# Patient Record
Sex: Male | Born: 1950
Health system: Southern US, Community
[De-identification: ages and names within clinical notes are randomized; demographics above are authoritative.]

## PROBLEM LIST (undated history)

## (undated) DIAGNOSIS — C779 Secondary and unspecified malignant neoplasm of lymph node, unspecified: Secondary | ICD-10-CM

## (undated) DIAGNOSIS — Z923 Personal history of irradiation: Secondary | ICD-10-CM

## (undated) DIAGNOSIS — K5903 Drug induced constipation: Secondary | ICD-10-CM

## (undated) DIAGNOSIS — E039 Hypothyroidism, unspecified: Secondary | ICD-10-CM

## (undated) DIAGNOSIS — C01 Malignant neoplasm of base of tongue: Secondary | ICD-10-CM

## (undated) DIAGNOSIS — G4700 Insomnia, unspecified: Principal | ICD-10-CM

## (undated) DIAGNOSIS — F17200 Nicotine dependence, unspecified, uncomplicated: Secondary | ICD-10-CM

## (undated) DIAGNOSIS — I10 Essential (primary) hypertension: Secondary | ICD-10-CM

## (undated) DIAGNOSIS — B370 Candidal stomatitis: Secondary | ICD-10-CM

## (undated) DIAGNOSIS — B3781 Candidal esophagitis: Secondary | ICD-10-CM

## (undated) DIAGNOSIS — C069 Malignant neoplasm of mouth, unspecified: Secondary | ICD-10-CM

## (undated) DIAGNOSIS — I890 Lymphedema, not elsewhere classified: Principal | ICD-10-CM

## (undated) HISTORY — DX: Candidal stomatitis: B37.0

## (undated) HISTORY — DX: Candidal esophagitis: B37.81

## (undated) HISTORY — PX: MANDIBLE SURGERY: SHX707

## (undated) HISTORY — DX: Lymphedema, not elsewhere classified: I89.0

## (undated) HISTORY — DX: Malignant neoplasm of base of tongue: C01

## (undated) HISTORY — DX: Malignant neoplasm of mouth, unspecified: C06.9

## (undated) HISTORY — PX: APPENDECTOMY: SHX54

## (undated) HISTORY — PX: TESTICLE SURGERY: SHX794

## (undated) HISTORY — DX: Hypomagnesemia: E83.42

## (undated) HISTORY — DX: Insomnia, unspecified: G47.00

## (undated) HISTORY — PX: OTHER SURGICAL HISTORY: SHX169

## (undated) HISTORY — PX: BACK SURGERY: SHX140

## (undated) HISTORY — PX: TONSILLECTOMY: SUR1361

## (undated) HISTORY — DX: Secondary and unspecified malignant neoplasm of lymph node, unspecified: C77.9

---

## 2013-09-02 ENCOUNTER — Telehealth: Payer: Self-pay | Admitting: *Deleted

## 2013-09-02 NOTE — Telephone Encounter (Signed)
Patient called wanting address for appointment on next Tuesday for orange card. Provided patient with address. Alverda Skeans, RN

## 2013-09-07 ENCOUNTER — Ambulatory Visit: Payer: Medicaid Other | Attending: Internal Medicine

## 2013-09-13 ENCOUNTER — Ambulatory Visit: Payer: Medicaid Other | Attending: Internal Medicine

## 2013-09-17 ENCOUNTER — Encounter (HOSPITAL_COMMUNITY): Payer: Self-pay | Admitting: Emergency Medicine

## 2013-09-17 ENCOUNTER — Emergency Department (HOSPITAL_COMMUNITY): Payer: Medicaid Other

## 2013-09-17 ENCOUNTER — Emergency Department (HOSPITAL_COMMUNITY)
Admission: EM | Admit: 2013-09-17 | Discharge: 2013-09-17 | Disposition: A | Discharge status: Home / Self Care | Payer: Medicaid Other | Attending: Emergency Medicine | Admitting: Emergency Medicine

## 2013-09-17 ENCOUNTER — Ambulatory Visit: Payer: Self-pay | Attending: Internal Medicine | Admitting: Internal Medicine

## 2013-09-17 VITALS — BP 148/90 | HR 78 | Temp 99.2°F | Resp 16 | Wt 115.8 lb

## 2013-09-17 DIAGNOSIS — F17200 Nicotine dependence, unspecified, uncomplicated: Secondary | ICD-10-CM | POA: Insufficient documentation

## 2013-09-17 DIAGNOSIS — C01 Malignant neoplasm of base of tongue: Secondary | ICD-10-CM | POA: Insufficient documentation

## 2013-09-17 DIAGNOSIS — R220 Localized swelling, mass and lump, head: Secondary | ICD-10-CM | POA: Insufficient documentation

## 2013-09-17 DIAGNOSIS — R221 Localized swelling, mass and lump, neck: Secondary | ICD-10-CM

## 2013-09-17 DIAGNOSIS — D4989 Neoplasm of unspecified behavior of other specified sites: Secondary | ICD-10-CM | POA: Insufficient documentation

## 2013-09-17 DIAGNOSIS — C760 Malignant neoplasm of head, face and neck: Secondary | ICD-10-CM

## 2013-09-17 LAB — BASIC METABOLIC PANEL
BUN: 14 mg/dL (ref 6–23)
CO2: 25 mEq/L (ref 19–32)
Calcium: 9.2 mg/dL (ref 8.4–10.5)
Chloride: 101 mEq/L (ref 96–112)
Creatinine, Ser: 0.66 mg/dL (ref 0.50–1.35)
GFR calc Af Amer: >90 mL/min (ref >90)
GFR calc non Af Amer: >90 mL/min (ref >90)
Glucose, Bld: 121 mg/dL — ABNORMAL HIGH (ref 70–99)
Potassium: 4.2 mEq/L (ref 3.7–5.3)
Sodium: 137 mEq/L (ref 137–147)

## 2013-09-17 LAB — CBC WITH DIFFERENTIAL/PLATELET
Basophils Absolute: 0.1 10*3/uL (ref 0.0–0.1)
Basophils Relative: 1 % (ref 0–1)
Eosinophils Absolute: 0.4 10*3/uL (ref 0.0–0.7)
Eosinophils Relative: 4 % (ref 0–5)
HCT: 37.9 % — ABNORMAL LOW (ref 39.0–52.0)
Hemoglobin: 12.5 g/dL — ABNORMAL LOW (ref 13.0–17.0)
Lymphocytes Relative: 17 % (ref 12–46)
Lymphs Abs: 1.7 10*3/uL (ref 0.7–4.0)
MCH: 33.3 pg (ref 26.0–34.0)
MCHC: 33 g/dL (ref 30.0–36.0)
MCV: 101.1 fL — ABNORMAL HIGH (ref 78.0–100.0)
Monocytes Absolute: 0.8 10*3/uL (ref 0.1–1.0)
Monocytes Relative: 8 % (ref 3–12)
Neutro Abs: 7.3 10*3/uL (ref 1.7–7.7)
Neutrophils Relative %: 70 % (ref 43–77)
Platelets: 425 10*3/uL — ABNORMAL HIGH (ref 150–400)
RBC: 3.75 MIL/uL — ABNORMAL LOW (ref 4.22–5.81)
RDW: 16 % — ABNORMAL HIGH (ref 11.5–15.5)
WBC: 10.3 10*3/uL (ref 4.0–10.5)

## 2013-09-17 MED ORDER — SODIUM CHLORIDE 0.9 % IV SOLN
INTRAVENOUS | Status: DC
  Administered 2013-09-17: 12:00:00 via INTRAVENOUS

## 2013-09-17 MED ORDER — HYDROCODONE-ACETAMINOPHEN 5-325 MG PO TABS: 1.0000 | ORAL_TABLET | ORAL | Status: DC | PRN

## 2013-09-17 MED ORDER — MORPHINE SULFATE 4 MG/ML IJ SOLN
6.0000 mg | Freq: Once | INTRAMUSCULAR | Status: AC
  Administered 2013-09-17: 6 mg via INTRAVENOUS
  Filled 2013-09-17: qty 2

## 2013-09-17 MED ORDER — IOHEXOL 300 MG/ML  SOLN
100.0000 mL | Freq: Once | INTRAMUSCULAR | Status: AC | PRN
  Administered 2013-09-17: 100 mL via INTRAVENOUS

## 2013-09-17 MED ORDER — ONDANSETRON HCL 4 MG/2ML IJ SOLN
4.0000 mg | Freq: Once | INTRAMUSCULAR | Status: AC
  Administered 2013-09-17: 4 mg via INTRAVENOUS
  Filled 2013-09-17: qty 2

## 2013-09-17 NOTE — Progress Notes (Signed)
Patient here to establish care Complains of having a growth to the left side of his neck  That started about 2 months ago and has gotten progressively worse Also complains of having a smaller lump to the right side of his neck

## 2013-09-17 NOTE — Progress Notes (Signed)
Patient ID: Angel [name], male   DOB: Apr 13, 1951, 63 y.o.   MRN: [id number]   Angel [name], is a 63 y.o. male  DVV:[id number]  GYI:[id number]  DOB - [birth year]  CC:  Chief Complaint  Patient presents with  . Establish Care       HPI: Angel [name] is a 63 y.o. male here today to establish medical care. His major complaint today is left-sided neck mass which he noticed first 3 months ago, has been getting progressively larger since then and fast growing in the last 2 weeks. It does not hurt to the touch. Is getting large enough to the point that he began having some mass effect and causing more discomfort. For the past 2 weeks he states that had a sore throat. Sometimes he feels like his saliva gets hung up when he tries to swallow. No difficulty breathing. No stridor. He has smoked cigarettes heavily since age 59, currently smoking about 1 pack per day. No alcohol use. No tongue pain. No fevers or chills. No night sweats. He denies weight loss today he has always been around 120 pounds all his life.  Patient has No headache, No chest pain, No abdominal pain - No Nausea, No new weakness tingling or numbness, No Cough - SOB.  No Known Allergies History reviewed. No pertinent past medical history. No current outpatient prescriptions on file prior to visit.   No current facility-administered medications on file prior to visit.   History reviewed. No pertinent family history. History   Social History  . Marital Status: Legally Separated    Spouse Name: N/A    Number of Children: N/A  . Years of Education: N/A   Occupational History  . Not on file.   Social History Main Topics  . Smoking status: Heavy Tobacco Smoker  . Smokeless tobacco: Not on file  . Alcohol Use: No  . Drug Use: Not on file  . Sexual Activity: Not on file   Other Topics Concern  . Not on file   Social History Narrative  . No narrative on file    Review of Systems: Constitutional: Negative for fever,  chills, diaphoresis, activity change, appetite change and fatigue. HENT: Negative for ear pain, nosebleeds, congestion, positive for neck pain and neck swelling as described above  Eyes: Negative for pain, discharge, redness, itching and visual disturbance. Respiratory: Negative for cough, choking, chest tightness, shortness of breath, wheezing and stridor.  Cardiovascular: Negative for chest pain, palpitations and leg swelling. Gastrointestinal: Negative for abdominal distention. Genitourinary: Negative for dysuria, urgency, frequency, hematuria, flank pain, decreased urine volume, difficulty urinating and dyspareunia.  Musculoskeletal: Negative for back pain, joint swelling, arthralgia and gait problem. Neurological: Negative for dizziness, tremors, seizures, syncope, facial asymmetry, speech difficulty, weakness, light-headedness, numbness and headaches.  Hematological: Negative for adenopathy. Does not bruise/bleed easily. Psychiatric/Behavioral: Negative for hallucinations, behavioral problems, confusion, dysphoric mood, decreased concentration and agitation.    Objective:   Filed Vitals:   09/17/13 1037  BP: 148/90  Pulse: 78  Temp: 99.2 F (37.3 C)  Resp: 16    Physical Exam: Constitutional: Patient appears well-developed and well-nourished. No distress. Eyes: Conjunctivae and EOM are normal. PERRLA, no scleral icterus.  Neck: There is a huge L lateral neck mass about 8 x 10 cm. Below level of mandible. Not attached to skin, Mostly firm to touch, there is a small area nonhealing ulcer/abrasion in the lower lateral part, fluctuant. Nontender. No drainage. No surrounding erythema. Smaller firm and nontender mass in  same area on R side. Overlying scar which pt reports is from previous mandible fx. Posterior pharynx clear. Uvula midline. Handling secretions. No intraoral lesions noted. No stridor. Normal sounding phonation.   CVS: RRR, S1/S2 +, no murmurs, no gallops, no carotid  bruit.  Pulmonary: Effort and breath sounds normal, no stridor, rhonchi, wheezes, rales.  Abdominal: Soft. BS +, no distension, tenderness, rebound or guarding.  Musculoskeletal: Normal range of motion. No edema and no tenderness.  Lymphadenopathy: No lymphadenopathy noted, cervical, inguinal or axillary Neuro: Alert. Normal reflexes, muscle tone coordination. No cranial nerve deficit. Skin: Skin is warm and dry. No rash noted. Not diaphoretic. No erythema. No pallor. Psychiatric: Normal mood and affect. Behavior, judgment, thought content normal.  Lab Results  Component Value Date   WBC 10.3 09/17/2013   HGB 12.5* 09/17/2013   HCT 37.9* 09/17/2013   MCV 101.1* 09/17/2013   PLT 425* 09/17/2013   Lab Results  Component Value Date   CREATININE 0.66 09/17/2013   BUN 14 09/17/2013   NA 137 09/17/2013   K 4.2 09/17/2013   CL 101 09/17/2013   CO2 25 09/17/2013    No results found for this basename: HGBA1C   Lipid Panel  No results found for this basename: chol, trig, hdl, cholhdl, vldl, ldlcalc       Assessment and plan:   1. Head and neck malignancy Patient has no insurance and no financial means of paying co-pay, so referral to surgery for biopsy is difficult at the moment Patient needs imaging studies to characterize the neck mass, with the patient to the emergency department for immediate imaging studies, and possible consult to ENT surgery or general surgery for possible biopsy or at least a scheduled outpatient follow up with the specialist for biopsy  Patient was counseled extensively about smoking cessation  Return if symptoms worsen or fail to improve, for Follow up Pain and comorbidities.  The patient was given clear instructions to go to ER or return to medical center if symptoms don't improve, worsen or new problems develop. The patient verbalized understanding. The patient was told to call to get lab results if they haven't heard anything in the next week.     This note has  been created with Surveyor, quantity. Any transcriptional errors are unintentional.    Angelica Chessman, MD, MHA, [place], [place], [place]   09/17/2013, 4:56 PM

## 2013-09-17 NOTE — ED Notes (Signed)
MD at bedside. 

## 2013-09-17 NOTE — Discharge Instructions (Signed)
Cancer of the Tongue Cancer of the tongue occurs when a group of cells on the tongue become abnormal and start to grow out of control. Most of the time, tongue cancer starts in very thin, flat cells that cover the surface of your tongue (squamous cells). Cancer cells can spread and form a mass of cells called a tumor. The tumor may spread deeper into the tongue, or it may spread to other areas of the body (metastasize). RISK FACTORS The exact cause of cancer of the tongue is not known. However, some risk factors make this more likely:  Use of tobacco products, including cigarettes, pipes, cigars, smokeless (chewing) tobacco, and snuff. This is the number one risk factor of cancer of the tongue.  Male gender.  Age of 50 years or older.  Poor oral hygiene (not brushing or flossing your teeth regularly).  Frequent use of alcohol.  Human papillomavirus (HPV) infection.  Family history of tongue cancer. SYMPTOMS Tongue cancer can start in 1 of 2 places. It can start at the front part of the tongue or at the base of the tongue at the back of your throat.Symptoms of cancer at the front part of your tongue may include:  A lump or sore on your tongue that may be painful (especially when you eat or speak) and may not heal. It also may bleed easily if you bite it or touch it.  Numbness on your tongue.  Difficulty moving your tongue.  Pain when chewing.  Difficulty pronouncing or saying certain words or making certain sounds.  A bad odor in your mouth.  A lump on your neck. Cancer at the base of the tongue is harder to see. Symptoms may not show up as soon as they do for cancer at the front part of your tongue. Symptoms may include:  A feeling in your throat that you are choking (especially when you are lying down).  Difficulty swallowing or pain when you swallow.  A muffled voice.  Ear pain.  Pain when you try to open your  mouth or difficulty opening your mouth.  A lump on your neck. DIAGNOSIS To diagnose tongue cancer, your caregiver may perform the following exams:  A physical exam of your mouth, throat, and neck for a sore or lump. Your caregiver may use a mirror with a long handle or a thin, flexible tube with a tiny light and camera at the end (fiberscope) to see the back of your mouth.  Removal and exam of a small number of cells (biopsy) from your tongue or a lump on your neck. The cells are checked for cancerous formations under a microscope.  Imaging exams, such as X-rays of your mouth and neck. The images can show if there is an abnormal mass. If you do have cancer, your caregiver will stage your cancer. Staging provides an idea of how advanced your cancer is. The stage will depend on how much your cancer has grown and if it has metastasized. The meaning of the stage depends on the type of cancer. For tongue cancer:  Stage I means the cancer is the size of a peanut or smaller. It has not metastasized.  Stage II means the cancer is larger than a peanut, but not larger than a walnut. It has not metastasized.  Stage III means the cancer has grown larger than a walnut. It may have spread to a lymph node or lymph gland on the same side of your neck as the cancer. (Lymph is a   fluid that carries white blood cells all over your body. White blood cells fight infection.)  Stage IV means the cancer has spread to nearby areas. It may have spread heavily into your lymph glands. TREATMENT Treatment for tongue cancer can vary. It will depend on the stage and location of your cancer and your overall health. Treatment options may include:  Radiation therapy. This uses waves of nuclear energy to kill cancer cells on your tongue. It may be used for stage I and II cancers. It often is used for cancers at the base of your tongue.  Surgery:  Surgery is done if your tumor is small, has not spread, and is at the front of  your tongue.  Surgery may be done to remove tumors that have spread into your neck or lymph nodes.  A combination of surgery, radiation, and drugs that kills cancer cells (chemotherapy). This may be done for stage III and stage IV cancers. SEEK MEDICAL CARE IF:  Your tongue hurts or is numb.  The way you speak changes.  The way you swallow changes.  You notice a lump on your neck.  You have an oral temperature above 100.5 F (38.1 C). SEEK IMMEDIATE MEDICAL CARE IF:   You have pain that gets worse.  Your tongue or mouth bleeds.  Your lip, mouth, or neck swells.  You have trouble swallowing.  You have trouble breathing.  You have an oral temperature above 102 F (38.9 C). Document Released: 07/31/2010 Document Revised: 07/08/2011 Document Reviewed: 07/31/2010 Exit[employer] Patient Information 2014 [employer].  

## 2013-09-17 NOTE — ED Notes (Signed)
Per pt sts he has had a growth on the left side of his neck since February. sts it is not painful. sts he feels tired and head is heavy at night. sts his throat has also been very sore. sts some occasional trouble swallowing.

## 2013-09-17 NOTE — ED Provider Notes (Signed)
CSN: [id number]     Arrival date & time 09/17/13  1115 History   First MD Initiated Contact with Patient 09/17/13 1129     Chief Complaint  Patient presents with  . Sore Throat  . Oral Swelling     (Consider location/radiation/quality/duration/timing/severity/associated sxs/prior Treatment) HPI  63 year old male with left-sided neck mass. First dose in February. Has been getting progressively larger since then. Does not hurt to the touch. Is getting large enough to the point that this began having some mass effect and causing more discomfort though. For the past 2 weeks he states that had a sore throat. Sometimes he feels like his saliva gets hung up when he tries to swallow. No difficulty breathing. No stridor. Does smoke cigarettes. No alcohol use.  No tongue pain. No fevers or chills. No night sweats. No weight-loss.  History reviewed. No pertinent past medical history. History reviewed. No pertinent past surgical history. History reviewed. No pertinent family history. History  Substance Use Topics  . Smoking status: Heavy Tobacco Smoker  . Smokeless tobacco: Not on file  . Alcohol Use: No    Review of Systems  All systems reviewed and negative, other than as noted in HPI.   Allergies  Review of patient's allergies indicates no known allergies.  Home Medications   Prior to Admission medications   Medication Sig Start Date End Date Taking? Authorizing Provider  acetaminophen (TYLENOL) 500 MG tablet Take 1,000 mg by mouth 3 (three) times daily as needed (for pain).   Yes Historical Provider, MD   BP 140/81  Pulse 67  Temp(Src) 98.1 F (36.7 C) (Oral)  Resp 16  Wt 115 lb 9.6 oz (52.436 kg)  SpO2 100% Physical Exam  Nursing note and vitals reviewed. Constitutional: He appears well-developed and well-nourished. No distress.  HENT:  Head:    Large L lateral neck mass. Below level of mandible. Mostly firm to touch, but small area inferior/posterior aspect somewhat  fluctuant. Nontender. No drainage. No surrounding cellulitis. Smaller firm and nontender mass in same area on R side. Overlying scar which pt reports is from previous mandible fx. Posterior pharynx clear. Uvula midline. Handling secretions. No intraoral lesions noted. No stridor. Normal sounding phonation.  Eyes: Conjunctivae are normal. Right eye exhibits no discharge. Left eye exhibits no discharge.  Neck: Neck supple.  Cardiovascular: Normal rate, regular rhythm and normal heart sounds.  Exam reveals no gallop and no friction rub.   No murmur heard. Pulmonary/Chest: Effort normal and breath sounds normal. No respiratory distress.  Abdominal: Soft. He exhibits no distension. There is no tenderness.  Musculoskeletal: He exhibits no edema and no tenderness.  Neurological: He is alert.  Skin: Skin is warm and dry.  Psychiatric: He has a normal mood and affect. His behavior is normal. Thought content normal.    ED Course  Procedures (including critical care time) Labs Review Labs Reviewed  CBC WITH DIFFERENTIAL - Abnormal; Notable for the following:    RBC 3.75 (*)    Hemoglobin 12.5 (*)    HCT 37.9 (*)    MCV 101.1 (*)    RDW 16.0 (*)    Platelets 425 (*)    All other components within normal limits  BASIC METABOLIC PANEL - Abnormal; Notable for the following:    Glucose, Bld 121 (*)    All other components within normal limits    Imaging Review Ct Soft Tissue Neck W Contrast  09/17/2013   CLINICAL DATA:  Left neck mass.  EXAM: CT  NECK WITH CONTRAST  TECHNIQUE: Multidetector CT imaging of the neck was performed using the standard protocol following the bolus administration of intravenous contrast.  CONTRAST:  128mL OMNIPAQUE IOHEXOL 300 MG/ML  SOLN  COMPARISON:  None.  FINDINGS: The visualized portion of the brain demonstrates cerebral atrophy orbits are unremarkable there is trace right maxillary sinus mucosal thickening. Mastoid air cells are clear.  There is a large, centrally  necrotic mass deep to the left sternocleidomastoid spanning levels II and III and measuring 6.6 x 5.5 x 6.2 cm (AP by transverse by craniocaudal). Finding is compatible with a necrotic nodal mass. Necrotic left level II lymph node superior to this dominant mass measures 2.5 x 2.2 cm. Necrotic right level II lymph nodes measure up to 2.2 x 1.9 cm in size. Right level III lymph nodes measure up to 6 mm and short axis. A mildly asymmetric left supraclavicular lymph node measures 6 mm.  There is a large enhancing mass in the tongue base measuring 3.7 x 3.5 x 2.9 cm (transverse by AP by craniocaudal). Mass extends into the vallecula and superior aspect of the pre epiglottic space.  The thyroid is unremarkable. There is prominent, diffuse enhancement of the salivary glands and the lymphoid tissue of the neck. No submandibular or parotid mass is identified. Visualized lung apices demonstrate very mild scarring as well as a 3 mm nodule in the apical right upper lobe (series 4, image 98). There is moderate to advanced multilevel degenerative disc disease in the cervical spine. Moderate facet arthrosis is present on the left at C3-4.  IMPRESSION: Large tongue base malignancy with necrotic, bulky metastatic cervical lymphadenopathy, left greater than right.   Electronically Signed   By: Logan Bores   On: 09/17/2013 14:42     EKG Interpretation None      MDM   Final diagnoses:  Cancer of base of tongue    63 year old male with a large left lateral neck mass. Consider abscess. Long smoking hx. Consider malignancy. Interestingly, pt also has a R sided neck mass which he reports he's had since childhood, although increased in size recently. Will CT to further evaluate.   CT as above. Will discuss with ENT.   Discussed with Dr. Redmond Baseman, ENT. Will see patient in close followup. Patient is without any respiratory complaints. No objective evidence of airway compromise. I feel he is appropriate for outpatient followup  at this time. Emergent return precautions were discussed. Provided a prescription for as needed pain medication.  Virgel Manifold, MD 09/17/13 [phone]

## 2013-09-17 NOTE — ED Notes (Signed)
Pt has large swollen growth on L neck located under L ear. States it has been growing in size for about 2.5 months. States he has small bump on R neck that has "been there as long as I can remember, I'm not sure if it's related". Pt states he has had sore throat x 3 weeks and occasional trouble swallowing.

## 2013-09-23 ENCOUNTER — Other Ambulatory Visit (HOSPITAL_COMMUNITY)
Admission: RE | Admit: 2013-09-23 | Discharge: 2013-09-23 | Disposition: A | Discharge status: Home / Self Care | Payer: Medicaid Other | Source: Ambulatory Visit | Attending: Otolaryngology | Admitting: Otolaryngology

## 2013-09-23 ENCOUNTER — Other Ambulatory Visit: Payer: Self-pay | Admitting: Otolaryngology

## 2013-09-23 DIAGNOSIS — C760 Malignant neoplasm of head, face and neck: Secondary | ICD-10-CM | POA: Insufficient documentation

## 2013-09-28 NOTE — Pre-Procedure Instructions (Signed)
Angel [name]  09/28/2013   Your procedure is scheduled on:  Monday October 04, 2013 at 11:00 AM.  Report to [facility] Admitting at 9:00 AM.  Call this number if you have problems the morning of surgery: [phone]   Remember:   Do not eat food or drink liquids after midnight.   Take these medicines the morning of surgery with A SIP OF WATER: Acetaminophen (Tylenol) if needed, Hydrocodone if needed   Do not wear jewelry.  Do not wear lotions, powders, or cologne.   Men may shave face and neck.  Do not bring valuables to the hospital.  [facility] is not responsible for any belongings or valuables.               Contacts, dentures or bridgework may not be worn into surgery.  Leave suitcase in the car. After surgery it may be brought to your room.  For patients admitted to the hospital, discharge time is determined by your treatment team.               Patients discharged the day of surgery will not be allowed to drive home.  Name and phone number of your driver: Family/Friend  Special Instructions: Shower the night before and the morning of your surgery using CHG soap   Please read over the following fact sheets that you were given: Pain Booklet, Coughing and Deep Breathing and Surgical Site Infection Prevention

## 2013-09-29 ENCOUNTER — Encounter (HOSPITAL_COMMUNITY): Payer: Self-pay

## 2013-09-29 ENCOUNTER — Encounter (HOSPITAL_COMMUNITY)
Admission: RE | Admit: 2013-09-29 | Discharge: 2013-09-29 | Disposition: A | Discharge status: Home / Self Care | Payer: Medicaid Other | Source: Ambulatory Visit | Attending: Otolaryngology | Admitting: Otolaryngology

## 2013-09-29 DIAGNOSIS — Z01812 Encounter for preprocedural laboratory examination: Secondary | ICD-10-CM | POA: Insufficient documentation

## 2013-09-29 HISTORY — DX: Nicotine dependence, unspecified, uncomplicated: F17.200

## 2013-09-29 HISTORY — DX: Drug induced constipation: K59.03

## 2013-09-29 LAB — COMPREHENSIVE METABOLIC PANEL
ALT: 6 U/L (ref 0–53)
AST: 11 U/L (ref 0–37)
Albumin: 3.4 g/dL — ABNORMAL LOW (ref 3.5–5.2)
Alkaline Phosphatase: 70 U/L (ref 39–117)
BUN: 12 mg/dL (ref 6–23)
CO2: 27 mEq/L (ref 19–32)
CREATININE: 0.64 mg/dL (ref 0.50–1.35)
Calcium: 9.8 mg/dL (ref 8.4–10.5)
Chloride: 98 mEq/L (ref 96–112)
GFR calc Af Amer: >90 mL/min (ref >90)
GFR calc non Af Amer: >90 mL/min (ref >90)
Glucose, Bld: 111 mg/dL — ABNORMAL HIGH (ref 70–99)
Potassium: 5.1 mEq/L (ref 3.7–5.3)
Sodium: 134 mEq/L — ABNORMAL LOW (ref 137–147)
TOTAL PROTEIN: 7.3 g/dL (ref 6.0–8.3)
Total Bilirubin: <0.2 mg/dL — ABNORMAL LOW (ref 0.3–1.2)

## 2013-09-29 LAB — CBC
HCT: 37 % — ABNORMAL LOW (ref 39.0–52.0)
Hemoglobin: 12.6 g/dL — ABNORMAL LOW (ref 13.0–17.0)
MCH: 34.1 pg — AB (ref 26.0–34.0)
MCHC: 34.1 g/dL (ref 30.0–36.0)
MCV: 100.3 fL — ABNORMAL HIGH (ref 78.0–100.0)
Platelets: 505 10*3/uL — ABNORMAL HIGH (ref 150–400)
RBC: 3.69 MIL/uL — ABNORMAL LOW (ref 4.22–5.81)
RDW: 16.1 % — ABNORMAL HIGH (ref 11.5–15.5)
WBC: 16.6 10*3/uL — ABNORMAL HIGH (ref 4.0–10.5)

## 2013-09-29 NOTE — Progress Notes (Signed)
Patient informed Nurse that he was last seen by his PCP approximately 5 years ago. Patient informed Nurse that he smoked 2 PPD but has cut it down to 2-3 cigarettes a day. Patient denied having any cardiac, pulmonary, chewing, or swallowing difficulties.
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
## 2013-10-01 ENCOUNTER — Telehealth: Payer: Self-pay | Admitting: Hematology and Oncology

## 2013-10-01 ENCOUNTER — Other Ambulatory Visit (HOSPITAL_COMMUNITY): Payer: Self-pay | Admitting: Otolaryngology

## 2013-10-01 NOTE — Telephone Encounter (Signed)
S/W PATIENT AND GAVE NP APPT FOR 06/09 @ 3 W/DR. [place].  REFERRING DR. Orpah Greek BATES DX- TONGUE BASE CANCER/LEFT NECK SCC WELCOME PACKET MAILED.

## 2013-10-04 ENCOUNTER — Encounter (HOSPITAL_COMMUNITY): Payer: Medicaid Other | Admitting: Vascular Surgery

## 2013-10-04 ENCOUNTER — Encounter (HOSPITAL_COMMUNITY): Payer: Self-pay | Admitting: *Deleted

## 2013-10-04 ENCOUNTER — Ambulatory Visit: Payer: Self-pay

## 2013-10-04 ENCOUNTER — Ambulatory Visit (HOSPITAL_COMMUNITY): Payer: Medicaid Other | Admitting: Anesthesiology

## 2013-10-04 ENCOUNTER — Ambulatory Visit (HOSPITAL_COMMUNITY)
Admission: RE | Admit: 2013-10-04 | Discharge: 2013-10-04 | Disposition: A | Discharge status: Home / Self Care | Payer: Medicaid Other | Source: Ambulatory Visit | Attending: Otolaryngology | Admitting: Otolaryngology

## 2013-10-04 ENCOUNTER — Ambulatory Visit: Payer: Self-pay | Admitting: Hematology and Oncology

## 2013-10-04 ENCOUNTER — Encounter (HOSPITAL_COMMUNITY)
Admission: RE | Disposition: A | Discharge status: Home / Self Care | Payer: Self-pay | Source: Ambulatory Visit | Attending: Otolaryngology

## 2013-10-04 DIAGNOSIS — C50919 Malignant neoplasm of unspecified site of unspecified female breast: Secondary | ICD-10-CM | POA: Diagnosis not present

## 2013-10-04 DIAGNOSIS — C01 Malignant neoplasm of base of tongue: Secondary | ICD-10-CM | POA: Diagnosis not present

## 2013-10-04 DIAGNOSIS — F17200 Nicotine dependence, unspecified, uncomplicated: Secondary | ICD-10-CM | POA: Diagnosis not present

## 2013-10-04 DIAGNOSIS — C069 Malignant neoplasm of mouth, unspecified: Secondary | ICD-10-CM

## 2013-10-04 DIAGNOSIS — C760 Malignant neoplasm of head, face and neck: Secondary | ICD-10-CM

## 2013-10-04 DIAGNOSIS — C779 Secondary and unspecified malignant neoplasm of lymph node, unspecified: Secondary | ICD-10-CM

## 2013-10-04 HISTORY — PX: PANENDOSCOPY: SHX2159

## 2013-10-04 HISTORY — DX: Malignant neoplasm of mouth, unspecified: C06.9

## 2013-10-04 HISTORY — PX: ESOPHAGOSCOPY: SHX5534

## 2013-10-04 SURGERY — LARYNGOSCOPY, WITH BRONCHOSCOPY AND ESOPHAGOSCOPY
Anesthesia: General | Site: Mouth

## 2013-10-04 MED ORDER — 0.9 % SODIUM CHLORIDE (POUR BTL) OPTIME
TOPICAL | Status: DC | PRN
  Administered 2013-10-04: 1000 mL

## 2013-10-04 MED ORDER — FENTANYL CITRATE 0.05 MG/ML IJ SOLN
25.0000 ug | INTRAMUSCULAR | Status: DC | PRN
  Administered 2013-10-04 (×2): 50 ug via INTRAVENOUS

## 2013-10-04 MED ORDER — CEFAZOLIN SODIUM-DEXTROSE 2-3 GM-% IV SOLR
2.0000 g | Freq: Once | INTRAVENOUS | Status: AC
  Administered 2013-10-04: 2 g via INTRAVENOUS
  Filled 2013-10-04: qty 50

## 2013-10-04 MED ORDER — METOCLOPRAMIDE HCL 5 MG/ML IJ SOLN: 10.0000 mg | Freq: Once | INTRAMUSCULAR | Status: DC | PRN

## 2013-10-04 MED ORDER — PROPOFOL 10 MG/ML IV BOLUS
INTRAVENOUS | Status: AC
  Filled 2013-10-04: qty 20

## 2013-10-04 MED ORDER — ROCURONIUM BROMIDE 50 MG/5ML IV SOLN
INTRAVENOUS | Status: AC
  Filled 2013-10-04: qty 1

## 2013-10-04 MED ORDER — MIDAZOLAM HCL 2 MG/2ML IJ SOLN
INTRAMUSCULAR | Status: AC
  Filled 2013-10-04: qty 2

## 2013-10-04 MED ORDER — PROPOFOL 10 MG/ML IV BOLUS
INTRAVENOUS | Status: DC | PRN
  Administered 2013-10-04: 160 mg via INTRAVENOUS
  Administered 2013-10-04: 40 mg via INTRAVENOUS

## 2013-10-04 MED ORDER — LACTATED RINGERS IV SOLN
INTRAVENOUS | Status: DC
  Administered 2013-10-04: 09:00:00 via INTRAVENOUS

## 2013-10-04 MED ORDER — ARTIFICIAL TEARS OP OINT
TOPICAL_OINTMENT | OPHTHALMIC | Status: AC
  Filled 2013-10-04: qty 3.5

## 2013-10-04 MED ORDER — OXYCODONE HCL 5 MG PO TABS
5.0000 mg | ORAL_TABLET | Freq: Once | ORAL | Status: AC | PRN
  Administered 2013-10-04: 5 mg via ORAL

## 2013-10-04 MED ORDER — DEXAMETHASONE SODIUM PHOSPHATE 4 MG/ML IJ SOLN
INTRAMUSCULAR | Status: DC | PRN
  Administered 2013-10-04: 4 mg via INTRAVENOUS

## 2013-10-04 MED ORDER — LIDOCAINE HCL (CARDIAC) 20 MG/ML IV SOLN
INTRAVENOUS | Status: DC | PRN
  Administered 2013-10-04: 160 mg via INTRAVENOUS

## 2013-10-04 MED ORDER — SUCCINYLCHOLINE CHLORIDE 20 MG/ML IJ SOLN
INTRAMUSCULAR | Status: DC | PRN
  Administered 2013-10-04: 40 mg via INTRAVENOUS
  Administered 2013-10-04: 100 mg via INTRAVENOUS

## 2013-10-04 MED ORDER — LIDOCAINE HCL (CARDIAC) 20 MG/ML IV SOLN
INTRAVENOUS | Status: AC
  Filled 2013-10-04: qty 5

## 2013-10-04 MED ORDER — ONDANSETRON HCL 4 MG/2ML IJ SOLN
INTRAMUSCULAR | Status: DC | PRN
Start: 2013-10-04 — End: 2013-10-04
  Administered 2013-10-04: 4 mg via INTRAVENOUS

## 2013-10-04 MED ORDER — FENTANYL CITRATE 0.05 MG/ML IJ SOLN
INTRAMUSCULAR | Status: AC
  Filled 2013-10-04: qty 2

## 2013-10-04 MED ORDER — DEXAMETHASONE SODIUM PHOSPHATE 4 MG/ML IJ SOLN
INTRAMUSCULAR | Status: AC
  Filled 2013-10-04: qty 1

## 2013-10-04 MED ORDER — HYDROCODONE-ACETAMINOPHEN 5-325 MG PO TABS: 1.0000 | ORAL_TABLET | Freq: Four times a day (QID) | ORAL | Status: DC | PRN

## 2013-10-04 MED ORDER — FENTANYL CITRATE 0.05 MG/ML IJ SOLN
INTRAMUSCULAR | Status: AC
  Filled 2013-10-04: qty 5

## 2013-10-04 MED ORDER — OXYCODONE HCL 5 MG PO TABS
ORAL_TABLET | ORAL | Status: AC
  Filled 2013-10-04: qty 1

## 2013-10-04 MED ORDER — MIDAZOLAM HCL 5 MG/5ML IJ SOLN
INTRAMUSCULAR | Status: DC | PRN
  Administered 2013-10-04: 2 mg via INTRAVENOUS

## 2013-10-04 MED ORDER — OXYCODONE HCL 5 MG/5ML PO SOLN: 5.0000 mg | Freq: Once | ORAL | Status: AC | PRN

## 2013-10-04 MED ORDER — FENTANYL CITRATE 0.05 MG/ML IJ SOLN
INTRAMUSCULAR | Status: DC | PRN
  Administered 2013-10-04: 50 ug via INTRAVENOUS

## 2013-10-04 MED ORDER — ONDANSETRON HCL 4 MG/2ML IJ SOLN
INTRAMUSCULAR | Status: AC
  Filled 2013-10-04: qty 2

## 2013-10-04 SURGICAL SUPPLY — 32 items
BLADE 10 SAFETY STRL DISP (BLADE) ×4 IMPLANT
CANISTER SUCTION 2500CC (MISCELLANEOUS) ×4 IMPLANT
CONT SPEC 4OZ CLIKSEAL STRL BL (MISCELLANEOUS) IMPLANT
COVER MAYO STAND STRL (DRAPES) ×2 IMPLANT
COVER TABLE BACK 60X90 (DRAPES) ×4 IMPLANT
CRADLE DONUT ADULT HEAD (MISCELLANEOUS) IMPLANT
DRAPE PROXIMA HALF (DRAPES) ×4 IMPLANT
DRESSING TELFA 8X3 (GAUZE/BANDAGES/DRESSINGS) ×4 IMPLANT
DRSG TELFA 3X8 NADH (GAUZE/BANDAGES/DRESSINGS) ×4 IMPLANT
GAUZE SPONGE 4X4 16PLY XRAY LF (GAUZE/BANDAGES/DRESSINGS) ×2 IMPLANT
GLOVE BIO SURGEON STRL SZ7.5 (GLOVE) ×4 IMPLANT
GLOVE BIOGEL PI IND STRL 7.0 (GLOVE) IMPLANT
GLOVE BIOGEL PI INDICATOR 7.0 (GLOVE) ×2
GLOVE SURG SS PI 7.0 STRL IVOR (GLOVE) ×2 IMPLANT
GOWN STRL REUS W/ TWL LRG LVL3 (GOWN DISPOSABLE) ×2 IMPLANT
GOWN STRL REUS W/TWL LRG LVL3 (GOWN DISPOSABLE) ×4
KIT ROOM TURNOVER OR (KITS) ×4 IMPLANT
MARKER SKIN DUAL TIP RULER LAB (MISCELLANEOUS) IMPLANT
NDL 18GX1X1/2 (RX/OR ONLY) (NEEDLE) ×2 IMPLANT
NEEDLE 18GX1X1/2 (RX/OR ONLY) (NEEDLE) ×4 IMPLANT
NS IRRIG 1000ML POUR BTL (IV SOLUTION) ×4 IMPLANT
PAD ARMBOARD 7.5X6 YLW CONV (MISCELLANEOUS) ×8 IMPLANT
PAD DRESSING TELFA 3X8 NADH (GAUZE/BANDAGES/DRESSINGS) IMPLANT
PATTIES SURGICAL .5 X1 (DISPOSABLE) IMPLANT
SOLUTION ANTI FOG 6CC (MISCELLANEOUS) ×4 IMPLANT
SPECIMEN JAR SMALL (MISCELLANEOUS) IMPLANT
SPONGE GAUZE 4X4 12PLY (GAUZE/BANDAGES/DRESSINGS) ×4 IMPLANT
SURGILUBE 2OZ TUBE FLIPTOP (MISCELLANEOUS) IMPLANT
TOWEL OR 17X24 6PK STRL BLUE (TOWEL DISPOSABLE) ×8 IMPLANT
TUBE CONNECTING 12'X1/4 (SUCTIONS) ×1
TUBE CONNECTING 12X1/4 (SUCTIONS) ×3 IMPLANT
WATER STERILE IRR 1000ML POUR (IV SOLUTION) ×4 IMPLANT

## 2013-10-04 NOTE — Transfer of Care (Signed)
Immediate Anesthesia Transfer of Care Note  Patient: Angel [name]  Procedure(s) Performed: Procedure(s): Direct Laryngoscopy  WITH BIOPSY (N/A) ESOPHAGOSCOPY  Patient Location: PACU  Anesthesia Type:General  Level of Consciousness: awake, alert  and oriented  Airway & Oxygen Therapy: Patient Spontanous Breathing and Patient connected to nasal cannula oxygen  Post-op Assessment: Report given to PACU RN  Post vital signs: Reviewed and stable  Complications: No apparent anesthesia complications

## 2013-10-04 NOTE — Anesthesia Procedure Notes (Signed)
Procedure Name: Intubation Date/Time: 10/04/2013 11:43 AM Performed by: Barrington Ellison Pre-anesthesia Checklist: Patient identified, Emergency Drugs available, Suction available, Patient being monitored and Timeout performed Patient Re-evaluated:Patient Re-evaluated prior to inductionOxygen Delivery Method: Circle system utilized Preoxygenation: Pre-oxygenation with 100% oxygen Intubation Type: IV induction and Rapid sequence Ventilation: Mask ventilation without difficulty Laryngoscope Size: Mac Grade View: Grade I Tube type: Oral Tube size: 7.0 mm Number of attempts: 2 Airway Equipment and Method: Video-laryngoscopy and Stylet Placement Confirmation: ETT inserted through vocal cords under direct vision,  positive ETCO2 and breath sounds checked- equal and bilateral Secured at: 22 cm Tube secured with: Tape Dental Injury: Teeth and Oropharynx as per pre-operative assessment  Comments: DL x1 with Glide Scope by CRNA and Dr. Albertina Parr with no view of epiglottis due to tumor. Dr. Redmond Baseman DL x 1 with Glide, Grade 1 view.

## 2013-10-04 NOTE — Brief Op Note (Signed)
10/04/2013  11:56 AM  PATIENT:  Angel [name]  63 y.o. male  PRE-OPERATIVE DIAGNOSIS:  TONGUE BASE CANCER, NECK METASTASES  POST-OPERATIVE DIAGNOSIS:  SAME  PROCEDURE:  DIRECT LARYNGOSCOPY WITH BIOPSY, ESOPHAGOSCOPY  SURGEON:  Surgeon(s) and Role:    * Melida Quitter, MD - Primary  PHYSICIAN ASSISTANT:   ASSISTANTS: none   ANESTHESIA:   general  EBL:     BLOOD ADMINISTERED:none  DRAINS: none   LOCAL MEDICATIONS USED:  NONE  SPECIMEN:  Source of Specimen:  Left tongue base, vallecula  DISPOSITION OF SPECIMEN:  PATHOLOGY  COUNTS:  YES  TOURNIQUET:  * No tourniquets in log *  DICTATION: .Other Dictation: Dictation Number [id number]  PLAN OF CARE: Discharge to home after PACU  PATIENT DISPOSITION:  PACU - hemodynamically stable.   Delay start of Pharmacological VTE agent (>24hrs) due to surgical blood loss or risk of bleeding: no

## 2013-10-04 NOTE — Anesthesia Preprocedure Evaluation (Addendum)
Anesthesia Evaluation  Patient identified by MRN, date of birth, ID band Patient awake    Reviewed: Allergy & Precautions, H&P , NPO status , Patient's Chart, lab work & pertinent test results, reviewed documented beta blocker date and time   Airway Mallampati: II TM Distance: >3 FB Neck ROM: full    Dental  (+) Partial Upper, Partial Lower, Dental Advisory Given, Poor Dentition   Pulmonary Current Smoker,  breath sounds clear to auscultation        Cardiovascular negative cardio ROS  Rhythm:regular     Neuro/Psych negative neurological ROS  negative psych ROS   GI/Hepatic negative GI ROS, Neg liver ROS,   Endo/Other  negative endocrine ROS  Renal/GU negative Renal ROS  negative genitourinary   Musculoskeletal   Abdominal   Peds  Hematology negative hematology ROS (+)   Anesthesia Other Findings See surgeon's H&P   Reproductive/Obstetrics negative OB ROS                          Anesthesia Physical Anesthesia Plan  ASA: III  Anesthesia Plan: General   Post-op Pain Management:    Induction: Intravenous  Airway Management Planned: Video Laryngoscope Planned and Oral ETT  Additional Equipment:   Intra-op Plan:   Post-operative Plan: Extubation in OR  Informed Consent: I have reviewed the patients History and Physical, chart, labs and discussed the procedure including the risks, benefits and alternatives for the proposed anesthesia with the patient or authorized representative who has indicated his/her understanding and acceptance.   Dental Advisory Given  Plan Discussed with: CRNA and Surgeon  Anesthesia Plan Comments:         Anesthesia Quick Evaluation

## 2013-10-04 NOTE — Discharge Instructions (Signed)

## 2013-10-04 NOTE — Anesthesia Postprocedure Evaluation (Signed)
Anesthesia Post Note  Patient: Angel [name]  Procedure(s) Performed: Procedure(s) (LRB): Direct Laryngoscopy  WITH BIOPSY (N/A) ESOPHAGOSCOPY  Anesthesia type: General  Patient location: PACU  Post pain: Pain level controlled  Post assessment: Patient's Cardiovascular Status Stable  Last Vitals:  Filed Vitals:   10/04/13 1246  BP: 128/64  Pulse: 79  Temp:   Resp: 17    Post vital signs: Reviewed and stable  Level of consciousness: alert  Complications: No apparent anesthesia complications

## 2013-10-04 NOTE — H&P (Signed)
Angel [name] is an 63 y.o. male.   Chief Complaint: neck mass, tongue base cancer HPI: 63 year old male with growing left neck mass for at least the past couple of months.  FNA shows carcinoma and imaging demonstrates nodal masses in both sides of the neck and a tongue base mass.  Presents for surgical biopsy.  Past Medical History  Diagnosis Date  . Constipation due to pain medication   . Cancer     left neck lymph node  . Current smoker     Past Surgical History  Procedure Laterality Date  . Testicle surgery      as a infant  . Appendectomy    . Mandible surgery      from [place]  . Back surgery      Lumbar  . Tonsillectomy      History reviewed. No pertinent family history. Social History:  reports that he has been smoking Cigarettes.  He has a 90 pack-year smoking history. He does not have any smokeless tobacco history on file. He reports that he uses illicit drugs (Marijuana). He reports that he does not drink alcohol.  Allergies: No Known Allergies  Medications Prior to Admission  Medication Sig Dispense Refill  . HYDROcodone-acetaminophen (NORCO/VICODIN) 5-325 MG per tablet Take 1 tablet by mouth every 4 (four) hours as needed.  20 tablet  0  . acetaminophen (TYLENOL) 500 MG tablet Take 1,000 mg by mouth 3 (three) times daily as needed (for pain).        No results found for this or any previous visit (from the past 48 hour(s)). No results found.  Review of Systems  HENT: Positive for sore throat.   All other systems reviewed and are negative.   Blood pressure 131/85, pulse 91, temperature 97.7 F (36.5 C), temperature source Oral, resp. rate 18, weight 53.071 kg (117 lb), SpO2 99.00%. Physical Exam  Constitutional: He is oriented to person, place, and time. He appears well-developed and well-nourished. No distress.  HENT:  Head: Normocephalic and atraumatic.  Right Ear: External ear normal.  Left Ear: External ear normal.  Nose: Nose normal.   Mouth/Throat: Oropharynx is clear and moist.  Eyes: Conjunctivae and EOM are normal. Pupils are equal, round, and reactive to light.  Neck: Normal range of motion. Neck supple.  Large, firm left neck mass, smaller right zone 2 mass.  Cardiovascular: Normal rate.   Respiratory: Effort normal.  Musculoskeletal: Normal range of motion.  Neurological: He is alert and oriented to person, place, and time. No cranial nerve deficit.  Skin: Skin is warm and dry.  Psychiatric: He has a normal mood and affect. His behavior is normal. Judgment and thought content normal.     Assessment/Plan Tongue base carcinoma, neck metastases To OR for DL with biopsy and esophagoscopy.  Melida Quitter 10/04/2013, 11:25 AM

## 2013-10-05 ENCOUNTER — Encounter (HOSPITAL_COMMUNITY): Payer: Self-pay | Admitting: Otolaryngology

## 2013-10-05 ENCOUNTER — Ambulatory Visit: Payer: Medicaid Other

## 2013-10-05 ENCOUNTER — Ambulatory Visit (HOSPITAL_BASED_OUTPATIENT_CLINIC_OR_DEPARTMENT_OTHER): Payer: Medicaid Other

## 2013-10-05 ENCOUNTER — Ambulatory Visit (HOSPITAL_BASED_OUTPATIENT_CLINIC_OR_DEPARTMENT_OTHER): Payer: Medicaid Other | Admitting: Hematology and Oncology

## 2013-10-05 ENCOUNTER — Encounter: Payer: Self-pay | Admitting: Hematology and Oncology

## 2013-10-05 VITALS — BP 120/67 | HR 79 | Temp 98.7°F | Resp 20 | Ht 65.5 in | Wt 114.5 lb

## 2013-10-05 DIAGNOSIS — D539 Nutritional anemia, unspecified: Secondary | ICD-10-CM

## 2013-10-05 DIAGNOSIS — D630 Anemia in neoplastic disease: Secondary | ICD-10-CM

## 2013-10-05 DIAGNOSIS — R634 Abnormal weight loss: Secondary | ICD-10-CM | POA: Insufficient documentation

## 2013-10-05 DIAGNOSIS — Z720 Tobacco use: Secondary | ICD-10-CM | POA: Insufficient documentation

## 2013-10-05 DIAGNOSIS — C760 Malignant neoplasm of head, face and neck: Secondary | ICD-10-CM

## 2013-10-05 DIAGNOSIS — R221 Localized swelling, mass and lump, neck: Secondary | ICD-10-CM

## 2013-10-05 DIAGNOSIS — R220 Localized swelling, mass and lump, head: Secondary | ICD-10-CM

## 2013-10-05 DIAGNOSIS — F17200 Nicotine dependence, unspecified, uncomplicated: Secondary | ICD-10-CM

## 2013-10-05 DIAGNOSIS — D649 Anemia, unspecified: Secondary | ICD-10-CM | POA: Insufficient documentation

## 2013-10-05 LAB — CBC & DIFF AND RETIC
BASO%: 0.6 % (ref 0.0–2.0)
BASOS ABS: 0.1 10*3/uL (ref 0.0–0.1)
EOS%: 2.8 % (ref 0.0–7.0)
Eosinophils Absolute: 0.4 10*3/uL (ref 0.0–0.5)
HEMATOCRIT: 34.8 % — AB (ref 38.4–49.9)
HGB: 11.5 g/dL — ABNORMAL LOW (ref 13.0–17.1)
IMMATURE RETIC FRACT: 8.8 % (ref 3.00–10.60)
LYMPH#: 2.5 10*3/uL (ref 0.9–3.3)
LYMPH%: 16 % (ref 14.0–49.0)
MCH: 33.2 pg (ref 27.2–33.4)
MCHC: 33 g/dL (ref 32.0–36.0)
MCV: 100.6 fL — ABNORMAL HIGH (ref 79.3–98.0)
MONO#: 1.7 10*3/uL — ABNORMAL HIGH (ref 0.1–0.9)
MONO%: 10.8 % (ref 0.0–14.0)
NEUT#: 11 10*3/uL — ABNORMAL HIGH (ref 1.5–6.5)
NEUT%: 69.8 % (ref 39.0–75.0)
PLATELETS: 495 10*3/uL — AB (ref 140–400)
RBC: 3.46 10*6/uL — ABNORMAL LOW (ref 4.20–5.82)
RDW: 15.9 % — ABNORMAL HIGH (ref 11.0–14.6)
Retic %: 1.16 % (ref 0.80–1.80)
Retic Ct Abs: 40.14 10*3/uL (ref 34.80–93.90)
WBC: 15.8 10*3/uL — ABNORMAL HIGH (ref 4.0–10.3)

## 2013-10-05 LAB — COMPREHENSIVE METABOLIC PANEL (CC13)
ALBUMIN: 2.9 g/dL — AB (ref 3.5–5.0)
ALK PHOS: 47 U/L (ref 40–150)
ALT: 7 U/L (ref 0–55)
AST: 8 U/L (ref 5–34)
Anion Gap: 8 mEq/L (ref 3–11)
BILIRUBIN TOTAL: 0.21 mg/dL (ref 0.20–1.20)
BUN: 14.1 mg/dL (ref 7.0–26.0)
CO2: 30 mEq/L — ABNORMAL HIGH (ref 22–29)
Calcium: 9.5 mg/dL (ref 8.4–10.4)
Chloride: 104 mEq/L (ref 98–109)
Creatinine: 0.8 mg/dL (ref 0.7–1.3)
GLUCOSE: 104 mg/dL (ref 70–140)
POTASSIUM: 3.9 meq/L (ref 3.5–5.1)
Sodium: 142 mEq/L (ref 136–145)
Total Protein: 6.4 g/dL (ref 6.4–8.3)

## 2013-10-05 LAB — CHCC SMEAR

## 2013-10-05 LAB — T4, FREE: Free T4: 1.3 ng/dL (ref 0.80–1.80)

## 2013-10-05 LAB — VITAMIN B12: VITAMIN B 12: 417 pg/mL (ref 211–911)

## 2013-10-05 NOTE — Assessment & Plan Note (Signed)
Stage of the disease is to be determined, a PET/CT scan has been ordered.   Prognosis would depend on the results for the PET/CT scan, to be discussed and reviewed in the next visit.   Treatment options would include chemotherapy only, radiation only or chemotherapy in combination with radiation therapy.    In preparation for treatment, the patient will need the following tests or referrals, to be arranged #1 Referral to Radiation Oncology for consultation.  #2 Referral to dentist for full dental evaluation and possible dental extraction  #3 PET/CT scan.  #4 Referral to Speech Pathologist  #5 Referral to Nutritionist  #6 Referral to Social Worker #7 Referral to chemotherapy class to learn about practical tips while on treatment.  #8 Blood work

## 2013-10-05 NOTE — Assessment & Plan Note (Signed)
I spent some time counseling the patient the importance of tobacco cessation. he is currently attempting to quit on his own 

## 2013-10-05 NOTE — Assessment & Plan Note (Signed)
I will refer him to see a dietitian.

## 2013-10-05 NOTE — Progress Notes (Signed)
[facility] CONSULT NOTE  Patient Care Team: Lorayne Marek, MD as PCP - General (Internal Medicine)  CHIEF COMPLAINTS/PURPOSE OF CONSULTATION:  Squamous cell carcinoma of the tongue with bulky bilateral lymph node metastasis  HISTORY OF PRESENTING ILLNESS:  Angel [name] 63 y.o. male is here because of newly diagnosed is a question of the tongue According to the patient, the first initial presentation was due to an enlarging left neck mass. he denies any hearing deficit, difficulties with chewing food, swallowing difficulties, painful swallowing, changes in the quality of his voice. He has lost a bit of weight. I have reviewed his records extensively and summarized as follows:   Cancer of base of tongue   09/17/2013 Imaging CT scan of the neck showed large bilateral neck lymphadenopathy with associated tongue mass.   09/23/2013 Procedure Fine needle aspirate of the left neck mass came back positive for squamous cell carcinoma.   10/04/2013 Surgery Laryngoscopy showed a firm mass within the left tongue base extending past the midline and encompassing much of the tongue base.  There is extension of the mass into the vallecula and on to the lingual surface of the epiglottis.   10/04/2013 Pathology Vallecula and tongue biopsy confirmed squamous cell carcinoma.    MEDICAL HISTORY:  Past Medical History  Diagnosis Date  . Constipation due to pain medication   . Cancer     left neck lymph node  . Current smoker     SURGICAL HISTORY: Past Surgical History  Procedure Laterality Date  . Testicle surgery      as a infant  . Appendectomy    . Mandible surgery      from [place]  . Back surgery      Lumbar  . Tonsillectomy    . Panendoscopy N/A 10/04/2013    Procedure: Direct Laryngoscopy  WITH BIOPSY;  Surgeon: Melida Quitter, MD;  Location: [facility];  Service: ENT;  Laterality: N/A;  . Esophagoscopy  10/04/2013    Procedure: ESOPHAGOSCOPY;  Surgeon: Melida Quitter, MD;  Location: [facility];  Service: ENT;;    SOCIAL HISTORY: History   Social History  . Marital Status: Married    Spouse Name: N/A    Number of Children: N/A  . Years of Education: N/A   Occupational History  . Not on file.   Social History Main Topics  . Smoking status: Heavy Tobacco Smoker -- 2.00 packs/day for 45 years    Types: Cigarettes  . Smokeless tobacco: Not on file  . Alcohol Use: No  . Drug Use: Yes    Special: Marijuana  . Sexual Activity: Not on file   Other Topics Concern  . Not on file   Social History Narrative  . No narrative on file    FAMILY HISTORY: No family history on file.  ALLERGIES:  has No Known Allergies.  MEDICATIONS:  Current Outpatient Prescriptions  Medication Sig Dispense Refill  . acetaminophen (TYLENOL) 500 MG tablet Take 1,000 mg by mouth 3 (three) times daily as needed (for pain).      Marland Kitchen HYDROcodone-acetaminophen (NORCO/VICODIN) 5-325 MG per tablet Take 1-2 tablets by mouth every 6 (six) hours as needed for moderate pain.  40 tablet  0   No current facility-administered medications for this visit.    REVIEW OF SYSTEMS:   Constitutional: Denies fevers, chills or abnormal night sweats Eyes: Denies blurriness of vision, double vision or watery eyes Ears, nose, mouth, throat, and face: Denies mucositis or sore throat  Respiratory: Denies cough, dyspnea or wheezes Cardiovascular: Denies palpitation, chest discomfort or lower extremity swelling Gastrointestinal:  Denies nausea, heartburn or change in bowel habits Skin: Denies abnormal skin rashes Neurological:Denies numbness, tingling or new weaknesses Behavioral/Psych: Mood is stable, no new changes  All other systems were reviewed with the patient and are negative.  PHYSICAL EXAMINATION: ECOG PERFORMANCE STATUS: 1 - Symptomatic but completely ambulatory  Filed Vitals:   10/05/13 1527  BP: 120/67  Pulse: 79  Temp: 98.7 F (37.1 C)  Resp: 20   Filed Weights   10/05/13 1527  Weight: 114  lb 8 oz (51.937 kg)    GENERAL:alert, no distress and comfortable. He looks thin but not cachectic SKIN: skin color, texture, turgor are normal, no rashes or significant lesions EYES: normal, conjunctiva are pink and non-injected, sclera clear OROPHARYNX: Unable to see the posterior portion of his oropharynx. Poor dentition is noted.  NECK: supple, thyroid normal size, non-tender, without nodularity LYMPH:  Large bulky lymphadenopathy on the left side of his neck with a lesion draining serosanguineous fluid. There is also lymphadenopathy on the right side. LUNGS: clear to auscultation and percussion with normal breathing effort HEART: regular rate & rhythm and no murmurs and no lower extremity edema ABDOMEN:abdomen soft, non-tender and normal bowel sounds Musculoskeletal:no cyanosis of digits and no clubbing  PSYCH: alert & oriented x 3 with fluent speech NEURO: no focal motor/sensory deficits  LABORATORY DATA:  I have reviewed the data as listed Lab Results  Component Value Date   WBC 15.8* 10/05/2013   HGB 11.5* 10/05/2013   HCT 34.8* 10/05/2013   MCV 100.6* 10/05/2013   PLT 495* 10/05/2013   Lab Results  Component Value Date   NA 142 10/05/2013   K 3.9 10/05/2013   CL 98 09/29/2013   CO2 30* 10/05/2013    RADIOGRAPHIC STUDIES: I have personally reviewed the radiological images as listed and agreed with the findings in the report. Ct Soft Tissue Neck W Contrast  09/17/2013   CLINICAL DATA:  Left neck mass.  EXAM: CT NECK WITH CONTRAST  TECHNIQUE: Multidetector CT imaging of the neck was performed using the standard protocol following the bolus administration of intravenous contrast.  CONTRAST:  131mL OMNIPAQUE IOHEXOL 300 MG/ML  SOLN  COMPARISON:  None.  FINDINGS: The visualized portion of the brain demonstrates cerebral atrophy orbits are unremarkable there is trace right maxillary sinus mucosal thickening. Mastoid air cells are clear.  There is a large, centrally necrotic mass deep to the left  sternocleidomastoid spanning levels II and III and measuring 6.6 x 5.5 x 6.2 cm (AP by transverse by craniocaudal). Finding is compatible with a necrotic nodal mass. Necrotic left level II lymph node superior to this dominant mass measures 2.5 x 2.2 cm. Necrotic right level II lymph nodes measure up to 2.2 x 1.9 cm in size. Right level III lymph nodes measure up to 6 mm and short axis. A mildly asymmetric left supraclavicular lymph node measures 6 mm.  There is a large enhancing mass in the tongue base measuring 3.7 x 3.5 x 2.9 cm (transverse by AP by craniocaudal). Mass extends into the vallecula and superior aspect of the pre epiglottic space.  The thyroid is unremarkable. There is prominent, diffuse enhancement of the salivary glands and the lymphoid tissue of the neck. No submandibular or parotid mass is identified. Visualized lung apices demonstrate very mild scarring as well as a 3 mm nodule in the apical right upper lobe (series 4, image 98).  There is moderate to advanced multilevel degenerative disc disease in the cervical spine. Moderate facet arthrosis is present on the left at C3-4.  IMPRESSION: Large tongue base malignancy with necrotic, bulky metastatic cervical lymphadenopathy, left greater than right.   Electronically Signed   By: Logan Bores   On: 09/17/2013 14:42    ASSESSMENT:  Newly diagnosed squamous cell carcinoma of the Head & Neck, HPV N/A  PLAN:  Cancer of base of tongue Stage of the disease is to be determined, a PET/CT scan has been ordered.   Prognosis would depend on the results for the PET/CT scan, to be discussed and reviewed in the next visit.   Treatment options would include chemotherapy only, radiation only or chemotherapy in combination with radiation therapy.    In preparation for treatment, the patient will need the following tests or referrals, to be arranged #1 Referral to Radiation Oncology for consultation.  #2 Referral to dentist for full dental evaluation  and possible dental extraction  #3 PET/CT scan.  #4 Referral to Speech Pathologist  #5 Referral to Nutritionist  #6 Referral to Social Worker #7 Referral to chemotherapy class to learn about practical tips while on treatment.  #8 Blood work        Tobacco abuse I spent some time counseling the patient the importance of tobacco cessation. he is currently attempting to quit on his own   Anemia in neoplastic disease I am concerned for potential bone marrow disease. I will order additional workup for this.  Weight loss I will refer him to see a dietitian.   I will get his case presented at the next ENT tumor board.   Orders Placed This Encounter  Procedures  . NM PET Image Initial (PI) Skull Base To Thigh    Standing Status: Future     Number of Occurrences:      Standing Expiration Date: 10/05/2014    Order Specific Question:  Reason for Exam (SYMPTOM  OR DIAGNOSIS REQUIRED)    Answer:  staging tongue cancer    Order Specific Question:  Preferred imaging location?    Answer:  [facility]  . Comprehensive metabolic panel    Standing Status: Future     Number of Occurrences: 1     Standing Expiration Date: 10/05/2014  . T4, free    Standing Status: Future     Number of Occurrences: 1     Standing Expiration Date: 10/05/2014  . TSH    Standing Status: Future     Number of Occurrences: 1     Standing Expiration Date: 10/05/2014  . Iron and TIBC    Standing Status: Future     Number of Occurrences: 1     Standing Expiration Date: 10/05/2014  . Ferritin    Standing Status: Future     Number of Occurrences: 1     Standing Expiration Date: 10/05/2014  . Smear    Standing Status: Future     Number of Occurrences: 1     Standing Expiration Date: 10/05/2014  . Vitamin B12    Standing Status: Future     Number of Occurrences: 1     Standing Expiration Date: 10/05/2014  . CBC & Diff and Retic    Standing Status: Future     Number of Occurrences: 1     Standing Expiration  Date: 10/05/2014  . Ambulatory Referral to Speech Therapy  (specifically to Garald Balding)    Referral Priority:  Routine  Referral Type:  Speech Therapy    Referral Reason:  Specialty Services Required    Requested Specialty:  Speech Pathology    Number of Visits Requested:  1  . Ambulatory Referral to Dentistry (specifically to Dr. Enrique Sack)    Referral Priority:  Routine    Referral Type:  Consultation    Referral Reason:  Specialty Services Required    Requested Specialty:  Dental General Practice    Number of Visits Requested:  1  . Amb Referral to Nutrition and Diabetic Education (specifically to Ernestene Kiel)    Referral Priority:  Routine    Referral Type:  Consultation    Referral Reason:  Specialty Services Required    Number of Visits Requested:  1  . Ambulatory Referral to Social Work    Referral Priority:  Routine    Referral Type:  Consultation    Referral Reason:  Specialty Services Required    Number of Visits Requested:  1  . Ambulatory Referral to Physical Therapy    Referral Priority:  Routine    Referral Type:  Physical Medicine    Referral Reason:  Specialty Services Required    Requested Specialty:  Physical Therapy    Number of Visits Requested:  1    All questions were answered. The patient knows to call the clinic with any problems, questions or concerns. I spent 40 minutes counseling the patient face to face. The total time spent in the appointment was 60 minutes and more than 50% was on counseling.     [name], MD 10/05/2013 10:45 PM

## 2013-10-05 NOTE — Op Note (Signed)
NAME:  Angel [name], Angel [name] NO.:  [id number]  MEDICAL RECORD NO.:  [id number]  LOCATION:  [facility]                         FACILITY:  [facility]  PHYSICIAN:  Onnie Graham, MD     DATE OF BIRTH:  1951/03/19  DATE OF PROCEDURE:  10/04/2013 DATE OF DISCHARGE:  10/04/2013                              OPERATIVE REPORT   PREOPERATIVE DIAGNOSES: 1. Left tongue base carcinoma. 2. Cervical metastasis.  POSTOPERATIVE DIAGNOSES: 1. Left tongue base carcinoma. 2. Cervical metastasis.  PROCEDURE: 1. Direct laryngoscopy with biopsy. 2. Esophagoscopy.  SURGEON:  Onnie Graham, MD  ANESTHESIA:  General endotracheal anesthesia.  COMPLICATIONS:  None.  INDICATIONS:  The patient is a 63 year old male who admits to a 2 months or so period of an increasing neck mass in the left side.  He has a large neck mass there which has been shown to have carcinoma by needle biopsy.  CT imaging demonstrates a mass also in the right side of smaller size and the tongue base mass.  He presents to the operating room for surgical biopsy.  FINDINGS:  There is a firm mass within the left tongue base extending past the midline and encompassing much of the tongue base.  There is extension of the mass into the vallecula and on to the lingual surface of the epiglottis.  The remaining pharynx and larynx are normal as is the cervical esophagus.  DESCRIPTION OF PROCEDURE:  The patient was identified in the holding room and informed consent having been obtained with discussion of risks, benefits, alternatives, the patient was brought to the operative suite and put on the operative table in supine position.  Anesthesia was induced and attempts at intubation were made by the Anesthesia Team using the GlideScope without success.  I then tried with the GlideScope and was able to visualize the glottis and placed the endotracheal tube without difficulty.  Anesthesia was further given and the tube taped  the right side.  The bed was turned 90 degrees from anesthesia after taping the eyes closed.  The patient was given intravenous antibiotics during the case.  A damp gauze was placed over the upper gum and an anterior commissure laryngoscope was then used to visualize the various areas of the pharynx and larynx.  After this was completed, the laryngoscope was removed and a cervical esophagoscope was passed down the upper esophagus keeping the lumen in view down to the midesophagus.  It was then backed out carefully examining the esophagus on the way out.  The anterior commissure laryngoscope was reinserted and cup forceps were used to take biopsies from the vallecula and separately biopsies from the left tongue base mass.  These were sent for pathology.  The pharynx was suctioned and the laryngoscope was taken out of the patient's mouth.  He was turned back to Anesthesia for wake up and was extubated, moved to the recovery room in stable condition.     Onnie Graham, MD     DDB/MEDQ  D:  10/04/2013  T:  10/05/2013  Job:  [id number]

## 2013-10-05 NOTE — Assessment & Plan Note (Signed)
I am concerned for potential bone marrow disease. I will order additional workup for this.

## 2013-10-05 NOTE — Progress Notes (Signed)
Checked in new pt with no insurance.  I gave him a Medicaid application and discussed financial assistance thru the hospital.  We also discussed drug replacement.  I forwarded pt's name to [facility].  We talked about the [place] and how it works.  Pt is not working right now but his wife is so she will bring in her check stubs on 10/08/13 to see if they qualify for the grant.  I gave pt my card and Meredith's name and number since he will be visiting Dr. Isidore Moos on 10/08/13.

## 2013-10-06 ENCOUNTER — Other Ambulatory Visit: Payer: Self-pay | Admitting: Hematology and Oncology

## 2013-10-06 ENCOUNTER — Telehealth: Payer: Self-pay | Admitting: Hematology and Oncology

## 2013-10-06 ENCOUNTER — Ambulatory Visit: Payer: Medicaid Other | Attending: Hematology and Oncology | Admitting: Physical Therapy

## 2013-10-06 ENCOUNTER — Encounter: Payer: Self-pay | Admitting: Hematology and Oncology

## 2013-10-06 DIAGNOSIS — C779 Secondary and unspecified malignant neoplasm of lymph node, unspecified: Secondary | ICD-10-CM

## 2013-10-06 DIAGNOSIS — IMO0001 Reserved for inherently not codable concepts without codable children: Secondary | ICD-10-CM | POA: Diagnosis not present

## 2013-10-06 DIAGNOSIS — C01 Malignant neoplasm of base of tongue: Secondary | ICD-10-CM

## 2013-10-06 DIAGNOSIS — C029 Malignant neoplasm of tongue, unspecified: Secondary | ICD-10-CM | POA: Diagnosis not present

## 2013-10-06 DIAGNOSIS — R293 Abnormal posture: Secondary | ICD-10-CM | POA: Diagnosis not present

## 2013-10-06 HISTORY — DX: Secondary and unspecified malignant neoplasm of lymph node, unspecified: C77.9

## 2013-10-06 LAB — IRON AND TIBC CHCC
%SAT: 29 % (ref 20–55)
Iron: 60 ug/dL (ref 42–163)
TIBC: 206 ug/dL (ref 202–409)
UIBC: 146 ug/dL (ref 117–376)

## 2013-10-06 LAB — TSH CHCC: TSH: 0.501 m[IU]/L (ref 0.320–4.118)

## 2013-10-06 LAB — FERRITIN CHCC: Ferritin: 102 ng/ml (ref 22–316)

## 2013-10-06 NOTE — Progress Notes (Signed)
Met with patient and his wife during initial consult with Dr. Alvy Bimler.  Introduced myself as Designer, television/film set, explained my role as a member of the Care Team, provided contact information, encouraged them to contact me with questions/concerns as treatments/procedures begin.  Facilitated post-visit lab work after which I showed them the location of Dr. Ritta Slot office and [facility], explained arrival procedures for future appts at these locations.  Explained arrival/registration procedure for [facility] appts.  They verbalized understanding of all instructions.  Initiating navigation as L1 patient (new) with this encounter.  Gayleen Orem, RN, BSN, [facility] Head [facility] Neck Oncology Navigator [phone]

## 2013-10-06 NOTE — Telephone Encounter (Signed)
s.w. pt wife and advised on appt....she will advised husband to pick up new sched friday

## 2013-10-07 ENCOUNTER — Encounter (HOSPITAL_COMMUNITY): Payer: Self-pay | Admitting: Dentistry

## 2013-10-07 ENCOUNTER — Encounter (HOSPITAL_COMMUNITY): Payer: Self-pay | Admitting: Pharmacy Technician

## 2013-10-07 ENCOUNTER — Telehealth: Payer: Self-pay | Admitting: *Deleted

## 2013-10-07 ENCOUNTER — Ambulatory Visit (HOSPITAL_COMMUNITY): Payer: Medicaid - Dental | Admitting: Dentistry

## 2013-10-07 VITALS — BP 129/76 | HR 67 | Temp 98.6°F

## 2013-10-07 DIAGNOSIS — K029 Dental caries, unspecified: Secondary | ICD-10-CM

## 2013-10-07 DIAGNOSIS — K0530 Chronic periodontitis, unspecified: Secondary | ICD-10-CM

## 2013-10-07 DIAGNOSIS — K08109 Complete loss of teeth, unspecified cause, unspecified class: Secondary | ICD-10-CM

## 2013-10-07 DIAGNOSIS — K036 Deposits [accretions] on teeth: Secondary | ICD-10-CM

## 2013-10-07 DIAGNOSIS — Z0189 Encounter for other specified special examinations: Secondary | ICD-10-CM

## 2013-10-07 DIAGNOSIS — K045 Chronic apical periodontitis: Secondary | ICD-10-CM

## 2013-10-07 DIAGNOSIS — Z972 Presence of dental prosthetic device (complete) (partial): Secondary | ICD-10-CM

## 2013-10-07 DIAGNOSIS — M264 Malocclusion, unspecified: Secondary | ICD-10-CM

## 2013-10-07 DIAGNOSIS — IMO0002 Reserved for concepts with insufficient information to code with codable children: Secondary | ICD-10-CM

## 2013-10-07 DIAGNOSIS — K0889 Other specified disorders of teeth and supporting structures: Secondary | ICD-10-CM

## 2013-10-07 DIAGNOSIS — K083 Retained dental root: Secondary | ICD-10-CM

## 2013-10-07 DIAGNOSIS — C01 Malignant neoplasm of base of tongue: Secondary | ICD-10-CM

## 2013-10-07 MED ORDER — CEFAZOLIN SODIUM-DEXTROSE 2-3 GM-% IV SOLR
2.0000 g | Freq: Once | INTRAVENOUS | Status: AC
  Administered 2013-10-08: 2 g via INTRAVENOUS
  Filled 2013-10-07: qty 50

## 2013-10-07 NOTE — Progress Notes (Signed)
DENTAL CONSULTATION  Date of Consultation:  10/07/2013 Patient Name:   Angel [name] Date of Birth:   [birth year] Medical Record Number: [id number]  VITALS: BP 129/76  Pulse 67  Temp(Src) 98.6 F (37 C) (Oral)   CHIEF COMPLAINT: Patient was referred for a pre-chemoradiation therapy dental protocol examination.  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 Patient with anticipated chemoradiation therapy. Patient now seen as part of a medically necessary pre-chemoradiation therapy dental protocol examination.  The patient currently denies acute toothache, swellings, or abscesses. The patient was last seen by a dentist 5-6 years ago for insertion of an upper acrylic partial denture. This was performed by Dr. Achilles Dunk who has since retired. Patient no longer seeks regular dental care.   PROBLEM LIST: Patient Active Problem List   Diagnosis Date Noted  . Cancer of base of tongue 09/17/2013    Priority: High  . 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 Tobacco abuse 10/05/2013  . Anemia in neoplastic disease 10/05/2013  . Weight loss 10/05/2013    PMH: Past Medical History  Diagnosis Date  . Constipation due to pain medication   . Malignant neoplasm of base of tongue     left neck lymph and Left BOT cancer  . Current smoker   . Metastasis to lymph nodes 10/06/2013    PSH: Past Surgical History  Procedure Laterality Date  . Testicle surgery      as a infant  . Appendectomy    . Mandible surgery      from [place]  . Back surgery      Lumbar  . Tonsillectomy    . Panendoscopy N/A 10/04/2013    Procedure: Direct Laryngoscopy  WITH BIOPSY;  Surgeon: Melida Quitter, MD;  Location: [place];  Service: ENT;  Laterality: N/A;  . Esophagoscopy  10/04/2013    Procedure: ESOPHAGOSCOPY;  Surgeon: Melida Quitter, MD;  Location: [place];  Service: ENT;;    ALLERGIES: No Known  Allergies  MEDICATIONS: Current Outpatient Prescriptions  Medication Sig Dispense Refill  . acetaminophen (TYLENOL) 500 MG tablet Take 1,000 mg by mouth 3 (three) times daily as needed (for pain).      Marland Kitchen HYDROcodone-acetaminophen (NORCO/VICODIN) 5-325 MG per tablet Take 1-2 tablets by mouth every 6 (six) hours as needed for moderate pain.  40 tablet  0   No current facility-administered medications for this visit.    LABS: Lab Results  Component Value Date   WBC 15.8* 10/05/2013   HGB 11.5* 10/05/2013   HCT 34.8* 10/05/2013   MCV 100.6* 10/05/2013   PLT 495* 10/05/2013      Component Value Date/Time   NA 142 10/05/2013 1602   NA 134* 09/29/2013 1318   K 3.9 10/05/2013 1602   K 5.1 09/29/2013 1318   CL 98 09/29/2013 1318   CO2 30* 10/05/2013 1602   CO2 27 09/29/2013 1318   GLUCOSE 104 10/05/2013 1602   GLUCOSE 111* 09/29/2013 1318   BUN 14.1 10/05/2013 1602   BUN 12 09/29/2013 1318   CREATININE 0.8 10/05/2013 1602   CREATININE 0.64 09/29/2013 1318   CALCIUM 9.5 10/05/2013 1602   CALCIUM 9.8 09/29/2013 1318   GFRNONAA >90 09/29/2013 1318   GFRAA >90 09/29/2013 1318   No results found for this basename: INR, PROTIME   No results found for this basename: PTT    SOCIAL HISTORY: History   Social  History  . Marital Status: Married    Spouse Name: N/A    Number of Children: 1  . Years of Education: N/A   Occupational History  .     Social History Main Topics  . Smoking status: Heavy Tobacco Smoker -- 2.00 packs/day for 45 years    Types: Cigarettes  . Smokeless tobacco: Never Used  . Alcohol Use: No     Comment: Denies use  . Drug Use: Yes    Special: Marijuana  . Sexual Activity: Not on file   Other Topics Concern  . Not on file   Social History Narrative  . No narrative on file    FAMILY HISTORY: Family History  Problem Relation Age of Onset  . Cancer Mother     Lung  . Cancer Father     ? Lung     REVIEW OF SYSTEMS: Reviewed with the patient. Constitutional: Denies fevers, chills  or abnormal night sweats  Eyes: Denies blurriness of vision, double vision or watery eyes  Ears, nose, mouth, throat, and face: Denies mucositis or sore throat  Respiratory: Denies cough, dyspnea or wheezes  Cardiovascular: Denies palpitation, chest discomfort or lower extremity swelling  Gastrointestinal: Denies nausea, heartburn or change in bowel habits  Skin: Denies abnormal skin rashes  Neurological:Denies numbness, tingling or new weaknesses  Behavioral/Psych: Mood is stable, no new changes  All other systems were reviewed with the patient and are negative.    DENTAL HISTORY: CHIEF COMPLAINT: Patient was referred for a pre-chemoradiation therapy dental protocol examination.  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 Patient with anticipated chemoradiation therapy. Patient now seen as part of a medically necessary pre-chemoradiation therapy dental protocol examination.  The patient currently denies acute toothache, swellings, or abscesses. The patient was last seen by a dentist 5-6 years ago for insertion of an upper acrylic partial denture. This was performed by Dr. Achilles Dunk who has since retired. Patient no longer seeks regular dental care.  DENTAL EXAMINATION: GENERAL: The patient is a well-developed, well-nourished male in no acute distress. HEAD AND NECK: The patient has significant left neck lymphadenopathy. Patient also has right neck lymphadenopathy. The patient denies acute TMJ symptoms. INTRAORAL EXAM: Patient has incipient xerostomia. There is no evidence of intraoral abscess formation. DENTITION: Patient is missing tooth numbers 1, 7, 8, 9, 10, 16, 17, 30, and 32. There are retained roots in the area of tooth numbers 5, 14. PERIODONTAL: Patient has chronic periodontitis with plaque and calculus accumulations, generalized gingival recession, and generalized tooth  mobility as per dental charting form. DENTAL CARIES/SUBOPTIMAL RESTORATIONS: Patient has multiple dental caries as per dental charting form. ENDODONTIC: Patient currently denies acute pulpitis symptoms. There is a large periapical radiolucency associated with the apices of tooth #28. There is periapical pathology associated with the retained root segment #5. Patient has had previous root canal therapies associated with tooth numbers 13, 14, 19, and 28. CROWN AND BRIDGE: Patient has porcelain fused to metal crown restorations on tooth numbers 19, 20. There is a stainless steel crown on tooth #31. PROSTHODONTIC: Patient has an upper acrylic partial denture replacing tooth numbers 7, 8, 9, 10. This is ill fitting. OCCLUSION: Patient has a poor occlusal scheme secondary to multiple missing teeth, supra-eruption and drifting of the unopposed teeth into the edentulous areas, and lack of replacement of missing teeth with clinically acceptable dental prostheses.  RADIOGRAPHIC INTERPRETATION:  An orthopantogram was taken and supplemented with a full series of dental radiographs. There are multiple missing teeth. There are multiple retained root segments. There are multiple dental caries. There is moderate to severe bone loss. There is periapical  pathology and radiolucency associate with tooth numbers 5 and 28. There is supra-eruption and drifting of the unopposed teeth into the edentulous areas. Multiple diastemas are noted.   ASSESSMENTS: 1. Squamous cell carcinoma of the left base of tongue. 2. Pre-chemoradiation therapy dental protocol examination 3. Chronic apical periodontitis 4. Multiple dental caries 5. Multiple retained root segments 6. Multiple defective of restorations with fractures and recurrent caries 7. Chronic periodontitis of bone loss 8. Gingival recession 9. Tooth mobility 10. Plaque and calculus accumulations 11. Multiple missing teeth 12. Ill-fitting maxillary acrylic partial  denture 13. Poor occlusal scheme and malocclusion 14. Large periapical radiolucency associated with the apex of tooth #28.  PLAN/RECOMMENDATIONS: 1. I discussed the risks, benefits, and complications of various treatment options with the patient in relationship to his medical and dental conditions, anticipated accumulation therapy, and chemoradiation therapy side effects to include xerostomia, radiation caries, trismus, mucositis, taste changes, gum and jawbone changes, and risk for infection, bleeding, and osteoradionecrosis. We discussed various treatment options to include no treatment, total and subtotal  extractions with alveoloplasty, pre-prosthetic surgery as indicated, periodontal therapy, dental restorations, root canal therapy, crown and bridge therapy, implant therapy, and replacement of missing teeth as indicated. The patient currently wishes to proceed with extraction remaining teeth with alveoloplasty and pre-prosthetic surgery as indicated in the operating room with general anesthesia on 10/08/2013 at 10 AM at [facility].  The patient will then followup with a dentist of his choice for fabrication of upper and lower complete dentures after adequate healing and approximately 3 months after the last radiation therapy has been provided.  The patient is aware of the potential complications to include bleeding, bruising, swelling, pain, infection, nerve damage, sinus involvement, root tip fracture, mandible fracture, and complications associated with the general anesthesia. The patient also is aware of the potential for complications not mentioned above.   2. Discussion of findings with medical team and coordination of future medical and dental care as needed. Dr. Lanell Persons appointment for Friday will be rescheduled for next week with addition hopefully to the multidisciplinary cancer care conference on 10/13/2013.  I spent 90 minutes face to face with patient and more than 50% of time was  spent in counseling and /or coordination of care.   Lenn Cal, DDS

## 2013-10-07 NOTE — Telephone Encounter (Signed)
Called patient, spoke with wife, to inform that his appt with Dr. Isidore Moos for tomorrow morning has been cancelled d/t dental extractions scheduled for tomorrow morning per his visit with Dr. Enrique Sack this morning.  Informed that patient will be seen in next week Wednesday's H&N MDC, asked them to arrive for the clinic at 12:30.  She verbalized understanding.  Gayleen Orem, RN, BSN, [facility] On[facility] Navigator [phone]

## 2013-10-07 NOTE — Patient Instructions (Signed)

## 2013-10-08 ENCOUNTER — Encounter (HOSPITAL_COMMUNITY): Payer: Self-pay | Admitting: *Deleted

## 2013-10-08 ENCOUNTER — Ambulatory Visit (HOSPITAL_COMMUNITY)
Admission: RE | Admit: 2013-10-08 | Discharge: 2013-10-08 | Disposition: A | Discharge status: Home / Self Care | Payer: Medicaid Other | Source: Ambulatory Visit | Attending: Dentistry | Admitting: Dentistry

## 2013-10-08 ENCOUNTER — Ambulatory Visit: Payer: Medicaid Other

## 2013-10-08 ENCOUNTER — Encounter (HOSPITAL_COMMUNITY): Payer: Medicaid Other | Admitting: Certified Registered"

## 2013-10-08 ENCOUNTER — Encounter (HOSPITAL_COMMUNITY)
Admission: RE | Disposition: A | Discharge status: Home / Self Care | Payer: Self-pay | Source: Ambulatory Visit | Attending: Dentistry

## 2013-10-08 ENCOUNTER — Ambulatory Visit (HOSPITAL_COMMUNITY): Payer: Medicaid Other | Admitting: Certified Registered"

## 2013-10-08 ENCOUNTER — Ambulatory Visit: Payer: Self-pay | Admitting: Radiation Oncology

## 2013-10-08 DIAGNOSIS — F17200 Nicotine dependence, unspecified, uncomplicated: Secondary | ICD-10-CM | POA: Insufficient documentation

## 2013-10-08 DIAGNOSIS — K0530 Chronic periodontitis, unspecified: Secondary | ICD-10-CM | POA: Insufficient documentation

## 2013-10-08 DIAGNOSIS — K048 Radicular cyst: Secondary | ICD-10-CM | POA: Diagnosis not present

## 2013-10-08 DIAGNOSIS — D649 Anemia, unspecified: Secondary | ICD-10-CM | POA: Diagnosis not present

## 2013-10-08 DIAGNOSIS — K5900 Constipation, unspecified: Secondary | ICD-10-CM | POA: Insufficient documentation

## 2013-10-08 DIAGNOSIS — K045 Chronic apical periodontitis: Secondary | ICD-10-CM

## 2013-10-08 DIAGNOSIS — K029 Dental caries, unspecified: Secondary | ICD-10-CM | POA: Diagnosis not present

## 2013-10-08 DIAGNOSIS — C01 Malignant neoplasm of base of tongue: Secondary | ICD-10-CM | POA: Insufficient documentation

## 2013-10-08 DIAGNOSIS — K083 Retained dental root: Secondary | ICD-10-CM

## 2013-10-08 HISTORY — PX: MULTIPLE EXTRACTIONS WITH ALVEOLOPLASTY: SHX5342

## 2013-10-08 SURGERY — MULTIPLE EXTRACTION WITH ALVEOLOPLASTY
Anesthesia: General | Site: Mouth

## 2013-10-08 MED ORDER — LIDOCAINE HCL (CARDIAC) 20 MG/ML IV SOLN
INTRAVENOUS | Status: DC | PRN
  Administered 2013-10-08: 100 mg via INTRAVENOUS

## 2013-10-08 MED ORDER — HYDROMORPHONE HCL PF 1 MG/ML IJ SOLN
INTRAMUSCULAR | Status: AC
  Filled 2013-10-08: qty 1

## 2013-10-08 MED ORDER — PROPOFOL 10 MG/ML IV BOLUS
INTRAVENOUS | Status: AC
  Filled 2013-10-08: qty 20

## 2013-10-08 MED ORDER — MEPERIDINE HCL 25 MG/ML IJ SOLN: 6.2500 mg | INTRAMUSCULAR | Status: DC | PRN

## 2013-10-08 MED ORDER — PHENYLEPHRINE HCL 10 MG/ML IJ SOLN
INTRAMUSCULAR | Status: DC | PRN
  Administered 2013-10-08 (×8): 40 ug via INTRAVENOUS

## 2013-10-08 MED ORDER — MIDAZOLAM HCL 5 MG/5ML IJ SOLN
INTRAMUSCULAR | Status: DC | PRN
  Administered 2013-10-08: 2 mg via INTRAVENOUS

## 2013-10-08 MED ORDER — EPINEPHRINE HCL 1 MG/ML IJ SOLN
INTRAMUSCULAR | Status: AC
  Filled 2013-10-08: qty 1

## 2013-10-08 MED ORDER — ROCURONIUM BROMIDE 50 MG/5ML IV SOLN
INTRAVENOUS | Status: AC
  Filled 2013-10-08: qty 1

## 2013-10-08 MED ORDER — MIDAZOLAM HCL 2 MG/2ML IJ SOLN
INTRAMUSCULAR | Status: AC
  Filled 2013-10-08: qty 2

## 2013-10-08 MED ORDER — LACTATED RINGERS IV SOLN
INTRAVENOUS | Status: DC
  Administered 2013-10-08: 08:00:00 via INTRAVENOUS

## 2013-10-08 MED ORDER — LIDOCAINE-EPINEPHRINE 2 %-1:100000 IJ SOLN
INTRAMUSCULAR | Status: DC | PRN
  Administered 2013-10-08 (×5): 1.7 mL via INTRADERMAL

## 2013-10-08 MED ORDER — OXYCODONE HCL 5 MG PO TABS: 5.0000 mg | ORAL_TABLET | Freq: Once | ORAL | Status: DC | PRN

## 2013-10-08 MED ORDER — SUCCINYLCHOLINE CHLORIDE 20 MG/ML IJ SOLN
INTRAMUSCULAR | Status: DC | PRN
  Administered 2013-10-08: 100 mg via INTRAVENOUS

## 2013-10-08 MED ORDER — OXYCODONE-ACETAMINOPHEN 5-325 MG PO TABS: ORAL_TABLET | ORAL | Status: DC

## 2013-10-08 MED ORDER — ONDANSETRON HCL 4 MG/2ML IJ SOLN: 4.0000 mg | Freq: Once | INTRAMUSCULAR | Status: DC | PRN

## 2013-10-08 MED ORDER — OXYCODONE HCL 5 MG/5ML PO SOLN: 5.0000 mg | Freq: Once | ORAL | Status: DC | PRN

## 2013-10-08 MED ORDER — BUPIVACAINE-EPINEPHRINE 0.5% -1:200000 IJ SOLN
INTRAMUSCULAR | Status: DC | PRN
  Administered 2013-10-08: 3.6 mL

## 2013-10-08 MED ORDER — NALOXONE HCL 0.4 MG/ML IJ SOLN
INTRAMUSCULAR | Status: AC
  Filled 2013-10-08: qty 1

## 2013-10-08 MED ORDER — PROPOFOL 10 MG/ML IV BOLUS
INTRAVENOUS | Status: DC | PRN
  Administered 2013-10-08: 70 mg via INTRAVENOUS
  Administered 2013-10-08: 100 mg via INTRAVENOUS
  Administered 2013-10-08 (×2): 20 mg via INTRAVENOUS

## 2013-10-08 MED ORDER — OXYMETAZOLINE HCL 0.05 % NA SOLN
NASAL | Status: AC
  Filled 2013-10-08: qty 15

## 2013-10-08 MED ORDER — HYDROMORPHONE HCL PF 1 MG/ML IJ SOLN
0.2500 mg | INTRAMUSCULAR | Status: DC | PRN
  Administered 2013-10-08 (×2): 0.5 mg via INTRAVENOUS

## 2013-10-08 MED ORDER — WATER FOR IRRIGATION, STERILE IR SOLN
Status: DC | PRN
Start: 2013-10-08 — End: 2013-10-08
  Administered 2013-10-08: 1000 mL

## 2013-10-08 MED ORDER — ARTIFICIAL TEARS OP OINT
TOPICAL_OINTMENT | OPHTHALMIC | Status: DC | PRN
  Administered 2013-10-08: 1 via OPHTHALMIC

## 2013-10-08 MED ORDER — FENTANYL CITRATE 0.05 MG/ML IJ SOLN
INTRAMUSCULAR | Status: DC | PRN
  Administered 2013-10-08: 100 ug via INTRAVENOUS
  Administered 2013-10-08: 25 ug via INTRAVENOUS
  Administered 2013-10-08: 150 ug via INTRAVENOUS

## 2013-10-08 MED ORDER — DEXAMETHASONE SODIUM PHOSPHATE 10 MG/ML IJ SOLN
INTRAMUSCULAR | Status: DC | PRN
  Administered 2013-10-08: 8 mg via INTRAVENOUS

## 2013-10-08 MED ORDER — FENTANYL CITRATE 0.05 MG/ML IJ SOLN
INTRAMUSCULAR | Status: AC
  Filled 2013-10-08: qty 5

## 2013-10-08 MED ORDER — 0.9 % SODIUM CHLORIDE (POUR BTL) OPTIME
TOPICAL | Status: DC | PRN
  Administered 2013-10-08: 1000 mL

## 2013-10-08 MED ORDER — ALCOHOL (RUBBING) 70 % SOLN
Status: DC | PRN
  Administered 2013-10-08: 20 mL via TOPICAL

## 2013-10-08 MED ORDER — HEMOSTATIC AGENTS (NO CHARGE) OPTIME
TOPICAL | Status: DC | PRN
  Administered 2013-10-08: 1 via TOPICAL

## 2013-10-08 MED ORDER — LIDOCAINE-EPINEPHRINE 2 %-1:100000 IJ SOLN
INTRAMUSCULAR | Status: AC
  Filled 2013-10-08: qty 8.5

## 2013-10-08 MED ORDER — LIDOCAINE-EPINEPHRINE 2 %-1:100000 IJ SOLN
INTRAMUSCULAR | Status: AC
  Filled 2013-10-08: qty 1.7

## 2013-10-08 MED ORDER — ONDANSETRON HCL 4 MG/2ML IJ SOLN
INTRAMUSCULAR | Status: DC | PRN
  Administered 2013-10-08: 4 mg via INTRAVENOUS

## 2013-10-08 MED ORDER — BUPIVACAINE-EPINEPHRINE (PF) 0.5% -1:200000 IJ SOLN
INTRAMUSCULAR | Status: AC
  Filled 2013-10-08: qty 1.8

## 2013-10-08 MED ORDER — LACTATED RINGERS IV SOLN
INTRAVENOUS | Status: DC | PRN
  Administered 2013-10-08: 11:00:00 via INTRAVENOUS

## 2013-10-08 SURGICAL SUPPLY — 36 items
ALCOHOL 70% 16 OZ (MISCELLANEOUS) ×3 IMPLANT
ATTRACTOMAT 16X20 MAGNETIC DRP (DRAPES) ×3 IMPLANT
BLADE 10 SAFETY STRL DISP (BLADE) ×3 IMPLANT
BLADE SURG 15 STRL LF DISP TIS (BLADE) ×2 IMPLANT
BLADE SURG 15 STRL SS (BLADE) ×6
COVER SURGICAL LIGHT HANDLE (MISCELLANEOUS) ×3 IMPLANT
GAUZE PACKING FOLDED 2  STR (GAUZE/BANDAGES/DRESSINGS) ×2
GAUZE PACKING FOLDED 2 STR (GAUZE/BANDAGES/DRESSINGS) ×1 IMPLANT
GAUZE SPONGE 4X4 16PLY XRAY LF (GAUZE/BANDAGES/DRESSINGS) ×3 IMPLANT
GLOVE BIOGEL PI IND STRL 6 (GLOVE) ×1 IMPLANT
GLOVE BIOGEL PI INDICATOR 6 (GLOVE) ×2
GLOVE SURG ORTHO 8.0 STRL STRW (GLOVE) ×3 IMPLANT
GLOVE SURG SS PI 6.0 STRL IVOR (GLOVE) ×3 IMPLANT
GOWN STRL REUS W/ TWL LRG LVL3 (GOWN DISPOSABLE) ×1 IMPLANT
GOWN STRL REUS W/TWL 2XL LVL3 (GOWN DISPOSABLE) ×3 IMPLANT
GOWN STRL REUS W/TWL LRG LVL3 (GOWN DISPOSABLE) ×3
HEMOSTAT SURGICEL .5X2 ABSORB (HEMOSTASIS) IMPLANT
KIT BASIN OR (CUSTOM PROCEDURE TRAY) ×3 IMPLANT
KIT ROOM TURNOVER OR (KITS) ×3 IMPLANT
MANIFOLD NEPTUNE WASTE (CANNULA) ×3 IMPLANT
NDL BLUNT 16X1.5 OR ONLY (NEEDLE) ×1 IMPLANT
NEEDLE BLUNT 16X1.5 OR ONLY (NEEDLE) ×3 IMPLANT
NS IRRIG 1000ML POUR BTL (IV SOLUTION) ×3 IMPLANT
PACK EENT II TURBAN DRAPE (CUSTOM PROCEDURE TRAY) ×3 IMPLANT
PAD ARMBOARD 7.5X6 YLW CONV (MISCELLANEOUS) ×3 IMPLANT
SPONGE SURGIFOAM ABS GEL 100 (HEMOSTASIS) ×2 IMPLANT
SPONGE SURGIFOAM ABS GEL 12-7 (HEMOSTASIS) IMPLANT
SPONGE SURGIFOAM ABS GEL SZ50 (HEMOSTASIS) IMPLANT
SUCTION FRAZIER TIP 10 FR DISP (SUCTIONS) ×3 IMPLANT
SUT CHROMIC 3 0 PS 2 (SUTURE) ×8 IMPLANT
SUT CHROMIC 4 0 P 3 18 (SUTURE) IMPLANT
SYR 50ML SLIP (SYRINGE) ×3 IMPLANT
TOWEL OR 17X26 10 PK STRL BLUE (TOWEL DISPOSABLE) ×3 IMPLANT
TUBE CONNECTING 12'X1/4 (SUCTIONS) ×1
TUBE CONNECTING 12X1/4 (SUCTIONS) ×2 IMPLANT
YANKAUER SUCT BULB TIP NO VENT (SUCTIONS) ×3 IMPLANT

## 2013-10-08 NOTE — Transfer of Care (Signed)
Immediate Anesthesia Transfer of Care Note  Patient: Angel [name]  Procedure(s) Performed: Procedure(s): extraction of tooth #'s 2,3,4,5,6,11,12,13,14,15,18,19,20,21,22,23,24,25,26, 27,28, 55, 31 with alveoloplasty  (N/A)  Patient Location: PACU  Anesthesia Type:General  Level of Consciousness: awake, alert  and oriented  Airway & Oxygen Therapy: Patient connected to face mask oxygen  Post-op Assessment: Report given to PACU RN  Post vital signs: stable  Complications: No apparent anesthesia complications

## 2013-10-08 NOTE — H&P (Signed)
10/08/2013  Patient:            Angel [name] Date of Birth:  [birth year] MRN:                [id number]  BP 150/82  Pulse 73  Temp(Src) 98.8 F (37.1 C) (Oral)  Resp 20  Ht 5' 5.5" (1.664 m)  Wt 114 lb (51.71 kg)  BMI 18.68 kg/m2  SpO2 100%   [name] presents for extraction remaining teeth with alveoloplasty in the operating room with general anesthesia. Patient denies any acute medical or dental changes. Please see H&P from Dr. Redmond Baseman 10/04/2013 for use as H&P for the dental OR procedure.  Lenn Cal, DDS      Patient Information    Patient Name Sex DOB SSN    Angel [name], Angel [name] Male 07/30/52 [id number]     H&P by Melida Quitter, MD at 10/04/2013 11:25 AM    Author: Melida Quitter, MD Service: Surgery Author Type: Physician    Filed: 10/04/2013 11:27 AM Note Time: 10/04/2013 11:25 AM Status: Signed    Editor: Melida Quitter, MD (Physician)        Angel [name] is an 63 y.o. male.   Chief Complaint: neck mass, tongue base cancer HPI: 63 year old male with growing left neck mass for at least the past couple of months.  FNA shows carcinoma and imaging demonstrates nodal masses in both sides of the neck and a tongue base mass.  Presents for surgical biopsy.    Past Medical History   Diagnosis  Date   .  Constipation due to pain medication     .  Cancer         left neck lymph node   .  Current smoker         Past Surgical History   Procedure  Laterality  Date   .  Testicle surgery           as a infant   .  Appendectomy       .  Mandible surgery           from [place]   .  Back surgery           Lumbar   .  Tonsillectomy         History reviewed. No pertinent family history. Social History:  reports that he has been smoking Cigarettes.  He has a 90 pack-year smoking history. He does not have any smokeless tobacco history on file. He reports that he uses illicit drugs (Marijuana). He reports that he does not drink alcohol.  Allergies: No Known Allergies     Medications Prior to Admission   Medication  Sig  Dispense  Refill   .  HYDROcodone-acetaminophen (NORCO/VICODIN) 5-325 MG per tablet  Take 1 tablet by mouth every 4 (four) hours as needed.   20 tablet   0   .  acetaminophen (TYLENOL) 500 MG tablet  Take 1,000 mg by mouth 3 (three) times daily as needed (for pain).           No results found for this or any previous visit (from the past 48 hour(s)). No results found.  Review of Systems  HENT: Positive for sore throat.   All other systems reviewed and are negative.   Blood pressure 131/85, pulse 91, temperature 97.7 F (36.5 C), temperature source Oral, resp. rate 18, weight 53.071 kg (117 lb), SpO2 99.00%. Physical Exam  Constitutional: He  is oriented to person, place, and time. He appears well-developed and well-nourished. No distress.  HENT:   Head: Normocephalic and atraumatic.  Right Ear: External ear normal.  Left Ear: External ear normal.   Nose: Nose normal.   Mouth/Throat: Oropharynx is clear and moist.  Eyes: Conjunctivae and EOM are normal. Pupils are equal, round, and reactive to light.  Neck: Normal range of motion. Neck supple.  Large, firm left neck mass, smaller right zone 2 mass.  Cardiovascular: Normal rate.   Respiratory: Effort normal.  Musculoskeletal: Normal range of motion.  Neurological: He is alert and oriented to person, place, and time. No cranial nerve deficit.  Skin: Skin is warm and dry.  Psychiatric: He has a normal mood and affect. His behavior is normal. Judgment and thought content normal.     Assessment/Plan Tongue base carcinoma, neck metastases To OR for DL with biopsy and esophagoscopy.  Melida Quitter

## 2013-10-08 NOTE — Progress Notes (Signed)
PRE-OPERATIVE NOTE:  10/08/2013 Angel [name] [id number]  VITALS: BP 150/82  Pulse 73  Temp(Src) 98.8 F (37.1 C) (Oral)  Resp 20  Ht 5' 5.5" (1.664 m)  Wt 114 lb (51.71 kg)  BMI 18.68 kg/m2  SpO2 100%  Lab Results  Component Value Date   WBC 15.8* 10/05/2013   HGB 11.5* 10/05/2013   HCT 34.8* 10/05/2013   MCV 100.6* 10/05/2013   PLT 495* 10/05/2013   BMET    Component Value Date/Time   NA 142 10/05/2013 1602   NA 134* 09/29/2013 1318   K 3.9 10/05/2013 1602   K 5.1 09/29/2013 1318   CL 98 09/29/2013 1318   CO2 30* 10/05/2013 1602   CO2 27 09/29/2013 1318   GLUCOSE 104 10/05/2013 1602   GLUCOSE 111* 09/29/2013 1318   BUN 14.1 10/05/2013 1602   BUN 12 09/29/2013 1318   CREATININE 0.8 10/05/2013 1602   CREATININE 0.64 09/29/2013 1318   CALCIUM 9.5 10/05/2013 1602   CALCIUM 9.8 09/29/2013 1318   GFRNONAA >90 09/29/2013 1318   GFRAA >90 09/29/2013 1318    No results found for this basename: INR, PROTIME   No results found for this basename: PTT     Angel [name] presents for extraction of remaining teeth with alveoloplasty in the operating room with general anesthesia.    SUBJECTIVE: The patient denies any acute medical or dental changes and agrees to proceed with treatment as planned.  EXAM: No sign of acute dental changes.  ASSESSMENT: Patient is affected by  chronic apical periodontitis, multiple dental caries, chronic periodontitis, multiple retained root segments, and multiple defective restorations.   PLAN: Patient agrees to proceed with treatment as planned in the operating room as previously discussed and accepts the risks, benefits, complications of the proposed treatment. The patient is aware of the potential for complications to include pain, bleeding, swelling, infection, bruising, nerve damage, root tip fracture, mandible fracture, sinus involvement, and complications associated with general anesthesia including airway compromise. Patient is aware of the potential for other  complications not mentioned above.   Lenn Cal, DDS

## 2013-10-08 NOTE — Anesthesia Preprocedure Evaluation (Addendum)
Anesthesia Evaluation  Patient identified by MRN, date of birth, ID band Patient awake    Reviewed: Allergy & Precautions, H&P , NPO status , Patient's Chart, lab work & pertinent test results, reviewed documented beta blocker date and time   Airway Mallampati: III TM Distance: >3 FB Neck ROM: Full    Dental  (+) Dental Advisory Given, Poor Dentition, Missing, Loose   Pulmonary Current Smoker,  breath sounds clear to auscultation        Cardiovascular negative cardio ROS  Rhythm:Regular     Neuro/Psych    GI/Hepatic negative GI ROS, Neg liver ROS,   Endo/Other  negative endocrine ROS  Renal/GU negative Renal ROS     Musculoskeletal negative musculoskeletal ROS (+)   Abdominal (+)  Abdomen: soft. Bowel sounds: normal.  Peds  Hematology  (+) anemia ,   Anesthesia Other Findings   Reproductive/Obstetrics negative OB ROS                      Anesthesia Physical Anesthesia Plan  ASA: II  Anesthesia Plan: General   Post-op Pain Management:    Induction: Intravenous and Rapid sequence  Airway Management Planned: Video Laryngoscope Planned and Oral ETT  Additional Equipment:   Intra-op Plan:   Post-operative Plan: Extubation in OR  Informed Consent: I have reviewed the patients History and Physical, chart, labs and discussed the procedure including the risks, benefits and alternatives for the proposed anesthesia with the patient or authorized representative who has indicated his/her understanding and acceptance.     Plan Discussed with: CRNA and Surgeon  Anesthesia Plan Comments: (Anesthesia induction from Tuesday noted. Discussed with Dr Redmond Baseman who will be on standby for a tracheostomy.)    Anesthesia Quick Evaluation

## 2013-10-08 NOTE — Anesthesia Procedure Notes (Addendum)
Procedure Name: Intubation Date/Time: 10/08/2013 11:06 AM Performed by: Maeola Harman Pre-anesthesia Checklist: Patient identified, Emergency Drugs available, Suction available, Patient being monitored and Timeout performed Patient Re-evaluated:Patient Re-evaluated prior to inductionOxygen Delivery Method: Circle system utilized Preoxygenation: Pre-oxygenation with 100% oxygen Intubation Type: IV induction and Rapid sequence Ventilation: Mask ventilation without difficulty Grade View: Grade II Tube type: Oral Tube size: 7.5 mm Number of attempts: 1 Airway Equipment and Method: Video-laryngoscopy and Stylet Placement Confirmation: ETT inserted through vocal cords under direct vision,  positive ETCO2 and breath sounds checked- equal and bilateral Secured at: 21 cm Tube secured with: Tape Dental Injury: Teeth and Oropharynx as per pre-operative assessment  Difficulty Due To: Difficulty was anticipated, Difficult Airway- due to anterior larynx, Difficult Airway- due to immobile epiglottis, Difficult Airway- due to reduced neck mobility and Difficult Airway-  due to edematous airway Future Recommendations: Recommend- induction with short-acting agent, and alternative techniques readily available Comments: Atraumatic induction and intubation with RSI and Video-laryngoscope, Grade II view with adult MAC blade.  Ett passed easily with steep curve on tube.   Dr Conrad [place] verified placement of ETT.  Waldron Session, CRNA

## 2013-10-08 NOTE — Op Note (Signed)
Patient:            Angel [name] Date of Birth:  1950-11-19 MRN:                [id number]   DATE OF PROCEDURE:  10/08/2013               OPERATIVE REPORT   PREOPERATIVE DIAGNOSES: 1. Squamous cell carcinoma of left base of tongue 2. Pre-chemoradiation therapy dental protocol 3. Chronic apical periodontitis 4. Chronic periodontitis 5 multiple retained root segments 6. Multiple dental caries 7. Periapical cyst area #28  POSTOPERATIVE DIAGNOSES: 1. Squamous cell carcinoma of left base of tongue 2. Pre-chemoradiation therapy dental protocol 3. Chronic apical periodontitis 4. Chronic periodontitis 5 multiple retained root segments 6. Multiple dental caries 7. Periapical cyst area #28  OPERATIONS: 1. Multiple extraction of tooth numbers 2, 3, 4, 5, 6, 11, 12, 13, 14, 15, 18, 19, 20, 21, 22, 23, 24, 25, 26, 27, 28, 29, and 31 2. 4 Quadrants of alveoloplasty 3. Enucleation and Submission of cyst area number 28 for pathology to rule out malignancy   SURGEON: Lenn Cal, DDS  ASSISTANT: Camie Patience, (dental assistant)  ANESTHESIA: General anesthesia via nasoendotracheal tube.  MEDICATIONS: 1. Ancef 2 g IV prior to invasive dental procedures. 2. Local anesthesia with a total utilization of 5 carpules each containing 34 mg of lidocaine with 0.017 mg of epinephrine as well as 2 carpules each containing 9 mg of bupivacaine with 0.009 mg of epinephrine.  SPECIMENS: There are 23 teeth that were discarded.  DRAINS: None  CULTURES: None  COMPLICATIONS: None   ESTIMATED BLOOD LOSS: 100 mLs.  INTRAVENOUS FLUIDS: 1200 mLs of Lactated ringers solution.  INDICATIONS: The patient was recently diagnosed with squamous cell carcinoma right base of tongue.  A medically necessary dental consultation was then requested prior to anticipated chemoradiation therapy.   The patient was examined and treatment planned for extraction remaining teeth with alveoloplasty and pre-prosthetic  surgery as indicated.  This treatment plan was formulated to decrease the risks and complications associated with dental infection from affecting the patient's systemic health and to prevent future complications such as osteoradionecrosis.  OPERATIVE FINDINGS: Patient was examined operating room number 11.  The teeth were identified for extraction. The patient was noted be affected by chronic periodontitis, chronic apical periodontitis, multiple retained root segments, dental caries, and the presence of periapical cyst area #28.   DESCRIPTION OF PROCEDURE: Patient was brought to the main operating room number 11. Patient was then placed in the supine position on the operating table. General anesthesia was then induced per the anesthesia team. The patient was then prepped and draped in the usual manner for dental medicine procedure. A timeout was performed. The patient was identified and procedures were verified. A throat pack was placed at this time. The oral cavity was then thoroughly examined with the findings noted above. The patient was then ready for dental medicine procedure as follows:  Local anesthesia was then administered sequentially with a total utilization of 5 carpules each containing 34 mg of lidocaine with 0.017 mg of epinephrine as well as 2 carpules  each containing 9 mg bupivacaine with 0.009 mg of epinephrine.  The Maxillary left and right quadrants first approached. Anesthesia was then delivered utilizing infiltration with lidocaine with epinephrine. A #15 blade incision was then made from the maxillary right tuberosity and extended to the maxillary left tuberosity.  A  surgical flap was then carefully reflected. Appropriate amounts of  buccal and interseptal bone were then removed utilizing a surgical handpiece and bur and copious amounts of sterile water.  The teeth were then subluxated with a series of straight elevators. Tooth numbers 2, 3 were then removed with a 53R forceps  without complications. Tooth numbers 4, 5, 6, 11, 12, 13, 14, and 15 were then removed with a 150 forceps without complications. Alveoloplasty was then performed utilizing a ronguers and bone file. The surgical site was then irrigated with copious amounts of sterile saline. The tissues were approximated and trimmed appropriately. A piece of Surgifoam was placed the extraction socket area #3. The surgical site was then closed from the maxillary right tuberosity and extended the mesial numbers 8 utilizing 3-0 chromic gut suture in a continuous interrupted suture technique x1. The maxillary left surgical site was then closed from the maxillary left tuberosity and extended the mesial #9 utilizing 3-0 chromic gut suture in a continuous interrupted suture technique x1.  At this point time, the mandibular quadrants were approached. The patient was given bilateral inferior alveolar nerve blocks and long buccal nerve blocks utilizing the bupivacaine with epinephrine. Further infiltration was then achieved utilizing the lidocaine with epinephrine. A 15 blade incision was then made from the distal of number 17 and extended to the distal of #32.  A surgical flap was then carefully reflected. Appropriate amounts of buccal and interseptal bone were then removed utilizing a surgical handpiece and copious amount of sterile water. Tooth numbers  20, 21, 22, 23, 24, 25, 26, 27, 28, 20 and 29 were then removed with a 151 forceps without complications. Tooth numbers 18, and 19, and 31 then had the coronal aspect removed with a 23 forceps leaving roots remaining. Further bone was then removed a surgical handpiece and bur and copious amounts sterile water and the remaining roots were then elevated out without complication utilizing a set of cryers elevators. At this point time a 1 cm cyst in the area of #28 was removed utilizing a curet carefully. The cyst was then submitted to pathology rule out malignancy although this most likely  represents a periapical cyst secondary to infection in the area of #28 .  Alveoloplasty was then performed utilizing a rongeurs and bone file. The tissues were approximated and trimmed appropriately. The surgical sites were then irrigated with copious amounts of sterile saline. The mandibular left surgical site was then closed from the distal of  17 and extended the mesial numbers 25 utilizing 3-0 chromic gut suture in a continuous interrupted suture technique x1. The mandibular right surgical site was then closed from the distal of #32 and extended the mesial numbers 25 utilizing 3-0 chromic gut suture in a continuous interrupted suture technique x1.   At this point time, the entire mouth was irrigated with copious amounts of sterile saline. The patient was examined for complications, seeing none, the dental medicine procedure was deemed to be complete. The throat pack was removed at this time. A series of 4 x 4 gauze were placed in the mouth to aid hemostasis. An oral airway was placed at the request of the anesthesia team. The patient was then handed over to the anesthesia team for final disposition. After an appropriate amount of time, the patient was extubated and taken to the postanesthsia care unit with stable vital signs and a good condition. All counts were correct for the dental medicine procedure. Patient will be given Percocet pain medication postoperatively. Patient is to return to dental medicine clinic on 10/18/2013  at 9:45 am for suture removal.   Lenn Cal, DDS.

## 2013-10-08 NOTE — Discharge Instructions (Signed)
MOUTH CARE AFTER SURGERY ° °FACTS: °· Ice used in ice bag helps keep the swelling down, and can help lessen the pain. °· It is easier to treat pain BEFORE it happens. °· Spitting disturbs the clot and may cause bleeding to start again, or to get worse. °· Smoking delays healing and can cause complications. °· Sharing prescriptions can be dangerous.  Do not take medications not recently prescribed for you. °· Antibiotics may stop birth control pills from working.  Use other means of birth control while on antibiotics. °· Warm salt water rinses after the first 24 hours will help lessen the swelling:  Use 1/2 teaspoonful of table salt per oz.of water. ° °DO NOT: °· Do not spit.  Do not drink through a straw. °· Strongly advised not to smoke, dip snuff or chew tobacco at least for 3 days. °· Do not eat sharp or crunchy foods.  Avoid the area of surgery when chewing. °· Do not stop your antibiotics before your instructions say to do so. °· Do not eat hot foods until bleeding has stopped.  If you need to, let your food cool down to room temperature. ° °EXPECT: °· Some swelling, especially first 2-3 days. °· Soreness or discomfort in varying degrees.  Follow your dentist's instructions about how to handle pain before it starts. °· Pinkish saliva or light blood in saliva, or on your pillow in the morning.  This can last around 24 hours. °· Bruising inside or outside the mouth.  This may not show up until 2-3 days after surgery.  Don't worry, it will go away in time. °· Pieces of "bone" may work themselves loose.  It's OK.  If they bother you, let us know. ° °WHAT TO DO IMMEDIATELY AFTER SURGERY: °· Bite on the gauze with steady pressure for 1-2 hours.  Don't chew on the gauze. °· Do not lie down flat.  Raise your head support especially for the first 24 hours. °· Apply ice to your face on the side of the surgery.  You may apply it 20 minutes on and a few minutes off.  Ice for 8-12 hours.  You may use ice up to 24  hours. °· Before the numbness wears off, take a pain pill as instructed. °· Prescription pain medication is not always required. ° °SWELLING: °· Expect swelling for the first couple of days.  It should get better after that. °· If swelling increases 3 days or so after surgery; let us know as soon as possible. ° °FEVER: °· Take Tylenol every 4 hours if needed to lower your temperature, especially if it is at 100F or higher. °· Drink lots of fluids. °· If the fever does not go away, let us know. ° °BREATHING TROUBLE: °· Any unusual difficulty breathing means you have to have someone bring you to the emergency room ASAP ° °BLEEDING: °· Light oozing is expected for 24 hours or so. °· Prop head up with pillows °· Avoid spitting °· Do not confuse bright red fresh flowing blood with lots of saliva colored with a little bit of blood. °· If you notice some bleeding, place gauze or a tea bag where it is bleeding and apply CONSTANT pressure by biting down for 1 hour.  Avoid talking during this time.  Do not remove the gauze or tea bag during this hour to "check" the bleeding. °· If you notice bright RED bleeding FLOWING out of particular area, and filling the floor of your mouth, put   a wad of gauze on that area, bite down firmly and constantly.  Call us immediately.  If we're closed, have someone bring you to the emergency room. ° °ORAL HYGIENE: °· Brush your teeth as usual after meals and before bedtime. °· Use a soft toothbrush around the area of surgery. °· DO NOT AVOID BRUSHING.  Otherwise bacteria(germs) will grow and may delay healing or encourage infection. °· Since you cannot spit, just gently rinse and let the water flow out of your mouth. °· DO NOT SWISH HARD. ° °EATING: °· Cool liquids are a good point to start.  Increase to soft foods as tolerated. ° °PRESCRIPTIONS: °· Follow the directions for your prescriptions exactly as written. °· If Dr. Kulinski gave you a narcotic pain medication, do not drive, operate  machinery or drink alcohol when on that medication. ° °QUESTIONS: °· Call our office during office hours [phone] or call the Emergency Room at [phone]. ° °What to Eat after Tooth extraction:   ° ° °For your first meals, you should eat lightly; only small meals at first.   Avoid Sharp, Crunchy, and Hot foods.   If you do not have nausea, you may eat larger meals.  Avoid spicy, greasy and heavy food, as these may make you sick after the anesthesia.  °General Anesthesia, Adult, Care After  °Refer to this sheet in the next few weeks. These instructions provide you with information on caring for yourself after your procedure. Your health care provider may also give you more specific instructions. Your treatment has been planned according to current medical practices, but problems sometimes occur. Call your health care provider if you have any problems or questions after your procedure.  °WHAT TO EXPECT AFTER THE PROCEDURE  °After the procedure, it is typical to experience:  °Sleepiness.  °Nausea and vomiting. °HOME CARE INSTRUCTIONS  °For the first 24 hours after general anesthesia:  °Have a responsible person with you.  °Do not drive a car. If you are alone, do not take public transportation.  °Do not drink alcohol.  °Do not take medicine that has not been prescribed by your health care provider.  °Do not sign important papers or make important decisions.  °You may resume a normal diet and activities as directed by your health care provider.  °Change bandages (dressings) as directed.  °If you have questions or problems that seem related to general anesthesia, call the hospital and ask for the anesthetist or anesthesiologist on call. °SEEK MEDICAL CARE IF:  °You have nausea and vomiting that continue the day after anesthesia.  °You develop a rash. °SEEK IMMEDIATE MEDICAL CARE IF:  °You have difficulty breathing.  °You have chest pain.  °You have any allergic problems. °Document Released: 07/22/2000 Document  Revised: 12/16/2012 Document Reviewed: 10/29/2012  °[employer]® Patient Information ©2014 [employer].  ° ° °

## 2013-10-08 NOTE — Anesthesia Postprocedure Evaluation (Signed)
Anesthesia Post Note  Patient: Angel [name]  Procedure(s) Performed: Procedure(s) (LRB): extraction of tooth #'s 2,3,4,5,6,11,12,13,14,15,18,19,20,21,22,23,24,25,26, 27,28, 29, 31 with alveoloplasty  (N/A)  Anesthesia type: general  Patient location: PACU  Post pain: Pain level controlled  Post assessment: Patient's Cardiovascular Status Stable  Last Vitals:  Filed Vitals:   10/08/13 1454  BP: 167/81  Pulse:   Temp:   Resp:     Post vital signs: Reviewed and stable  Level of consciousness: sedated  Complications: No apparent anesthesia complications

## 2013-10-08 NOTE — Progress Notes (Signed)
Patients wife wondered why the patient was not given any antibiotic prescription for discharge.  Patient was given 2 g of Ancef during the procedure.  Patients wife was informed.  Attempted to call Dr. Enrique Sack, office is closed.  Patients wife advised to report to the ED if she feels that he has an infection.

## 2013-10-11 ENCOUNTER — Encounter (HOSPITAL_COMMUNITY): Payer: Self-pay | Admitting: Pharmacy Technician

## 2013-10-11 ENCOUNTER — Encounter: Payer: Self-pay | Admitting: *Deleted

## 2013-10-11 ENCOUNTER — Other Ambulatory Visit: Payer: Self-pay | Admitting: Radiology

## 2013-10-11 ENCOUNTER — Encounter (HOSPITAL_COMMUNITY): Payer: Self-pay | Admitting: Dentistry

## 2013-10-11 NOTE — Progress Notes (Signed)
Patient was asleep, did not want to wake

## 2013-10-11 NOTE — Progress Notes (Signed)
Head and Neck Cancer Location of Tumor / Histology:Invasive Squamous Cell Carcinoma of the Base of Tongue  Patient presented to Dr. Redmond Baseman on 09/23/13 concerning a steadily increasing mass with drainage(lower part) on the left side of his neck for ~ 6-8 weeks prior to this visit. He reported "soreness of the lower throat, increased difficulty swallowing, clearing throat frequently, and mild hoarseness in the am.  CT of Neck from 09/17/13 Reveals"  There is a large, centrally necrotic mass deep to the left  sternocleidomastoid spanning levels II and III and measuring 6.6 x  5.5 x 6.2 cm (AP by transverse by craniocaudal). Finding is  compatible with a necrotic nodal mass. Necrotic left level II lymph  node superior to this dominant mass measures 2.5 x 2.2 cm. Necrotic  right level II lymph nodes measure up to 2.2 x 1.9 cm in size. Right  level III lymph nodes measure up to 6 mm and short axis. A mildly  asymmetric left supraclavicular lymph node measures 6 mm.   There is a large enhancing mass in the tongue base measuring 3.7 x  3.5 x 2.9 cm (transverse by AP by craniocaudal). Mass extends into  the vallecula and superior aspect of the pre epiglottic space.   Biopsies of Tongue (if applicable) revealed:   10/04/13 Diagnosis 1. Tongue, biopsy, left base - INVASIVE SQUAMOUS CELL CARCINOMA. - LYMPHOVASCULAR INVASION IS IDENTIFIED. - SEE COMMENT. 2. Oropharynx, biopsy, Vallecula - INVASIVE SQUAMOUS CELL CARCINOMA  09/23/13 Diagnosis NECK, FINE NEEDLE ASPIRATION, LEFT, (SPECIMEN 1 OF 1 COLLECTED ON 09/23/13): MALIGNANT CELLS PRESENT SEE COMMENT COMMENT: THE MALIGNANT CELLS ARE CONSISTENT WITH SQUAMOUS CELL CARCINOMA. THE CASE WAS REVIEWED WITH DR HILLARD WHO CONCURS.  Nutrition Status:  Weight changes: 5-10 lbs as of Sep 23, 2013. Weight 106.0 today  Weight 116 on 10/08/2013  Swallowing status: "soreness of lower throat with increased difficulty swallowing." Eating pudding, jello, mashed  potatoes and ensure  Plans, if any, for PEG tube: ?  Tobacco/Marijuana/Snuff/ETOH use: 2 PPD x 40 years, former Heavy alcohol drinker, Use of Marijuana  Past/Anticipated interventions by otolaryngology, if any: Biopsy by Dr. Redmond Baseman  Past/Anticipated interventions by medical oncology, if any: Dr. [name] on 10/05/13 - To start chemotherapy  Referrals yet, to any of the following?  Social Work? 10/14/15  Dentistry? 10/18/13 - all teeth removed  Swallowing therapy?   Nutrition? 10/13/13  Med/Onc? Dr.Gorsuch 10/05/13  PEG placement?  SAFETY ISSUES:  Prior radiation? No  Pacemaker/ICD? No  Possible current pregnancy? N/A  Is the patient on methotrexate? No  Current Complaints / other details: Chills and sweating last night  Intolerance to Morphine - nausea.  Vicodin in past, but ineffective relief.   Relief with Percocet  Oral mucoas pink and intact.  Note stitches at gum line post extractions.

## 2013-10-12 ENCOUNTER — Telehealth: Payer: Self-pay | Admitting: *Deleted

## 2013-10-12 ENCOUNTER — Other Ambulatory Visit: Payer: Self-pay | Admitting: Hematology and Oncology

## 2013-10-12 DIAGNOSIS — C01 Malignant neoplasm of base of tongue: Secondary | ICD-10-CM

## 2013-10-12 MED ORDER — MORPHINE SULFATE 15 MG PO TABS: 15.0000 mg | ORAL_TABLET | ORAL | Status: DC | PRN

## 2013-10-12 NOTE — Telephone Encounter (Signed)
Called patient to see if he had any questions prior to his attendance at tomorrow's [facility], spoke with his wife.  I reviewed with her the format of the clinic, verified an arrival request of 12:00, reviewed arrival/registration process.  She verbalized understanding.  She stated she had placed a refill request for oxycodone-acetaminophen 5-325 mg with Dr. Calton Dach office this morning.  Patient has 7 tablets remaining, taking Q4.  I will follow-up for her.  Gayleen Orem, RN, BSN, [facility] On[facility] Navigator [phone]

## 2013-10-12 NOTE — Progress Notes (Addendum)
Radiation Oncology         [phone]) [phone] ________________________________  Initial outpatient Consultation  Name: Angel [name] MRN: [id number]  Date: 10/13/2013  DOB: 02/16/1951  GU:RKYHCW, Vernon Prey, MD  [name], MD   REFERRING PHYSICIAN: [name], MD  DIAGNOSIS: Base of tongue squamous cell carcinoma, stageT2N3Mx, at least stage IVB  HISTORY OF PRESENT ILLNESS::Marcellous Q Rinn is a 63 y.o. male with history of tobacco and ETOH abuse who presented with a growing left neck mass.  [facility] who referred him to an ENT surgeon.  CT scan on 5-22 revealed a large tongue base malignancy with necrotic, bulky metastatic cervical lymphadenopathy, left greater than right. Per tumor board discussion, there is concern of  palate involvement on CT as well.    FNA of left neck on 5-28 revealed squamous cell carcinoma  Laryngoscopy under anesthesia by Dr Redmond Baseman on 6-8 revealed a firm mass within the left tongue base extending past the midline and encompassing much of the tongue base. There is extension of the mass into the vallecula and on to the lingual surface of the epiglottis. The remaining pharynx and larynx are normal as is the cervical esophagus.  Biopsy of the vallecula and base of tongue demomstrated p16 negative squamous cell carcinoma.  Symptomatically, he reports left neck pain and requests Percocet currently. He has lost about 10 pounds over the past few weeks. He reports odynophagia. He is eating soft foods and nutritional shakes. He reports chills or night sweats last night but is afebrile today. He had full dental extractions last week. PET scan is pending.  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    PAST SURGICAL HISTORY: Past Surgical  History  Procedure Laterality Date  . Testicle surgery      as a infant  . Appendectomy    . Mandible surgery      from [place]  . Back surgery      Lumbar  . Tonsillectomy    . Panendoscopy N/A 10/04/2013    Procedure: Direct Laryngoscopy  WITH BIOPSY;  Surgeon: Melida Quitter, MD;  Location: [facility];  Service: ENT;  Laterality: N/A;  . Esophagoscopy  10/04/2013    Procedure: ESOPHAGOSCOPY;  Surgeon: Melida Quitter, MD;  Location: [facility];  Service: ENT;;  . Multiple extractions with alveoloplasty N/A 10/08/2013    Procedure: extraction of tooth #'s 2,3,4,5,6,11,12,13,14,15,18,19,20,21,22,23,24,25,26, 27,28, 29, 31 with alveoloplasty ;  Surgeon: Lenn Cal, DDS;  Location: [facility];  Service: Oral Surgery;  Laterality: N/A;    FAMILY HISTORY: family history includes Cancer in his father and mother.  SOCIAL HISTORY:  reports that he has been smoking Cigarettes.  He has a 90 pack-year smoking history. He has never used smokeless tobacco. He reports that he uses illicit drugs (Marijuana). He reports that he does not drink alcohol.  ALLERGIES: Review of patient's allergies indicates no known allergies.  MEDICATIONS:  Current Outpatient Prescriptions  Medication Sig Dispense Refill  . oxyCODONE-acetaminophen (PERCOCET/ROXICET) 5-325 MG per tablet Take 1 tablet by mouth every 4 (four) hours as needed for severe pain.      Marland Kitchen morphine (MSIR) 15 MG tablet Take 1 tablet (15 mg total) by mouth every 4 (four) hours as needed for severe pain.  30 tablet  0  No current facility-administered medications for this encounter.    REVIEW OF SYSTEMS:  Notable for that above.   PHYSICAL EXAM:  height is 5' 5.5" (1.664 m) and weight is 106 lb (48.081 kg). His temperature is 98.8 F (37.1 C). His blood pressure is 133/91 and his pulse is 106. His oxygen saturation is 98%.   General: Alert and oriented, in no acute distress HEENT: Head is normocephalic.  Extraocular movements are intact. Oropharynx -  edentulous, recent extractions, no tonsil or obvious palate lesions Neck: 2cm level II right neck mass, >6cm left neck mass eroding through skin with diffuse skin erythema. Heart: Regular in rate and rhythm with no murmurs, rubs, or gallops. Chest: course expiratory sounds bilaterally Abdomen: Soft, nontender, nondistended, with no rigidity or guarding. Extremities: No cyanosis or edema. Lymphatics:as above Skin: as above Musculoskeletal: symmetric strength and muscle tone throughout. Neurologic: Cranial nerves II through XII are grossly intact. No obvious focalities. Speech is fluent. Coordination is intact. Psychiatric: Judgment and insight are intact. Affect is appropriate.   ECOG =2  0 - Asymptomatic (Fully active, able to carry on all predisease activities without restriction)  1 - Symptomatic but completely ambulatory (Restricted in physically strenuous activity but ambulatory and able to carry out work of a light or sedentary nature. For example, light housework, office work)  2 - Symptomatic, <50% in bed during the day (Ambulatory and capable of all self care but unable to carry out any work activities. Up and about more than 50% of waking hours)  3 - Symptomatic, >50% in bed, but not bedbound (Capable of only limited self-care, confined to bed or chair 50% or more of waking hours)  4 - Bedbound (Completely disabled. Cannot carry on any self-care. Totally confined to bed or chair)  5 - Death   Eustace Pen MM, Creech RH, Tormey DC, et al. [phone]). "Toxicity and response criteria of the [facility]". [place] Oncol. 5 (6): 649-55   LABORATORY DATA:  Lab Results  Component Value Date   WBC 15.8* 10/05/2013   HGB 11.5* 10/05/2013   HCT 34.8* 10/05/2013   MCV 100.6* 10/05/2013   PLT 495* 10/05/2013   CMP     Component Value Date/Time   NA 142 10/05/2013 1602   NA 134* 09/29/2013 1318   K 3.9 10/05/2013 1602   K 5.1 09/29/2013 1318   CL 98 09/29/2013 1318   CO2 30*  10/05/2013 1602   CO2 27 09/29/2013 1318   GLUCOSE 104 10/05/2013 1602   GLUCOSE 111* 09/29/2013 1318   BUN 14.1 10/05/2013 1602   BUN 12 09/29/2013 1318   CREATININE 0.8 10/05/2013 1602   CREATININE 0.64 09/29/2013 1318   CALCIUM 9.5 10/05/2013 1602   CALCIUM 9.8 09/29/2013 1318   PROT 6.4 10/05/2013 1602   PROT 7.3 09/29/2013 1318   ALBUMIN 2.9* 10/05/2013 1602   ALBUMIN 3.4* 09/29/2013 1318   AST 8 10/05/2013 1602   AST 11 09/29/2013 1318   ALT 7 10/05/2013 1602   ALT 6 09/29/2013 1318   ALKPHOS 47 10/05/2013 1602   ALKPHOS 70 09/29/2013 1318   BILITOT 0.21 10/05/2013 1602   BILITOT <0.2* 09/29/2013 1318   GFRNONAA >90 09/29/2013 1318   GFRAA >90 09/29/2013 1318         RADIOGRAPHY: Ct Soft Tissue Neck W Contrast  09/17/2013   CLINICAL DATA:  Left neck mass.  EXAM: CT NECK WITH CONTRAST  TECHNIQUE: Multidetector CT imaging of the neck was performed using  the standard protocol following the bolus administration of intravenous contrast.  CONTRAST:  142m OMNIPAQUE IOHEXOL 300 MG/ML  SOLN  COMPARISON:  None.  FINDINGS: The visualized portion of the brain demonstrates cerebral atrophy orbits are unremarkable there is trace right maxillary sinus mucosal thickening. Mastoid air cells are clear.  There is a large, centrally necrotic mass deep to the left sternocleidomastoid spanning levels II and III and measuring 6.6 x 5.5 x 6.2 cm (AP by transverse by craniocaudal). Finding is compatible with a necrotic nodal mass. Necrotic left level II lymph node superior to this dominant mass measures 2.5 x 2.2 cm. Necrotic right level II lymph nodes measure up to 2.2 x 1.9 cm in size. Right level III lymph nodes measure up to 6 mm and short axis. A mildly asymmetric left supraclavicular lymph node measures 6 mm.  There is a large enhancing mass in the tongue base measuring 3.7 x 3.5 x 2.9 cm (transverse by AP by craniocaudal). Mass extends into the vallecula and superior aspect of the pre epiglottic space.  The thyroid is unremarkable. There is  prominent, diffuse enhancement of the salivary glands and the lymphoid tissue of the neck. No submandibular or parotid mass is identified. Visualized lung apices demonstrate very mild scarring as well as a 3 mm nodule in the apical right upper lobe (series 4, image 98). There is moderate to advanced multilevel degenerative disc disease in the cervical spine. Moderate facet arthrosis is present on the left at C3-4.  IMPRESSION: Large tongue base malignancy with necrotic, bulky metastatic cervical lymphadenopathy, left greater than right.   Electronically Signed   By: ALogan Bores  On: 09/17/2013 14:42      IMPRESSION/PLAN: This is a delightful 63year old man with at least stage IVB squamous cell carcinoma of the base of tongue; PET is pending, HPV status negative, positive ETOH abuse /  positive smoking history. The patient is a good candidate for induction chemotherapy followed by radiotherapy. The patient has been discussed in detail at tumor board and seen in the context of multidisciplinary clinic today. Plan is as below:   1) The patient has met with med/onc to discuss chemotherapy - anticipate induction Chemotherapy in light of bulk of disease.  This would be followed by RT if disease is Stage IVA and not IVB.  1a) PET pending for 6-19. If this revealed distant metastases, it could change our recommendations, as discussed with the patient   2) Referral has been made to dentistry for dental evaluation/extractions in preparation for radiation in the vicinity of the mouth and his extractions have been completed.  3) Today in multidisciplinary clinic the patient will see LPolo Rileyfrom social work for social support  4) Today in multidisciplinary clinic he will see nutrition for nutrition support - at this time we do not anticipate a PEG tube.   5) NO PEG recommended upfront as RT after TPF would be given without concurrent chemotherapy.  6) Dr GAlvy Bimlerhas referred to swallowing therapy for  evaluation and prophylactic treatment as needed for dysphagia, which can worsen during or after chemoradiotherapy.   7) [facility] / neck measurements due to risk of lymphedema in neck; may benefit from PT for this after completion of radiotherapy. The patient also may benefit from this for potentional deconditioning after treatment    8) Simulation will not be scheduled until he is cleared by med/onc and dentistry. Anticipate 7 weeks of RT - 70  Gy in 35 fractions.    It was a pleasure meeting the patient today. We discussed the risks, benefits, and side effects of radiotherapy.   We talked about acute and late effects. He understands that we will review this information when he is referred back to me. At that time, a consent form may be signed and placed in the patient's medical record. The patient is enthusiastic about proceeding with treatment. I look forward to eventually participating in the patient's care and will await his re-referral by Dr Alvy Bimler.    __________________________________________   Eppie Gibson, MD

## 2013-10-13 ENCOUNTER — Ambulatory Visit: Payer: Medicaid Other | Admitting: Nutrition

## 2013-10-13 ENCOUNTER — Ambulatory Visit: Payer: Medicaid Other | Admitting: Physical Therapy

## 2013-10-13 ENCOUNTER — Encounter: Payer: Self-pay | Admitting: *Deleted

## 2013-10-13 ENCOUNTER — Ambulatory Visit
Admission: RE | Admit: 2013-10-13 | Discharge: 2013-10-13 | Disposition: A | Discharge status: Home / Self Care | Payer: Medicaid Other | Source: Ambulatory Visit | Attending: Radiation Oncology | Admitting: Radiation Oncology

## 2013-10-13 ENCOUNTER — Encounter: Payer: Self-pay | Admitting: Radiation Oncology

## 2013-10-13 ENCOUNTER — Other Ambulatory Visit: Payer: Self-pay | Admitting: Hematology and Oncology

## 2013-10-13 VITALS — BP 133/91 | HR 106 | Temp 98.8°F | Ht 65.5 in | Wt 106.0 lb

## 2013-10-13 DIAGNOSIS — C029 Malignant neoplasm of tongue, unspecified: Secondary | ICD-10-CM | POA: Insufficient documentation

## 2013-10-13 DIAGNOSIS — C01 Malignant neoplasm of base of tongue: Secondary | ICD-10-CM

## 2013-10-13 DIAGNOSIS — F1010 Alcohol abuse, uncomplicated: Secondary | ICD-10-CM | POA: Insufficient documentation

## 2013-10-13 DIAGNOSIS — F1210 Cannabis abuse, uncomplicated: Secondary | ICD-10-CM | POA: Insufficient documentation

## 2013-10-13 DIAGNOSIS — F17200 Nicotine dependence, unspecified, uncomplicated: Secondary | ICD-10-CM | POA: Insufficient documentation

## 2013-10-13 DIAGNOSIS — C779 Secondary and unspecified malignant neoplasm of lymph node, unspecified: Secondary | ICD-10-CM

## 2013-10-13 MED ORDER — OXYCODONE-ACETAMINOPHEN 5-325 MG PO TABS
1.0000 | ORAL_TABLET | Freq: Once | ORAL | Status: AC
  Administered 2013-10-13: 1 via ORAL
  Filled 2013-10-13: qty 1

## 2013-10-13 NOTE — Progress Notes (Signed)
reasessed pain level, patioent states"It's getting less a 6/10 scale now, turned upper light off, patient offered blanket,pillow, declined ,wife at side , 2:23 PM

## 2013-10-13 NOTE — Progress Notes (Signed)
Nutrition consult, during head and neck clinic.  63-year-old male diagnosed with tongue cancer.  He is a patient of Dr. Gorsuch and Dr. Squire.  Past medical history includes constipation, and polysubstance abuse (alcohol, tobacco, snuff, and marijuana).  Medications include morphine and oxycodone.  Labs include albumin 2.9.  Height: 65-1/2 inches. Weight: 106 pounds. Usual body weight: 116 pounds May 2015. Patient reports usual body weight 125 pounds. BMI: 17.36.  I met with patient and wife during head and neck clinic.  Patient has had increased difficulty swallowing.  He has pain at the site of this tumor.  He has been unable to eat much.  He is status post dental extractions.  Noted plan for potential induction chemotherapy.  Dietary recall reveals patient is able to eat 1/4 bottle of ensure, a little milk, some pudding or Jell-O.  Provided patient with fact sheets.  Encouraged smaller, more frequent oral intake.  Reviewed ways to add extra calories.  Provided oral nutrition supplement samples, and coupons.  Questions were answered.  Teach back method used.  Patient meets criteria for severe malnutrition in the context of acute illness secondary to 9% weight loss in one month and less than 50% of energy intake for greater than 5 days.  Patient also has moderate to severe depletion of both body fat and muscle mass.  Estimated nutrition needs: 1650-1850 calories, 75-90 g protein, 1.8 L fluid.  Nutrition diagnosis: Unintended weight loss related to inadequate oral intake as evidenced by 15% weight loss from usual body weight and BMI of 17.36.  Intervention: Patient educated to continue soft, moist diet as tolerated.  Recommended patient try to pure foods and then add sauces and gravies.  Recommended patient increase Ensure Plus to goal rate of 4-5 cans daily by mouth.    Recommend patient be scheduled for feeding tube placement secondary to severe malnutrition, underweight, and inability  to increase oral intake with newly diagnosed tongue cancer.  Monitoring, evaluation, goals: Patient will tolerate increased oral intake to minimize further weight loss.  Next visit: Wednesday, July 1, during chemotherapy.     

## 2013-10-13 NOTE — Progress Notes (Addendum)
Head & Neck Multidisciplinary Clinic Clinical Social Work  Clinical Social Work met with patient/family and radiation oncologist at head & neck multidisciplinary clinic to offer support and assess for psychosocial needs.  Radiation oncologist reviewed patient's diagnosis and recommended treatment plan with patient/family.  Patient reports he is currently experiencing pain from recent teeth extractions, but he is eager to begin chemotherapy.  Patient and spouse report no concerns at this time.  The patient is currently applying for social security disability and medicaid.  CSW reviewed distress screen with patient/family- patient shared most immediate cause of distress is pain- medical team notified.  ONCBCN DISTRESS SCREENING 10/13/2013  Screening Type Initial Screening  Elta Gu[place] the number that describes how much distress you have been experiencing in the past week 8  Practical problem type Insurance;Food  Physical Problem type Pain;Mouth sores/swallowing;Constipation/diarrhea  Referral to clinical social work Yes    Clinical Social Work briefly discussed Clinical Social Work role and [employer] support programs/services.  Clinical Social Work encouraged patient to call with any additional questions or concerns.   Polo Riley, MSW, LCSW, OSW-C Clinical Social Worker [facility] [phone]

## 2013-10-13 NOTE — Progress Notes (Signed)
Verbal order from Dr.Squire to give patient percocet 5/325mg  1 tablet once, patient stated pain an "8/10" scale,left side of neck and around back of neck, 1 tablet 5/325mg  percocet given, with water 1:54 PM

## 2013-10-13 NOTE — Progress Notes (Signed)
Per Gayleen Orem, RN,  Pt told him that the Morphine is not working for his pain and he requests rx for percocet from Dr. Alvy Bimler.  Dr. Alvy Bimler instructs pt to take two MS IR 15 mg if needed and add tylenol 650 mg w/ the morphine.   Spoke w/ pt in lobby.  He reports the Morphine gave him a headache and made him nauseated.  He took it on an empty stomach.  Instructed pt to add tylenol 650 mg to morphine.  Take tylenol as directed on bottle,  Do not exceed recommended dose.  Also instructed to take with food.  Do not take morphine on empty stomach.  If pain not relieved after one hour, then take another MS IR 15 mg tablet.   If pain only relieved after two MS IR tabs then he may take 2 at a time prn pain per Dr. Alvy Bimler.  Please Inform Dr. Calton Dach nurse tomorrow how this works for him.  He verbalized understanding.

## 2013-10-14 ENCOUNTER — Other Ambulatory Visit: Payer: Self-pay

## 2013-10-14 ENCOUNTER — Encounter (HOSPITAL_COMMUNITY): Payer: Self-pay

## 2013-10-14 ENCOUNTER — Ambulatory Visit (HOSPITAL_BASED_OUTPATIENT_CLINIC_OR_DEPARTMENT_OTHER): Payer: Medicaid Other | Admitting: Hematology and Oncology

## 2013-10-14 ENCOUNTER — Ambulatory Visit (HOSPITAL_COMMUNITY)
Admission: RE | Admit: 2013-10-14 | Discharge: 2013-10-14 | Disposition: A | Discharge status: Home / Self Care | Payer: Medicaid Other | Source: Ambulatory Visit | Attending: Hematology and Oncology | Admitting: Hematology and Oncology

## 2013-10-14 ENCOUNTER — Encounter: Payer: Self-pay | Admitting: Hematology and Oncology

## 2013-10-14 ENCOUNTER — Encounter: Payer: Self-pay | Admitting: *Deleted

## 2013-10-14 ENCOUNTER — Telehealth: Payer: Self-pay | Admitting: Hematology and Oncology

## 2013-10-14 ENCOUNTER — Other Ambulatory Visit: Payer: Self-pay | Admitting: Hematology and Oncology

## 2013-10-14 VITALS — BP 119/79 | HR 84 | Temp 98.2°F | Resp 18 | Ht 65.5 in | Wt 106.9 lb

## 2013-10-14 VITALS — BP 106/77 | HR 84 | Temp 97.1°F | Resp 18

## 2013-10-14 DIAGNOSIS — C01 Malignant neoplasm of base of tongue: Secondary | ICD-10-CM

## 2013-10-14 DIAGNOSIS — M542 Cervicalgia: Secondary | ICD-10-CM

## 2013-10-14 DIAGNOSIS — Z5309 Procedure and treatment not carried out because of other contraindication: Secondary | ICD-10-CM | POA: Diagnosis not present

## 2013-10-14 DIAGNOSIS — D630 Anemia in neoplastic disease: Secondary | ICD-10-CM

## 2013-10-14 DIAGNOSIS — C779 Secondary and unspecified malignant neoplasm of lymph node, unspecified: Secondary | ICD-10-CM

## 2013-10-14 DIAGNOSIS — F17200 Nicotine dependence, unspecified, uncomplicated: Secondary | ICD-10-CM

## 2013-10-14 DIAGNOSIS — C029 Malignant neoplasm of tongue, unspecified: Secondary | ICD-10-CM | POA: Insufficient documentation

## 2013-10-14 DIAGNOSIS — Z720 Tobacco use: Secondary | ICD-10-CM

## 2013-10-14 DIAGNOSIS — R220 Localized swelling, mass and lump, head: Secondary | ICD-10-CM | POA: Insufficient documentation

## 2013-10-14 DIAGNOSIS — R221 Localized swelling, mass and lump, neck: Secondary | ICD-10-CM

## 2013-10-14 LAB — PROTIME-INR
INR: 1.08 (ref 0.00–1.49)
PROTHROMBIN TIME: 13.8 s (ref 11.6–15.2)

## 2013-10-14 LAB — CBC
HEMATOCRIT: 35.7 % — AB (ref 39.0–52.0)
HEMOGLOBIN: 12.2 g/dL — AB (ref 13.0–17.0)
MCH: 33.9 pg (ref 26.0–34.0)
MCHC: 34.2 g/dL (ref 30.0–36.0)
MCV: 99.2 fL (ref 78.0–100.0)
Platelets: 598 10*3/uL — ABNORMAL HIGH (ref 150–400)
RBC: 3.6 MIL/uL — AB (ref 4.22–5.81)
RDW: 14.7 % (ref 11.5–15.5)
WBC: 19.5 10*3/uL — ABNORMAL HIGH (ref 4.0–10.5)

## 2013-10-14 LAB — APTT: APTT: 30 s (ref 24–37)

## 2013-10-14 MED ORDER — CEFAZOLIN SODIUM-DEXTROSE 2-3 GM-% IV SOLR: 2.0000 g | INTRAVENOUS | Status: DC

## 2013-10-14 MED ORDER — SODIUM CHLORIDE 0.9 % IV SOLN: INTRAVENOUS | Status: DC

## 2013-10-14 MED ORDER — MORPHINE SULFATE 15 MG PO TABS: 15.0000 mg | ORAL_TABLET | ORAL | Status: DC | PRN

## 2013-10-14 NOTE — Assessment & Plan Note (Signed)
This is due to cancer. I renewed his pain medication prescription and warned him about side effects.

## 2013-10-14 NOTE — Progress Notes (Signed)
[facility] OFFICE PROGRESS NOTE  Patient Care Team: [name], MD as PCP - General (Hematology and Oncology)  SUMMARY OF ONCOLOGIC HISTORY:   Cancer of base of tongue   09/17/2013 Imaging CT scan of the neck showed large bilateral neck lymphadenopathy with associated tongue mass.   09/23/2013 Procedure Fine needle aspirate of the left neck mass came back positive for squamous cell carcinoma.   10/04/2013 Surgery Laryngoscopy showed a firm mass within the left tongue base extending past the midline and encompassing much of the tongue base.  There is extension of the mass into the vallecula and on to the lingual surface of the epiglottis.   10/04/2013 Pathology Vallecula and tongue biopsy confirmed squamous cell carcinoma.    INTERVAL HISTORY: Please see below for problem oriented charting. He complained of moderate neck pain.  REVIEW OF SYSTEMS:   Constitutional: Denies fevers, chills or abnormal weight loss Eyes: Denies blurriness of vision Ears, nose, mouth, throat, and face: Denies mucositis or sore throat Respiratory: Denies cough, dyspnea or wheezes Cardiovascular: Denies palpitation, chest discomfort or lower extremity swelling Gastrointestinal:  Denies nausea, heartburn or change in bowel habits Skin: Denies abnormal skin rashes Lymphatics: Denies new lymphadenopathy or easy bruising Neurological:Denies numbness, tingling or new weaknesses Behavioral/Psych: Mood is stable, no new changes  All other systems were reviewed with the patient and are negative.  I have reviewed the past medical history, past surgical history, social history and family history with the patient and they are unchanged from previous note.  ALLERGIES:  has No Known Allergies.  MEDICATIONS:  Current Outpatient Prescriptions  Medication Sig Dispense Refill  . morphine (MSIR) 15 MG tablet Take 1 tablet (15 mg total) by mouth every 4 (four) hours as needed for severe pain.  60 tablet  0   No  current facility-administered medications for this visit.   Facility-Administered Medications Ordered in Other Visits  Medication Dose Route Frequency Provider Last Rate Last Dose  . 0.9 %  sodium chloride infusion   Intravenous Continuous D Kevin Allred, PA-C      . ceFAZolin (ANCEF) IVPB 2 g/50 mL premix  2 g Intravenous On Call D Kevin Allred, PA-C        PHYSICAL EXAMINATION: ECOG PERFORMANCE STATUS: 1 - Symptomatic but completely ambulatory  Filed Vitals:   10/14/13 1510  BP: 119/79  Pulse: 84  Temp: 98.2 F (36.8 C)  Resp: 18   Filed Weights   10/14/13 1510  Weight: 106 lb 14.4 oz (48.49 kg)    GENERAL:alert, no distress and comfortable SKIN: skin color, texture, turgor are normal, no rashes or significant lesions EYES: normal, Conjunctiva are pink and non-injected, sclera clear OROPHARYNX:no exudate, no erythema and lips, buccal mucosa, and tongue normal. All his teeth were pulled NECK: A very large mass in the left of the neck with ongoing drainage  Musculoskeletal:no cyanosis of digits and no clubbing  NEURO: alert & oriented x 3 with fluent speech, no focal motor/sensory deficits  LABORATORY DATA:  I have reviewed the data as listed    Component Value Date/Time   NA 142 10/05/2013 1602   NA 134* 09/29/2013 1318   K 3.9 10/05/2013 1602   K 5.1 09/29/2013 1318   CL 98 09/29/2013 1318   CO2 30* 10/05/2013 1602   CO2 27 09/29/2013 1318   GLUCOSE 104 10/05/2013 1602   GLUCOSE 111* 09/29/2013 1318   BUN 14.1 10/05/2013 1602   BUN 12 09/29/2013 1318   CREATININE 0.8  10/05/2013 1602   CREATININE 0.64 09/29/2013 1318   CALCIUM 9.5 10/05/2013 1602   CALCIUM 9.8 09/29/2013 1318   PROT 6.4 10/05/2013 1602   PROT 7.3 09/29/2013 1318   ALBUMIN 2.9* 10/05/2013 1602   ALBUMIN 3.4* 09/29/2013 1318   AST 8 10/05/2013 1602   AST 11 09/29/2013 1318   ALT 7 10/05/2013 1602   ALT 6 09/29/2013 1318   ALKPHOS 47 10/05/2013 1602   ALKPHOS 70 09/29/2013 1318   BILITOT 0.21 10/05/2013 1602   BILITOT <0.2* 09/29/2013 1318    GFRNONAA >90 09/29/2013 1318   GFRAA >90 09/29/2013 1318    No results found for this basename: SPEP, UPEP,  kappa and lambda light chains    Lab Results  Component Value Date   WBC 19.5* 10/14/2013   NEUTROABS 11.0* 10/05/2013   HGB 12.2* 10/14/2013   HCT 35.7* 10/14/2013   MCV 99.2 10/14/2013   PLT 598* 10/14/2013      Chemistry      Component Value Date/Time   NA 142 10/05/2013 1602   NA 134* 09/29/2013 1318   K 3.9 10/05/2013 1602   K 5.1 09/29/2013 1318   CL 98 09/29/2013 1318   CO2 30* 10/05/2013 1602   CO2 27 09/29/2013 1318   BUN 14.1 10/05/2013 1602   BUN 12 09/29/2013 1318   CREATININE 0.8 10/05/2013 1602   CREATININE 0.64 09/29/2013 1318      Component Value Date/Time   CALCIUM 9.5 10/05/2013 1602   CALCIUM 9.8 09/29/2013 1318   ALKPHOS 47 10/05/2013 1602   ALKPHOS 70 09/29/2013 1318   AST 8 10/05/2013 1602   AST 11 09/29/2013 1318   ALT 7 10/05/2013 1602   ALT 6 09/29/2013 1318   BILITOT 0.21 10/05/2013 1602   BILITOT <0.2* 09/29/2013 1318     ASSESSMENT & PLAN:  Cancer of base of tongue PET CT scan is pending. If he has no evidence of distant metastatic disease, he will be a good candidate for induction chemotherapy followed by radiation treatment. We discussed the role of chemotherapy. The intent is for cure. We discussed some of the risks, benefits, side-effects of 5FU/Docetaxel and cisplatin. Some of the short term side-effects included, though not limited to, including weight loss, life threatening infections, risk of allergic reactions, need for transfusions of blood products, nausea, vomiting, change in bowel habits, loss of hair, admission to hospital for various reasons, and risks of death.   Long term side-effects are also discussed including risks of infertility, permanent damage to nerve function, hearing loss, chronic fatigue, kidney damage with possibility needing hemodialysis, and rare secondary malignancy including bone marrow disorders.  The patient is aware that the response rates  discussed earlier is not guaranteed.  After a long discussion, patient made an informed decision to proceed with the prescribed plan of care and went ahead to sign the consent form today.   Patient education material was dispensed.    Tobacco abuse I spent some time counseling the patient the importance of tobacco cessation. he is currently attempting to quit on his own   Anemia in neoplastic disease Recent workup such as anemia chronic disease. He is not symptomatic. Recommend observation only.  Neck pain This is due to cancer. I renewed his pain medication prescription and warned him about side effects.   No orders of the defined types were placed in this encounter.   All questions were answered. The patient knows to call the clinic with any problems, questions or concerns.  No barriers to learning was detected. I spent 40 minutes counseling the patient face to face. The total time spent in the appointment was 60 minutes and more than 50% was on counseling and review of test results     [facility], [place], MD 10/14/2013 7:31 PM

## 2013-10-14 NOTE — Assessment & Plan Note (Signed)
PET CT scan is pending. If he has no evidence of distant metastatic disease, he will be a good candidate for induction chemotherapy followed by radiation treatment. We discussed the role of chemotherapy. The intent is for cure. We discussed some of the risks, benefits, side-effects of 5FU/Docetaxel and cisplatin. Some of the short term side-effects included, though not limited to, including weight loss, life threatening infections, risk of allergic reactions, need for transfusions of blood products, nausea, vomiting, change in bowel habits, loss of hair, admission to hospital for various reasons, and risks of death.   Long term side-effects are also discussed including risks of infertility, permanent damage to nerve function, hearing loss, chronic fatigue, kidney damage with possibility needing hemodialysis, and rare secondary malignancy including bone marrow disorders.  The patient is aware that the response rates discussed earlier is not guaranteed.  After a long discussion, patient made an informed decision to proceed with the prescribed plan of care and went ahead to sign the consent form today.   Patient education material was dispensed.

## 2013-10-14 NOTE — Addendum Note (Signed)
Encounter addended by: Eppie Gibson, MD on: 10/14/2013 11:15 AM<BR>     Documentation filed: Notes Section

## 2013-10-14 NOTE — Assessment & Plan Note (Signed)
I spent some time counseling the patient the importance of tobacco cessation. he is currently attempting to quit on his own 

## 2013-10-14 NOTE — Progress Notes (Signed)
Patient ID: Angel [name], male   DOB: [birth year], 63 y.o.   MRN: [id number] Pt presented today to IR for port a cath placement (hx squamous cell ca tongue ). Pt has enlarged erythematous left neck mass which is draining and tender to palpation. WBC is 19.5. Pt is AF. Case d/w Drs. Watts/Gorsuch. Will cancel port placement today and plan PICC closer to date of planned chemotherapy. Plan d/w pt/family.

## 2013-10-14 NOTE — Telephone Encounter (Signed)
gv and printed appt sched and avs for pt for June and July....sed added tx. °

## 2013-10-14 NOTE — Progress Notes (Signed)
To provide support and care continuity, met with patient and his wife during [facility], having previously met them during initial consult with Dr. Alvy Bimler.   Patient reported that the morphine prescribed 10/12/13 by Dr. Alvy Bimler is not providing relief, called/spoke with Cameo,Dr. Gorsuch's RN, directed him to lobby for follow-up with Dr. Alvy Bimler.  Continuing navigation as L1 patient (new patient).  Gayleen Orem, RN, BSN, [facility] Head & Neck Oncology Navigator [phone]

## 2013-10-14 NOTE — Assessment & Plan Note (Signed)
Recent workup such as anemia chronic disease. He is not symptomatic. Recommend observation only.

## 2013-10-14 NOTE — Addendum Note (Signed)
Encounter addended by: Brooks Sailors, RN on: 10/14/2013  7:13 AM<BR>     Documentation filed: Notes Section

## 2013-10-14 NOTE — Progress Notes (Signed)
Dr.Watts states pt's procedure (portacath placement) is being canceled today, Pt is okay to leave.  Pt was informed of this and informed of his other appointments at the [facility] this afternoon.  Pt d/c, ambulatory with wife.

## 2013-10-15 ENCOUNTER — Other Ambulatory Visit: Payer: Self-pay | Admitting: Hematology and Oncology

## 2013-10-15 ENCOUNTER — Encounter (HOSPITAL_COMMUNITY)
Admission: RE | Admit: 2013-10-15 | Discharge: 2013-10-15 | Disposition: A | Discharge status: Home / Self Care | Payer: Medicaid Other | Source: Ambulatory Visit | Attending: Hematology and Oncology | Admitting: Hematology and Oncology

## 2013-10-15 ENCOUNTER — Telehealth: Payer: Self-pay | Admitting: *Deleted

## 2013-10-15 DIAGNOSIS — C760 Malignant neoplasm of head, face and neck: Secondary | ICD-10-CM

## 2013-10-15 DIAGNOSIS — I2510 Atherosclerotic heart disease of native coronary artery without angina pectoris: Secondary | ICD-10-CM | POA: Insufficient documentation

## 2013-10-15 DIAGNOSIS — I700 Atherosclerosis of aorta: Secondary | ICD-10-CM | POA: Insufficient documentation

## 2013-10-15 DIAGNOSIS — C029 Malignant neoplasm of tongue, unspecified: Secondary | ICD-10-CM | POA: Insufficient documentation

## 2013-10-15 DIAGNOSIS — R918 Other nonspecific abnormal finding of lung field: Secondary | ICD-10-CM | POA: Insufficient documentation

## 2013-10-15 DIAGNOSIS — C01 Malignant neoplasm of base of tongue: Secondary | ICD-10-CM

## 2013-10-15 DIAGNOSIS — J479 Bronchiectasis, uncomplicated: Secondary | ICD-10-CM | POA: Insufficient documentation

## 2013-10-15 DIAGNOSIS — C770 Secondary and unspecified malignant neoplasm of lymph nodes of head, face and neck: Secondary | ICD-10-CM | POA: Diagnosis not present

## 2013-10-15 DIAGNOSIS — D539 Nutritional anemia, unspecified: Secondary | ICD-10-CM

## 2013-10-15 LAB — GLUCOSE, CAPILLARY: Glucose-Capillary: 147 mg/dL — ABNORMAL HIGH (ref 70–99)

## 2013-10-15 MED ORDER — DEXAMETHASONE 4 MG PO TABS: ORAL_TABLET | ORAL | Status: DC

## 2013-10-15 MED ORDER — FLUDEOXYGLUCOSE F - 18 (FDG) INJECTION
6.4000 | Freq: Once | INTRAVENOUS | Status: AC | PRN
  Administered 2013-10-15: 6.4 via INTRAVENOUS

## 2013-10-15 MED ORDER — PROCHLORPERAZINE MALEATE 10 MG PO TABS: 10.0000 mg | ORAL_TABLET | Freq: Four times a day (QID) | ORAL | Status: DC | PRN

## 2013-10-15 MED ORDER — ONDANSETRON HCL 8 MG PO TABS: 8.0000 mg | ORAL_TABLET | Freq: Three times a day (TID) | ORAL | Status: DC | PRN

## 2013-10-15 NOTE — Progress Notes (Signed)
To provide support, encouragement and care continuity, met with patient and his wife during appt with Dr. Alvy Bimler.  I reinforced their understanding of the chemo regime being prescribed, provided emotional support for concerns re: tomorrow's PET.  Continuing navigation as L1 patient (new patient).  Gayleen Orem, RN, BSN, [facility] Head & Neck Oncology Navigator [phone]

## 2013-10-15 NOTE — Telephone Encounter (Signed)
Per Dr. Calton Dach guidance, I called patient to let him know of results of this morning's PET.  He expressed appreciation for the good news.  Gayleen Orem, RN, BSN, [facility] Head & Neck Oncology Navigator [phone]

## 2013-10-18 ENCOUNTER — Encounter: Payer: Self-pay | Admitting: Hematology and Oncology

## 2013-10-18 ENCOUNTER — Ambulatory Visit (HOSPITAL_COMMUNITY): Payer: Medicaid - Dental | Admitting: Dentistry

## 2013-10-18 ENCOUNTER — Encounter (HOSPITAL_COMMUNITY): Payer: Self-pay | Admitting: Dentistry

## 2013-10-18 ENCOUNTER — Encounter (INDEPENDENT_AMBULATORY_CARE_PROVIDER_SITE_OTHER): Payer: Self-pay

## 2013-10-18 VITALS — BP 115/80 | HR 118 | Temp 97.8°F

## 2013-10-18 DIAGNOSIS — K0820 Unspecified atrophy of edentulous alveolar ridge: Secondary | ICD-10-CM

## 2013-10-18 DIAGNOSIS — Z0189 Encounter for other specified special examinations: Secondary | ICD-10-CM

## 2013-10-18 DIAGNOSIS — C01 Malignant neoplasm of base of tongue: Secondary | ICD-10-CM

## 2013-10-18 DIAGNOSIS — K000 Anodontia: Secondary | ICD-10-CM

## 2013-10-18 DIAGNOSIS — K08109 Complete loss of teeth, unspecified cause, unspecified class: Secondary | ICD-10-CM

## 2013-10-18 DIAGNOSIS — K08409 Partial loss of teeth, unspecified cause, unspecified class: Secondary | ICD-10-CM

## 2013-10-18 MED ORDER — CHLORHEXIDINE GLUCONATE 0.12 % MT SOLN: OROMUCOSAL | Status: DC

## 2013-10-18 NOTE — Patient Instructions (Addendum)
1. Continue salt water rinses as needed to aid healing. 2. Use chlorhexidine rinses twice daily as prescribed. Prescription was sent over to [facility] long outpatient pharmacy with PRN refills. 3. Continue sinus precautions as previously delivered to the patient. 4.  Return to clinic for periodic oral examination once radiation therapy starts. 5. Call if problems arise with healing before then or if symptoms of sinus problems arise. 6. Maintain soft diet and use of Ensure Plus and Boost as needed.  Lenn Cal, DDS     MOUTH CARE AFTER SURGERY  FACTS:  Ice used in ice bag helps keep the swelling down, and can help lessen the pain.  It is easier to treat pain BEFORE it happens.  Spitting disturbs the clot and may cause bleeding to start again, or to get worse.  Smoking delays healing and can cause complications.  Sharing prescriptions can be dangerous.  Do not take medications not recently prescribed for you.  Antibiotics may stop birth control pills from working.  Use other means of birth control while on antibiotics.  Warm salt water rinses after the first 24 hours will help lessen the swelling:  Use 1/2 teaspoonful of table salt per oz.of water.  DO NOT:  Do not spit.  Do not drink through a straw.  Strongly advised not to smoke, dip snuff or chew tobacco at least for 3 days.  Do not eat sharp or crunchy foods.  Avoid the area of surgery when chewing.  Do not stop your antibiotics before your instructions say to do so.  Do not eat hot foods until bleeding has stopped.  If you need to, let your food cool down to room temperature.  EXPECT:  Some swelling, especially first 2-3 days.  Soreness or discomfort in varying degrees.  Follow your dentist's instructions about how to handle pain before it starts.  Pinkish saliva or light blood in saliva, or on your pillow in the morning.  This can last around 24 hours.  Bruising inside or outside the mouth.  This may  not show up until 2-3 days after surgery.  Don't worry, it will go away in time.  Pieces of "bone" may work themselves loose.  It's OK.  If they bother you, let us know.  WHAT TO DO IMMEDIATELY AFTER SURGERY:  Bite on the gauze with steady pressure for 1-2 hours.  Don't chew on the gauze.  Do not lie down flat.  Raise your head support especially for the first 24 hours.  Apply ice to your face on the side of the surgery.  You may apply it 20 minutes on and a few minutes off.  Ice for 8-12 hours.  You may use ice up to 24 hours.  Before the numbness wears off, take a pain pill as instructed.  Prescription pain medication is not always required.  SWELLING:  Expect swelling for the first couple of days.  It should get better after that.  If swelling increases 3 days or so after surgery; let us know as soon as possible.  FEVER:  Take Tylenol every 4 hours if needed to lower your temperature, especially if it is at 100F or higher.  Drink lots of fluids.  If the fever does not go away, let us know.  BREATHING TROUBLE:  Any unusual difficulty breathing means you have to have someone bring you to the emergency room ASAP  BLEEDING:  Light oozing is expected for 24 hours or so.  Prop head up with  pillows  Avoid spitting  Do not confuse bright red fresh flowing blood with lots of saliva colored with a little bit of blood.  If you notice some bleeding, place gauze or a tea bag where it is bleeding and apply CONSTANT pressure by biting down for 1 hour.  Avoid talking during this time.  Do not remove the gauze or tea bag during this hour to "check" the bleeding.  If you notice bright RED bleeding FLOWING out of particular area, and filling the floor of your mouth, put a wad of gauze on that area, bite down firmly and constantly.  Call us immediately.  If we're closed, have someone bring you to the emergency room.  ORAL HYGIENE:  Brush your teeth as usual after meals and before  bedtime.  Use a soft toothbrush around the area of surgery.  DO NOT AVOID BRUSHING.  Otherwise bacteria(germs) will grow and may delay healing or encourage infection.  Since you cannot spit, just gently rinse and let the water flow out of your mouth.  DO NOT SWISH HARD.  EATING:  Cool liquids are a good point to start.  Increase to soft foods as tolerated.  PRESCRIPTIONS:  Follow the directions for your prescriptions exactly as written.  If Dr. Enrique Sack gave you a narcotic pain medication, do not drive, operate machinery or drink alcohol when on that medication.  QUESTIONS:  Call our office during office hours [phone] or call the Emergency Room at [phone].

## 2013-10-18 NOTE — Progress Notes (Signed)
POST OPERATIVE NOTE:  10/18/2013 Angel [name] [id number]  VITALS: BP 115/80  Pulse 118  Temp(Src) 97.8 F (36.6 C) (Oral)  LABS:  Lab Results  Component Value Date   WBC 19.5* 10/14/2013   HGB 12.2* 10/14/2013   HCT 35.7* 10/14/2013   MCV 99.2 10/14/2013   PLT 598* 10/14/2013   BMET    Component Value Date/Time   NA 142 10/05/2013 1602   NA 134* 09/29/2013 1318   K 3.9 10/05/2013 1602   K 5.1 09/29/2013 1318   CL 98 09/29/2013 1318   CO2 30* 10/05/2013 1602   CO2 27 09/29/2013 1318   GLUCOSE 104 10/05/2013 1602   GLUCOSE 111* 09/29/2013 1318   BUN 14.1 10/05/2013 1602   BUN 12 09/29/2013 1318   CREATININE 0.8 10/05/2013 1602   CREATININE 0.64 09/29/2013 1318   CALCIUM 9.5 10/05/2013 1602   CALCIUM 9.8 09/29/2013 1318   GFRNONAA >90 09/29/2013 1318   GFRAA >90 09/29/2013 1318    Lab Results  Component Value Date   INR 1.08 10/14/2013   No results found for this basename: PTT     Angel [name] is status post extraction of remaining teeth with alveoloplasty and 10/15/2013 in the operating room with general anesthesia.  SUBJECTIVE: Patient has some discomfort from the dental extraction sites. Patient indicates that stitches still remain. Patient indicates that he is supposed to start chemotherapy on 10/27/2013 with Dr. Alvy Bimler. The patient denies having any sinus discomfort or problems. I reviewed the pathology results indicating the presence of a dentigerous cyst in the area of #28.    EXAM: There is no sign of oral infection, heme, or ooze. Sutures are loosely intact. Patient has generalized primary closure with several areas that will need to heal in by secondary intention. There is no evidence of oral antral fistula and Valsalva maneuver was negative. Left neck mass as before.  PROCEDURE: The patient was given a chlorhexidine gluconate rinse for 30 seconds. Sutures were then removed without complication. Patient tolerated the procedure well. I obtained an orthopantogram to evaluate  edentulous state and area of previous dentigerous cyst.  ASSESSMENT: Post operative course is consistent with dental procedures performed in the operating room. Dentigerous cyst area #28 The patient is now edentulous. The patient has atrophy of the edentulous alveolar ridges. Radiograph indicates patient is now edentulous with evidence of recent dental extractions and radiolucent area consistent with previous dentigerous cyst in the area of tooth #28. Evidence of wires to treat previous mandible fractures are noted bilaterally along the right and left border of the mandible.  PLAN: 1. Continue saltwater rinses as needed to aid healing. 2. Use chlorhexidine rinses twice daily as prescribed. Prescription was sent over to [facility] long outpatient pharmacy with PRN refills. 3. Continue sinus precautions as previously delivered to the patient. 4.  Return to clinic for periodic oral examination once radiation therapy starts. 5. Call problems arise with healing before then or if symptoms of sinus problems arise.  Lenn Cal, DDS

## 2013-10-18 NOTE — Progress Notes (Signed)
Put wife's fmla form on nurse's desk. °

## 2013-10-19 ENCOUNTER — Encounter: Payer: Self-pay | Admitting: Hematology and Oncology

## 2013-10-19 ENCOUNTER — Telehealth: Payer: Self-pay | Admitting: *Deleted

## 2013-10-19 NOTE — Progress Notes (Signed)
Cddp/taxol and 5 fu are not replaceable drugs.

## 2013-10-19 NOTE — Progress Notes (Signed)
Faxed fmla form to [place] @ [phone]

## 2013-10-19 NOTE — Telephone Encounter (Signed)
Returned patient's wife's call re: FMLA documentation not being received by [employer] as of last Friday, 10/18/13.  I informed that papers had been faxed this morning, encouraged her to call [employer] to confirm.  She verbalized understanding.  In response to my inquiry, she reported that he is maintaining pain control with MSIR 15 mg tab, taking as frequently as prescribed.  Continuing navigation as L1 patient (new patient).  Gayleen Orem, RN, BSN, [facility] Head & Neck Oncology Navigator [phone]

## 2013-10-20 NOTE — Progress Notes (Signed)
This encounter was created in error - please disregard.

## 2013-10-22 ENCOUNTER — Telehealth: Payer: Self-pay | Admitting: *Deleted

## 2013-10-22 NOTE — Telephone Encounter (Signed)
Called patient to check on well-being, spoke with his wife, [name].  She reported that "today has been better", i.e has been out of bed today.  Pain is controlled with 15 mg MSIR.  I reviewed next week's appts, she confirmed understanding.  She stated she will bring in completed forms and necessary documentation for financial grant application and give them to me on Tuesday.  She stated, also, that [employer] is asking for further documentation re: past absences, she will bring me those forms as well.  She denied any needs for her or her husband; I encouraged her to call me if that changes before we meet next week.  She agreed.  Continuing navigation as L1 patient (new patient).  Gayleen Orem, RN, BSN, [facility] Head & Neck Oncology Navigator [phone]

## 2013-10-25 ENCOUNTER — Encounter (HOSPITAL_COMMUNITY): Payer: Self-pay | Admitting: Emergency Medicine

## 2013-10-25 ENCOUNTER — Encounter: Payer: Self-pay | Admitting: *Deleted

## 2013-10-25 ENCOUNTER — Inpatient Hospital Stay (HOSPITAL_COMMUNITY)
Admission: EM | Admit: 2013-10-25 | Discharge: 2013-11-01 | DRG: 146 | Disposition: A | Discharge status: Home / Self Care | Payer: Medicaid Other | Attending: Internal Medicine | Admitting: Internal Medicine

## 2013-10-25 ENCOUNTER — Encounter: Payer: Self-pay | Admitting: Specialist

## 2013-10-25 DIAGNOSIS — E43 Unspecified severe protein-calorie malnutrition: Secondary | ICD-10-CM | POA: Diagnosis present

## 2013-10-25 DIAGNOSIS — F1210 Cannabis abuse, uncomplicated: Secondary | ICD-10-CM | POA: Diagnosis present

## 2013-10-25 DIAGNOSIS — IMO0001 Reserved for inherently not codable concepts without codable children: Secondary | ICD-10-CM

## 2013-10-25 DIAGNOSIS — G893 Neoplasm related pain (acute) (chronic): Secondary | ICD-10-CM | POA: Diagnosis present

## 2013-10-25 DIAGNOSIS — C01 Malignant neoplasm of base of tongue: Secondary | ICD-10-CM | POA: Diagnosis present

## 2013-10-25 DIAGNOSIS — F17200 Nicotine dependence, unspecified, uncomplicated: Secondary | ICD-10-CM | POA: Diagnosis present

## 2013-10-25 DIAGNOSIS — E860 Dehydration: Secondary | ICD-10-CM | POA: Diagnosis present

## 2013-10-25 DIAGNOSIS — Z79899 Other long term (current) drug therapy: Secondary | ICD-10-CM

## 2013-10-25 DIAGNOSIS — R1310 Dysphagia, unspecified: Secondary | ICD-10-CM | POA: Diagnosis present

## 2013-10-25 DIAGNOSIS — E440 Moderate protein-calorie malnutrition: Secondary | ICD-10-CM | POA: Diagnosis present

## 2013-10-25 DIAGNOSIS — R4702 Dysphasia: Secondary | ICD-10-CM

## 2013-10-25 DIAGNOSIS — R404 Transient alteration of awareness: Secondary | ICD-10-CM

## 2013-10-25 DIAGNOSIS — Z299 Encounter for prophylactic measures, unspecified: Secondary | ICD-10-CM

## 2013-10-25 DIAGNOSIS — T50995A Adverse effect of other drugs, medicaments and biological substances, initial encounter: Secondary | ICD-10-CM | POA: Diagnosis present

## 2013-10-25 DIAGNOSIS — R5381 Other malaise: Secondary | ICD-10-CM | POA: Diagnosis present

## 2013-10-25 DIAGNOSIS — M542 Cervicalgia: Secondary | ICD-10-CM | POA: Diagnosis present

## 2013-10-25 DIAGNOSIS — C770 Secondary and unspecified malignant neoplasm of lymph nodes of head, face and neck: Secondary | ICD-10-CM | POA: Diagnosis present

## 2013-10-25 DIAGNOSIS — R5383 Other fatigue: Secondary | ICD-10-CM | POA: Diagnosis not present

## 2013-10-25 DIAGNOSIS — E876 Hypokalemia: Secondary | ICD-10-CM | POA: Diagnosis present

## 2013-10-25 DIAGNOSIS — D72829 Elevated white blood cell count, unspecified: Secondary | ICD-10-CM | POA: Diagnosis present

## 2013-10-25 DIAGNOSIS — Z681 Body mass index (BMI) 19 or less, adult: Secondary | ICD-10-CM

## 2013-10-25 DIAGNOSIS — D75839 Thrombocytosis, unspecified: Secondary | ICD-10-CM | POA: Diagnosis present

## 2013-10-25 DIAGNOSIS — K5909 Other constipation: Secondary | ICD-10-CM

## 2013-10-25 DIAGNOSIS — R634 Abnormal weight loss: Secondary | ICD-10-CM | POA: Diagnosis present

## 2013-10-25 DIAGNOSIS — Z720 Tobacco use: Secondary | ICD-10-CM

## 2013-10-25 DIAGNOSIS — D473 Essential (hemorrhagic) thrombocythemia: Secondary | ICD-10-CM | POA: Diagnosis present

## 2013-10-25 DIAGNOSIS — R4182 Altered mental status, unspecified: Secondary | ICD-10-CM | POA: Diagnosis present

## 2013-10-25 DIAGNOSIS — L988 Other specified disorders of the skin and subcutaneous tissue: Secondary | ICD-10-CM | POA: Diagnosis present

## 2013-10-25 DIAGNOSIS — W19XXXA Unspecified fall, initial encounter: Secondary | ICD-10-CM

## 2013-10-25 DIAGNOSIS — R531 Weakness: Secondary | ICD-10-CM

## 2013-10-25 DIAGNOSIS — D649 Anemia, unspecified: Secondary | ICD-10-CM | POA: Diagnosis present

## 2013-10-25 DIAGNOSIS — D630 Anemia in neoplastic disease: Secondary | ICD-10-CM

## 2013-10-25 LAB — CBC WITH DIFFERENTIAL/PLATELET
Basophils Absolute: 0 10*3/uL (ref 0.0–0.1)
Basophils Relative: 0 % (ref 0–1)
EOS ABS: 0.8 10*3/uL — AB (ref 0.0–0.7)
Eosinophils Relative: 3 % (ref 0–5)
HCT: 43 % (ref 39.0–52.0)
Hemoglobin: 14.2 g/dL (ref 13.0–17.0)
Lymphocytes Relative: 9 % — ABNORMAL LOW (ref 12–46)
Lymphs Abs: 2.3 10*3/uL (ref 0.7–4.0)
MCH: 32.4 pg (ref 26.0–34.0)
MCHC: 33 g/dL (ref 30.0–36.0)
MCV: 98.2 fL (ref 78.0–100.0)
MONO ABS: 2.3 10*3/uL — AB (ref 0.1–1.0)
Monocytes Relative: 9 % (ref 3–12)
NEUTROS PCT: 79 % — AB (ref 43–77)
Neutro Abs: 20.1 10*3/uL — ABNORMAL HIGH (ref 1.7–7.7)
PLATELETS: 680 10*3/uL — AB (ref 150–400)
RBC: 4.38 MIL/uL (ref 4.22–5.81)
RDW: 14.1 % (ref 11.5–15.5)
WBC: 25.5 10*3/uL — ABNORMAL HIGH (ref 4.0–10.5)

## 2013-10-25 LAB — URINALYSIS, ROUTINE W REFLEX MICROSCOPIC
Bilirubin Urine: NEGATIVE
Glucose, UA: NEGATIVE mg/dL
KETONES UR: NEGATIVE mg/dL
LEUKOCYTES UA: NEGATIVE
NITRITE: NEGATIVE
PH: 6 (ref 5.0–8.0)
PROTEIN: NEGATIVE mg/dL
Specific Gravity, Urine: 1.01 (ref 1.005–1.030)
Urobilinogen, UA: 0.2 mg/dL (ref 0.0–1.0)

## 2013-10-25 LAB — COMPREHENSIVE METABOLIC PANEL
ALT: 10 U/L (ref 0–53)
AST: 13 U/L (ref 0–37)
Albumin: 3.5 g/dL (ref 3.5–5.2)
Alkaline Phosphatase: 74 U/L (ref 39–117)
BUN: 16 mg/dL (ref 6–23)
CO2: 34 mEq/L — ABNORMAL HIGH (ref 19–32)
CREATININE: 0.8 mg/dL (ref 0.50–1.35)
Chloride: 90 mEq/L — ABNORMAL LOW (ref 96–112)
GFR calc non Af Amer: >90 mL/min (ref >90)
Glucose, Bld: 125 mg/dL — ABNORMAL HIGH (ref 70–99)
Potassium: 3.1 mEq/L — ABNORMAL LOW (ref 3.7–5.3)
SODIUM: 138 meq/L (ref 137–147)
Total Bilirubin: 0.4 mg/dL (ref 0.3–1.2)
Total Protein: 7.8 g/dL (ref 6.0–8.3)

## 2013-10-25 LAB — URINE MICROSCOPIC-ADD ON

## 2013-10-25 LAB — BASIC METABOLIC PANEL
BUN: 13 mg/dL (ref 6–23)
CO2: 31 mEq/L (ref 19–32)
CREATININE: 0.7 mg/dL (ref 0.50–1.35)
Calcium: 13.4 mg/dL (ref 8.4–10.5)
Chloride: 97 mEq/L (ref 96–112)
GFR calc Af Amer: >90 mL/min (ref >90)
GLUCOSE: 113 mg/dL — AB (ref 70–99)
POTASSIUM: 3.1 meq/L — AB (ref 3.7–5.3)
Sodium: 138 mEq/L (ref 137–147)

## 2013-10-25 LAB — I-STAT CG4 LACTIC ACID, ED: Lactic Acid, Venous: 1.16 mmol/L (ref 0.5–2.2)

## 2013-10-25 LAB — I-STAT TROPONIN, ED: TROPONIN I, POC: 0 ng/mL (ref 0.00–0.08)

## 2013-10-25 MED ORDER — DEXAMETHASONE SODIUM PHOSPHATE 4 MG/ML IJ SOLN
8.0000 mg | Freq: Three times a day (TID) | INTRAMUSCULAR | Status: DC
  Administered 2013-10-25 – 2013-10-27 (×6): 8 mg via INTRAVENOUS
  Filled 2013-10-25 (×8): qty 2

## 2013-10-25 MED ORDER — SODIUM CHLORIDE 0.9 % IV SOLN: INTRAVENOUS | Status: DC

## 2013-10-25 MED ORDER — PIPERACILLIN-TAZOBACTAM 3.375 G IVPB
3.3750 g | Freq: Three times a day (TID) | INTRAVENOUS | Status: DC
  Administered 2013-10-25 – 2013-11-01 (×20): 3.375 g via INTRAVENOUS
  Filled 2013-10-25 (×23): qty 50

## 2013-10-25 MED ORDER — SODIUM CHLORIDE 0.9 % IV SOLN
90.0000 mg | Freq: Once | INTRAVENOUS | Status: AC
  Administered 2013-10-26: 90 mg via INTRAVENOUS
  Filled 2013-10-25: qty 10

## 2013-10-25 MED ORDER — ENOXAPARIN SODIUM 30 MG/0.3ML ~~LOC~~ SOLN
30.0000 mg | SUBCUTANEOUS | Status: DC
  Administered 2013-10-25 – 2013-10-31 (×7): 30 mg via SUBCUTANEOUS
  Filled 2013-10-25 (×8): qty 0.3

## 2013-10-25 MED ORDER — MORPHINE SULFATE 15 MG PO TABS
15.0000 mg | ORAL_TABLET | ORAL | Status: DC | PRN
  Administered 2013-10-25 – 2013-10-30 (×14): 15 mg via ORAL
  Filled 2013-10-25 (×14): qty 1

## 2013-10-25 MED ORDER — POTASSIUM CHLORIDE CRYS ER 20 MEQ PO TBCR
40.0000 meq | EXTENDED_RELEASE_TABLET | Freq: Once | ORAL | Status: AC
  Administered 2013-10-25: 40 meq via ORAL
  Filled 2013-10-25 (×2): qty 2

## 2013-10-25 MED ORDER — FUROSEMIDE 10 MG/ML IJ SOLN
40.0000 mg | Freq: Once | INTRAMUSCULAR | Status: AC
  Administered 2013-10-25: 40 mg via INTRAVENOUS
  Filled 2013-10-25: qty 4

## 2013-10-25 MED ORDER — MORPHINE SULFATE 4 MG/ML IJ SOLN
4.0000 mg | Freq: Once | INTRAMUSCULAR | Status: AC
  Administered 2013-10-25: 4 mg via INTRAVENOUS
  Filled 2013-10-25: qty 1

## 2013-10-25 MED ORDER — ENOXAPARIN SODIUM 40 MG/0.4ML ~~LOC~~ SOLN
40.0000 mg | SUBCUTANEOUS | Status: DC
Start: 2013-10-25 — End: 2013-10-25
  Filled 2013-10-25: qty 0.4

## 2013-10-25 MED ORDER — ONDANSETRON HCL 4 MG/2ML IJ SOLN
4.0000 mg | Freq: Four times a day (QID) | INTRAMUSCULAR | Status: DC | PRN
  Administered 2013-10-29: 4 mg via INTRAVENOUS
  Filled 2013-10-25: qty 2

## 2013-10-25 MED ORDER — SODIUM CHLORIDE 0.9 % IV SOLN
INTRAVENOUS | Status: DC
  Administered 2013-10-25 (×2): via INTRAVENOUS
  Administered 2013-10-26: 1000 mL via INTRAVENOUS
  Administered 2013-10-26 (×2): via INTRAVENOUS
  Administered 2013-10-27 – 2013-10-28 (×3): 1000 mL via INTRAVENOUS

## 2013-10-25 MED ORDER — POTASSIUM CHLORIDE CRYS ER 20 MEQ PO TBCR
40.0000 meq | EXTENDED_RELEASE_TABLET | Freq: Once | ORAL | Status: AC
  Administered 2013-10-25: 40 meq via ORAL
  Filled 2013-10-25: qty 2

## 2013-10-25 MED ORDER — HYDROMORPHONE HCL PF 1 MG/ML IJ SOLN: 0.5000 mg | INTRAMUSCULAR | Status: DC | PRN

## 2013-10-25 MED ORDER — BIOTENE DRY MOUTH MT LIQD
15.0000 mL | Freq: Two times a day (BID) | OROMUCOSAL | Status: DC
  Administered 2013-10-25 – 2013-10-31 (×10): 15 mL via OROMUCOSAL

## 2013-10-25 MED ORDER — MORPHINE SULFATE 2 MG/ML IJ SOLN
1.0000 mg | INTRAMUSCULAR | Status: DC | PRN
  Administered 2013-10-25 – 2013-10-30 (×19): 2 mg via INTRAVENOUS
  Filled 2013-10-25 (×19): qty 1

## 2013-10-25 MED ORDER — POLYETHYLENE GLYCOL 3350 17 G PO PACK
17.0000 g | PACK | Freq: Every day | ORAL | Status: DC
  Administered 2013-10-25 – 2013-11-01 (×5): 17 g via ORAL
  Filled 2013-10-25 (×8): qty 1

## 2013-10-25 MED ORDER — SODIUM CHLORIDE 0.9 % IV BOLUS (SEPSIS)
2000.0000 mL | Freq: Once | INTRAVENOUS | Status: AC
  Administered 2013-10-25: 2000 mL via INTRAVENOUS

## 2013-10-25 MED ORDER — ONDANSETRON HCL 4 MG PO TABS: 4.0000 mg | ORAL_TABLET | Freq: Four times a day (QID) | ORAL | Status: DC | PRN

## 2013-10-25 MED ORDER — ACETAMINOPHEN 325 MG PO TABS
650.0000 mg | ORAL_TABLET | Freq: Four times a day (QID) | ORAL | Status: DC | PRN
  Administered 2013-10-30: 650 mg via ORAL
  Filled 2013-10-25: qty 2

## 2013-10-25 MED ORDER — PROMETHAZINE HCL 25 MG/ML IJ SOLN: 12.5000 mg | Freq: Four times a day (QID) | INTRAMUSCULAR | Status: DC | PRN

## 2013-10-25 MED ORDER — CHLORHEXIDINE GLUCONATE 0.12 % MT SOLN
15.0000 mL | Freq: Two times a day (BID) | OROMUCOSAL | Status: DC
  Administered 2013-10-25 – 2013-10-31 (×11): 15 mL via OROMUCOSAL
  Filled 2013-10-25 (×15): qty 15

## 2013-10-25 MED ORDER — SENNOSIDES-DOCUSATE SODIUM 8.6-50 MG PO TABS
2.0000 | ORAL_TABLET | Freq: Two times a day (BID) | ORAL | Status: DC
  Administered 2013-10-25 – 2013-11-01 (×10): 2 via ORAL
  Filled 2013-10-25 (×15): qty 2

## 2013-10-25 MED ORDER — MAGNESIUM HYDROXIDE 400 MG/5ML PO SUSP
30.0000 mL | Freq: Every day | ORAL | Status: DC | PRN
  Administered 2013-10-27: 30 mL via ORAL
  Filled 2013-10-25: qty 30

## 2013-10-25 NOTE — Progress Notes (Signed)
Stayed with patient's spouse while patient was in ED. She has Rheumatoid Arthritis and other health issues. In addition, she has some current grief related struggles due to her sister's loss of her great-granddaughter's infant (the funeral was Saturday). The caregiving is difficult because of her exhaustion and conflict with the patient. He has had some episodes of behavior at home, like wanting to take off walking "in only his pajama bottoms with no shoes," that have also contributed to her stress.  Empathetic listening; compassionate presence; grief support.

## 2013-10-25 NOTE — H&P (Signed)
[facility] History and Physical  Angel [name] KZS:[id number] DOB: 1950/05/24 DOA: 10/25/2013  Referring physician: EDP PCP: [name], MD   Chief Complaint: generalized weakness and fall yesterday.   HPI: Angel [name] is a 63 y.o. male with recently diagnosed with squamous cell carcinoma of the tongue, follows with Dr Alvy Bimler presented to ED with his wife, with complaints of generalized weakness, mechanical fall yesterday, . He reports he does not have an appetite, lost more than 20 pounds from the month of may. He reports constipation . He also reports pain in the neck mass and his pain meds at home and not helping him much. On arrival to ED, he was found to have a calcium level of 15. He was referred to medical service for admission. Chemotherapy was not started yet. Dr Alvy Bimler i s  Aware of patient's admission   Review of Systems:  Positive for   Past Medical History  Diagnosis Date  . Constipation due to pain medication   . Malignant neoplasm of base of tongue     left neck lymph and Left BOT cancer  . Current smoker   . 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 Testicle surgery      as a infant  . Appendectomy    . Mandible surgery      from [place]  . Back surgery      Lumbar  . Tonsillectomy    . Panendoscopy N/A 10/04/2013    Procedure: Direct Laryngoscopy  WITH BIOPSY;  Surgeon: Melida Quitter, MD;  Location: [place];  Service: ENT;  Laterality: N/A;  . Esophagoscopy  10/04/2013    Procedure: ESOPHAGOSCOPY;  Surgeon: Melida Quitter, MD;  Location: [place];  Service: ENT;;  . Multiple extractions with alveoloplasty N/A 10/08/2013    Procedure: extraction of tooth #'s 2,3,4,5,6,11,12,13,14,15,18,19,20,21,22,23,24,25,26, 27,28, 29, 31 with alveoloplasty ;  Surgeon: Lenn Cal, DDS;  Location: [place];  Service: Oral Surgery;  Laterality: N/A;   Social  History:  reports that he has been smoking Cigarettes.  He has a 90 pack-year smoking history. He has never used smokeless tobacco. He reports that he uses illicit drugs (Marijuana). He reports that he does not drink alcohol.  No Known Allergies  Family History  Problem Relation Age of Onset  . Cancer Mother     Lung  . Cancer Father     ? Lung     Prior to Admission medications   Medication Sig Start Date End Date Taking? Authorizing Provider  acetaminophen (TYLENOL) 325 MG tablet Take 650 mg by mouth every 6 (six) hours as needed for mild pain or moderate pain.   Yes Historical Provider, MD  chlorhexidine (PERIDEX) 0.12 % solution Rinse with 15 mls twice daily for 30 seconds. Use after breakfast and at bedtime. Spit out excess. Do not swallow. 10/18/13  Yes Lenn Cal, DDS  dexamethasone (DECADRON) 4 MG tablet Start the day before Taxotere. Take once the day after, then 2 times a day x 2 days. Repeat in 3 weeks 10/15/13  Yes [name], MD  morphine (MSIR) 15 MG tablet Take 1 tablet (15 mg total) by mouth every 4 (four) hours as needed for severe pain. 10/14/13  Yes [name], MD  ondansetron (ZOFRAN) 8 MG tablet Take 1 tablet (8 mg total) by mouth every 8 (eight) hours as  needed for nausea or vomiting (Nausea or vomiting). 10/15/13  Yes [name], MD  prochlorperazine (COMPAZINE) 10 MG tablet Take 1 tablet (10 mg total) by mouth every 6 (six) hours as needed (Nausea or vomiting). 10/15/13  Yes [name], MD   Physical Exam: Filed Vitals:   10/25/13 1307  BP: 145/79  Pulse: 82  Temp: 98.3 F (36.8 C)  Resp: 20    BP 145/79  Pulse 82  Temp(Src) 98.3 F (36.8 C) (Oral)  Resp 20  Ht 5' 5.5" (1.664 m)  Wt 44.7 kg (98 lb 8.7 oz)  BMI 16.14 kg/m2  SpO2 99%  General:  Appears calm and comfortable Eyes: PERRL, normal lids, irises & conjunctiva Neck: left neck mass with drainage coming.  Cardiovascular: RRR, no m/r/g. No LE edema. Respiratory: CTA bilaterally, no w/r/r.  Normal respiratory effort. Abdomen: soft, ntnd Musculoskeletal: grossly normal tone BUE/BLE Neurologic: able to move  All extremities. No facial droop speech normal.           Labs on Admission:  Basic Metabolic Panel:  Recent Labs Lab 10/25/13 0908  NA 138  K 3.1*  CL 90*  CO2 34*  GLUCOSE 125*  BUN 16  CREATININE 0.80  CALCIUM >15.0*   Liver Function Tests:  Recent Labs Lab 10/25/13 0908  AST 13  ALT 10  ALKPHOS 74  BILITOT 0.4  PROT 7.8  ALBUMIN 3.5   No results found for this basename: LIPASE, AMYLASE,  in the last 168 hours No results found for this basename: AMMONIA,  in the last 168 hours CBC:  Recent Labs Lab 10/25/13 0908  WBC 25.5*  NEUTROABS 20.1*  HGB 14.2  HCT 43.0  MCV 98.2  PLT 680*   Cardiac Enzymes: No results found for this basename: CKTOTAL, CKMB, CKMBINDEX, TROPONINI,  in the last 168 hours  BNP (last 3 results) No results found for this basename: PROBNP,  in the last 8760 hours CBG: No results found for this basename: GLUCAP,  in the last 168 hours  Radiological Exams on Admission: No results found.    Assessment/Plan Active Problems:   Hypercalcemia of malignancy   Hypercalcemia malignant hypercalcemia.  Malignant hypercalcemia secondary to squamous cell carcinoma of the tongue with LAD involve ment.  - admit to telemetry. Started her on NS fluids at 154ml/hr. Repeat calcium level in 6 hours shows improvement from 15 to 13. He appears dehydrated , hence we will give him fluids till tonight and start lasix later on. IV pamidronate and IV decadron will be ordered by the oncologist Dr Alvy Bimler. When calcium levels improve, will d/c telemetry and transfer him to oncology unit. EKG shows sinus rhythm with prolong qt interval.  Currently he denies any chest pain . He reports palpitations and neck pain from the tumour.   Squamous cell cancer of the tongue with lymph node mets: - further management as per DR Alvy Bimler.  - will get  PICC LINE in am.   Tobacco abuse: - refused nicotine patch.   Constipation: - probably from the being on MS contin and hypercalcemia.  - stool softners and miralax, MOM ordered.   Hypokalemia: - replete as needed. Will get magnesium levels.   Leukocytosis: He is afebrile, could be from the cancer vs infection. Will empirically start him on zosyn. Continue to monitor.   DVT prophylaxis.      Code Status: full code  Family Communication: discussed with wife at bedside Disposition Plan: admit to telemetry.   Time spent: 75 minutes.  [facility] Pager [phone]  **Disclaimer: This note may have been dictated with voice recognition software. Similar sounding words can inadvertently be transcribed and this note may contain transcription errors which may not have been corrected upon publication of note.**

## 2013-10-25 NOTE — Progress Notes (Signed)
Responded to patient wife's 7:12 VM at ca. 7:20.  She reported her husband has been disoriented this past weekend, e.g turned wrong way leaving bedroom to go to the bathroom;  fell between small table and dresser in bedroom, no indication he hit head; oral intake reduced, including fluids; swelling L side of neck has visibly increased with some bleeding.  Per my contact with Dr. Alvy Bimler, I directed wife to bring her husband to [facility] ER. I met them in ER Waiting, provided support during the short time prior to being taken to Rm 18.  Wife expressed concern about patient's well being, tearful at times.  I notified Epifania Gore, [facility], who was able to come to room and provide additional support.  Will continue to monitor during this event.  Continuing navigation as L1 patient (new patient).  Gayleen Orem, RN, BSN, [facility] Head & Neck Oncology Navigator [phone]

## 2013-10-25 NOTE — Consult Note (Signed)
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 male is here because of severe dehydration, dizziness and pain. This patient is well-known to me. Summary of his oncologic history as follows: Oncology History   Cancer of base of tongue   Primary site: Pharynx - Oropharynx   Staging method: AJCC 7th Edition   Clinical: Stage IVB (T2, N3, M0) signed by [name], MD on 10/15/2013 10:25 AM   Summary: Stage IVB (T2, N3, M0)       Cancer of base of tongue   09/17/2013 Imaging CT scan of the neck showed large bilateral neck lymphadenopathy with associated tongue mass.   09/23/2013 Procedure Fine needle aspirate of the left neck mass came back positive for squamous cell carcinoma.   10/04/2013 Surgery Laryngoscopy showed a firm mass within the left tongue base extending past the midline and encompassing much of the tongue base.  There is extension of the mass into the vallecula and on to the lingual surface of the epiglottis.   10/04/2013 Pathology Vallecula and tongue biopsy confirmed squamous cell carcinoma.   10/15/2013 Imaging PET/CT scan showed no evidence of distant metastatic disease apart from just regional lymph node involvement.   The origin of plan is to have PICC line placed tomorrow and start systemic chemotherapy as an outpatient. Unfortunately, the patient was admitted to the hospital after presentation to the emergency Department with signs of dehydration and was found to have malignant hypercalcemia. He continued to have persistent drainage from the mass on his neck wound. He denies difficulty swallowing. Denies nausea or vomiting. He has reduced oral  intake with anorexia. He has some mild constipation. Denies confusion or seizures. He has generalized weakness.   MEDICAL HISTORY:  Past Medical History  Diagnosis Date  . Constipation due to pain medication   . Malignant neoplasm of base of tongue     left neck lymph and Left BOT cancer  . Current smoker   . 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 Testicle surgery      as a infant  . Appendectomy    . Mandible surgery      from [place]  . Back surgery      Lumbar  . Tonsillectomy    . Panendoscopy N/A 10/04/2013    Procedure: Direct Laryngoscopy  WITH BIOPSY;  Surgeon: Melida Quitter, MD;  Location: [facility];  Service: ENT;  Laterality: N/A;  . Esophagoscopy  10/04/2013    Procedure: ESOPHAGOSCOPY;  Surgeon: Melida Quitter, MD;  Location: [facility];  Service: ENT;;  . Multiple extractions with alveoloplasty N/A 10/08/2013    Procedure: extraction of tooth #'s 2,3,4,5,6,11,12,13,14,15,18,19,20,21,22,23,24,25,26, 27,28, 29, 31 with alveoloplasty ;  Surgeon: Lenn Cal, DDS;  Location: [facility];  Service: Oral Surgery;  Laterality: N/A;    SOCIAL HISTORY: History   Social History  . Marital Status: Married    Spouse Name: N/A    Number of Children: 1  . Years of Education: N/A   Occupational History  .     Social History Main  Topics  . Smoking status: Heavy Tobacco Smoker -- 2.00 packs/day for 45 years    Types: Cigarettes  . Smokeless tobacco: Never Used  . Alcohol Use: No     Comment: "Heavy" Alcohol Use in the Past  . Drug Use: Yes    Special: Marijuana  . Sexual Activity: Not on file   Other Topics Concern  . Not on file   Social History Narrative  . No narrative on file    FAMILY HISTORY: Family History  Problem Relation Age of Onset  . Cancer Mother     Lung  . Cancer Father     ? Lung    ALLERGIES:  has No Known  Allergies.  MEDICATIONS:  Current Facility-Administered Medications  Medication Dose Route Frequency Provider Last Rate Last Dose  . 0.9 %  sodium chloride infusion   Intravenous Continuous Hosie Poisson, MD 150 mL/hr at 10/25/13 1530    . acetaminophen (TYLENOL) tablet 650 mg  650 mg Oral Q6H PRN Hosie Poisson, MD      . antiseptic oral rinse (BIOTENE) solution 15 mL  15 mL Mouth Rinse BID Hosie Poisson, MD      . chlorhexidine (PERIDEX) 0.12 % solution 15 mL  15 mL Mouth/Throat BID Hosie Poisson, MD      . dexamethasone (DECADRON) injection 8 mg  8 mg Intravenous TID [name], MD      . magnesium hydroxide (MILK OF MAGNESIA) suspension 30 mL  30 mL Oral Daily PRN Hosie Poisson, MD      . morphine (MSIR) tablet 15 mg  15 mg Oral Q4H PRN Hosie Poisson, MD      . morphine 2 MG/ML injection 1-2 mg  1-2 mg Intravenous Q4H PRN Hosie Poisson, MD   2 mg at 10/25/13 1524  . ondansetron (ZOFRAN) tablet 4 mg  4 mg Oral Q6H PRN Hosie Poisson, MD       Or  . ondansetron (ZOFRAN) injection 4 mg  4 mg Intravenous Q6H PRN Hosie Poisson, MD      . polyethylene glycol (MIRALAX / GLYCOLAX) packet 17 g  17 g Oral Daily Hosie Poisson, MD   17 g at 10/25/13 1600  . promethazine (PHENERGAN) injection 12.5 mg  12.5 mg Intravenous Q6H PRN Hosie Poisson, MD      . senna-docusate (Senokot-S) tablet 2 tablet  2 tablet Oral BID Hosie Poisson, MD        REVIEW OF SYSTEMS:   Constitutional: Denies fevers, chills or abnormal night sweats Eyes: Denies blurriness of vision, double vision or watery eyes Ears, nose, mouth, throat, and face: Denies mucositis or sore throat Respiratory: Denies cough, dyspnea or wheezes Cardiovascular: Denies palpitation, chest discomfort or lower extremity swelling Gastrointestinal:  Denies nausea, heartburn or change in bowel habits Skin: Denies abnormal skin rashes Behavioral/Psych: Mood is stable, no new changes  All other systems were reviewed with the patient and are negative.  PHYSICAL  EXAMINATION: ECOG PERFORMANCE STATUS: 2 - Symptomatic, <50% confined to bed  Filed Vitals:   10/25/13 1307  BP: 145/79  Pulse: 82  Temp: 98.3 F (36.8 C)  Resp: 20   Filed Weights   10/25/13 1256  Weight: 98 lb 8.7 oz (44.7 kg)    GENERAL:alert, no distress and comfortable SKIN: skin color, texture, turgor are normal, no rashes or significant lesions. Noted discoloration around his neck wound.  EYES: normal, conjunctiva are pink and non-injected, sclera clear OROPHARYNX:no exudate, no erythema and lips, buccal mucosa, and tongue normal .  Poor dentition is noted  NECK:  a large left-sided neck mass with wound draining. LYMPH:  Bilateral cervical lymphadenopathy. No lymphadenopathy elsewhere.  LUNGS: clear to auscultation and percussion with normal breathing effort HEART: regular rate & rhythm and no murmurs and no lower extremity edema ABDOMEN:abdomen soft, non-tender and normal bowel sounds Musculoskeletal:no cyanosis of digits and no clubbing  PSYCH: alert & oriented x 3 with fluent speech NEURO: no focal motor/sensory deficits  LABORATORY DATA:  I have reviewed the data as listed Lab Results  Component Value Date   WBC 25.5* 10/25/2013   HGB 14.2 10/25/2013   HCT 43.0 10/25/2013   MCV 98.2 10/25/2013   PLT 680* 10/25/2013    Recent Labs  09/17/13 1158 09/29/13 1318 10/05/13 1602 10/25/13 0908  NA 137 134* 142 138  K 4.2 5.1 3.9 3.1*  CL 101 98  --  90*  CO2 25 27 30* 34*  GLUCOSE 121* 111* 104 125*  BUN 14 12 14.1 16  CREATININE 0.66 0.64 0.8 0.80  CALCIUM 9.2 9.8 9.5 >15.0*  GFRNONAA >90 >90  --  >90  GFRAA >90 >90  --  >90  PROT  --  7.3 6.4 7.8  ALBUMIN  --  3.4* 2.9* 3.5  AST  --  11 8 13   ALT  --  6 7 10   ALKPHOS  --  70 47 74  BILITOT  --  <0.2* 0.21 0.4    RADIOGRAPHIC STUDIES: I have personally reviewed the radiological images as listed and agreed with the findings in the report. Nm Pet Image Initial (pi) Skull Base To Thigh  10/15/2013    CLINICAL DATA:  Initial treatment strategy for tongue cancer.  EXAM: NUCLEAR MEDICINE PET SKULL BASE TO THIGH  TECHNIQUE: 6.4 mCi F-18 FDG was injected intravenously. Full-ring PET imaging was performed from the skull base to thigh after the radiotracer. CT data was obtained and used for attenuation correction and anatomic localization.  FASTING BLOOD GLUCOSE:  Value: 147 mg/dl  COMPARISON:  None.  FINDINGS: NECK  Large base of tongue mass measures 4.7 cm and has an SUV max equal 14.6. Large, necrotic lymph node within the left side of neck measures 8.1 x 7.8 cm and has an SUV max equal to 14.5. Hypermetabolic right level 2 lymph node measures 2.3 cm and has an SUV max equal to 10. There is a small right-sided level 4 lymph node which measures 8 mm and has an SUV max equal to 2.8.  CHEST  No hypermetabolic mediastinal or hilar nodes. No suspicious pulmonary nodules on the CT scan. Clustered nodules within the posterior right upper lobe are identified, image 23/series 7. There is associated cylindrical type bronchiectasis. Mild changes of centrilobular emphysema noted. The heart size is normal. No pericardial effusion. There is mild calcified atherosclerotic disease involving the thoracic aorta. Calcification involving the LAD coronary artery noted.  ABDOMEN/PELVIS  No abnormal hypermetabolic activity within the liver, pancreas, adrenal glands, or spleen. No hypermetabolic lymph nodes in the abdomen or pelvis.  SKELETON  No focal hypermetabolic activity to suggest skeletal metastasis.  IMPRESSION: 1. Examination is positive for large, hypermetabolic base of tongue mass. 2. Bilateral cervical lymph node metastasis. 3. No evidence for hypermetabolic metastasis to the chest abdomen or pelvis. 4. Postinflammatory/infectious changes within the posterior right upper lobe. 5. Atherosclerotic disease including multi vessel coronary artery calcifications.   Electronically Signed   By: Kerby Moors M.D.   On: 10/15/2013  09:53    ASSESSMENT &  PLAN:  Malignant hypercalcemia This is a medical emergency. This is due to untreated squamous cell carcinoma of the base of tongue. I recommend IV Decadron, IV pamidronate, IV fluids and Lasix. I recommend the patient to be kept on a telemetry bed until his calcium levels start to improve, then we'll discontinue telemetry and move him to 3 W. The patient will need inpatient chemotherapy.  Cancer of base of tongue with regional lymph node metastasis PET CT scan showed no evidence of distant metastatic disease, he will be a good candidate for induction chemotherapy followed by radiation treatment.  We discussed the role of chemotherapy. The intent is for cure.  I told the primary service to get IV team to Place PICC line. We will start him on chemotherapy hopefully by 10/27/2013.  Tobacco abuse  I spent some time counseling the patient the importance of tobacco cessation.  He will be started on nicotine patch.  Leukocytosis Likely due to infected necrotic tumor. Recommend IV antibiotics.  Thrombocytosis Likely due to infected necrotic tumor. Recommend IV antibiotics.  Neck pain  This is due to cancer.  Continue pain medicine as needed.  DVT prophylaxis He needs to be started on Lovenox  Chronic constipation This could be worse with malignant hypercalcemia and dehydration. Recommend regular laxatives.   CODE STATUS Full code All questions were answered. The patient knows to call the clinic with any problems, questions or concerns.    [facility], [place], MD 10/25/2013 4:07 PM

## 2013-10-25 NOTE — Progress Notes (Signed)
CRITICAL VALUE ALERT  Critical value received:  Ca 13.4  Date of notification:  10/25/13  Time of notification:  1610  Critical value read back:Yes.    Nurse who received alert:  b roth  MD notified (1st page):  Karleen Hampshire  Time of first page:  [phone]  MD notified (2nd page):  Time of second page:  Responding MD:  Karleen Hampshire  Time MD responded:  [id number]

## 2013-10-25 NOTE — Progress Notes (Signed)
[facility] provided pt with a list of primary care resources to help patient establish a PCP.

## 2013-10-25 NOTE — Progress Notes (Signed)
ANTIBIOTIC CONSULT NOTE - INITIAL  Pharmacy Consult for Zosyn Indication: Empiric treatment (leukocytosis in cancer patient)  No Known Allergies  Patient Measurements: Height: 5' 5.5" (166.4 cm) Weight: 98 lb 8.7 oz (44.7 kg) IBW/kg (Calculated) : 62.65  Vital Signs: Temp: 98.3 F (36.8 C) (06/29 1307) Temp src: Oral (06/29 1307) BP: 145/79 mmHg (06/29 1307) Pulse Rate: 82 (06/29 1307) Intake/Output from previous day:   Intake/Output from this shift: Total I/O In: -  Out: 500 [Urine:500]  Labs:  Recent Labs  10/25/13 0908 10/25/13 1529  WBC 25.5*  --   HGB 14.2  --   PLT 680*  --   CREATININE 0.80 0.70   Estimated Creatinine Clearance: 61.3 ml/min (by C-G formula based on Cr of 0.7). No results found for this basename: VANCOTROUGH, VANCOPEAK, VANCORANDOM, GENTTROUGH, GENTPEAK, GENTRANDOM, TOBRATROUGH, TOBRAPEAK, TOBRARND, AMIKACINPEAK, AMIKACINTROU, AMIKACIN,  in the last 72 hours   Microbiology: No results found for this or any previous visit (from the past 720 hour(s)).  Medical History: Past Medical History  Diagnosis Date  . Constipation due to pain medication   . Malignant neoplasm of base of tongue     left neck lymph and Left BOT cancer  . Current smoker   . 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 antiseptic oral rinse  15 mL Mouth Rinse BID  . chlorhexidine  15 mL Mouth/Throat BID  . dexamethasone  8 mg Intravenous TID  . enoxaparin (LOVENOX) injection  30 mg Subcutaneous Q24H  . furosemide  40 mg Intravenous Once  . [START ON 10/26/2013] pamidronate  90 mg Intravenous Once  . piperacillin-tazobactam (ZOSYN)  IV  3.375 g Intravenous Q8H  . polyethylene glycol  17 g Oral Daily  . potassium chloride  40 mEq Oral Once  . senna-docusate  2 tablet Oral BID   Infusions:  . sodium chloride 150 mL/hr at 10/25/13 1530   Assessment:  63 yr male with recent  diagnosis of squamous cell CA of tongue with complaint of weakness  Elevated WBC (25.5) and afebrile  Zosyn per pharmacy ordered for empiric treatment of leukocytosis   Goal of Therapy:  Eradication of infection  Plan:  Follow up culture results Zosyn 3.375gm IV q8h (each dose infused over 4 hrs)  [place], Toribio Harbour, PharmD 10/25/2013,6:09 PM

## 2013-10-25 NOTE — Progress Notes (Signed)
UR completed 

## 2013-10-25 NOTE — Progress Notes (Signed)
Met with patient and his wife in [place] to provide further support.  Patient reported pain level of 8, I re-educated patient on use of call bell.  I encouraged him to use call bell to notify RN when his pain level reaches 4-5.  Upon departure, I saw NT and requested that patient be given no-skid socks.  She verbalized understanding.  Spoke with RN Raye Sorrow on my departure, reiterated the above re: patient's need for pain med and safety socks.  She verbalized understanding.  Upon return to [facility], updated Dr. Alvy Bimler, she said she was going to follow-up with IP MD.  Continuing navigation as L1 patient (new patient).  Gayleen Orem, RN, BSN, [facility] Oncology Navigator [phone]

## 2013-10-25 NOTE — ED Provider Notes (Signed)
CSN: [id number]     Arrival date & time 10/25/13  0831 History   First MD Initiated Contact with Patient 10/25/13 [phone]     Chief Complaint  Patient presents with  . Weakness     (Consider location/radiation/quality/duration/timing/severity/associated sxs/prior Treatment) HPI 63 year old male with recently diagnosed left-sided neck mass cancer with cancer of the base of his tongue has not started chemotherapy or radiation therapy yet, has a 2-3 month history of gradually enlarging painful left neck mass worse with palpation swallowing and talking pain was controlled with morphine but his wife. The patient's morphine a couple days ago since the patient seemed possibly confused with repeating questions couple of days ago, the patient is no longer confused but he does have recurrent severe localized left neck pain from his neck mass, he is lost over 20 pounds in the last couple of months and is here for generalized weakness with a fall yesterday without injury, he had no lightheadedness or vertigo just a generalized weakness causing his fall, he is no headache neck pain back pain chest pain abdominal pain shortness breath or other concerns just generalized weakness with decreased appetite with decreased oral intake for the last couple of months. His oncologist recommended he come to the emergency department. Past Medical History  Diagnosis Date  . Constipation due to pain medication   . Malignant neoplasm of base of tongue     left neck lymph and Left BOT cancer  . Current smoker   . 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 Testicle surgery      as a infant  . Appendectomy    . Mandible surgery      from [place]  . Back surgery      Lumbar  . Tonsillectomy    . Panendoscopy N/A 10/04/2013    Procedure: Direct Laryngoscopy  WITH BIOPSY;  Surgeon: Melida Quitter, MD;  Location: [place];  Service: ENT;  Laterality: N/A;  . Esophagoscopy  10/04/2013    Procedure: ESOPHAGOSCOPY;  Surgeon: Melida Quitter, MD;  Location: [place];  Service: ENT;;  . Multiple extractions with alveoloplasty N/A 10/08/2013    Procedure: extraction of tooth #'s 2,3,4,5,6,11,12,13,14,15,18,19,20,21,22,23,24,25,26, 27,28, 29, 31 with alveoloplasty ;  Surgeon: Lenn Cal, DDS;  Location: [place];  Service: Oral Surgery;  Laterality: N/A;   Family History  Problem Relation Age of Onset  . Cancer Mother     Lung  . Cancer Father     ? Lung   History  Substance Use Topics  . Smoking status: Heavy Tobacco Smoker -- 2.00 packs/day for 45 years    Types: Cigarettes  . Smokeless tobacco: Never Used  . Alcohol Use: No     Comment: "Heavy" Alcohol Use in the Past    Review of Systems  10 Systems reviewed and are negative for acute change except as noted in the HPI.  Allergies  Review of patient's allergies indicates no known allergies.  Home Medications   Prior to Admission medications   Medication Sig Start Date End Date Taking? Authorizing Provider  acetaminophen (TYLENOL) 325 MG tablet Take 650 mg by mouth every 6 (six) hours as needed for mild pain or moderate pain.   Yes Historical Provider, MD  chlorhexidine (PERIDEX) 0.12 % solution Rinse with 15 mls twice daily for 30 seconds. Use after breakfast and at bedtime. Spit  out excess. Do not swallow. 10/18/13  Yes Lenn Cal, DDS  dexamethasone (DECADRON) 4 MG tablet Start the day before Taxotere. Take once the day after, then 2 times a day x 2 days. Repeat in 3 weeks 10/15/13  Yes [name], MD  morphine (MSIR) 15 MG tablet Take 1 tablet (15 mg total) by mouth every 4 (four) hours as needed for severe pain. 10/14/13  Yes [name], MD  ondansetron (ZOFRAN) 8 MG tablet Take 1 tablet (8 mg total) by mouth every 8 (eight) hours as needed for nausea or vomiting (Nausea or vomiting). 10/15/13  Yes [name], MD  prochlorperazine (COMPAZINE) 10  MG tablet Take 1 tablet (10 mg total) by mouth every 6 (six) hours as needed (Nausea or vomiting). 10/15/13  Yes Ni Gorsuch, MD   BP 157/99  Pulse 87  Temp(Src) 97.8 F (36.6 C) (Oral)  Resp 16  Ht 5' 5.5" (1.664 m)  Wt 103 lb 9.9 oz (47 kg)  BMI 16.97 kg/m2  SpO2 99% Physical Exam  Nursing note and vitals reviewed. Constitutional: He is oriented to person, place, and time.  Awake, alert, nontoxic appearance.  HENT:  Head: Atraumatic.  No stridor no drooling  Eyes: Right eye exhibits no discharge. Left eye exhibits no discharge.  Neck: Neck supple.  Large left-sided tender neck mass with mild baseline erythema with 2 cm ulceration without purulent drainage or foul odor  Cardiovascular: Regular rhythm.   No murmur heard. Mildly tachycardic  Pulmonary/Chest: Effort normal and breath sounds normal. No respiratory distress. He has no wheezes. He has no rales. He exhibits no tenderness.  Pulse oximetry normal room air 97%  Abdominal: Soft. Bowel sounds are normal. He exhibits no distension and no mass. There is no tenderness. There is no rebound and no guarding.  Musculoskeletal: He exhibits no tenderness.  Baseline ROM, no obvious new focal weakness.  Neurological: He is alert and oriented to person, place, and time.  Mental status and motor strength appears baseline for patient and situation.  Skin: No rash noted.  Psychiatric: He has a normal mood and affect.    ED Course  Procedures (including critical care time) Patient / Family / Caregiver informed of clinical course, understand medical decision-making process, and agree with plan.Pt stable in ED with no significant deterioration in condition.d/w [facility] for admit.  CRITICAL CARE Performed by: Babette Relic Total critical care time: 22min including initiation of IVF for hypercalcemia. Critical care time was exclusive of separately billable procedures and treating other patients. Critical care was necessary to treat or prevent  imminent or life-threatening deterioration. Critical care was time spent personally by me on the following activities: development of treatment plan with patient and/or surrogate as well as nursing, discussions with consultants, evaluation of patient's response to treatment, examination of patient, obtaining history from patient or surrogate, ordering and performing treatments and interventions, ordering and review of laboratory studies, ordering and review of radiographic studies, pulse oximetry and re-evaluation of patient's condition. Labs Review Labs Reviewed  CBC WITH DIFFERENTIAL - Abnormal; Notable for the following:    WBC 25.5 (*)    Platelets 680 (*)    Neutrophils Relative % 79 (*)    Lymphocytes Relative 9 (*)    Neutro Abs 20.1 (*)    Monocytes Absolute 2.3 (*)    Eosinophils Absolute 0.8 (*)    All other components within normal limits  COMPREHENSIVE METABOLIC PANEL - Abnormal; Notable for the following:    Potassium 3.1 (*)  Chloride 90 (*)    CO2 34 (*)    Glucose, Bld 125 (*)    Calcium >15.0 (*)    All other components within normal limits  URINALYSIS, ROUTINE W REFLEX MICROSCOPIC - Abnormal; Notable for the following:    APPearance CLOUDY (*)    Hgb urine dipstick TRACE (*)    All other components within normal limits  URINE MICROSCOPIC-ADD ON - Abnormal; Notable for the following:    Casts GRANULAR CAST (*)    All other components within normal limits  BASIC METABOLIC PANEL - Abnormal; Notable for the following:    Potassium 3.1 (*)    Glucose, Bld 113 (*)    Calcium 13.4 (*)    All other components within normal limits  COMPREHENSIVE METABOLIC PANEL - Abnormal; Notable for the following:    Potassium 3.3 (*)    Glucose, Bld 138 (*)    Calcium 13.0 (*)    Albumin 2.8 (*)    All other components within normal limits  CBC - Abnormal; Notable for the following:    WBC 32.1 (*)    RBC 3.25 (*)    Hemoglobin 10.7 (*)    HCT 31.7 (*)    Platelets 543 (*)     All other components within normal limits  BASIC METABOLIC PANEL - Abnormal; Notable for the following:    Potassium 3.1 (*)    Glucose, Bld 136 (*)    Calcium 11.4 (*)    All other components within normal limits  MAGNESIUM  COMPREHENSIVE METABOLIC PANEL  CBC  MAGNESIUM  I-STAT CG4 LACTIC ACID, ED  I-STAT TROPOININ, ED    Imaging Review Ir Fluoro Guide Cv Line Right  10/26/2013   CLINICAL DATA:  History of squamous cell cancer of the oropharynx; central venous access is requested for chemotherapy.  EXAM: IR RIGHT FLOURO GUIDE CV LINE; IR ULTRASOUND GUIDANCE VASC ACCESS RIGHT  FLUOROSCOPY TIME:  24 seconds  TECHNIQUE: The right arm was prepped with chlorhexidine, draped in the usual sterile fashion using maximum barrier technique (cap and mask, sterile gown, sterile gloves, large sterile sheet, hand hygiene and cutaneous antiseptic). Local anesthesia was attained by infiltration with 1% lidocaine.  Ultrasound demonstrated patency of the right brachial vein, and this was documented with an image. Under real-time ultrasound guidance, this vein was accessed with a 21 gauge micropuncture needle and image documentation was performed. The needle was exchanged over a guidewire for a peel-away sheath through which a 39 cm 5 [place] double lumen power injectable PICC was advanced, and positioned with its tip at the lower SVC/right atrial junction. Fluoroscopy during the procedure and fluoro spot radiograph confirms appropriate catheter position. The catheter was flushed, secured to the skin with Prolene sutures, and covered with a sterile dressing.  COMPLICATIONS: None.  The patient tolerated the procedure well.  IMPRESSION: Successful placement of a right arm PICC with sonographic and fluoroscopic guidance. The catheter is ready for use.  Read by: Rowe Robert ,P.A.-C.   Electronically Signed   By: Aletta Edouard M.D.   On: 10/26/2013 14:14   Ir US Guide Vasc Access Right  10/26/2013   CLINICAL DATA:   History of squamous cell cancer of the oropharynx; central venous access is requested for chemotherapy.  EXAM: IR RIGHT FLOURO GUIDE CV LINE; IR ULTRASOUND GUIDANCE VASC ACCESS RIGHT  FLUOROSCOPY TIME:  24 seconds  TECHNIQUE: The right arm was prepped with chlorhexidine, draped in the usual sterile fashion using maximum barrier technique (cap and  mask, sterile gown, sterile gloves, large sterile sheet, hand hygiene and cutaneous antiseptic). Local anesthesia was attained by infiltration with 1% lidocaine.  Ultrasound demonstrated patency of the right brachial vein, and this was documented with an image. Under real-time ultrasound guidance, this vein was accessed with a 21 gauge micropuncture needle and image documentation was performed. The needle was exchanged over a guidewire for a peel-away sheath through which a 39 cm 5 [place] double lumen power injectable PICC was advanced, and positioned with its tip at the lower SVC/right atrial junction. Fluoroscopy during the procedure and fluoro spot radiograph confirms appropriate catheter position. The catheter was flushed, secured to the skin with Prolene sutures, and covered with a sterile dressing.  COMPLICATIONS: None.  The patient tolerated the procedure well.  IMPRESSION: Successful placement of a right arm PICC with sonographic and fluoroscopic guidance. The catheter is ready for use.  Read by: Rowe Robert ,P.A.-C.   Electronically Signed   By: Aletta Edouard M.D.   On: 10/26/2013 14:14     EKG Interpretation   Date/Time:  Monday October 25 2013 11:00:23 EDT Ventricular Rate:  89 PR Interval:  147 QRS Duration: 97 QT Interval:  491 QTC Calculation: 597 R Axis:   16 Text Interpretation:  Sinus rhythm RSR' in V1 or V2, probably normal  variant Nonspecific repol abnormality, diffuse leads Prolonged QT interval  No previous ECGs available Confirmed by [facility]  MD, Jenny Reichmann ([id number]) on  10/25/2013 11:31:43 AM      MDM   Final diagnoses:  Hypercalcemia of  malignancy  Cancer of base of tongue  Neck pain    The patient appears reasonably stabilized for admission considering the current resources, flow, and capabilities available in the ED at this time, and I doubt any other [facility] requiring further screening and/or treatment in the ED prior to admission.   Babette Relic, MD 10/27/13 2221

## 2013-10-25 NOTE — ED Notes (Signed)
Pt states increased weakness since Saturday.  Pt has extensive medical history.  No n/v.   No fever.  Pt also states constipation but has recently been on narcotics

## 2013-10-26 ENCOUNTER — Ambulatory Visit: Payer: Self-pay

## 2013-10-26 ENCOUNTER — Inpatient Hospital Stay (HOSPITAL_COMMUNITY)
Admission: RE | Admit: 2013-10-26 | Discharge: 2013-10-26 | Disposition: A | Discharge status: Home / Self Care | Payer: Medicaid Other | Source: Ambulatory Visit | Attending: Hematology and Oncology | Admitting: Hematology and Oncology

## 2013-10-26 DIAGNOSIS — D696 Thrombocytopenia, unspecified: Secondary | ICD-10-CM

## 2013-10-26 DIAGNOSIS — R1310 Dysphagia, unspecified: Secondary | ICD-10-CM

## 2013-10-26 DIAGNOSIS — K5900 Constipation, unspecified: Secondary | ICD-10-CM

## 2013-10-26 DIAGNOSIS — D72819 Decreased white blood cell count, unspecified: Secondary | ICD-10-CM

## 2013-10-26 DIAGNOSIS — E440 Moderate protein-calorie malnutrition: Secondary | ICD-10-CM | POA: Diagnosis present

## 2013-10-26 DIAGNOSIS — R5381 Other malaise: Secondary | ICD-10-CM

## 2013-10-26 DIAGNOSIS — C779 Secondary and unspecified malignant neoplasm of lymph node, unspecified: Secondary | ICD-10-CM

## 2013-10-26 DIAGNOSIS — R5383 Other fatigue: Secondary | ICD-10-CM

## 2013-10-26 DIAGNOSIS — M542 Cervicalgia: Secondary | ICD-10-CM

## 2013-10-26 DIAGNOSIS — E43 Unspecified severe protein-calorie malnutrition: Secondary | ICD-10-CM | POA: Diagnosis present

## 2013-10-26 LAB — COMPREHENSIVE METABOLIC PANEL
ALBUMIN: 2.8 g/dL — AB (ref 3.5–5.2)
ALT: 8 U/L (ref 0–53)
AST: 11 U/L (ref 0–37)
Alkaline Phosphatase: 63 U/L (ref 39–117)
BILIRUBIN TOTAL: 0.3 mg/dL (ref 0.3–1.2)
BUN: 15 mg/dL (ref 6–23)
CHLORIDE: 96 meq/L (ref 96–112)
CO2: 32 meq/L (ref 19–32)
CREATININE: 0.82 mg/dL (ref 0.50–1.35)
Calcium: 13 mg/dL — ABNORMAL HIGH (ref 8.4–10.5)
GFR calc Af Amer: >90 mL/min (ref >90)
Glucose, Bld: 138 mg/dL — ABNORMAL HIGH (ref 70–99)
Potassium: 3.3 mEq/L — ABNORMAL LOW (ref 3.7–5.3)
Sodium: 138 mEq/L (ref 137–147)
Total Protein: 6.4 g/dL (ref 6.0–8.3)

## 2013-10-26 LAB — MAGNESIUM: MAGNESIUM: 1.9 mg/dL (ref 1.5–2.5)

## 2013-10-26 MED ORDER — ENSURE COMPLETE PO LIQD
237.0000 mL | Freq: Two times a day (BID) | ORAL | Status: DC
  Administered 2013-10-27 – 2013-10-30 (×6): 237 mL via ORAL

## 2013-10-26 MED ORDER — ENSURE PUDDING PO PUDG
1.0000 | Freq: Two times a day (BID) | ORAL | Status: DC
  Administered 2013-10-27 – 2013-10-31 (×7): 1 via ORAL
  Filled 2013-10-26 (×13): qty 1

## 2013-10-26 NOTE — Procedures (Signed)
US/fluoroscopic guided right DL brachial vein PICC placed. Length 39 cm. Tip SVC/RA junction. No immediate complications. Ok to use.

## 2013-10-26 NOTE — Progress Notes (Signed)
Angel [name]   DOB:[birth year]   GG#:[id number]    Subjective: He denies any recent fever, chills, night sweats or abnormal weight loss The pain in the neck is under control. Has mild dysphagia. He still feel very weak. Denies any focal neurological deficits.  Objective:  Filed Vitals:   10/26/13 0552  BP: 141/61  Pulse: 83  Temp: 98.1 F (36.7 C)  Resp: 18     Intake/Output Summary (Last 24 hours) at 10/26/13 0826 Last data filed at 10/26/13 0700  Gross per 24 hour  Intake   2545 ml  Output   3950 ml  Net  -1405 ml    GENERAL:alert, no distress and comfortable SKIN: Significant redness around the tumor site, unchanged. He has drainage from the tumor. EYES: normal, Conjunctiva are pink and non-injected, sclera clear OROPHARYNX:no exudate, no erythema and lips, buccal mucosa, and tongue normal  NECK: supple, thyroid normal size, non-tender, without nodularity LYMPH:  Significant lymphadenopathy in the neck. Not elsewhere.  LUNGS: clear to auscultation and percussion with normal breathing effort HEART: regular rate & rhythm and no murmurs and no lower extremity edema ABDOMEN:abdomen soft, non-tender and normal bowel sounds Musculoskeletal:no cyanosis of digits and no clubbing  NEURO: alert & oriented x 3 with fluent speech, no focal motor/sensory deficits   Review of telemetry showed PVCs only. No major alarms. Labs:  Lab Results  Component Value Date   WBC 25.5* 10/25/2013   HGB 14.2 10/25/2013   HCT 43.0 10/25/2013   MCV 98.2 10/25/2013   PLT 680* 10/25/2013   NEUTROABS 20.1* 10/25/2013    Lab Results  Component Value Date   NA 138 10/26/2013   K 3.3* 10/26/2013   CL 96 10/26/2013   CO2 32 10/26/2013   Assessment & Plan:  Malignant hypercalcemia  This is a medical emergency.  This is due to untreated squamous cell carcinoma of the base of tongue.  I recommend IV Decadron, IV pamidronate, IV fluids and Lasix. Repeat calcium level is improving today. Telemetry did not  show any major arrhythmia. Plan to discontinue telemetry and transfer with [facility] [place] later today. Continue on aggressive IV fluids at current rate for now.  Cancer of base of tongue with regional lymph node metastasis  PET CT scan showed no evidence of distant metastatic disease, he will be a good candidate for induction chemotherapy followed by radiation treatment.  We discussed the role of chemotherapy. The intent is for cure.  The patient will need inpatient chemotherapy.  We discussed some of the risks, benefits, side-effects of 5FU/Docetaxel and cisplatin.  Some of the short term side-effects included, though not limited to, including weight loss, life threatening infections, risk of allergic reactions, need for transfusions of blood products, nausea, vomiting, change in bowel habits, loss of hair, admission to hospital for various reasons, and risks of death.  Long term side-effects are also discussed including risks of infertility, permanent damage to nerve function, hearing loss, chronic fatigue, kidney damage with possibility needing hemodialysis, and rare secondary malignancy including bone marrow disorders.  The patient is aware that the response rates discussed earlier is not guaranteed. After a long discussion, patient made an informed decision to proceed with the prescribed plan of care and went ahead to sign the consent form today.  Plan to start chemotherapy tomorrow.  IV team is consulted for PICC line placement.  We will start him on chemotherapy hopefully by 10/27/2013.   Tobacco abuse  I spent some time counseling the patient the  importance of tobacco cessation.  He will be started on nicotine patch.   Leukocytosis  Likely due to infected necrotic tumor.  Recommend IV antibiotics.   Thrombocytosis  Likely due to infected necrotic tumor.  Recommend IV antibiotics.   Neck pain  This is due to cancer.  Continue pain medicine as needed.   DVT prophylaxis  He is on  Lovenox   Chronic constipation  This could be worse with malignant hypercalcemia and dehydration.  Recommend regular laxatives.   CODE STATUS  Full code  All questions were answered. The patient knows to call the clinic with any problems, questions or concerns.   [place], Opal, MD 10/26/2013  8:26 AM

## 2013-10-26 NOTE — Progress Notes (Signed)
Addendum  Neck Wound Ostomy nurse is consulted for wound care.

## 2013-10-26 NOTE — Progress Notes (Signed)
[facility] PROGRESS NOTE  KESHUN BERRETT ZDG:[id number] DOB: 06-Aug-1950 DOA: 10/25/2013 PCP: [name], MD Interim summary: Angel [name] is a 63 y.o. male with recently diagnosed with squamous cell carcinoma of the tongue, follows with Dr Alvy Bimler presented to ED with his wife, with complaints of generalized weakness, mechanical fall yesterday, . He reports he does not have an appetite, lost more than 20 pounds from the month of may. He reports constipation . He also reports pain in the neck mass and his pain meds at home and not helping him much. On arrival to ED, he was found to have a calcium level of 15. He was referred to medical service for admission. Chemotherapy was not started yet. Dr Alvy Bimler saw patient and requested tranfering the patient to oncology floor to start chemo on 7/1    Assessment/Plan:  Malignant hypercalcemia secondary to squamous cell carcinoma of the tongue with LAD involve ment.  - he was initially  admitted to telemetry. Started her on NS fluids at 126ml/hr. Repeat calcium level shows improvement from 15 to 13. He appears dehydrated , hence we will give him fluids till tonight and start lasix later on. IV pamidronate and IV decadron will be ordered by the oncologist Dr Alvy Bimler.EKG shows sinus rhythm with prolong qt interval.  Currently he denies any chest pain . He reports palpitations and neck pain from the tumour which has improved..  Squamous cell cancer of the tongue with lymph node mets:  - further management as per DR Alvy Bimler.  -  PICC LINE and chemo to start on 7/1  Tobacco abuse:  - refused nicotine patch.  Constipation:  - probably from the being on MS contin and hypercalcemia.  - stool softners and miralax, MOM ordered.  Hypokalemia:  - replete as needed. normal magnesium levels.  Leukocytosis:  He is afebrile, could be from the cancer vs infection. Will empirically start him on zosyn. Continue to monitor.  DVT prophylaxis.  Code Status: full  code  Family Communication: discussed with wife at bedside  Disposition Plan: transferred to med surg oncology floor.     Consultants:  Oncology Dr Alvy Bimler  Procedures:  PICC line  Antibiotics:  Zosyn 6/29  HPI/Subjective: Pain improved.   Objective: Filed Vitals:   10/26/13 1420  BP: 148/70  Pulse: 89  Temp: 98.6 F (37 C)  Resp: 16    Intake/Output Summary (Last 24 hours) at 10/26/13 1559 Last data filed at 10/26/13 1000  Gross per 24 hour  Intake   2785 ml  Output   3750 ml  Net   -965 ml   Filed Weights   10/25/13 1256 10/26/13 1420  Weight: 44.7 kg (98 lb 8.7 oz) 47 kg (103 lb 9.9 oz)    Exam:   General:  Alert afebrile comfortable  Neck: left sided neck mass with open wound draining sero sanguinous fluid.   Cardiovascular: s1s2  Respiratory: ctab  Abdomen: soft NT ND BS+  Musculoskeletal: NO PEDAL edema.     Data Reviewed: Basic Metabolic Panel:  Recent Labs Lab 10/25/13 0908 10/25/13 1529 10/26/13 0415  NA 138 138 138  K 3.1* 3.1* 3.3*  CL 90* 97 96  CO2 34* 31 32  GLUCOSE 125* 113* 138*  BUN 16 13 15   CREATININE 0.80 0.70 0.82  CALCIUM >15.0* 13.4* 13.0*  MG  --   --  1.9   Liver Function Tests:  Recent Labs Lab 10/25/13 0908 10/26/13 0415  AST 13 11  ALT 10  8  ALKPHOS 74 63  BILITOT 0.4 0.3  PROT 7.8 6.4  ALBUMIN 3.5 2.8*   No results found for this basename: LIPASE, AMYLASE,  in the last 168 hours No results found for this basename: AMMONIA,  in the last 168 hours CBC:  Recent Labs Lab 10/25/13 0908  WBC 25.5*  NEUTROABS 20.1*  HGB 14.2  HCT 43.0  MCV 98.2  PLT 680*   Cardiac Enzymes: No results found for this basename: CKTOTAL, CKMB, CKMBINDEX, TROPONINI,  in the last 168 hours BNP (last 3 results) No results found for this basename: PROBNP,  in the last 8760 hours CBG: No results found for this basename: GLUCAP,  in the last 168 hours  No results found for this or any previous visit (from the  past 240 hour(s)).   Studies: No results found.  Scheduled Meds: . antiseptic oral rinse  15 mL Mouth Rinse BID  . chlorhexidine  15 mL Mouth/Throat BID  . dexamethasone  8 mg Intravenous TID  . enoxaparin (LOVENOX) injection  30 mg Subcutaneous Q24H  . feeding supplement (ENSURE COMPLETE)  237 mL Oral BID WC  . feeding supplement (ENSURE)  1 Container Oral BID BM  . piperacillin-tazobactam (ZOSYN)  IV  3.375 g Intravenous Q8H  . polyethylene glycol  17 g Oral Daily  . senna-docusate  2 tablet Oral BID   Continuous Infusions: . sodium chloride 150 mL/hr at 10/26/13 0306    Active Problems:   Cancer of base of tongue   Tobacco abuse   Weight loss   Neck pain   Hypercalcemia of malignancy   Hypercalcemia   Leukocytosis, unspecified   Hypokalemia   Protein-calorie malnutrition, severe    Time spent: 35 minutes.     [facility] Pager [phone]. If 7PM-7AM, please contact night-coverage at [email or web address], password [facility] 10/26/2013, 3:59 PM  LOS: 1 day

## 2013-10-26 NOTE — Progress Notes (Signed)
INITIAL NUTRITION ASSESSMENT  DOCUMENTATION CODES Per approved criteria  -Severe malnutrition in the context of chronic illness -Underweight  Pt meets criteria for severe MALNUTRITION in the context of chronic illlness as evidenced by 16% body weight loss in one month, PO intake <75% for > one month.   INTERVENTION: -Recommend Ensure Pudding po BID, each supplement provides 170 kcal and 4 grams of protein -Recommend Ensure Complete po BID, each supplement provides 350 kcal and 13 grams of protein -Trial MagicCup with meals  NUTRITION DIAGNOSIS: Inadequate oral intake related to difficulty swallowing/early satiety as evidenced by PO intake <75%, 16% body weight loss in one month.   Goal: Pt to meet >/= 90% of their estimated nutrition needs    Monitor:  Pt to meet >/= 90% of their estimated nutrition needs    Reason for Assessment: Underweight BMI/MST  63 y.o. male  Admitting Dx: <principal problem not specified>  ASSESSMENT: Angel [name] is a 63 y.o. male with recently diagnosed with squamous cell carcinoma of the tongue, follows with Dr Alvy Bimler presented to ED with his wife, with complaints of generalized weakness, mechanical fall yesterday  -Pt's wife endorsed unintentional wt loss of 20 lbs in one month -Has decreased appetite for past 2 weeks, noted dysphagia and early satiety -Diet recall indicates pt consumes one Ensure Complete daily, and small bites of applesauce, mashed potatoes, puddings.  -Current PO intake 50%. Was able to tolerate chopped sausage/eggs/grits and thin liquids (juices, coffee) -Discussed addition of Ensure pudding BID, and Ensure Complete. Was willing to trial MagicCup supplement as additional nutrient replenishment -Being followed by outpatient oncology RD, last seen on 6/17  Height: Ht Readings from Last 1 Encounters:  10/25/13 5' 5.5" (1.664 m)    Weight: Wt Readings from Last 1 Encounters:  10/25/13 98 lb 8.7 oz (44.7 kg)    Ideal  Body Weight: 142 lbs  % Ideal Body Weight: 69%  Wt Readings from Last 10 Encounters:  10/25/13 98 lb 8.7 oz (44.7 kg)  10/14/13 106 lb 14.4 oz (48.49 kg)  10/13/13 106 lb (48.081 kg)  10/08/13 114 lb (51.71 kg)  10/08/13 114 lb (51.71 kg)  10/05/13 114 lb 8 oz (51.937 kg)  10/04/13 117 lb (53.071 kg)  10/04/13 117 lb (53.071 kg)  09/29/13 117 lb 9 oz (53.326 kg)  09/17/13 115 lb 9.6 oz (52.436 kg)    Usual Body Weight: 117 lbs per previous medical records  % Usual Body Weight: 84%  BMI:  Body mass index is 16.14 kg/(m^2). Underweight  Estimated Nutritional Needs: Kcal: 1650-1850 Protein: 75-90 gram Fluid: 1800 ml/daily  Skin: l.sided neck mass  Diet Order: General  EDUCATION NEEDS: -No education needs identified at this time   Intake/Output Summary (Last 24 hours) at 10/26/13 1214 Last data filed at 10/26/13 1000  Gross per 24 hour  Intake   2785 ml  Output   3950 ml  Net  -1165 ml    Last BM: 6/29   Labs:   Recent Labs Lab 10/25/13 0908 10/25/13 1529 10/26/13 0415  NA 138 138 138  K 3.1* 3.1* 3.3*  CL 90* 97 96  CO2 34* 31 32  BUN 16 13 15   CREATININE 0.80 0.70 0.82  CALCIUM >15.0* 13.4* 13.0*  MG  --   --  1.9  GLUCOSE 125* 113* 138*    CBG (last 3)  No results found for this basename: GLUCAP,  in the last 72 hours  Scheduled Meds: . antiseptic oral rinse  15 mL Mouth Rinse BID  . chlorhexidine  15 mL Mouth/Throat BID  . dexamethasone  8 mg Intravenous TID  . enoxaparin (LOVENOX) injection  30 mg Subcutaneous Q24H  . feeding supplement (ENSURE COMPLETE)  237 mL Oral BID WC  . feeding supplement (ENSURE)  1 Container Oral BID BM  . pamidronate  90 mg Intravenous Once  . piperacillin-tazobactam (ZOSYN)  IV  3.375 g Intravenous Q8H  . polyethylene glycol  17 g Oral Daily  . senna-docusate  2 tablet Oral BID    Continuous Infusions: . sodium chloride 150 mL/hr at 10/26/13 2035    Past Medical History  Diagnosis Date  . Constipation  due to pain medication   . Malignant neoplasm of base of tongue     left neck lymph and Left BOT cancer  . Current smoker   . 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 Testicle surgery      as a infant  . Appendectomy    . Mandible surgery      from [place]  . Back surgery      Lumbar  . Tonsillectomy    . Panendoscopy N/A 10/04/2013    Procedure: Direct Laryngoscopy  WITH BIOPSY;  Surgeon: Melida Quitter, MD;  Location: [facility];  Service: ENT;  Laterality: N/A;  . Esophagoscopy  10/04/2013    Procedure: ESOPHAGOSCOPY;  Surgeon: Melida Quitter, MD;  Location: [facility];  Service: ENT;;  . Multiple extractions with alveoloplasty N/A 10/08/2013    Procedure: extraction of tooth #'s 2,3,4,5,6,11,12,13,14,15,18,19,20,21,22,23,24,25,26, 27,28, 29, 31 with alveoloplasty ;  Surgeon: Lenn Cal, DDS;  Location: [place];  Service: Oral Surgery;  Laterality: N/A;    Atlee Abide MS RD LDN Clinical Dietitian DHRCB:[phone]

## 2013-10-26 NOTE — Consult Note (Signed)
WOC wound consult note Reason for Consult:Large left-sided neck mass with wound draining Wound type:neoplasm Pressure Ulcer POA: No Measurement: Open wound is 1.5cm x 3cm x 0.4cm depth within a 10cm x 12cm firm, warm and erythemic mass Wound bed:40% red, 60% yellow slough Drainage (amount, consistency, odor) serous to serosanguinous Periwound:erythematous, friable Dressing procedure/placement/frequency: I will suggest a non-contact layer of silicone (Mepitel, Kellie Simmering [phone]) topped with gauze and held in place with an antimicrobial textile, [employer]+ Kellie Simmering # [phone]).  The gauze can be replaced as needed, but at least twice daily.  We'll try this as an initial intervention and see if his comfort level improves. [place] nursing team will not follow, but will remain available to this patient, the nursing and medical teams.  Please re-consult if needed. Thanks, Maudie Flakes, MSN, RN, [place], [place], [place] [phone])

## 2013-10-27 ENCOUNTER — Encounter: Payer: Self-pay | Admitting: Nutrition

## 2013-10-27 ENCOUNTER — Ambulatory Visit: Payer: Self-pay

## 2013-10-27 DIAGNOSIS — R4789 Other speech disturbances: Secondary | ICD-10-CM

## 2013-10-27 DIAGNOSIS — E876 Hypokalemia: Secondary | ICD-10-CM

## 2013-10-27 DIAGNOSIS — R4182 Altered mental status, unspecified: Secondary | ICD-10-CM | POA: Diagnosis present

## 2013-10-27 DIAGNOSIS — K5909 Other constipation: Secondary | ICD-10-CM | POA: Diagnosis present

## 2013-10-27 DIAGNOSIS — R531 Weakness: Secondary | ICD-10-CM | POA: Diagnosis present

## 2013-10-27 DIAGNOSIS — R4702 Dysphasia: Secondary | ICD-10-CM | POA: Diagnosis present

## 2013-10-27 DIAGNOSIS — D6959 Other secondary thrombocytopenia: Secondary | ICD-10-CM

## 2013-10-27 DIAGNOSIS — E43 Unspecified severe protein-calorie malnutrition: Secondary | ICD-10-CM

## 2013-10-27 DIAGNOSIS — W19XXXA Unspecified fall, initial encounter: Secondary | ICD-10-CM

## 2013-10-27 DIAGNOSIS — D75839 Thrombocytosis, unspecified: Secondary | ICD-10-CM | POA: Diagnosis present

## 2013-10-27 DIAGNOSIS — D473 Essential (hemorrhagic) thrombocythemia: Secondary | ICD-10-CM | POA: Diagnosis present

## 2013-10-27 DIAGNOSIS — D630 Anemia in neoplastic disease: Secondary | ICD-10-CM

## 2013-10-27 DIAGNOSIS — R404 Transient alteration of awareness: Secondary | ICD-10-CM

## 2013-10-27 LAB — BASIC METABOLIC PANEL
BUN: 17 mg/dL (ref 6–23)
CHLORIDE: 103 meq/L (ref 96–112)
CO2: 28 meq/L (ref 19–32)
Calcium: 11.4 mg/dL — ABNORMAL HIGH (ref 8.4–10.5)
Creatinine, Ser: 0.7 mg/dL (ref 0.50–1.35)
GFR calc Af Amer: >90 mL/min (ref >90)
GFR calc non Af Amer: >90 mL/min (ref >90)
Glucose, Bld: 136 mg/dL — ABNORMAL HIGH (ref 70–99)
Potassium: 3.1 mEq/L — ABNORMAL LOW (ref 3.7–5.3)
Sodium: 140 mEq/L (ref 137–147)

## 2013-10-27 LAB — CBC
HEMATOCRIT: 31.7 % — AB (ref 39.0–52.0)
Hemoglobin: 10.7 g/dL — ABNORMAL LOW (ref 13.0–17.0)
MCH: 32.9 pg (ref 26.0–34.0)
MCHC: 33.8 g/dL (ref 30.0–36.0)
MCV: 97.5 fL (ref 78.0–100.0)
Platelets: 543 10*3/uL — ABNORMAL HIGH (ref 150–400)
RBC: 3.25 MIL/uL — AB (ref 4.22–5.81)
RDW: 14 % (ref 11.5–15.5)
WBC: 32.1 10*3/uL — AB (ref 4.0–10.5)

## 2013-10-27 MED ORDER — SODIUM CHLORIDE 0.9 % IV SOLN: Freq: Once | INTRAVENOUS | Status: AC | PRN

## 2013-10-27 MED ORDER — HEPARIN SOD (PORK) LOCK FLUSH 100 UNIT/ML IV SOLN: 250.0000 [IU] | Freq: Once | INTRAVENOUS | Status: AC | PRN

## 2013-10-27 MED ORDER — SODIUM CHLORIDE 0.9 % IJ SOLN: 3.0000 mL | INTRAMUSCULAR | Status: DC | PRN

## 2013-10-27 MED ORDER — DEXAMETHASONE 4 MG PO TABS
4.0000 mg | ORAL_TABLET | Freq: Two times a day (BID) | ORAL | Status: AC
Start: 2013-10-28 — End: 2013-10-29
  Administered 2013-10-28 – 2013-10-29 (×4): 4 mg via ORAL
  Filled 2013-10-27 (×4): qty 1

## 2013-10-27 MED ORDER — DIPHENHYDRAMINE HCL 50 MG/ML IJ SOLN: 25.0000 mg | Freq: Once | INTRAMUSCULAR | Status: AC | PRN

## 2013-10-27 MED ORDER — COLD PACK MISC ONCOLOGY
1.0000 | Freq: Once | Status: AC | PRN
  Filled 2013-10-27: qty 1

## 2013-10-27 MED ORDER — DEXTROSE 5 % IV SOLN
75.0000 mg/m2 | Freq: Once | INTRAVENOUS | Status: AC
  Administered 2013-10-27: 110 mg via INTRAVENOUS
  Filled 2013-10-27: qty 11

## 2013-10-27 MED ORDER — PALONOSETRON HCL INJECTION 0.25 MG/5ML
0.2500 mg | Freq: Once | INTRAVENOUS | Status: AC
  Administered 2013-10-27: 0.25 mg via INTRAVENOUS
  Filled 2013-10-27: qty 5

## 2013-10-27 MED ORDER — DIPHENHYDRAMINE HCL 50 MG/ML IJ SOLN: 50.0000 mg | Freq: Once | INTRAMUSCULAR | Status: AC | PRN

## 2013-10-27 MED ORDER — ALTEPLASE 2 MG IJ SOLR: 2.0000 mg | Freq: Once | INTRAMUSCULAR | Status: AC | PRN

## 2013-10-27 MED ORDER — EPINEPHRINE HCL 1 MG/ML IJ SOLN: 0.5000 mg | Freq: Once | INTRAMUSCULAR | Status: AC | PRN

## 2013-10-27 MED ORDER — SODIUM CHLORIDE 0.9 % IJ SOLN: 10.0000 mL | INTRAMUSCULAR | Status: DC | PRN

## 2013-10-27 MED ORDER — DEXAMETHASONE SODIUM PHOSPHATE 4 MG/ML IJ SOLN
8.0000 mg | Freq: Three times a day (TID) | INTRAMUSCULAR | Status: AC
  Administered 2013-10-27 (×2): 8 mg via INTRAVENOUS
  Filled 2013-10-27 (×2): qty 2

## 2013-10-27 MED ORDER — EPINEPHRINE HCL 0.1 MG/ML IJ SOSY: 0.2500 mg | PREFILLED_SYRINGE | Freq: Once | INTRAMUSCULAR | Status: AC | PRN

## 2013-10-27 MED ORDER — SODIUM CHLORIDE 0.9 % IV SOLN
Freq: Once | INTRAVENOUS | Status: AC
  Administered 2013-10-27: 12:00:00 via INTRAVENOUS

## 2013-10-27 MED ORDER — POTASSIUM CHLORIDE 2 MEQ/ML IV SOLN
Freq: Once | INTRAVENOUS | Status: AC
  Administered 2013-10-27: 08:00:00 via INTRAVENOUS
  Filled 2013-10-27: qty 10

## 2013-10-27 MED ORDER — CISPLATIN CHEMO INJECTION 100MG/100ML
75.0000 mg/m2 | Freq: Once | INTRAVENOUS | Status: AC
  Administered 2013-10-27: 110 mg via INTRAVENOUS
  Filled 2013-10-27: qty 110

## 2013-10-27 MED ORDER — POTASSIUM CHLORIDE 10 MEQ/100ML IV SOLN
10.0000 meq | INTRAVENOUS | Status: AC
  Administered 2013-10-27 (×4): 10 meq via INTRAVENOUS
  Filled 2013-10-27 (×4): qty 100

## 2013-10-27 MED ORDER — DEXTROSE 5 % IV SOLN
750.0000 mg/m2/d | INTRAVENOUS | Status: AC
Start: 2013-10-27 — End: 2013-11-01
  Administered 2013-10-27 – 2013-10-31 (×5): 1150 mg via INTRAVENOUS
  Filled 2013-10-27 (×7): qty 23

## 2013-10-27 MED ORDER — HEPARIN SOD (PORK) LOCK FLUSH 100 UNIT/ML IV SOLN: 500.0000 [IU] | Freq: Once | INTRAVENOUS | Status: AC | PRN

## 2013-10-27 MED ORDER — ALBUTEROL SULFATE (2.5 MG/3ML) 0.083% IN NEBU: 2.5000 mg | INHALATION_SOLUTION | Freq: Once | RESPIRATORY_TRACT | Status: AC | PRN

## 2013-10-27 MED ORDER — DEXAMETHASONE SODIUM PHOSPHATE 20 MG/5ML IJ SOLN
12.0000 mg | Freq: Once | INTRAMUSCULAR | Status: DC
  Filled 2013-10-27: qty 5

## 2013-10-27 MED ORDER — FAMOTIDINE IN NACL 20-0.9 MG/50ML-% IV SOLN: 20.0000 mg | Freq: Once | INTRAVENOUS | Status: AC | PRN

## 2013-10-27 MED ORDER — METHYLPREDNISOLONE SODIUM SUCC 125 MG IJ SOLR: 125.0000 mg | Freq: Once | INTRAMUSCULAR | Status: AC | PRN

## 2013-10-27 MED ORDER — SODIUM CHLORIDE 0.9 % IV SOLN
150.0000 mg | Freq: Once | INTRAVENOUS | Status: AC
  Administered 2013-10-27: 150 mg via INTRAVENOUS
  Filled 2013-10-27: qty 5

## 2013-10-27 NOTE — Progress Notes (Signed)
K+ 3.1 paged NP on call. Orders received.

## 2013-10-27 NOTE — Progress Notes (Signed)
Progress Note   Angel [name] HCW:[id number] DOB: Dec 04, 1950 DOA: 10/25/2013 PCP: [name], MD   Brief Narrative:   Angel [name] is an 63 y.o. 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 Upon initial evaluation in the ED, his calcium levels found to be 15. Chemotherapy with Taxotere, cis-platinum, and 5-FU initiated.  Assessment/Plan:   Principal Problem:   Hypercalcemia of malignancy with generalized weakness/fall / altered mental status  Status post IV Decadron and pamidronate. He is also received IV fluids/Lasix.  Decadron to be completed 10/28/13.  Continue IV fluids.  Calcium level improving.  PT/OT evaluations requested.  Active Problems:   Cancer of base of tongue with regional lymph node metastasis/neck pain  Induction chemotherapy with Taxotere, cis-platinum, and 5-FU started per oncologist.  Radiation therapy will be initiated when cleared by dentistry/oncology.    Tobacco abuse  Counseled. Refuses nicotine patch.    Leukocytosis, unspecified  Thought to be from tumor necrosis and affects of dexamethasone.  Continue empiric Zosyn.    Chronic constipation  Secondary to hypercalcemia and medication side effects.  Continue aggressive bowel regimen.    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 Transfuse for hemoglobin less than 7.    Thrombocytosis  Likely reactive secondary to infected necrotic tumor.    Hypokalemia  Given 4 runs of IV potassium today.    Protein-calorie malnutrition, severe / weight loss / dysphagia  Continue Ensure nutritional supplements.    DVT Prophylaxis  Continue SCDs and Lovenox.  Code Status: Full. Family Communication: Wife at bedside. Disposition Plan: Home when stable.   IV Access:    PICC line   Procedures:    Ultrasound/fluoroscopic guided right DL  brachial vein PICC line placed 10/26/13  Radiographic procedures as noted below.   Medical Consultants:    Dr. [name], Oncology   Other Consultants:    Dietician   Anti-Infectives:    Zosyn 10/25/13--->   Subjective:   Angel [name] is edentulous and is having trouble chewing his meals. He denies current nausea, vomiting, or diarrhea. He has intermittent neck pain but reports that his pain is well-controlled with his current medication regimen.  Objective:    Filed Vitals:   10/27/13 1035 10/27/13 1055 10/27/13 1110 10/27/13 1125  BP: 161/91 171/93 175/94 172/99  Pulse: 95 93 90 88  Temp: 97.9 F (36.6 C)     TempSrc: Oral     Resp: 16 12 14 16   Height:      Weight:      SpO2: 98% 97% 95% 98%    Intake/Output Summary (Last 24 hours) at 10/27/13 1357 Last data filed at 10/27/13 1308  Gross per 24 hour  Intake 4530.12 ml  Output   3550 ml  Net 980.12 ml    Exam: Gen:  NAD Neck: Large left neck mass Cardiovascular:  RRR, No M/R/G Respiratory:  Lungs CTAB Gastrointestinal:  Abdomen soft, NT/ND, + BS Extremities:  No C/E/C   Data Reviewed:    Labs: Basic Metabolic Panel:  Recent Labs Lab 10/25/13 0908 10/25/13 1529 10/26/13 0415 10/27/13 0513  NA 138 138 138 140  K 3.1* 3.1* 3.3* 3.1*  CL 90* 97 96 103  CO2 34* 31 32 28  GLUCOSE 125* 113* 138* 136*  BUN 16 13 15 17   CREATININE 0.80 0.70 0.82 0.70  CALCIUM >15.0* 13.4* 13.0* 11.4*  MG  --   --  1.9  --    GFR Estimated Creatinine Clearance: 64.5 ml/min (by C-G formula based on Cr of 0.7). Liver Function Tests:  Recent Labs Lab 10/25/13 0908 10/26/13 0415  AST 13 11  ALT 10 8  ALKPHOS 74 63  BILITOT 0.4 0.3  PROT 7.8 6.4  ALBUMIN 3.5 2.8*   CBC:  Recent Labs Lab 10/25/13 0908 10/27/13 0513  WBC 25.5* 32.1*  NEUTROABS 20.1*  --   HGB 14.2 10.7*  HCT 43.0 31.7*  MCV 98.2 97.5  PLT 680* 543*   Sepsis Labs:  Recent Labs Lab 10/25/13 0908 10/25/13 1119  10/27/13 0513  WBC 25.5*  --  32.1*  LATICACIDVEN  --  1.16  --    Microbiology No results found for this or any previous visit (from the past 240 hour(s)).   Radiographs/Studies:   Nm Pet Image Initial (pi) Skull Base To Thigh  10/15/2013   CLINICAL DATA:  Initial treatment strategy for tongue cancer.  EXAM: NUCLEAR MEDICINE PET SKULL BASE TO THIGH  TECHNIQUE: 6.4 mCi F-18 FDG was injected intravenously. Full-ring PET imaging was performed from the skull base to thigh after the radiotracer. CT data was obtained and used for attenuation correction and anatomic localization.  FASTING BLOOD GLUCOSE:  Value: 147 mg/dl  COMPARISON:  None.  FINDINGS: NECK  Large base of tongue mass measures 4.7 cm and has an SUV max equal 14.6. Large, necrotic lymph node within the left side of neck measures 8.1 x 7.8 cm and has an SUV max equal to 14.5. Hypermetabolic right level 2 lymph node measures 2.3 cm and has an SUV max equal to 10. There is a small right-sided level 4 lymph node which measures 8 mm and has an SUV max equal to 2.8.  CHEST  No hypermetabolic mediastinal or hilar nodes. No suspicious pulmonary nodules on the CT scan. Clustered nodules within the posterior right upper lobe are identified, image 23/series 7. There is associated cylindrical type bronchiectasis. Mild changes of centrilobular emphysema noted. The heart size is normal. No pericardial effusion. There is mild calcified atherosclerotic disease involving the thoracic aorta. Calcification involving the LAD coronary artery noted.  ABDOMEN/PELVIS  No abnormal hypermetabolic activity within the liver, pancreas, adrenal glands, or spleen. No hypermetabolic lymph nodes in the abdomen or pelvis.  SKELETON  No focal hypermetabolic activity to suggest skeletal metastasis.  IMPRESSION: 1. Examination is positive for large, hypermetabolic base of tongue mass. 2. Bilateral cervical lymph node metastasis. 3. No evidence for hypermetabolic metastasis to the  chest abdomen or pelvis. 4. Postinflammatory/infectious changes within the posterior right upper lobe. 5. Atherosclerotic disease including multi vessel coronary artery calcifications.   Electronically Signed   By: Kerby Moors M.D.   On: 10/15/2013 09:53   Ir Fluoro Guide Cv Line Right  10/26/2013   CLINICAL DATA:  History of squamous cell cancer of the oropharynx; central venous access is requested for chemotherapy.  EXAM: IR RIGHT FLOURO GUIDE CV LINE; IR ULTRASOUND GUIDANCE VASC ACCESS RIGHT  FLUOROSCOPY TIME:  24 seconds  TECHNIQUE: The right arm was prepped with chlorhexidine, draped in the usual sterile fashion using maximum barrier technique (cap and mask, sterile gown, sterile gloves, large sterile sheet, hand hygiene and cutaneous antiseptic). Local anesthesia was attained by infiltration with 1% lidocaine.  Ultrasound demonstrated patency of the right brachial vein, and this was documented with an image. Under real-time ultrasound guidance, this vein was accessed with a 21 gauge micropuncture  needle and image documentation was performed. The needle was exchanged over a guidewire for a peel-away sheath through which a 39 cm 5 [place] double lumen power injectable PICC was advanced, and positioned with its tip at the lower SVC/right atrial junction. Fluoroscopy during the procedure and fluoro spot radiograph confirms appropriate catheter position. The catheter was flushed, secured to the skin with Prolene sutures, and covered with a sterile dressing.  COMPLICATIONS: None.  The patient tolerated the procedure well.  IMPRESSION: Successful placement of a right arm PICC with sonographic and fluoroscopic guidance. The catheter is ready for use.  Read by: Rowe Robert ,P.A.-C.   Electronically Signed   By: Aletta Edouard M.D.   On: 10/26/2013 14:14   Ir US Guide Vasc Access Right  10/26/2013   CLINICAL DATA:  History of squamous cell cancer of the oropharynx; central venous access is requested for  chemotherapy.  EXAM: IR RIGHT FLOURO GUIDE CV LINE; IR ULTRASOUND GUIDANCE VASC ACCESS RIGHT  FLUOROSCOPY TIME:  24 seconds  TECHNIQUE: The right arm was prepped with chlorhexidine, draped in the usual sterile fashion using maximum barrier technique (cap and mask, sterile gown, sterile gloves, large sterile sheet, hand hygiene and cutaneous antiseptic). Local anesthesia was attained by infiltration with 1% lidocaine.  Ultrasound demonstrated patency of the right brachial vein, and this was documented with an image. Under real-time ultrasound guidance, this vein was accessed with a 21 gauge micropuncture needle and image documentation was performed. The needle was exchanged over a guidewire for a peel-away sheath through which a 39 cm 5 [place] double lumen power injectable PICC was advanced, and positioned with its tip at the lower SVC/right atrial junction. Fluoroscopy during the procedure and fluoro spot radiograph confirms appropriate catheter position. The catheter was flushed, secured to the skin with Prolene sutures, and covered with a sterile dressing.  COMPLICATIONS: None.  The patient tolerated the procedure well.  IMPRESSION: Successful placement of a right arm PICC with sonographic and fluoroscopic guidance. The catheter is ready for use.  Read by: Rowe Robert ,P.A.-C.   Electronically Signed   By: Aletta Edouard M.D.   On: 10/26/2013 14:14    Medications:   . antiseptic oral rinse  15 mL Mouth Rinse BID  . chlorhexidine  15 mL Mouth/Throat BID  . dexamethasone  8 mg Intravenous TID  . [START ON 10/28/2013] dexamethasone  4 mg Oral Q12H  . enoxaparin (LOVENOX) injection  30 mg Subcutaneous Q24H  . feeding supplement (ENSURE COMPLETE)  237 mL Oral BID WC  . feeding supplement (ENSURE)  1 Container Oral BID BM  . FLUOROURACIL (ADRUCIL) CHEMO infusion For Inpatient Use  750 mg/m2/day (Treatment Plan Actual) Intravenous Q24H  . piperacillin-tazobactam (ZOSYN)  IV  3.375 g Intravenous Q8H  .  polyethylene glycol  17 g Oral Daily  . senna-docusate  2 tablet Oral BID   Continuous Infusions: . sodium chloride 1,000 mL (10/27/13 0511)    Time spent: 35 minutes with > 50% of time discussing current diagnostic test results, clinical impression and plan of care with the patient and his wife.    LOS: 2 days   RAMA,CHRISTINA  [facility] Pager [phone]. If unable to reach me by pager, please call my cell phone at [phone].  *Please refer to [email or web address], password [email or web address] to get updated schedule on who will round on this patient, as hospitalists switch teams weekly. If 7PM-7AM, please contact night-coverage at [email or web address], password [email or web address] for any overnight needs.  10/27/2013, 1:57 PM    **  Disclaimer: This note was dictated with voice recognition software. Similar sounding words can inadvertently be transcribed and this note may contain transcription errors which may not have been corrected upon publication of note.**

## 2013-10-27 NOTE — Progress Notes (Signed)
Angel [name]   DOB:1951/03/01   KC#:[id number]   TAV#:[id number] I have seen the patient, examined him and edited the notes as follows  Subjective: Patient seen and examined. No new events overnight. He demonstrates intermittent periods of lucidity and confusion, likely related to hypercalcemia. Denies headaches or vision changes. Appetite decreased, especially with soft foods. Tolerates liquids, including Ensure. No bowel movement to date despite laxatives given on prior day (although nursing staff was able to find documentation of recent BM on 6/29). Pain controlled with current regimen. No further palpitations. Denies shortness of breath. Denies nausea or vomiting.  To start chemotherapy today.  Scheduled Meds: . antiseptic oral rinse  15 mL Mouth Rinse BID  . chlorhexidine  15 mL Mouth/Throat BID  . dexamethasone  8 mg Intravenous TID  . enoxaparin (LOVENOX) injection  30 mg Subcutaneous Q24H  . feeding supplement (ENSURE COMPLETE)  237 mL Oral BID WC  . feeding supplement (ENSURE)  1 Container Oral BID BM  . piperacillin-tazobactam (ZOSYN)  IV  3.375 g Intravenous Q8H  . polyethylene glycol  17 g Oral Daily  . potassium chloride  10 mEq Intravenous Q1 Hr x 4  . senna-docusate  2 tablet Oral BID   Continuous Infusions: . sodium chloride 1,000 mL (10/27/13 0511)   PRN Meds:.acetaminophen, magnesium hydroxide, morphine, morphine injection, ondansetron (ZOFRAN) IV, ondansetron, promethazine   Objective:  Filed Vitals:   10/26/13 2242  BP: 167/96  Pulse: 83  Temp: 98.6 F (37 C)  Resp: 16      Intake/Output Summary (Last 24 hours) at 10/27/13 0725 Last data filed at 10/27/13 0641  Gross per 24 hour  Intake 3229.12 ml  Output   1725 ml  Net 1504.12 ml    ECOG PERFORMANCE STATUS: 1-2   GENERAL:alert, with intermittent periods of confusion SKIN: skin color, texture, turgor are normal, no rashes or significant lesions. Noted discoloration around his neck wound.  EYES:  normal, conjunctiva are pink and non-injected, sclera clear  OROPHARYNX:no exudate, no erythema and lips, buccal mucosa, and tongue normal . Poor dentition is noted  NECK: a large left-sided neck mass with wound draining.  LYMPH: Bilateral cervical lymphadenopathy. No lymphadenopathy elsewhere.  LUNGS: clear to auscultation and percussion with normal breathing effort  HEART: regular rate & rhythm and no murmurs and no lower extremity edema  ABDOMEN:abdomen soft, non-tender and normal bowel sounds  Musculoskeletal:no cyanosis of digits and no clubbing  PSYCH: alert & oriented to self and to year. Cannot recall place, with fluent speech  NEURO: no focal motor/sensory deficits    CBG (last 3)  No results found for this basename: GLUCAP,  in the last 72 hours   Labs:   Recent Labs Lab 10/25/13 0908 10/27/13 0513  WBC 25.5* 32.1*  HGB 14.2 10.7*  HCT 43.0 31.7*  PLT 680* 543*  MCV 98.2 97.5  MCH 32.4 32.9  MCHC 33.0 33.8  RDW 14.1 14.0  LYMPHSABS 2.3  --   MONOABS 2.3*  --   EOSABS 0.8*  --   BASOSABS 0.0  --      Chemistries:    Recent Labs Lab 10/25/13 0908 10/25/13 1529 10/26/13 0415 10/27/13 0513  NA 138 138 138 140  K 3.1* 3.1* 3.3* 3.1*  CL 90* 97 96 103  CO2 34* 31 32 28  GLUCOSE 125* 113* 138* 136*  BUN 16 13 15 17   CREATININE 0.80 0.70 0.82 0.70  CALCIUM >15.0* 13.4* 13.0* 11.4*  MG  --   --  1.9  --   AST 13  --  11  --   ALT 10  --  8  --   ALKPHOS 74  --  63  --   BILITOT 0.4  --  0.3  --     GFR Estimated Creatinine Clearance: 64.5 ml/min (by C-G formula based on Cr of 0.7).  Liver Function Tests:  Recent Labs Lab 10/25/13 0908 10/26/13 0415  AST 13 11  ALT 10 8  ALKPHOS 74 63  BILITOT 0.4 0.3  PROT 7.8 6.4  ALBUMIN 3.5 2.8*       Imaging Studies:  Ir Fluoro Guide Cv Line Right  10/26/2013   CLINICAL DATA:  History of squamous cell cancer of the oropharynx; central venous access is requested for chemotherapy.  EXAM: IR RIGHT  FLOURO GUIDE CV LINE; IR ULTRASOUND GUIDANCE VASC ACCESS RIGHT  FLUOROSCOPY TIME:  24 seconds  TECHNIQUE: The right arm was prepped with chlorhexidine, draped in the usual sterile fashion using maximum barrier technique (cap and mask, sterile gown, sterile gloves, large sterile sheet, hand hygiene and cutaneous antiseptic). Local anesthesia was attained by infiltration with 1% lidocaine.  Ultrasound demonstrated patency of the right brachial vein, and this was documented with an image. Under real-time ultrasound guidance, this vein was accessed with a 21 gauge micropuncture needle and image documentation was performed. The needle was exchanged over a guidewire for a peel-away sheath through which a 39 cm 5 [place] double lumen power injectable PICC was advanced, and positioned with its tip at the lower SVC/right atrial junction. Fluoroscopy during the procedure and fluoro spot radiograph confirms appropriate catheter position. The catheter was flushed, secured to the skin with Prolene sutures, and covered with a sterile dressing.  COMPLICATIONS: None.  The patient tolerated the procedure well.  IMPRESSION: Successful placement of a right arm PICC with sonographic and fluoroscopic guidance. The catheter is ready for use.  Read by: Rowe Robert ,P.A.-C.   Electronically Signed   By: Aletta Edouard M.D.   On: 10/26/2013 14:14   Ir US Guide Vasc Access Right  10/26/2013   CLINICAL DATA:  History of squamous cell cancer of the oropharynx; central venous access is requested for chemotherapy.  EXAM: IR RIGHT FLOURO GUIDE CV LINE; IR ULTRASOUND GUIDANCE VASC ACCESS RIGHT  FLUOROSCOPY TIME:  24 seconds  TECHNIQUE: The right arm was prepped with chlorhexidine, draped in the usual sterile fashion using maximum barrier technique (cap and mask, sterile gown, sterile gloves, large sterile sheet, hand hygiene and cutaneous antiseptic). Local anesthesia was attained by infiltration with 1% lidocaine.  Ultrasound demonstrated  patency of the right brachial vein, and this was documented with an image. Under real-time ultrasound guidance, this vein was accessed with a 21 gauge micropuncture needle and image documentation was performed. The needle was exchanged over a guidewire for a peel-away sheath through which a 39 cm 5 [place] double lumen power injectable PICC was advanced, and positioned with its tip at the lower SVC/right atrial junction. Fluoroscopy during the procedure and fluoro spot radiograph confirms appropriate catheter position. The catheter was flushed, secured to the skin with Prolene sutures, and covered with a sterile dressing.  COMPLICATIONS: None.  The patient tolerated the procedure well.  IMPRESSION: Successful placement of a right arm PICC with sonographic and fluoroscopic guidance. The catheter is ready for use.  Read by: Rowe Robert ,P.A.-C.   Electronically Signed   By: Aletta Edouard M.D.   On: 10/26/2013 14:14    Assessment/Plan:  63 y.o.   1. Cancer of base of tongue with regional lymph node metastasis  PET CT scan showed no evidence of distant metastatic disease. He is a good candidate for induction chemotherapy followed by radiation treatment, with intent to cure.  IV team performed PICC line placement on 6/30 All risks and benefits of chemotherapy with  5FU/Docetaxel and cisplatin were discussed and patient signed consent form.  D1C1 to start on 7/1  2. Malignant hypercalcemia  Due to untreated squamous cell carcinoma of the base of tongue.   IV Decadron, IV pamidronate, IV fluids and Lasix were initiated on 6/29. Repeat calcium level is improving today to 11.4.  Continue on aggressive IV fluids at current rate for now.  Continue IV Decadron one more day tomorrow  3.Tobacco abuse  Counseling was provided, but he refuses nicotine patch at this time  4.Leukocytosis  Likely due to infected necrotic tumor and dexamethasone.  He is afebrile On  IV antibiotics with Zosyn day 2.    5.Thrombocytosis  Likely due to infected necrotic tumor.  On day 2 of IV Zosyn and IV fluids Improving. Continue to monitor  6. Anemia Likely due to malignancy,infection, serosanguineous drainage from left neck mass, hydration, blood draws, antibiotics and malnutrition.  Monitor carefully. No transfusion indicated at this time.   7.Neck pain  Secondary to cancer Continue pain medicine as needed. Current regimen adequate  8.DVT prophylaxis  on Lovenox   9.Chronic constipation  Exacerbated by malignant hypercalcemia, decreased mobility, pain medications and dehydration. No bowel movement since admission. On Miralax daily (last dose 6/30),Senokot bid and Milk of Magnesium with stool softeners.   Repeat dose today. Add Lactulose.  10. Severe Malnutrition due to large left neck mass, dysphagia On Ensure bid as per Nutritionist recommendation.   11.Hypokalemia In the setting of diuretics Being replenished with 4  Runs of KCL as per primary team  12. Altered mental status In the setting of hypercalcemia. Urine negative for nitrites and leukocytes This is likely to improve once Calcium normalizes Monitored carefully.   13. Consider PT/OT evaluation to help patient mobilize with assistance  14. Discharging necrotic neck tumor Continue wound care and antibiotics as needed  15. Discharge planning Chemo will be completed by Monday 7/6. Plan for discharge if malignant hypercalcemia resolves by then  CODE STATUS  Full code      **Disclaimer: This note was dictated with voice recognition software. Similar sounding words can inadvertently be transcribed and this note may contain transcription errors which may not have been corrected upon publication of note.** WERTMAN,SARA E, PA-C 10/27/2013  7:25 AM

## 2013-10-28 DIAGNOSIS — C771 Secondary and unspecified malignant neoplasm of intrathoracic lymph nodes: Secondary | ICD-10-CM

## 2013-10-28 DIAGNOSIS — D649 Anemia, unspecified: Secondary | ICD-10-CM

## 2013-10-28 LAB — COMPREHENSIVE METABOLIC PANEL
ALT: 12 U/L (ref 0–53)
AST: 15 U/L (ref 0–37)
Albumin: 2.3 g/dL — ABNORMAL LOW (ref 3.5–5.2)
Alkaline Phosphatase: 50 U/L (ref 39–117)
Anion gap: 10 (ref 5–15)
BILIRUBIN TOTAL: 0.3 mg/dL (ref 0.3–1.2)
BUN: 17 mg/dL (ref 6–23)
CHLORIDE: 100 meq/L (ref 96–112)
CO2: 26 meq/L (ref 19–32)
CREATININE: 0.63 mg/dL (ref 0.50–1.35)
Calcium: 9.2 mg/dL (ref 8.4–10.5)
GFR calc Af Amer: >90 mL/min (ref >90)
Glucose, Bld: 131 mg/dL — ABNORMAL HIGH (ref 70–99)
Potassium: 3.3 mEq/L — ABNORMAL LOW (ref 3.7–5.3)
Sodium: 136 mEq/L — ABNORMAL LOW (ref 137–147)
Total Protein: 5.4 g/dL — ABNORMAL LOW (ref 6.0–8.3)

## 2013-10-28 LAB — CBC
HCT: 32.7 % — ABNORMAL LOW (ref 39.0–52.0)
Hemoglobin: 10.8 g/dL — ABNORMAL LOW (ref 13.0–17.0)
MCH: 32.3 pg (ref 26.0–34.0)
MCHC: 33 g/dL (ref 30.0–36.0)
MCV: 97.9 fL (ref 78.0–100.0)
PLATELETS: 503 10*3/uL — AB (ref 150–400)
RBC: 3.34 MIL/uL — ABNORMAL LOW (ref 4.22–5.81)
RDW: 14 % (ref 11.5–15.5)
WBC: 27.8 10*3/uL — ABNORMAL HIGH (ref 4.0–10.5)

## 2013-10-28 LAB — MAGNESIUM: Magnesium: 2.3 mg/dL (ref 1.5–2.5)

## 2013-10-28 MED ORDER — LACTULOSE 10 GM/15ML PO SOLN
10.0000 g | Freq: Two times a day (BID) | ORAL | Status: DC
  Administered 2013-10-28 – 2013-11-01 (×5): 10 g via ORAL
  Filled 2013-10-28 (×10): qty 15

## 2013-10-28 MED ORDER — POTASSIUM CHLORIDE IN NACL 20-0.9 MEQ/L-% IV SOLN
INTRAVENOUS | Status: DC
  Administered 2013-10-28 – 2013-11-01 (×11): via INTRAVENOUS
  Filled 2013-10-28 (×17): qty 1000

## 2013-10-28 NOTE — Plan of Care (Signed)
Problem: Phase I Progression Outcomes Goal: Pain controlled with appropriate interventions Outcome: Completed/Met Date Met:  10/28/13 Patient reports relief of pain after prn given.

## 2013-10-28 NOTE — Evaluation (Signed)
Occupational Therapy Evaluation Patient Details Name: Angel [name] MRN: [id number] DOB: [birth year] Today's Date: 10/28/2013    History of Present Illness Angel [name] is an 63 y.o. male with a PMH of recently diagnosed squamous cell carcinoma of the tongue who was admitted 10/25/13 with generalized weakness after suffering from a mechanical fall   Clinical Impression   Pt presents to OT with decreased I with ADL activity s/p admission to [facility] due to problems listed below. Pt will benefit from skilled OT to increase I with ADL activity and return to PLOF    Follow Up Recommendations  Home health OT    Equipment Recommendations  Tub/shower seat       Precautions / Restrictions Precautions Precautions: None      Mobility Bed Mobility Overal bed mobility: Needs Assistance Bed Mobility: Supine to Sit     Supine to sit: Min guard        Transfers Overall transfer level: Needs assistance Equipment used: 1 person hand held assist Transfers: Sit to/from Stand Sit to Stand: Min assist                   ADL Overall ADL's : Needs assistance/impaired Eating/Feeding: Set up   Grooming: Sitting;Set up   Upper Body Bathing: Minimal assitance;Sitting   Lower Body Bathing: Sit to/from stand;Moderate assistance   Upper Body Dressing : Minimal assistance   Lower Body Dressing: Moderate assistance;Sit to/from stand   Toilet Transfer: Minimal assistance   Toileting- Clothing Manipulation and Hygiene: Minimal assistance;Sit to/from stand       Functional mobility during ADLs: Minimal assistance General ADL Comments: Pt agreed to get OOB for breakfast. Pt left sittingin chair with wife having breakfast.  Upon OT entering pt very low in bed attempting to eat breakfast               Extremity/Trunk Assessment Upper Extremity Assessment Upper Extremity Assessment: Generalized weakness           Communication Communication Communication: No  difficulties;Other (comment) (speaks quietly)   Cognition Arousal/Alertness: Awake/alert Behavior During Therapy: WFL for tasks assessed/performed Overall Cognitive Status: Within Functional Limits for tasks assessed                     General Comments   wife works from home and helps pt as needed            [place] expects to be discharged to:: Private residence Living Arrangements: Spouse/significant other Available Help at Discharge: Family Type of Home: House Home Access: Stairs to enter Technical brewer of Steps: [place]: One level     Bathroom Shower/Tub: Tub/shower unit         Home Equipment: Environmental consultant - 2 wheels          Prior Functioning/Environment Level of Independence: Needs assistance        Comments: overall S at home    OT Diagnosis: Generalized weakness   OT Problem List: Decreased strength;Decreased activity tolerance   OT Treatment/Interventions: Self-care/ADL training;Patient/family education    OT Goals(Current goals can be found in the care plan section) Acute Rehab OT Goals Patient Stated Goal: pt wants to get to get back to having active days OT Goal Formulation: With patient Time For Goal Achievement: 11/11/13 Potential to Achieve Goals: Good ADL Goals Pt Will Perform Grooming: with set-up;standing Pt Will Perform Lower Body Dressing: with supervision;sit to/from stand Pt Will Transfer to Toilet: with supervision;ambulating;regular  height toilet Pt Will Perform Toileting - Clothing Manipulation and hygiene: with supervision;sit to/from stand Pt Will Perform Tub/Shower Transfer: with supervision;shower seat  OT Frequency: Min 2X/week              End of Session Nurse Communication: Mobility status  Activity Tolerance: Patient tolerated treatment well Patient left: in chair;with family/visitor present;with call bell/phone within reach   Time: 5361-4431 OT Time Calculation (min): 14  min Charges:  OT General Charges $OT Visit: 1 Procedure OT Evaluation $Initial OT Evaluation Tier I: 1 Procedure OT Treatments $Self Care/Home Management : 8-22 mins G-Codes:    [name] 11-07-13, 9:20 AM

## 2013-10-28 NOTE — Progress Notes (Signed)
ANTIBIOTIC CONSULT NOTE - Follow up  Pharmacy Consult for Zosyn Indication: necrotic neck mass and leukocytosis  No Known Allergies  Patient Measurements: Height: 5' 5.5" (166.4 cm) Weight: 103 lb 9.9 oz (47 kg) IBW/kg (Calculated) : 62.65  Vital Signs: Temp: 97.9 F (36.6 C) (07/02 0658) Temp src: Oral (07/02 0658) BP: 179/99 mmHg (07/02 0658) Pulse Rate: 87 (07/02 0658) Intake/Output from previous day: 07/01 0701 - 07/02 0700 In: 3033 [P.O.:360; I.V.:1002; IV Piggyback:1671] Out: 2925 [Urine:2925] Intake/Output from this shift:    Labs:  Recent Labs  10/25/13 0908  10/26/13 0415 10/27/13 0513 10/28/13 0515  WBC 25.5*  --   --  32.1* 27.8*  HGB 14.2  --   --  10.7* 10.8*  PLT 680*  --   --  543* 503*  CREATININE 0.80  < > 0.82 0.70 0.63  < > = values in this interval not displayed. Estimated Creatinine Clearance: 64.5 ml/min (by C-G formula based on Cr of 0.63). No results found for this basename: VANCOTROUGH, VANCOPEAK, VANCORANDOM, GENTTROUGH, GENTPEAK, GENTRANDOM, TOBRATROUGH, TOBRAPEAK, TOBRARND, AMIKACINPEAK, AMIKACINTROU, AMIKACIN,  in the last 72 hours   Microbiology: No results found for this or any previous visit (from the past 720 hour(s)).  Medical History: Past Medical History  Diagnosis Date  . Constipation due to pain medication   . Malignant neoplasm of base of tongue     left neck lymph and Left BOT cancer  . Current smoker   . 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 antiseptic oral rinse  15 mL Mouth Rinse BID  . chlorhexidine  15 mL Mouth/Throat BID  . dexamethasone  4 mg Oral Q12H  . enoxaparin (LOVENOX) injection  30 mg Subcutaneous Q24H  . feeding supplement (ENSURE COMPLETE)  237 mL Oral BID WC  . feeding supplement (ENSURE)  1 Container Oral BID BM  . FLUOROURACIL (ADRUCIL) CHEMO infusion For Inpatient Use  750 mg/m2/day (Treatment Plan  Actual) Intravenous Q24H  . piperacillin-tazobactam (ZOSYN)  IV  3.375 g Intravenous Q8H  . polyethylene glycol  17 g Oral Daily  . senna-docusate  2 tablet Oral BID   Infusions:  . sodium chloride 1,000 mL (10/28/13 0641)   Assessment: 63 yr male with recent diagnosis of squamous cell CA of tongue with complaint of weakness. Zosyn per pharmacy ordered for empiric treatment of leukocytosis and draining neck mass.  6/29 >> Zosyn >>  Tmax: AF WBCs: remain elevated. On decadron. Renal: SCr wnl, CrCl ~65 ml/min  No cultures  Goal of Therapy:  Appropriate antibiotic dosing for renal function; eradication of infection  Plan:   Zosyn 3.375g IV Q8H infused over 4hrs.  Dose adjustment not likely to be needed so pharmacy will sign-off.  Romeo Rabon, PharmD, pager [phone]. 10/28/2013,8:48 AM.

## 2013-10-28 NOTE — Evaluation (Signed)
Physical Therapy Evaluation Patient Details Name: Angel [name] MRN: [id number] DOB: 1950-09-26 Today's Date: 10/28/2013   History of Present Illness  Angel [name] is an 63 y.o. male with a PMH of recently diagnosed squamous cell carcinoma of the tongue who was admitted 10/25/13 with generalized weakness after suffering from a mechanical fall  Clinical Impression  **Pt admitted with *hypercalcemia, fall*. Pt currently with functional limitations due to the deficits listed below (see PT Problem List).  Pt will benefit from skilled PT to increase their independence and safety with mobility to allow discharge to the venue listed below.   Mr. Angel [name] walked 180' holding onto the IV pole. He has decreased activity tolerance likely due to inactivity since hospitalization. Encouraged pt to walk in halls TID with assistance. It is expected he can DC home without f/u PT.   *    Follow Up Recommendations No PT follow up    Equipment Recommendations  Cane    Recommendations for Other Services       Precautions / Restrictions Precautions Precautions: Fall Precaution Comments: pt fell prior to admission Restrictions Weight Bearing Restrictions: No      Mobility  Bed Mobility Overal bed mobility: Needs Assistance Bed Mobility: Supine to Sit     Supine to sit: Min guard        Transfers Overall transfer level: Needs assistance Equipment used: 1 person hand held assist Transfers: Sit to/from Stand Sit to Stand: Min guard         General transfer comment: used BUEs to push up on armrests of recliner, min/guard for safety due to recent fall  Ambulation/Gait Ambulation/Gait assistance: Min guard Ambulation Distance (Feet): 180 Feet Assistive device:  (single UE on IV pole) Gait Pattern/deviations: Decreased stride length   Gait velocity interpretation: at or above normal speed for age/gender General Gait Details: pt reports feeling weaker than normal due to not being up walking  for a week, walked 24' without AD, then 100' holding IV pole, no LOB with/without IV pole  Stairs            Wheelchair Mobility    Modified Rankin (Stroke Patients Only)       Balance Overall balance assessment: History of Falls;Needs assistance   Sitting balance-Leahy Scale: Normal       Standing balance-Leahy Scale: Good                               Pertinent Vitals/Pain *0/10 pain premedicated**    Home Living Family/patient expects to be discharged to:: Private residence Living Arrangements: Spouse/significant other Available Help at Discharge: Family;Available 24 hours/day Type of Home: House Home Access: Stairs to enter   [employer] of Steps: 3 Home Layout: One level Home Equipment: Walker - 2 wheels      Prior Function Level of Independence: Needs assistance   Gait / Transfers Assistance Needed: independent with walking     Comments: overall S at home     Hand Dominance        Extremity/Trunk Assessment   Upper Extremity Assessment: Generalized weakness           Lower Extremity Assessment: Overall WFL for tasks assessed      Cervical / Trunk Assessment:  (large L neck tumor limits cervical AROM 75%)  Communication   Communication: No difficulties;Other (comment) (speaks quietly)  Cognition Arousal/Alertness: Awake/alert Behavior During Therapy: WFL for tasks assessed/performed Overall Cognitive Status: Within  Functional Limits for tasks assessed                      General Comments      Exercises        Assessment/Plan    PT Assessment Patient needs continued PT services  PT Diagnosis Difficulty walking   PT Problem List Decreased activity tolerance;Decreased balance;Decreased mobility  PT Treatment Interventions Gait training;Functional mobility training;Therapeutic exercise   PT Goals (Current goals can be found in the Care Plan section) Acute Rehab PT Goals Patient Stated Goal:  pt wants to get to get back to having active days PT Goal Formulation: With patient/family Time For Goal Achievement: 11/11/13 Potential to Achieve Goals: Good    Frequency Min 3X/week   Barriers to discharge        Co-evaluation               End of Session Equipment Utilized During Treatment: Gait belt Activity Tolerance: Patient tolerated treatment well Patient left: in chair;with call bell/phone within reach;with family/visitor present Nurse Communication: Mobility status         Time: 1245-8099 PT Time Calculation (min): 15 min   Charges:   PT Evaluation $Initial PT Evaluation Tier I: 1 Procedure PT Treatments $Gait Training: 8-22 mins   PT G Codes:          Philomena Doheny 10/28/2013, 10:41 AM [phone]

## 2013-10-28 NOTE — Progress Notes (Signed)
Progress Note   Angel [name] IRC:[id number] DOB: 1950-06-28 DOA: 10/25/2013 PCP: [name], MD   Brief Narrative:   Angel [name] is an 63 y.o. 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 Upon initial evaluation in the ED, his calcium levels found to be 15. Chemotherapy with Taxotere, cis-platinum, and 5-FU initiated.  Assessment/Plan:   Principal Problem:   Hypercalcemia of malignancy with generalized weakness/fall / altered mental status  Status post IV Decadron and pamidronate. He is also received IV fluids/Lasix.  Continue IV fluids.  Calcium level normalized 10/28/13.  Continue PT/OT.  Active Problems:   Cancer of base of tongue with regional lymph node metastasis/neck pain  Induction chemotherapy with Taxotere, cis-platinum, and 5-FU started per oncologist.  Radiation therapy will be initiated when cleared by dentistry/oncology.    Tobacco abuse  Counseled. Refuses nicotine patch.    Leukocytosis, unspecified  Thought to be from tumor necrosis and affects of dexamethasone.  Continue empiric Zosyn.    Chronic constipation  Secondary to hypercalcemia and medication side effects.  Continue aggressive bowel regimen.    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 Transfuse for hemoglobin less than 7.    Thrombocytosis  Likely reactive secondary to infected necrotic tumor.    Hypokalemia  Add potassium to IV fluids. Magnesium WNL.    Protein-calorie malnutrition, severe / weight loss / dysphagia  Continue Ensure nutritional supplements.    DVT Prophylaxis  Continue SCDs and Lovenox.  Code Status: Full. Family Communication: Wife at bedside. Disposition Plan: Home when chemotherapy completed, likely 11/01/13.   IV Access:    PICC line   Procedures:    Ultrasound/fluoroscopic guided right DL brachial  vein PICC line placed 10/26/13  Radiographic procedures as noted below.   Medical Consultants:    Dr. [name], Oncology   Other Consultants:    Dietician   Anti-Infectives:    Zosyn 10/25/13--->   Subjective:   Angel [name]  denies current nausea, vomiting, or diarrhea. He has intermittent neck pain but reports that his pain is well-controlled with his current medication regimen. He has had some fatigue with activity.  Objective:    Filed Vitals:   10/27/13 1500 10/27/13 2020 10/27/13 2150 10/28/13 0658  BP: 157/99  178/98 179/99  Pulse: 87 90 79 87  Temp: 97.8 F (36.6 C)  97.9 F (36.6 C) 97.9 F (36.6 C)  TempSrc: Oral  Oral Oral  Resp: 16  16 16   Height:      Weight:      SpO2: 99%  100% 100%    Intake/Output Summary (Last 24 hours) at 10/28/13 1533 Last data filed at 10/28/13 1400  Gross per 24 hour  Intake 3486.76 ml  Output   2350 ml  Net 1136.76 ml    Exam: Gen:  NAD Neck: Large left neck mass Cardiovascular:  RRR, No M/R/G Respiratory:  Lungs CTAB Gastrointestinal:  Abdomen soft, NT/ND, + BS Extremities:  No C/E/C   Data Reviewed:    Labs: Basic Metabolic Panel:  Recent Labs Lab 10/25/13 0908 10/25/13 1529 10/26/13 0415 10/27/13 0513 10/28/13 0515  NA 138 138 138 140 136*  K 3.1* 3.1* 3.3* 3.1* 3.3*  CL 90* 97 96 103 100  CO2 34* 31 32 28 26  GLUCOSE 125* 113* 138* 136* 131*  BUN 16 13 15 17 17   CREATININE 0.80 0.70  0.82 0.70 0.63  CALCIUM >15.0* 13.4* 13.0* 11.4* 9.2  MG  --   --  1.9  --  2.3   GFR Estimated Creatinine Clearance: 64.5 ml/min (by C-G formula based on Cr of 0.63). Liver Function Tests:  Recent Labs Lab 10/25/13 0908 10/26/13 0415 10/28/13 0515  AST 13 11 15   ALT 10 8 12   ALKPHOS 74 63 50  BILITOT 0.4 0.3 0.3  PROT 7.8 6.4 5.4*  ALBUMIN 3.5 2.8* 2.3*   CBC:  Recent Labs Lab 10/25/13 0908 10/27/13 0513 10/28/13 0515  WBC 25.5* 32.1* 27.8*  NEUTROABS 20.1*  --   --   HGB 14.2 10.7*  10.8*  HCT 43.0 31.7* 32.7*  MCV 98.2 97.5 97.9  PLT 680* 543* 503*   Sepsis Labs:  Recent Labs Lab 10/25/13 0908 10/25/13 1119 10/27/13 0513 10/28/13 0515  WBC 25.5*  --  32.1* 27.8*  LATICACIDVEN  --  1.16  --   --    Microbiology No results found for this or any previous visit (from the past 240 hour(s)).   Radiographs/Studies:   Nm Pet Image Initial (pi) Skull Base To Thigh  10/15/2013   CLINICAL DATA:  Initial treatment strategy for tongue cancer.  EXAM: NUCLEAR MEDICINE PET SKULL BASE TO THIGH  TECHNIQUE: 6.4 mCi F-18 FDG was injected intravenously. Full-ring PET imaging was performed from the skull base to thigh after the radiotracer. CT data was obtained and used for attenuation correction and anatomic localization.  FASTING BLOOD GLUCOSE:  Value: 147 mg/dl  COMPARISON:  None.  FINDINGS: NECK  Large base of tongue mass measures 4.7 cm and has an SUV max equal 14.6. Large, necrotic lymph node within the left side of neck measures 8.1 x 7.8 cm and has an SUV max equal to 14.5. Hypermetabolic right level 2 lymph node measures 2.3 cm and has an SUV max equal to 10. There is a small right-sided level 4 lymph node which measures 8 mm and has an SUV max equal to 2.8.  CHEST  No hypermetabolic mediastinal or hilar nodes. No suspicious pulmonary nodules on the CT scan. Clustered nodules within the posterior right upper lobe are identified, image 23/series 7. There is associated cylindrical type bronchiectasis. Mild changes of centrilobular emphysema noted. The heart size is normal. No pericardial effusion. There is mild calcified atherosclerotic disease involving the thoracic aorta. Calcification involving the LAD coronary artery noted.  ABDOMEN/PELVIS  No abnormal hypermetabolic activity within the liver, pancreas, adrenal glands, or spleen. No hypermetabolic lymph nodes in the abdomen or pelvis.  SKELETON  No focal hypermetabolic activity to suggest skeletal metastasis.  IMPRESSION: 1.  Examination is positive for large, hypermetabolic base of tongue mass. 2. Bilateral cervical lymph node metastasis. 3. No evidence for hypermetabolic metastasis to the chest abdomen or pelvis. 4. Postinflammatory/infectious changes within the posterior right upper lobe. 5. Atherosclerotic disease including multi vessel coronary artery calcifications.   Electronically Signed   By: Kerby Moors M.D.   On: 10/15/2013 09:53   Ir Fluoro Guide Cv Line Right  10/26/2013   CLINICAL DATA:  History of squamous cell cancer of the oropharynx; central venous access is requested for chemotherapy.  EXAM: IR RIGHT FLOURO GUIDE CV LINE; IR ULTRASOUND GUIDANCE VASC ACCESS RIGHT  FLUOROSCOPY TIME:  24 seconds  TECHNIQUE: The right arm was prepped with chlorhexidine, draped in the usual sterile fashion using maximum barrier technique (cap and mask, sterile gown, sterile gloves, large sterile sheet, hand hygiene and cutaneous antiseptic). Local anesthesia  was attained by infiltration with 1% lidocaine.  Ultrasound demonstrated patency of the right brachial vein, and this was documented with an image. Under real-time ultrasound guidance, this vein was accessed with a 21 gauge micropuncture needle and image documentation was performed. The needle was exchanged over a guidewire for a peel-away sheath through which a 39 cm 5 [place] double lumen power injectable PICC was advanced, and positioned with its tip at the lower SVC/right atrial junction. Fluoroscopy during the procedure and fluoro spot radiograph confirms appropriate catheter position. The catheter was flushed, secured to the skin with Prolene sutures, and covered with a sterile dressing.  COMPLICATIONS: None.  The patient tolerated the procedure well.  IMPRESSION: Successful placement of a right arm PICC with sonographic and fluoroscopic guidance. The catheter is ready for use.  Read by: Rowe Robert ,P.A.-C.   Electronically Signed   By: Aletta Edouard M.D.   On: 10/26/2013  14:14   Ir US Guide Vasc Access Right  10/26/2013   CLINICAL DATA:  History of squamous cell cancer of the oropharynx; central venous access is requested for chemotherapy.  EXAM: IR RIGHT FLOURO GUIDE CV LINE; IR ULTRASOUND GUIDANCE VASC ACCESS RIGHT  FLUOROSCOPY TIME:  24 seconds  TECHNIQUE: The right arm was prepped with chlorhexidine, draped in the usual sterile fashion using maximum barrier technique (cap and mask, sterile gown, sterile gloves, large sterile sheet, hand hygiene and cutaneous antiseptic). Local anesthesia was attained by infiltration with 1% lidocaine.  Ultrasound demonstrated patency of the right brachial vein, and this was documented with an image. Under real-time ultrasound guidance, this vein was accessed with a 21 gauge micropuncture needle and image documentation was performed. The needle was exchanged over a guidewire for a peel-away sheath through which a 39 cm 5 [place] double lumen power injectable PICC was advanced, and positioned with its tip at the lower SVC/right atrial junction. Fluoroscopy during the procedure and fluoro spot radiograph confirms appropriate catheter position. The catheter was flushed, secured to the skin with Prolene sutures, and covered with a sterile dressing.  COMPLICATIONS: None.  The patient tolerated the procedure well.  IMPRESSION: Successful placement of a right arm PICC with sonographic and fluoroscopic guidance. The catheter is ready for use.  Read by: Rowe Robert ,P.A.-C.   Electronically Signed   By: Aletta Edouard M.D.   On: 10/26/2013 14:14    Medications:   . antiseptic oral rinse  15 mL Mouth Rinse BID  . chlorhexidine  15 mL Mouth/Throat BID  . dexamethasone  4 mg Oral Q12H  . enoxaparin (LOVENOX) injection  30 mg Subcutaneous Q24H  . feeding supplement (ENSURE COMPLETE)  237 mL Oral BID WC  . feeding supplement (ENSURE)  1 Container Oral BID BM  . FLUOROURACIL (ADRUCIL) CHEMO infusion For Inpatient Use  750 mg/m2/day (Treatment  Plan Actual) Intravenous Q24H  . lactulose  10 g Oral BID  . piperacillin-tazobactam (ZOSYN)  IV  3.375 g Intravenous Q8H  . polyethylene glycol  17 g Oral Daily  . senna-docusate  2 tablet Oral BID   Continuous Infusions: . 0.9 % NaCl with KCl 20 mEq / L 150 mL/hr at 10/28/13 1159    Time spent: 25 minutes.    LOS: 3 days   [facility] Pager [phone]. If unable to reach me by pager, please call my cell phone at [phone].  *Please refer to [email or web address], password [email or web address] to get updated schedule on who will round on this patient, as hospitalists switch teams  weekly. If 7PM-7AM, please contact night-coverage at [email or web address], password [email or web address] for any overnight needs.  10/28/2013, 3:33 PM    **Disclaimer: This note was dictated with voice recognition software. Similar sounding words can inadvertently be transcribed and this note may contain transcription errors which may not have been corrected upon publication of note.**

## 2013-10-28 NOTE — Progress Notes (Signed)
Given MOM prn. LBM was prior to admission.

## 2013-10-28 NOTE — Plan of Care (Signed)
Problem: Phase II Progression Outcomes Goal: Progress activity as tolerated unless otherwise ordered Outcome: Progressing oob with assist

## 2013-10-28 NOTE — Plan of Care (Signed)
Problem: Phase II Progression Outcomes Goal: Nausea and vomiting controlled Outcome: Progressing No nausea complains during the night

## 2013-10-28 NOTE — Plan of Care (Signed)
Problem: Phase II Progression Outcomes Goal: Tolerating diet Outcome: Progressing Patient with difficulty taking pm medications last pm. Took senekot s whole in chocolate pudding followed by MOM then patient c/o meds feeling "stuck in throat" . Had to sit on side of bed for about 20-25 minutes taking few sips of liquid and being patted on back til finally feeling like they went down.

## 2013-10-28 NOTE — Progress Notes (Signed)
Angel [name]   DOB:[birth year]   KY#:[id number]   BTD#:[id number]  I have seen the patient, examined him and edited the notes as follows  Subjective: Patient seen and examined. No new events overnight. Tolerating chemotherapy to date. His confusion is resolved. Denies headaches or vision changes. Appetite is improving, tolerates soft liquid foods such as pudding. No bowel movement to date despite laxatives given on prior day (although nursing staff was able to find documentation of recent BM on 6/29). He states that he "feels that he is going to have one today".  Pain controlled with current regimen. No further palpitations. Denies shortness of breath. Denies nausea or vomiting.  Scheduled Meds: . antiseptic oral rinse  15 mL Mouth Rinse BID  . chlorhexidine  15 mL Mouth/Throat BID  . dexamethasone  4 mg Oral Q12H  . enoxaparin (LOVENOX) injection  30 mg Subcutaneous Q24H  . feeding supplement (ENSURE COMPLETE)  237 mL Oral BID WC  . feeding supplement (ENSURE)  1 Container Oral BID BM  . FLUOROURACIL (ADRUCIL) CHEMO infusion For Inpatient Use  750 mg/m2/day (Treatment Plan Actual) Intravenous Q24H  . piperacillin-tazobactam (ZOSYN)  IV  3.375 g Intravenous Q8H  . polyethylene glycol  17 g Oral Daily  . senna-docusate  2 tablet Oral BID   Continuous Infusions: . sodium chloride 1,000 mL (10/28/13 0641)   PRN Meds:.acetaminophen, magnesium hydroxide, morphine, morphine injection, ondansetron (ZOFRAN) IV, ondansetron, promethazine, sodium chloride, sodium chloride   Objective:  Filed Vitals:   10/27/13 2150  BP: 178/98  Pulse: 79  Temp: 97.9 F (36.6 C)  Resp: 16      Intake/Output Summary (Last 24 hours) at 10/28/13 0718 Last data filed at 10/28/13 0252  Gross per 24 hour  Intake   3033 ml  Output   2000 ml  Net   1033 ml    ECOG PERFORMANCE STATUS: 1-2   GENERAL:alert,oriented x 3, conversant. SKIN: skin color, texture, turgor are normal, no rashes or significant lesions.  Noted discoloration around his neck wound.  EYES: normal, conjunctiva are pink and non-injected, sclera clear  OROPHARYNX:no exudate, no erythema and lips, buccal mucosa, and tongue normal . Poor dentition is noted  NECK: a large left-sided neck mass with wound draining.  LYMPH: Bilateral cervical lymphadenopathy. No lymphadenopathy elsewhere.  LUNGS: clear to auscultation and percussion with normal breathing effort  HEART: regular rate & rhythm and no murmurs and no lower extremity edema  ABDOMEN:abdomen soft, non-tender and normal bowel sounds  Musculoskeletal:no cyanosis of digits and no clubbing  PSYCH: alert & oriented, follows commands appropriately. Moving all extremities. NEURO: no focal motor/sensory deficits    CBG (last 3)  No results found for this basename: GLUCAP,  in the last 72 hours   Labs:   Recent Labs Lab 10/25/13 0908 10/27/13 0513  WBC 25.5* 32.1*  HGB 14.2 10.7*  HCT 43.0 31.7*  PLT 680* 543*  MCV 98.2 97.5  MCH 32.4 32.9  MCHC 33.0 33.8  RDW 14.1 14.0  LYMPHSABS 2.3  --   MONOABS 2.3*  --   EOSABS 0.8*  --   BASOSABS 0.0  --      Chemistries:    Recent Labs Lab 10/25/13 0908 10/25/13 1529 10/26/13 0415 10/27/13 0513  NA 138 138 138 140  K 3.1* 3.1* 3.3* 3.1*  CL 90* 97 96 103  CO2 34* 31 32 28  GLUCOSE 125* 113* 138* 136*  BUN 16 13 15 17   CREATININE 0.80 0.70 0.82 0.70  CALCIUM >15.0* 13.4* 13.0* 11.4*  MG  --   --  1.9  --   AST 13  --  11  --   ALT 10  --  8  --   ALKPHOS 74  --  63  --   BILITOT 0.4  --  0.3  --     GFR Estimated Creatinine Clearance: 64.5 ml/min (by C-G formula based on Cr of 0.7).  Liver Function Tests:  Recent Labs Lab 10/25/13 0908 10/26/13 0415  AST 13 11  ALT 10 8  ALKPHOS 74 63  BILITOT 0.4 0.3  PROT 7.8 6.4  ALBUMIN 3.5 2.8*     Assessment/Plan: 63 y.o.   1. Cancer of base of tongue with regional lymph node metastasis  PET CT scan showed no evidence of distant metastatic disease.  He is a good candidate for induction chemotherapy followed by radiation treatment, with intent to cure.  IV team performed PICC line placement on 6/30 All risks and benefits of chemotherapy with  5FU/Docetaxel and cisplatin were discussed and patient signed consent form.  D1C1 started on 7/1, tolerating chemo well.  2. Malignant hypercalcemia  Due to untreated squamous cell carcinoma of the base of tongue.   IV Decadron, IV pamidronate, IV fluids and Lasix were initiated on 6/29. Repeat calcium level is improving today to 11.4.  Continue on aggressive IV fluids at current rate for now.   IV Decadron discontinued, now on oral decadron 4 mg q 12 hrs for 2 more days, then stop  3.Tobacco abuse  Counseling was provided, but he refuses nicotine patch at this time  4.Leukocytosis  Likely due to infected necrotic tumor and dexamethasone.  He is afebrile. Labs pending On  IV antibiotics with Zosyn day 3.   5.Thrombocytosis  Likely due to infected necrotic tumor.  On day 3 of IV Zosyn and IV fluids Improving, labs this morning are pending. Continue to monitor  6. Anemia Likely due to malignancy,infection, serosanguineous drainage from left neck mass, hydration, blood draws, antibiotics and malnutrition.  Monitor carefully, labs this morning pending. No transfusion indicated at this time.   7.Neck pain  Secondary to cancer Continue pain medicine as needed. Current regimen adequate  8.DVT prophylaxis  on Lovenox and SCDs  9.Chronic constipation  Exacerbated by malignant hypercalcemia, decreased mobility, pain medications and dehydration. No bowel movement since admission. On Miralax daily (last dose 7/1),Senokot bid and Milk of Magnesium with stool softeners.   Lactulose added on 7/1; patient reports he may resolve this issue this morning as he is feeling he is to evacuate bowels.  10. Severe Malnutrition due to large left neck mass, dysphagia On Ensure bid as per Nutritionist  recommendation.   11.Hypokalemia In the setting of diuretics Received 4  Runs of KCL as per primary team on 7/1.   12. Altered mental status In the setting of hypercalcemia. Urine negative for nitrites and leukocytes Resolved.  13. Consider PT/OT evaluation to help patient mobilize with assistance  14. Discharging necrotic neck tumor Continue wound care and antibiotics as needed  15. Discharge planning Chemo will be completed by Monday 7/6. Plan for discharge if malignant hypercalcemia resolves by then  CODE STATUS  Full code      **Disclaimer: This note was dictated with voice recognition software. Similar sounding words can inadvertently be transcribed and this note may contain transcription errors which may not have been corrected upon publication of note.Sharene Butters E, PA-C 10/28/2013  7:18 AM Bryce Kimble, MD 10/28/2013

## 2013-10-29 LAB — BASIC METABOLIC PANEL
Anion gap: 9 (ref 5–15)
BUN: 14 mg/dL (ref 6–23)
CALCIUM: 8.1 mg/dL — AB (ref 8.4–10.5)
CO2: 27 mEq/L (ref 19–32)
Chloride: 100 mEq/L (ref 96–112)
Creatinine, Ser: 0.61 mg/dL (ref 0.50–1.35)
Glucose, Bld: 119 mg/dL — ABNORMAL HIGH (ref 70–99)
Potassium: 3.8 mEq/L (ref 3.7–5.3)
Sodium: 136 mEq/L — ABNORMAL LOW (ref 137–147)

## 2013-10-29 LAB — CBC
HCT: 32.3 % — ABNORMAL LOW (ref 39.0–52.0)
Hemoglobin: 11.1 g/dL — ABNORMAL LOW (ref 13.0–17.0)
MCH: 33 pg (ref 26.0–34.0)
MCHC: 34.4 g/dL (ref 30.0–36.0)
MCV: 96.1 fL (ref 78.0–100.0)
PLATELETS: 476 10*3/uL — AB (ref 150–400)
RBC: 3.36 MIL/uL — ABNORMAL LOW (ref 4.22–5.81)
RDW: 14.1 % (ref 11.5–15.5)
WBC: 34.1 10*3/uL — ABNORMAL HIGH (ref 4.0–10.5)

## 2013-10-29 MED ORDER — PANTOPRAZOLE SODIUM 40 MG IV SOLR
40.0000 mg | INTRAVENOUS | Status: DC
  Administered 2013-10-29 – 2013-10-31 (×3): 40 mg via INTRAVENOUS
  Filled 2013-10-29 (×4): qty 40

## 2013-10-29 MED ORDER — ALUM & MAG HYDROXIDE-SIMETH 200-200-20 MG/5ML PO SUSP
30.0000 mL | ORAL | Status: DC | PRN
  Administered 2013-10-29 – 2013-10-31 (×4): 30 mL via ORAL
  Filled 2013-10-29 (×4): qty 30

## 2013-10-29 NOTE — Progress Notes (Signed)
OT Cancellation Note  Patient Details Name: Angel [name] MRN: [id number] DOB: 31-May-1950   Cancelled Treatment:    Reason Eval/Treat Not Completed: Other (comment).  Attempted tx:  Pt just back to bed when I checked this am and now lunch as just arrived.  Will return over the weekend or Monday.  [name] 10/29/2013, 1:30 PM Lesle Chris, OTR/L [phone] 10/29/2013

## 2013-10-29 NOTE — Progress Notes (Signed)
Progress Note   DUGLAS HEIER ASN:[id number] DOB: [birth year] DOA: 10/25/2013 PCP: [name], MD   Brief Narrative:   Angel [name] is an 63 y.o. 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 Upon initial evaluation in the ED, his calcium levels found to be 15. Chemotherapy with Taxotere, cis-platinum, and 5-FU initiated.  Assessment/Plan:   Principal Problem:   Hypercalcemia of malignancy with generalized weakness/fall / altered mental status  Status post IV Decadron and pamidronate. He is also received IV fluids/Lasix.  Continue IV fluids.  Calcium level normalized 10/28/13.  Continue PT/OT.  Active Problems:   Cancer of base of tongue with regional lymph node metastasis/neck pain  Induction chemotherapy with Taxotere, cis-platinum, and 5-FU started per oncologist.  Radiation therapy will be initiated when cleared by dentistry/oncology.    Tobacco abuse  Counseled. Refuses nicotine patch.    Leukocytosis, unspecified  Thought to be from tumor necrosis and affects of dexamethasone.  Continue empiric Zosyn.    Chronic constipation  Secondary to hypercalcemia and medication side effects.  Continue aggressive bowel regimen.    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 Transfuse for hemoglobin less than 7.    Thrombocytosis  Likely reactive secondary to infected necrotic tumor.    Hypokalemia  Replaced. Magnesium WNL.    Protein-calorie malnutrition, severe / weight loss / dysphagia  Continue Ensure nutritional supplements.    DVT Prophylaxis  Continue SCDs and Lovenox.  Code Status: Full. Family Communication: Wife at bedside. Disposition Plan: Home when chemotherapy completed, likely 11/01/13.   IV Access:    PICC line   Procedures:    Ultrasound/fluoroscopic guided right DL brachial vein PICC line  placed 10/26/13  Radiographic procedures as noted below.   Medical Consultants:    Dr. [name], Oncology   Other Consultants:    Dietician   Anti-Infectives:    Zosyn 10/25/13--->   Subjective:   Angel [name] has had some nausea and because of this, is trying to minimize his use of pain medications. No vomiting. Bowels are moving. Appetite is fair.  Objective:    Filed Vitals:   10/28/13 0658 10/28/13 1551 10/28/13 2134 10/29/13 0509  BP: 179/99 168/96 161/98 155/84  Pulse: 87 88 81 92  Temp: 97.9 F (36.6 C) 98.2 F (36.8 C) 98 F (36.7 C) 97.6 F (36.4 C)  TempSrc: Oral Oral Oral Oral  Resp: 16 16 16 16   Height:      Weight:      SpO2: 100% 100% 100% 99%    Intake/Output Summary (Last 24 hours) at 10/29/13 0746 Last data filed at 10/29/13 0310  Gross per 24 hour  Intake   1779 ml  Output      0 ml  Net   1779 ml    Exam: Gen:  NAD Neck: Large left neck mass Cardiovascular:  RRR, No M/R/G Respiratory:  Lungs CTAB Gastrointestinal:  Abdomen soft, NT/ND, + BS Extremities:  No C/E/C   Data Reviewed:    Labs: Basic Metabolic Panel:  Recent Labs Lab 10/25/13 1529 10/26/13 0415 10/27/13 0513 10/28/13 0515 10/29/13 0319  NA 138 138 140 136* 136*  K 3.1* 3.3* 3.1* 3.3* 3.8  CL 97 96 103 100 100  CO2 31 32 28 26 27   GLUCOSE 113* 138* 136* 131* 119*  BUN 13 15 17 17 14   CREATININE 0.70 0.82  0.70 0.63 0.61  CALCIUM 13.4* 13.0* 11.4* 9.2 8.1*  MG  --  1.9  --  2.3  --    GFR Estimated Creatinine Clearance: 64.5 ml/min (by C-G formula based on Cr of 0.61). Liver Function Tests:  Recent Labs Lab 10/25/13 0908 10/26/13 0415 10/28/13 0515  AST 13 11 15   ALT 10 8 12   ALKPHOS 74 63 50  BILITOT 0.4 0.3 0.3  PROT 7.8 6.4 5.4*  ALBUMIN 3.5 2.8* 2.3*   CBC:  Recent Labs Lab 10/25/13 0908 10/27/13 0513 10/28/13 0515 10/29/13 0319  WBC 25.5* 32.1* 27.8* 34.1*  NEUTROABS 20.1*  --   --   --   HGB 14.2 10.7* 10.8* 11.1*  HCT  43.0 31.7* 32.7* 32.3*  MCV 98.2 97.5 97.9 96.1  PLT 680* 543* 503* 476*   Sepsis Labs:  Recent Labs Lab 10/25/13 0908 10/25/13 1119 10/27/13 0513 10/28/13 0515 10/29/13 0319  WBC 25.5*  --  32.1* 27.8* 34.1*  LATICACIDVEN  --  1.16  --   --   --    Microbiology No results found for this or any previous visit (from the past 240 hour(s)).   Radiographs/Studies:   Nm Pet Image Initial (pi) Skull Base To Thigh  10/15/2013   CLINICAL DATA:  Initial treatment strategy for tongue cancer.  EXAM: NUCLEAR MEDICINE PET SKULL BASE TO THIGH  TECHNIQUE: 6.4 mCi F-18 FDG was injected intravenously. Full-ring PET imaging was performed from the skull base to thigh after the radiotracer. CT data was obtained and used for attenuation correction and anatomic localization.  FASTING BLOOD GLUCOSE:  Value: 147 mg/dl  COMPARISON:  None.  FINDINGS: NECK  Large base of tongue mass measures 4.7 cm and has an SUV max equal 14.6. Large, necrotic lymph node within the left side of neck measures 8.1 x 7.8 cm and has an SUV max equal to 14.5. Hypermetabolic right level 2 lymph node measures 2.3 cm and has an SUV max equal to 10. There is a small right-sided level 4 lymph node which measures 8 mm and has an SUV max equal to 2.8.  CHEST  No hypermetabolic mediastinal or hilar nodes. No suspicious pulmonary nodules on the CT scan. Clustered nodules within the posterior right upper lobe are identified, image 23/series 7. There is associated cylindrical type bronchiectasis. Mild changes of centrilobular emphysema noted. The heart size is normal. No pericardial effusion. There is mild calcified atherosclerotic disease involving the thoracic aorta. Calcification involving the LAD coronary artery noted.  ABDOMEN/PELVIS  No abnormal hypermetabolic activity within the liver, pancreas, adrenal glands, or spleen. No hypermetabolic lymph nodes in the abdomen or pelvis.  SKELETON  No focal hypermetabolic activity to suggest skeletal  metastasis.  IMPRESSION: 1. Examination is positive for large, hypermetabolic base of tongue mass. 2. Bilateral cervical lymph node metastasis. 3. No evidence for hypermetabolic metastasis to the chest abdomen or pelvis. 4. Postinflammatory/infectious changes within the posterior right upper lobe. 5. Atherosclerotic disease including multi vessel coronary artery calcifications.   Electronically Signed   By: Kerby Moors M.D.   On: 10/15/2013 09:53   Ir Fluoro Guide Cv Line Right  10/26/2013   CLINICAL DATA:  History of squamous cell cancer of the oropharynx; central venous access is requested for chemotherapy.  EXAM: IR RIGHT FLOURO GUIDE CV LINE; IR ULTRASOUND GUIDANCE VASC ACCESS RIGHT  FLUOROSCOPY TIME:  24 seconds  TECHNIQUE: The right arm was prepped with chlorhexidine, draped in the usual sterile fashion using maximum barrier technique (cap  and mask, sterile gown, sterile gloves, large sterile sheet, hand hygiene and cutaneous antiseptic). Local anesthesia was attained by infiltration with 1% lidocaine.  Ultrasound demonstrated patency of the right brachial vein, and this was documented with an image. Under real-time ultrasound guidance, this vein was accessed with a 21 gauge micropuncture needle and image documentation was performed. The needle was exchanged over a guidewire for a peel-away sheath through which a 39 cm 5 [place] double lumen power injectable PICC was advanced, and positioned with its tip at the lower SVC/right atrial junction. Fluoroscopy during the procedure and fluoro spot radiograph confirms appropriate catheter position. The catheter was flushed, secured to the skin with Prolene sutures, and covered with a sterile dressing.  COMPLICATIONS: None.  The patient tolerated the procedure well.  IMPRESSION: Successful placement of a right arm PICC with sonographic and fluoroscopic guidance. The catheter is ready for use.  Read by: Rowe Robert ,P.A.-C.   Electronically Signed   By: Aletta Edouard M.D.   On: 10/26/2013 14:14   Ir US Guide Vasc Access Right  10/26/2013   CLINICAL DATA:  History of squamous cell cancer of the oropharynx; central venous access is requested for chemotherapy.  EXAM: IR RIGHT FLOURO GUIDE CV LINE; IR ULTRASOUND GUIDANCE VASC ACCESS RIGHT  FLUOROSCOPY TIME:  24 seconds  TECHNIQUE: The right arm was prepped with chlorhexidine, draped in the usual sterile fashion using maximum barrier technique (cap and mask, sterile gown, sterile gloves, large sterile sheet, hand hygiene and cutaneous antiseptic). Local anesthesia was attained by infiltration with 1% lidocaine.  Ultrasound demonstrated patency of the right brachial vein, and this was documented with an image. Under real-time ultrasound guidance, this vein was accessed with a 21 gauge micropuncture needle and image documentation was performed. The needle was exchanged over a guidewire for a peel-away sheath through which a 39 cm 5 [place] double lumen power injectable PICC was advanced, and positioned with its tip at the lower SVC/right atrial junction. Fluoroscopy during the procedure and fluoro spot radiograph confirms appropriate catheter position. The catheter was flushed, secured to the skin with Prolene sutures, and covered with a sterile dressing.  COMPLICATIONS: None.  The patient tolerated the procedure well.  IMPRESSION: Successful placement of a right arm PICC with sonographic and fluoroscopic guidance. The catheter is ready for use.  Read by: Rowe Robert ,P.A.-C.   Electronically Signed   By: Aletta Edouard M.D.   On: 10/26/2013 14:14    Medications:   . antiseptic oral rinse  15 mL Mouth Rinse BID  . chlorhexidine  15 mL Mouth/Throat BID  . dexamethasone  4 mg Oral Q12H  . enoxaparin (LOVENOX) injection  30 mg Subcutaneous Q24H  . feeding supplement (ENSURE COMPLETE)  237 mL Oral BID WC  . feeding supplement (ENSURE)  1 Container Oral BID BM  . FLUOROURACIL (ADRUCIL) CHEMO infusion For Inpatient  Use  750 mg/m2/day (Treatment Plan Actual) Intravenous Q24H  . lactulose  10 g Oral BID  . piperacillin-tazobactam (ZOSYN)  IV  3.375 g Intravenous Q8H  . polyethylene glycol  17 g Oral Daily  . senna-docusate  2 tablet Oral BID   Continuous Infusions: . 0.9 % NaCl with KCl 20 mEq / L 150 mL/hr at 10/29/13 0732    Time spent: 25 minutes.    LOS: 4 days   [facility] Pager [phone]. If unable to reach me by pager, please call my cell phone at [phone].  *Please refer to [email or web address], password  [email or web address] to get updated schedule on who will round on this patient, as hospitalists switch teams weekly. If 7PM-7AM, please contact night-coverage at [email or web address], password [email or web address] for any overnight needs.  10/29/2013, 7:46 AM    **Disclaimer: This note was dictated with voice recognition software. Similar sounding words can inadvertently be transcribed and this note may contain transcription errors which may not have been corrected upon publication of note.**

## 2013-10-29 NOTE — Progress Notes (Signed)
Physical Therapy Treatment Patient Details Name: Angel [name] MRN: [id number] DOB: [birth year] Today's Date: 10/29/2013    History of Present Illness Angel [name] is an 63 y.o. 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   Follow Up Recommendations  No PT follow up     Equipment Recommendations  Cane    Recommendations for Other Services       Precautions / Restrictions Precautions Precautions: Fall    Mobility  Bed Mobility Overal bed mobility: Modified Independent                Transfers Overall transfer level: Needs assistance   Transfers: Sit to/from Stand Sit to Stand: Supervision         General transfer comment: pt required no assistance.  Ambulation/Gait Ambulation/Gait assistance: Min guard Ambulation Distance (Feet): 400 Feet Assistive device:  (IV pole)       General Gait Details: pt ambulated much steadier today.   Stairs            Wheelchair Mobility    Modified Rankin (Stroke Patients Only)       Balance           Standing balance support: No upper extremity supported Standing balance-Leahy Scale: Good                      Cognition Arousal/Alertness: Awake/alert Behavior During Therapy: WFL for tasks assessed/performed                        Exercises      General Comments        Pertinent Vitals/Pain none    Home Living                      Prior Function            PT Goals (current goals can now be found in the care plan section) Progress towards PT goals: Progressing toward goals    Frequency  Min 3X/week    PT Plan Current plan remains appropriate    Co-evaluation             End of Session   Activity Tolerance: Patient tolerated treatment well Patient left: in bed;with call  bell/phone within reach;with family/visitor present     Time: 1191-4782 PT Time Calculation (min): 11 min  Charges:  $Gait Training: 8-22 mins                    G Codes:      Angel [name] 10/29/2013, 12:02 PM

## 2013-10-29 NOTE — Progress Notes (Signed)
Patient with c/o indigestion.  Dr. Rockne Menghini notified and orders placed.  Zandra Abts [facility]  10/29/2013  4:53 PM

## 2013-10-30 DIAGNOSIS — M6281 Muscle weakness (generalized): Secondary | ICD-10-CM

## 2013-10-30 DIAGNOSIS — C770 Secondary and unspecified malignant neoplasm of lymph nodes of head, face and neck: Secondary | ICD-10-CM

## 2013-10-30 LAB — BASIC METABOLIC PANEL
ANION GAP: 9 (ref 5–15)
BUN: 11 mg/dL (ref 6–23)
CALCIUM: 7.7 mg/dL — AB (ref 8.4–10.5)
CO2: 27 meq/L (ref 19–32)
CREATININE: 0.63 mg/dL (ref 0.50–1.35)
Chloride: 97 mEq/L (ref 96–112)
GFR calc Af Amer: >90 mL/min (ref >90)
Glucose, Bld: 93 mg/dL (ref 70–99)
Potassium: 4.1 mEq/L (ref 3.7–5.3)
SODIUM: 133 meq/L — AB (ref 137–147)

## 2013-10-30 LAB — CBC
HCT: 34 % — ABNORMAL LOW (ref 39.0–52.0)
Hemoglobin: 11.5 g/dL — ABNORMAL LOW (ref 13.0–17.0)
MCH: 32.8 pg (ref 26.0–34.0)
MCHC: 33.8 g/dL (ref 30.0–36.0)
MCV: 96.9 fL (ref 78.0–100.0)
PLATELETS: 379 10*3/uL (ref 150–400)
RBC: 3.51 MIL/uL — ABNORMAL LOW (ref 4.22–5.81)
RDW: 14.4 % (ref 11.5–15.5)
WBC: 30.9 10*3/uL — ABNORMAL HIGH (ref 4.0–10.5)

## 2013-10-30 MED ORDER — MORPHINE SULFATE 2 MG/ML IJ SOLN
2.0000 mg | INTRAMUSCULAR | Status: DC | PRN
  Administered 2013-10-30 (×3): 4 mg via INTRAVENOUS
  Administered 2013-10-30: 2 mg via INTRAVENOUS
  Administered 2013-10-30 – 2013-10-31 (×4): 4 mg via INTRAVENOUS
  Administered 2013-10-31: 2 mg via INTRAVENOUS
  Administered 2013-10-31 – 2013-11-01 (×5): 4 mg via INTRAVENOUS
  Filled 2013-10-30 (×5): qty 2
  Filled 2013-10-30: qty 1
  Filled 2013-10-30 (×8): qty 2

## 2013-10-30 NOTE — Progress Notes (Signed)
Progress Note   Angel [name] OVF:[id number] DOB: 1951/02/04 DOA: 10/25/2013 PCP: [name], MD   Brief Narrative:   Angel [name] is an 63 y.o. 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 Upon initial evaluation in the ED, his calcium levels found to be 15. Chemotherapy with Taxotere, cis-platinum, and 5-FU initiated.  Assessment/Plan:   Principal Problem:   Hypercalcemia of malignancy with generalized weakness/fall / altered mental status  Status post IV Decadron and pamidronate. He is also received IV fluids/Lasix.  Continue IV fluids.  Calcium level normalized 10/28/13.  Continue PT/OT.  Active Problems:   Cancer of base of tongue with regional lymph node metastasis/neck pain  Induction chemotherapy with Taxotere, cis-platinum, and 5-FU started per oncologist.  Radiation therapy will be initiated when cleared by dentistry/oncology.  Increase frequency of morphine to q. 2 hours when necessary.    Tobacco abuse  Counseled. Refuses nicotine patch.    Leukocytosis, unspecified  Thought to be from tumor necrosis and affects of dexamethasone.  Continue empiric Zosyn.    Chronic constipation  Secondary to hypercalcemia and medication side effects.  Continue aggressive bowel regimen.    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 Transfuse for hemoglobin less than 7.    Thrombocytosis  Likely reactive secondary to infected necrotic tumor.    Hypokalemia  Replaced. Magnesium WNL.    Protein-calorie malnutrition, severe / weight loss / dysphagia  Continue Ensure nutritional supplements.    DVT Prophylaxis  Continue SCDs and Lovenox.  Code Status: Full. Family Communication: Sister at bedside. Disposition Plan: Home when chemotherapy completed, likely 11/01/13.   IV Access:    PICC line   Procedures:      Ultrasound/fluoroscopic guided right DL brachial vein PICC line placed 10/26/13  Radiographic procedures as noted below.   Medical Consultants:    Dr. [name], Oncology   Other Consultants:    Dietician   Anti-Infectives:    Zosyn 10/25/13--->   Subjective:   [name] was a bit upset because his pain was not adequately controlled and he felt the nurse was not attentive to his needs and he requested more pain medicine. His pain medication only lasted 2 hours, an order was written for every 4 hour administration as needed. Nausea has improved and appetite is picking up. 5 stools documented over the past 24 hours.  Objective:    Filed Vitals:   10/29/13 0509 10/29/13 1400 10/29/13 2058 10/30/13 0542  BP: 155/84 164/92 168/89 159/93  Pulse: 92 97 76 95  Temp: 97.6 F (36.4 C) 98 F (36.7 C) 98.5 F (36.9 C) 98.5 F (36.9 C)  TempSrc: Oral Oral Oral Oral  Resp: 16 18 18 18   Height:      Weight:      SpO2: 99% 97% 100% 100%    Intake/Output Summary (Last 24 hours) at 10/30/13 1121 Last data filed at 10/30/13 0600  Gross per 24 hour  Intake   5775 ml  Output      0 ml  Net   5775 ml    Exam: Gen:  NAD Neck: Large left neck mass Cardiovascular:  RRR, No M/R/G Respiratory:  Lungs CTAB Gastrointestinal:  Abdomen soft, NT/ND, + BS Extremities:  No C/E/C   Data Reviewed:    Labs: Basic Metabolic Panel:  Recent Labs Lab 10/25/13 1529 10/26/13 0415 10/27/13 8416  10/28/13 0515 10/29/13 0319 10/30/13 0600  NA 138 138 140 136* 136* 133*  K 3.1* 3.3* 3.1* 3.3* 3.8 4.1  CL 97 96 103 100 100 97  CO2 31 32 28 26 27 27   GLUCOSE 113* 138* 136* 131* 119* 93  BUN 13 15 17 17 14 11   CREATININE 0.70 0.82 0.70 0.63 0.61 0.63  CALCIUM 13.4* 13.0* 11.4* 9.2 8.1* 7.7*  MG  --  1.9  --  2.3  --   --    GFR Estimated Creatinine Clearance: 64.5 ml/min (by C-G formula based on Cr of 0.63). Liver Function Tests:  Recent Labs Lab 10/25/13 0908  10/26/13 0415 10/28/13 0515  AST 13 11 15   ALT 10 8 12   ALKPHOS 74 63 50  BILITOT 0.4 0.3 0.3  PROT 7.8 6.4 5.4*  ALBUMIN 3.5 2.8* 2.3*   CBC:  Recent Labs Lab 10/25/13 0908 10/27/13 0513 10/28/13 0515 10/29/13 0319 10/30/13 0600  WBC 25.5* 32.1* 27.8* 34.1* 30.9*  NEUTROABS 20.1*  --   --   --   --   HGB 14.2 10.7* 10.8* 11.1* 11.5*  HCT 43.0 31.7* 32.7* 32.3* 34.0*  MCV 98.2 97.5 97.9 96.1 96.9  PLT 680* 543* 503* 476* 379   Sepsis Labs:  Recent Labs Lab 10/25/13 0908 10/25/13 1119 10/27/13 0513 10/28/13 0515 10/29/13 0319 10/30/13 0600  WBC 25.5*  --  32.1* 27.8* 34.1* 30.9*  LATICACIDVEN  --  1.16  --   --   --   --    Microbiology No results found for this or any previous visit (from the past 240 hour(s)).   Radiographs/Studies:   Nm Pet Image Initial (pi) Skull Base To Thigh  10/15/2013   CLINICAL DATA:  Initial treatment strategy for tongue cancer.  EXAM: NUCLEAR MEDICINE PET SKULL BASE TO THIGH  TECHNIQUE: 6.4 mCi F-18 FDG was injected intravenously. Full-ring PET imaging was performed from the skull base to thigh after the radiotracer. CT data was obtained and used for attenuation correction and anatomic localization.  FASTING BLOOD GLUCOSE:  Value: 147 mg/dl  COMPARISON:  None.  FINDINGS: NECK  Large base of tongue mass measures 4.7 cm and has an SUV max equal 14.6. Large, necrotic lymph node within the left side of neck measures 8.1 x 7.8 cm and has an SUV max equal to 14.5. Hypermetabolic right level 2 lymph node measures 2.3 cm and has an SUV max equal to 10. There is a small right-sided level 4 lymph node which measures 8 mm and has an SUV max equal to 2.8.  CHEST  No hypermetabolic mediastinal or hilar nodes. No suspicious pulmonary nodules on the CT scan. Clustered nodules within the posterior right upper lobe are identified, image 23/series 7. There is associated cylindrical type bronchiectasis. Mild changes of centrilobular emphysema noted. The heart  size is normal. No pericardial effusion. There is mild calcified atherosclerotic disease involving the thoracic aorta. Calcification involving the LAD coronary artery noted.  ABDOMEN/PELVIS  No abnormal hypermetabolic activity within the liver, pancreas, adrenal glands, or spleen. No hypermetabolic lymph nodes in the abdomen or pelvis.  SKELETON  No focal hypermetabolic activity to suggest skeletal metastasis.  IMPRESSION: 1. Examination is positive for large, hypermetabolic base of tongue mass. 2. Bilateral cervical lymph node metastasis. 3. No evidence for hypermetabolic metastasis to the chest abdomen or pelvis. 4. Postinflammatory/infectious changes within the posterior right upper lobe. 5. Atherosclerotic disease including multi vessel coronary artery calcifications.   Electronically Signed   By:  Kerby Moors M.D.   On: 10/15/2013 09:53   Ir Fluoro Guide Cv Line Right  10/26/2013   CLINICAL DATA:  History of squamous cell cancer of the oropharynx; central venous access is requested for chemotherapy.  EXAM: IR RIGHT FLOURO GUIDE CV LINE; IR ULTRASOUND GUIDANCE VASC ACCESS RIGHT  FLUOROSCOPY TIME:  24 seconds  TECHNIQUE: The right arm was prepped with chlorhexidine, draped in the usual sterile fashion using maximum barrier technique (cap and mask, sterile gown, sterile gloves, large sterile sheet, hand hygiene and cutaneous antiseptic). Local anesthesia was attained by infiltration with 1% lidocaine.  Ultrasound demonstrated patency of the right brachial vein, and this was documented with an image. Under real-time ultrasound guidance, this vein was accessed with a 21 gauge micropuncture needle and image documentation was performed. The needle was exchanged over a guidewire for a peel-away sheath through which a 39 cm 5 [place] double lumen power injectable PICC was advanced, and positioned with its tip at the lower SVC/right atrial junction. Fluoroscopy during the procedure and fluoro spot radiograph confirms  appropriate catheter position. The catheter was flushed, secured to the skin with Prolene sutures, and covered with a sterile dressing.  COMPLICATIONS: None.  The patient tolerated the procedure well.  IMPRESSION: Successful placement of a right arm PICC with sonographic and fluoroscopic guidance. The catheter is ready for use.  Read by: Rowe Robert ,P.A.-C.   Electronically Signed   By: Aletta Edouard M.D.   On: 10/26/2013 14:14   Ir US Guide Vasc Access Right  10/26/2013   CLINICAL DATA:  History of squamous cell cancer of the oropharynx; central venous access is requested for chemotherapy.  EXAM: IR RIGHT FLOURO GUIDE CV LINE; IR ULTRASOUND GUIDANCE VASC ACCESS RIGHT  FLUOROSCOPY TIME:  24 seconds  TECHNIQUE: The right arm was prepped with chlorhexidine, draped in the usual sterile fashion using maximum barrier technique (cap and mask, sterile gown, sterile gloves, large sterile sheet, hand hygiene and cutaneous antiseptic). Local anesthesia was attained by infiltration with 1% lidocaine.  Ultrasound demonstrated patency of the right brachial vein, and this was documented with an image. Under real-time ultrasound guidance, this vein was accessed with a 21 gauge micropuncture needle and image documentation was performed. The needle was exchanged over a guidewire for a peel-away sheath through which a 39 cm 5 [place] double lumen power injectable PICC was advanced, and positioned with its tip at the lower SVC/right atrial junction. Fluoroscopy during the procedure and fluoro spot radiograph confirms appropriate catheter position. The catheter was flushed, secured to the skin with Prolene sutures, and covered with a sterile dressing.  COMPLICATIONS: None.  The patient tolerated the procedure well.  IMPRESSION: Successful placement of a right arm PICC with sonographic and fluoroscopic guidance. The catheter is ready for use.  Read by: Rowe Robert ,P.A.-C.   Electronically Signed   By: Aletta Edouard M.D.   On:  10/26/2013 14:14    Medications:   . antiseptic oral rinse  15 mL Mouth Rinse BID  . chlorhexidine  15 mL Mouth/Throat BID  . enoxaparin (LOVENOX) injection  30 mg Subcutaneous Q24H  . feeding supplement (ENSURE COMPLETE)  237 mL Oral BID WC  . feeding supplement (ENSURE)  1 Container Oral BID BM  . FLUOROURACIL (ADRUCIL) CHEMO infusion For Inpatient Use  750 mg/m2/day (Treatment Plan Actual) Intravenous Q24H  . lactulose  10 g Oral BID  . pantoprazole (PROTONIX) IV  40 mg Intravenous Q24H  . piperacillin-tazobactam (ZOSYN)  IV  3.375 g Intravenous Q8H  . polyethylene glycol  17 g Oral Daily  . senna-docusate  2 tablet Oral BID   Continuous Infusions: . 0.9 % NaCl with KCl 20 mEq / L 150 mL/hr at 10/30/13 0300    Time spent: 25 minutes.    LOS: 5 days   [place] [facility] Pager [phone]. If unable to reach me by pager, please call my cell phone at [phone].  *Please refer to [email or web address], password [email or web address] to get updated schedule on who will round on this patient, as hospitalists switch teams weekly. If 7PM-7AM, please contact night-coverage at [email or web address], password [email or web address] for any overnight needs.  10/30/2013, 11:21 AM    **Disclaimer: This note was dictated with voice recognition software. Similar sounding words can inadvertently be transcribed and this note may contain transcription errors which may not have been corrected upon publication of note.**

## 2013-10-30 NOTE — Progress Notes (Signed)
IP PROGRESS NOTE  Subjective:   He is alert. No significant nausea. He would like to advance his diet. He reports improvement in swallowing.  Objective: Vital signs in last 24 hours: Blood pressure 159/93, pulse 95, temperature 98.5 F (36.9 C), temperature source Oral, resp. rate 18, height 5' 5.5" (1.664 m), weight 103 lb 9.9 oz (47 kg), SpO2 100.00%.  Intake/Output from previous day: 07/03 0701 - 07/04 0700 In: 5775 [P.O.:800; I.V.:3750; IV Piggyback:1225] Out: -   Physical Exam:  HEENT: No thrush or ulcers. Left neck mass with a bandage in place. Lungs: Clear bilaterally Cardiac: Regular rate and rhythm Abdomen: No hepatosplenomegaly, nontender Extremities: No leg edema Neurologic: Alert and oriented    Lab Results:  Recent Labs  10/29/13 0319 10/30/13 0600  WBC 34.1* 30.9*  HGB 11.1* 11.5*  HCT 32.3* 34.0*  PLT 476* 379    BMET  Recent Labs  10/29/13 0319 10/30/13 0600  NA 136* 133*  K 3.8 4.1  CL 100 97  CO2 27 27  GLUCOSE 119* 93  BUN 14 11  CREATININE 0.61 0.63  CALCIUM 8.1* 7.7*    Studies/Results: No results found.  Medications: I have reviewed the patient's current medications.  Assessment/Plan:  1. Head and neck cancer, now at [facility] of docetaxel/cisplatin/5-fluorouracil  2. hypercalcemia of malignancy-improved following Decadron, and Midrin a, and hydration  3. leukocytosis/thrombocytosis-likely secondary to inflammation from the left neck tumor  4. neck pain secondary to head and neck cancer  He appears to be tolerating the chemotherapy well. The confusion has improved following treatment of hypercalcemia. He reports improvement in swallowing. I will advance his diet.  Please call oncology as needed. Dr. Alvy Bimler will see him 11/01/2013.   LOS: 5 days   [place]  10/30/2013, 9:04 AM

## 2013-10-31 LAB — CBC
HCT: 33.6 % — ABNORMAL LOW (ref 39.0–52.0)
Hemoglobin: 11.4 g/dL — ABNORMAL LOW (ref 13.0–17.0)
MCH: 32.6 pg (ref 26.0–34.0)
MCHC: 33.9 g/dL (ref 30.0–36.0)
MCV: 96 fL (ref 78.0–100.0)
Platelets: 298 10*3/uL (ref 150–400)
RBC: 3.5 MIL/uL — ABNORMAL LOW (ref 4.22–5.81)
RDW: 14.5 % (ref 11.5–15.5)
WBC: 20.1 10*3/uL — ABNORMAL HIGH (ref 4.0–10.5)

## 2013-10-31 LAB — BASIC METABOLIC PANEL
ANION GAP: 13 (ref 5–15)
BUN: 9 mg/dL (ref 6–23)
CO2: 25 mEq/L (ref 19–32)
Calcium: 7.5 mg/dL — ABNORMAL LOW (ref 8.4–10.5)
Chloride: 96 mEq/L (ref 96–112)
Creatinine, Ser: 0.6 mg/dL (ref 0.50–1.35)
GFR calc non Af Amer: >90 mL/min (ref >90)
Glucose, Bld: 102 mg/dL — ABNORMAL HIGH (ref 70–99)
POTASSIUM: 3.7 meq/L (ref 3.7–5.3)
Sodium: 134 mEq/L — ABNORMAL LOW (ref 137–147)

## 2013-10-31 NOTE — Progress Notes (Signed)
Progress Note   Angel [name] UXL:[id number] DOB: 04-13-1951 DOA: 10/25/2013 PCP: Angel Lark, MD   Brief Narrative:   Angel [name] is an 63 y.o. 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 Upon initial evaluation in the ED, his calcium levels found to be 15. Chemotherapy with Taxotere, cis-platinum, and 5-FU initiated.  Assessment/Plan:   Principal Problem:   Hypercalcemia of malignancy with generalized weakness/fall / altered mental status  Status post IV Decadron and pamidronate. He is also received IV fluids/Lasix.  Continue IV fluids.  Calcium level normalized 10/28/13.  Continue PT/OT.  Active Problems:   Cancer of base of tongue with regional lymph node metastasis/neck pain  Induction chemotherapy with Taxotere, cis-platinum, and 5-FU started per oncologist. 5-FU should finish up 11/01/13.  Radiation therapy will be initiated when cleared by dentistry/oncology.  Increase frequency of morphine to q. 2 hours when necessary.    Tobacco abuse  Counseled. Refuses nicotine patch.    Leukocytosis, unspecified  Thought to be from tumor necrosis and affects of dexamethasone.  Continue empiric Zosyn.    Chronic constipation  Secondary to hypercalcemia and medication side effects.  Continue aggressive bowel regimen.    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 Transfuse for hemoglobin less than 7.    Thrombocytosis  Likely reactive secondary to infected necrotic tumor.    Hypokalemia  Replaced. Magnesium WNL.    Protein-calorie malnutrition, severe / weight loss / dysphagia  Continue Ensure nutritional supplements.    DVT Prophylaxis  Continue SCDs and Lovenox.  Code Status: Full. Family Communication: Sister at bedside 10/30/13, no family at bedside today. Disposition Plan: Home when chemotherapy completed,  likely 11/01/13.   IV Access:    PICC line   Procedures:    Ultrasound/fluoroscopic guided right DL brachial vein PICC line placed 10/26/13  Radiographic procedures as noted below.   Medical Consultants:    Angel [name], Oncology   Other Consultants:    Dietician   Anti-Infectives:    Zosyn 10/25/13--->   Subjective:   Angel [name] reports better pain control. Nursing staff reports that he is getting 4 mg of morphine every 2 hours. Still having some nausea, vomiting and reflux type symptoms at times. Nursing staff reported some confusion last night, but seems clear this morning.  Objective:    Filed Vitals:   10/30/13 0542 10/30/13 1628 10/30/13 2046 10/31/13 0535  BP: 159/93 132/81 139/89 132/93  Pulse: 95 103 105 97  Temp: 98.5 F (36.9 C) 98.8 F (37.1 C) 98.4 F (36.9 C) 98.2 F (36.8 C)  TempSrc: Oral Oral Oral Oral  Resp: 18 18 18 18   Height:      Weight:      SpO2: 100% 99% 98% 98%    Intake/Output Summary (Last 24 hours) at 10/31/13 0732 Last data filed at 10/31/13 0535  Gross per 24 hour  Intake 2922.78 ml  Output      0 ml  Net 2922.78 ml    Exam: Gen:  NAD Neck: Large left neck mass Cardiovascular:  RRR, No M/R/G Respiratory:  Lungs CTAB Gastrointestinal:  Abdomen soft, NT/ND, + BS Extremities:  No C/E/C   Data Reviewed:    Labs: Basic Metabolic Panel:  Recent Labs Lab 10/25/13 1529 10/26/13 0415 10/27/13 0513 10/28/13 0515 10/29/13 0319 10/30/13 0600  NA 138 138 140 136* 136* 133*  K 3.1* 3.3* 3.1* 3.3* 3.8 4.1  CL 97 96 103 100 100 97  CO2 31 32 28 26 27 27   GLUCOSE 113* 138* 136* 131* 119* 93  BUN 13 15 17 17 14 11   CREATININE 0.70 0.82 0.70 0.63 0.61 0.63  CALCIUM 13.4* 13.0* 11.4* 9.2 8.1* 7.7*  MG  --  1.9  --  2.3  --   --    GFR Estimated Creatinine Clearance: 64.5 ml/min (by C-G formula based on Cr of 0.63). Liver Function Tests:  Recent Labs Lab 10/25/13 0908 10/26/13 0415 10/28/13 0515    AST 13 11 15   ALT 10 8 12   ALKPHOS 74 63 50  BILITOT 0.4 0.3 0.3  PROT 7.8 6.4 5.4*  ALBUMIN 3.5 2.8* 2.3*   CBC:  Recent Labs Lab 10/25/13 0908 10/27/13 0513 10/28/13 0515 10/29/13 0319 10/30/13 0600  WBC 25.5* 32.1* 27.8* 34.1* 30.9*  NEUTROABS 20.1*  --   --   --   --   HGB 14.2 10.7* 10.8* 11.1* 11.5*  HCT 43.0 31.7* 32.7* 32.3* 34.0*  MCV 98.2 97.5 97.9 96.1 96.9  PLT 680* 543* 503* 476* 379   Sepsis Labs:  Recent Labs Lab 10/25/13 0908 10/25/13 1119 10/27/13 0513 10/28/13 0515 10/29/13 0319 10/30/13 0600  WBC 25.5*  --  32.1* 27.8* 34.1* 30.9*  LATICACIDVEN  --  1.16  --   --   --   --    Microbiology No results found for this or any previous visit (from the past 240 hour(s)).   Radiographs/Studies:   Nm Pet Image Initial (pi) Skull Base To Thigh  10/15/2013   CLINICAL DATA:  Initial treatment strategy for tongue cancer.  EXAM: NUCLEAR MEDICINE PET SKULL BASE TO THIGH  TECHNIQUE: 6.4 mCi F-18 FDG was injected intravenously. Full-ring PET imaging was performed from the skull base to thigh after the radiotracer. CT data was obtained and used for attenuation correction and anatomic localization.  FASTING BLOOD GLUCOSE:  Value: 147 mg/dl  COMPARISON:  None.  FINDINGS: NECK  Large base of tongue mass measures 4.7 cm and has an SUV max equal 14.6. Large, necrotic lymph node within the left side of neck measures 8.1 x 7.8 cm and has an SUV max equal to 14.5. Hypermetabolic right level 2 lymph node measures 2.3 cm and has an SUV max equal to 10. There is a small right-sided level 4 lymph node which measures 8 mm and has an SUV max equal to 2.8.  CHEST  No hypermetabolic mediastinal or hilar nodes. No suspicious pulmonary nodules on the CT scan. Clustered nodules within the posterior right upper lobe are identified, image 23/series 7. There is associated cylindrical type bronchiectasis. Mild changes of centrilobular emphysema noted. The heart size is normal. No pericardial  effusion. There is mild calcified atherosclerotic disease involving the thoracic aorta. Calcification involving the LAD coronary artery noted.  ABDOMEN/PELVIS  No abnormal hypermetabolic activity within the liver, pancreas, adrenal glands, or spleen. No hypermetabolic lymph nodes in the abdomen or pelvis.  SKELETON  No focal hypermetabolic activity to suggest skeletal metastasis.  IMPRESSION: 1. Examination is positive for large, hypermetabolic base of tongue mass. 2. Bilateral cervical lymph node metastasis. 3. No evidence for hypermetabolic metastasis to the chest abdomen or pelvis. 4. Postinflammatory/infectious changes within the posterior right upper lobe. 5. Atherosclerotic disease including multi vessel coronary artery calcifications.   Electronically Signed   By: Kerby Moors M.D.   On: 10/15/2013 09:53   Ir Fluoro Guide  Cv Line Right  10/26/2013   CLINICAL DATA:  History of squamous cell cancer of the oropharynx; central venous access is requested for chemotherapy.  EXAM: IR RIGHT FLOURO GUIDE CV LINE; IR ULTRASOUND GUIDANCE VASC ACCESS RIGHT  FLUOROSCOPY TIME:  24 seconds  TECHNIQUE: The right arm was prepped with chlorhexidine, draped in the usual sterile fashion using maximum barrier technique (cap and mask, sterile gown, sterile gloves, large sterile sheet, hand hygiene and cutaneous antiseptic). Local anesthesia was attained by infiltration with 1% lidocaine.  Ultrasound demonstrated patency of the right brachial vein, and this was documented with an image. Under real-time ultrasound guidance, this vein was accessed with a 21 gauge micropuncture needle and image documentation was performed. The needle was exchanged over a guidewire for a peel-away sheath through which a 39 cm 5 [place] double lumen power injectable PICC was advanced, and positioned with its tip at the lower SVC/right atrial junction. Fluoroscopy during the procedure and fluoro spot radiograph confirms appropriate catheter position.  The catheter was flushed, secured to the skin with Prolene sutures, and covered with a sterile dressing.  COMPLICATIONS: None.  The patient tolerated the procedure well.  IMPRESSION: Successful placement of a right arm PICC with sonographic and fluoroscopic guidance. The catheter is ready for use.  Read by: Rowe Robert ,P.A.-C.   Electronically Signed   By: Aletta Edouard M.D.   On: 10/26/2013 14:14   Ir US Guide Vasc Access Right  10/26/2013   CLINICAL DATA:  History of squamous cell cancer of the oropharynx; central venous access is requested for chemotherapy.  EXAM: IR RIGHT FLOURO GUIDE CV LINE; IR ULTRASOUND GUIDANCE VASC ACCESS RIGHT  FLUOROSCOPY TIME:  24 seconds  TECHNIQUE: The right arm was prepped with chlorhexidine, draped in the usual sterile fashion using maximum barrier technique (cap and mask, sterile gown, sterile gloves, large sterile sheet, hand hygiene and cutaneous antiseptic). Local anesthesia was attained by infiltration with 1% lidocaine.  Ultrasound demonstrated patency of the right brachial vein, and this was documented with an image. Under real-time ultrasound guidance, this vein was accessed with a 21 gauge micropuncture needle and image documentation was performed. The needle was exchanged over a guidewire for a peel-away sheath through which a 39 cm 5 [place] double lumen power injectable PICC was advanced, and positioned with its tip at the lower SVC/right atrial junction. Fluoroscopy during the procedure and fluoro spot radiograph confirms appropriate catheter position. The catheter was flushed, secured to the skin with Prolene sutures, and covered with a sterile dressing.  COMPLICATIONS: None.  The patient tolerated the procedure well.  IMPRESSION: Successful placement of a right arm PICC with sonographic and fluoroscopic guidance. The catheter is ready for use.  Read by: Rowe Robert ,P.A.-C.   Electronically Signed   By: Aletta Edouard M.D.   On: 10/26/2013 14:14     Medications:   . antiseptic oral rinse  15 mL Mouth Rinse BID  . chlorhexidine  15 mL Mouth/Throat BID  . enoxaparin (LOVENOX) injection  30 mg Subcutaneous Q24H  . feeding supplement (ENSURE COMPLETE)  237 mL Oral BID WC  . feeding supplement (ENSURE)  1 Container Oral BID BM  . FLUOROURACIL (ADRUCIL) CHEMO infusion For Inpatient Use  750 mg/m2/day (Treatment Plan Actual) Intravenous Q24H  . lactulose  10 g Oral BID  . pantoprazole (PROTONIX) IV  40 mg Intravenous Q24H  . piperacillin-tazobactam (ZOSYN)  IV  3.375 g Intravenous Q8H  . polyethylene glycol  17 g Oral Daily  .  senna-docusate  2 tablet Oral BID   Continuous Infusions: . 0.9 % NaCl with KCl 20 mEq / L 150 mL/hr at 10/31/13 0006    Time spent: 25 minutes.    LOS: 6 days   [facility] Pager [phone]. If unable to reach me by pager, please call my cell phone at [phone].  *Please refer to [email or web address], password [email or web address] to get updated schedule on who will round on this patient, as hospitalists switch teams weekly. If 7PM-7AM, please contact night-coverage at [email or web address], password [email or web address] for any overnight needs.  10/31/2013, 7:32 AM    **Disclaimer: This note was dictated with voice recognition software. Similar sounding words can inadvertently be transcribed and this note may contain transcription errors which may not have been corrected upon publication of note.**

## 2013-11-01 ENCOUNTER — Other Ambulatory Visit: Payer: Self-pay | Admitting: Hematology and Oncology

## 2013-11-01 ENCOUNTER — Ambulatory Visit: Payer: Self-pay

## 2013-11-01 ENCOUNTER — Telehealth: Payer: Self-pay | Admitting: *Deleted

## 2013-11-01 ENCOUNTER — Other Ambulatory Visit: Payer: Self-pay | Admitting: *Deleted

## 2013-11-01 DIAGNOSIS — R1319 Other dysphagia: Secondary | ICD-10-CM

## 2013-11-01 LAB — BASIC METABOLIC PANEL
ANION GAP: 12 (ref 5–15)
BUN: 8 mg/dL (ref 6–23)
CHLORIDE: 96 meq/L (ref 96–112)
CO2: 23 mEq/L (ref 19–32)
Calcium: 7.4 mg/dL — ABNORMAL LOW (ref 8.4–10.5)
Creatinine, Ser: 0.58 mg/dL (ref 0.50–1.35)
GFR calc non Af Amer: >90 mL/min (ref >90)
Glucose, Bld: 113 mg/dL — ABNORMAL HIGH (ref 70–99)
Potassium: 3.9 mEq/L (ref 3.7–5.3)
SODIUM: 131 meq/L — AB (ref 137–147)

## 2013-11-01 LAB — CBC
HCT: 32.1 % — ABNORMAL LOW (ref 39.0–52.0)
Hemoglobin: 11.1 g/dL — ABNORMAL LOW (ref 13.0–17.0)
MCH: 32.8 pg (ref 26.0–34.0)
MCHC: 34.6 g/dL (ref 30.0–36.0)
MCV: 95 fL (ref 78.0–100.0)
PLATELETS: 268 10*3/uL (ref 150–400)
RBC: 3.38 MIL/uL — ABNORMAL LOW (ref 4.22–5.81)
RDW: 14.2 % (ref 11.5–15.5)
WBC: 9.3 10*3/uL (ref 4.0–10.5)

## 2013-11-01 MED ORDER — POLYETHYLENE GLYCOL 3350 17 G PO PACK: 17.0000 g | PACK | Freq: Every day | ORAL | Status: DC

## 2013-11-01 MED ORDER — SENNOSIDES-DOCUSATE SODIUM 8.6-50 MG PO TABS: 2.0000 | ORAL_TABLET | Freq: Two times a day (BID) | ORAL | Status: DC

## 2013-11-01 MED ORDER — BIOTENE DRY MOUTH MT LIQD: 15.0000 mL | Freq: Two times a day (BID) | OROMUCOSAL | Status: DC

## 2013-11-01 MED ORDER — LACTULOSE 10 GM/15ML PO SOLN
10.0000 g | Freq: Two times a day (BID) | ORAL | Status: DC
Start: 2013-11-01 — End: 2013-11-07

## 2013-11-01 MED ORDER — PANTOPRAZOLE SODIUM 40 MG PO TBEC: 40.0000 mg | DELAYED_RELEASE_TABLET | Freq: Every day | ORAL | Status: DC

## 2013-11-01 MED ORDER — PANTOPRAZOLE SODIUM 40 MG PO TBEC
40.0000 mg | DELAYED_RELEASE_TABLET | Freq: Every day | ORAL | Status: DC
  Administered 2013-11-01: 40 mg via ORAL
  Filled 2013-11-01: qty 1

## 2013-11-01 MED ORDER — HEPARIN SOD (PORK) LOCK FLUSH 100 UNIT/ML IV SOLN
250.0000 [IU] | INTRAVENOUS | Status: AC | PRN
  Administered 2013-11-01: 250 [IU]
  Filled 2013-11-01: qty 5

## 2013-11-01 MED ORDER — MORPHINE SULFATE (CONCENTRATE) 10 MG /0.5 ML PO SOLN: 5.0000 mg | ORAL | Status: DC | PRN

## 2013-11-01 NOTE — Telephone Encounter (Signed)
Wife called to confirm pt's appts for tomorrow and the rest of this week.

## 2013-11-01 NOTE — Telephone Encounter (Signed)
Per staff message and POF I have scheduled appts. Advised scheduler of appts. JMW  

## 2013-11-01 NOTE — Progress Notes (Signed)
Physical Therapy Treatment Patient Details Name: Angel [name] MRN: [id number] DOB: 1950-10-05 Today's Date: 11/01/2013    History of Present Illness Angel [name] is an 63 y.o. 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  Had pt amb in hallway NOT holding to IV pole.  Performed well, no LOB.  Practiced negotiating 2 steps with one rail.  Pt eager to D/C to home later today.  Follow Up Recommendations  No PT follow up     Equipment Recommendations       Recommendations for Other Services       Precautions / Restrictions Precautions Precautions: Fall Restrictions Weight Bearing Restrictions: No    Mobility  Bed Mobility                  Transfers Overall transfer level: Modified independent Equipment used: None Transfers: Sit to/from Stand Sit to Stand: Modified independent (Device/Increase time)         General transfer comment: Pt getting around room and to and from BR on his own  Ambulation/Gait Ambulation/Gait assistance: Supervision Ambulation Distance (Feet): 375 Feet Assistive device: None Gait Pattern/deviations: Step-through pattern;Decreased stride length Gait velocity: WFL   General Gait Details: had pt amb without holding to IV pole.     Stairs Stairs: Yes Stairs assistance: Supervision Stair Management: One rail Right;Step to pattern;Forwards Number of Stairs: 2 General stair comments: good safety  Wheelchair Mobility    Modified Rankin (Stroke Patients Only)       Balance                                    Cognition                            Exercises      General Comments        Pertinent Vitals/Pain No c/o pain    Home Living                      Prior Function            PT Goals (current goals can now be found in the care plan section) Progress towards PT goals: Progressing toward goals    Frequency  Min 3X/week    PT Plan      Co-evaluation             End of Session Equipment Utilized During Treatment: Gait belt Activity Tolerance: Patient tolerated treatment well Patient left: in bed;with call bell/phone within reach;with family/visitor present     Time: 1010-1020 PT Time Calculation (min): 10 min  Charges:  $Gait Training: 8-22 mins                    G Codes:      Rica Koyanagi  PTA [facility]  Acute  Rehab Pager      [phone]

## 2013-11-01 NOTE — Progress Notes (Signed)
This patient is receiving protonix. Based on criteria approved by the Pharmacy and Therapeutics Committee, this medication is being converted to the equivalent oral dose form. These criteria include:   . The patient is eating (either orally or per tube) and/or has been taking other orally administered medications for at least 24 hours.  . This patient has no evidence of active gastrointestinal bleeding or impaired GI absorption (gastrectomy, short bowel, patient on TNA or NPO).   If you have questions about this conversion, please contact the pharmacy department.  Kara Mead, [facility] 11/01/2013 8:44 AM

## 2013-11-01 NOTE — Progress Notes (Signed)
Occupational Therapy Treatment Patient Details Name: TIAGO HUMPHREY MRN: [id number] DOB: [birth year] Today's Date: 11/01/2013    History of present illness Angel [name] is an 63 y.o. 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  Follow Up Recommendations  No OT follow up          Precautions / Restrictions Precautions Precautions: Fall Precaution Comments: pt fell prior to admission       Mobility Bed Mobility Overal bed mobility: Modified Independent                Transfers Overall transfer level: Needs assistance Equipment used: None Transfers: Sit to/from Stand Sit to Stand: Supervision                  ADL       Grooming: Wash/dry hands;Wash/dry face;Standing;Supervision/safety                   Toilet Transfer: Supervision/safety;Comfort height toilet;Ambulation   Toileting- Clothing Manipulation and Hygiene: Supervision/safety;Sit to/from stand       Functional mobility during ADLs: Supervision/safety                  Cognition   Behavior During Therapy: [facility] for tasks assessed/performed Overall Cognitive Status: Within Functional Limits for tasks assessed                           Progress Toward Goals  OT Goals(current goals can now be found in the care plan section)  Progress towards OT goals: Progressing toward goals     Plan Discharge plan needs to be updated       End of Session     Activity Tolerance Patient tolerated treatment well   Patient Left with call bell/phone within reach;in bed           Time: 6203-5597 OT Time Calculation (min): 16 min  Charges: OT General Charges $OT Visit: 1 Procedure OT Treatments $Self Care/Home Management : 8-22 mins  Tzvi Economou D 11/01/2013, 8:56 AM

## 2013-11-01 NOTE — Progress Notes (Signed)
Angel [name]   DOB:Sep 13, 1950   ZY#:[id number]   UXN#:[id number]  I have seen the patient, examined him and edited the notes as follows  Subjective: Patient seen and examined. No new events overnight. Tolerating chemotherapy to date, today last dose. No confusion. Denies headaches or vision changes. Appetite is good tolerates advanced diet.  Denies constipation, last bowel movement on 7/5. Pain controlled with current regimen. No palpitations. Denies shortness of breath. Denies nausea or vomiting. He is ambulating without difficulty. No numbness or tingling.   Scheduled Meds: . antiseptic oral rinse  15 mL Mouth Rinse BID  . chlorhexidine  15 mL Mouth/Throat BID  . enoxaparin (LOVENOX) injection  30 mg Subcutaneous Q24H  . feeding supplement (ENSURE COMPLETE)  237 mL Oral BID WC  . feeding supplement (ENSURE)  1 Container Oral BID BM  . FLUOROURACIL (ADRUCIL) CHEMO infusion For Inpatient Use  750 mg/m2/day (Treatment Plan Actual) Intravenous Q24H  . lactulose  10 g Oral BID  . pantoprazole (PROTONIX) IV  40 mg Intravenous Q24H  . piperacillin-tazobactam (ZOSYN)  IV  3.375 g Intravenous Q8H  . polyethylene glycol  17 g Oral Daily  . senna-docusate  2 tablet Oral BID   Continuous Infusions: . 0.9 % NaCl with KCl 20 mEq / L 150 mL/hr at 11/01/13 0237   PRN Meds:.acetaminophen, alum & mag hydroxide-simeth, magnesium hydroxide, morphine, morphine injection, ondansetron (ZOFRAN) IV, ondansetron, promethazine, sodium chloride, sodium chloride   Objective:  Filed Vitals:   11/01/13 0618  BP: 134/85  Pulse: 99  Temp: 98 F (36.7 C)  Resp: 16      Intake/Output Summary (Last 24 hours) at 11/01/13 0725 Last data filed at 11/01/13 2542  Gross per 24 hour  Intake 3154.97 ml  Output    375 ml  Net 2779.97 ml    ECOG PERFORMANCE STATUS: 1    GENERAL:alert,oriented x 3, conversant. SKIN: skin color, texture, turgor are normal, no rashes or significant lesions. Noted discoloration  around his neck wound.  EYES: normal, conjunctiva are pink and non-injected, sclera clear  OROPHARYNX:no exudate, no erythema and lips, buccal mucosa, and tongue normal . Poor dentition is noted  NECK: a large left-sided neck mass with bandage in place. LYMPH: Bilateral cervical lymphadenopathy. No lymphadenopathy elsewhere.  LUNGS: clear to auscultation and percussion with normal breathing effort  HEART: regular rate & rhythm and no murmurs and no lower extremity edema  ABDOMEN:abdomen soft, non-tender and normal bowel sounds  Musculoskeletal:no cyanosis of digits and no clubbing  PSYCH: alert & oriented, follows commands appropriately. Moving all extremities. NEURO: no focal motor/sensory deficits    CBG (last 3)  No results found for this basename: GLUCAP,  in the last 72 hours   Labs:   Recent Labs Lab 10/25/13 0908  10/28/13 0515 10/29/13 0319 10/30/13 0600 10/31/13 0545 11/01/13 0630  WBC 25.5*  < > 27.8* 34.1* 30.9* 20.1* 9.3  HGB 14.2  < > 10.8* 11.1* 11.5* 11.4* 11.1*  HCT 43.0  < > 32.7* 32.3* 34.0* 33.6* 32.1*  PLT 680*  < > 503* 476* 379 298 268  MCV 98.2  < > 97.9 96.1 96.9 96.0 95.0  MCH 32.4  < > 32.3 33.0 32.8 32.6 32.8  MCHC 33.0  < > 33.0 34.4 33.8 33.9 34.6  RDW 14.1  < > 14.0 14.1 14.4 14.5 14.2  LYMPHSABS 2.3  --   --   --   --   --   --   MONOABS 2.3*  --   --   --   --   --   --  EOSABS 0.8*  --   --   --   --   --   --   BASOSABS 0.0  --   --   --   --   --   --   < > = values in this interval not displayed.   Chemistries:    Recent Labs Lab 10/25/13 0908 10/25/13 1529 10/26/13 0415  10/28/13 0515 10/29/13 0319 10/30/13 0600 10/31/13 0545 11/01/13 0630  NA 138 138 138  < > 136* 136* 133* 134* 131*  K 3.1* 3.1* 3.3*  < > 3.3* 3.8 4.1 3.7 3.9  CL 90* 97 96  < > 100 100 97 96 96  CO2 34* 31 32  < > 26 27 27 25 23   GLUCOSE 125* 113* 138*  < > 131* 119* 93 102* 113*  BUN 16 13 15   < > 17 14 11 9 8   CREATININE 0.80 0.70 0.82  < > 0.63  0.61 0.63 0.60 0.58  CALCIUM >15.0* 13.4* 13.0*  < > 9.2 8.1* 7.7* 7.5* 7.4*  MG  --   --  1.9  --  2.3  --   --   --   --   AST 13  --  11  --  15  --   --   --   --   ALT 10  --  8  --  12  --   --   --   --   ALKPHOS 74  --  63  --  50  --   --   --   --   BILITOT 0.4  --  0.3  --  0.3  --   --   --   --   < > = values in this interval not displayed.  GFR Estimated Creatinine Clearance: 64.5 ml/min (by C-G formula based on Cr of 0.58).  Liver Function Tests:  Recent Labs Lab 10/25/13 0908 10/26/13 0415 10/28/13 0515  AST 13 11 15   ALT 10 8 12   ALKPHOS 74 63 50  BILITOT 0.4 0.3 0.3  PROT 7.8 6.4 5.4*  ALBUMIN 3.5 2.8* 2.3*     Assessment/Plan: 63 y.o.   1. Cancer of base of tongue with regional lymph node metastasis  PET CT scan showed no evidence of distant metastatic disease. He is a good candidate for induction chemotherapy followed by radiation treatment, with intent to cure.  IV team performed PICC line placement on 6/30 All risks and benefits of chemotherapy with  5FU/Docetaxel and cisplatin were discussed and patient signed consent form.  D1C1 started on 7/1, tolerating chemo well since its initiation. Today is Day 6 Cycle 1.  Plan to discharge today. Neulasta tomorrow and see me back in the outpatient on 11/04/2013. I plan to keep his PICC line and we'll order PICC line care as an outpatient.  2. Malignant hypercalcemia  Due to untreated squamous cell carcinoma of the base of tongue.   IV Decadron, IV pamidronate, IV fluids and Lasix were initiated on 6/29.   He continues to receive aggressive hydration at 150 cc/hr   IV Decadron discontinued on 7/2, switched to oral decadron 4 mg q 12 hrs until 7/4, discontinued since.  Levels have normalized as of 7/2, today at 7.4  3.Tobacco abuse  Counseling was provided, but he refuses nicotine patch at this time  4.Leukocytosis, resolved Likely due to infected necrotic tumor and dexamethasone.  He is afebrile. On   IV antibiotics with Zosyn day  6.  WBC today normal at 9.3  5.Thrombocytosis, resolved Likely due to infected necrotic tumor.  Improving, labs this morning are pending. Continue to monitor  6. Anemia Likely due to malignancy,infection, serosanguineous drainage from left neck mass, hydration, blood draws, antibiotics and malnutrition.  Stable today, with a Hb 11.1  No transfusion indicated at this time.   7.Neck pain  Secondary to cancer Continue pain medicine as needed. Current regimen adequate  8.DVT prophylaxis  on Lovenox and SCDs  9.Chronic constipation  Exacerbated by malignant hypercalcemia, decreased mobility, pain medications and dehydration. No bowel movement since admission. On Miralax daily (last dose 7/5),Senokot bid and Milk of Magnesium with stool softeners.  Lactulose was added on 7/2 with resolution of symptoms   10. Severe Malnutrition due to large left neck mass, dysphagia On Ensure bid as per Nutritionist recommendation.  Patient's appetite has improved. Diet was advanced to increased consistence in the setting of Dysphagia 3  11.Hypokalemia In the setting of diuretics Received 4  Runs of KCL as per primary team on 7/1.  Resolved since 7/2  12. Altered mental status Due to hypercalcemia. Urine negative for nitrites and leukocytes Resolved.  13.  PT/OT  to help patient mobilize with assistance  14. Draining necrotic neck tumor Continue wound care and antibiotics as needed  15. Discharge planning Chemo will be completed by Monday 7/6. Plan for discharge once last dose of chemo received and tolerated.   CODE STATUS  Full code      **Disclaimer: This note was dictated with voice recognition software. Similar sounding words can inadvertently be transcribed and this note may contain transcription errors which may not have been corrected upon publication of note.Sharene Butters E, PA-C 11/01/2013  7:25 AM Lamaya Hyneman, MD 11/01/2013

## 2013-11-01 NOTE — Telephone Encounter (Signed)
Per staff message from desk RN I have scheduled injection

## 2013-11-01 NOTE — Progress Notes (Signed)
Patient discharged home, all discharge medications and instructions reviewed and questions answered.  patient to be assisted to vehicle by wheelchair.  Patient discharged with R double lumen picc in place per MD order, picc flushed peer protocol and to be managed via cancer center after discharge.

## 2013-11-01 NOTE — Discharge Summary (Signed)
Physician Discharge Summary  Angel [name] BCW:[id number] DOB: May 16, 1950 DOA: 10/25/2013  PCP: [name], MD  Admit date: 10/25/2013 Discharge date: 11/01/2013   Recommendations for Outpatient Follow-Up:   1. F/U arranged with Dr. Alvy Bimler who will follow up on labs.   Discharge Diagnosis:   Principal Problem:    Hypercalcemia of malignancy Active Problems:    Cancer of base of tongue    Tobacco abuse    Anemia in neoplastic disease    Weight loss    Neck pain    Leukocytosis, unspecified    Hypokalemia    Protein-calorie malnutrition, severe    Generalized weakness    Fall    Altered mental status    Thrombocytosis    Chronic constipation    Dysphasia   Discharge Condition: Improved.  Diet recommendation: Regular as tolerated with Ensure supplements.   History of Present Illness:   Angel [name] is an 63 y.o. 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 Upon initial evaluation in the ED, his calcium levels found to be 15. Chemotherapy with Taxotere, cis-platinum, and 5-FU initiated.  Hospital Course by Problem:   Principal Problem:  Hypercalcemia of malignancy with generalized weakness/fall / altered mental status  Status post IV Decadron and pamidronate. He is also received IV fluids/Lasix.  Calcium level normalized 10/28/13.   Active Problems:  Cancer of base of tongue with regional lymph node metastasis/neck pain  Induction chemotherapy with Taxotere, cis-platinum, and 5-FU started per oncologist. 5-FU should finished up 11/01/13.  Radiation therapy will be initiated when cleared by dentistry/oncology.  Continue morphine to q. 2 hours when necessary for pain control (discharged on Roxanol).  Tobacco abuse  Counseled. Refused nicotine patch.  Leukocytosis, unspecified  Thought to be from tumor necrosis and effects of  dexamethasone.  Treated with 8 days of empiric Zosyn. WBC normalized at discharge.  Chronic constipation  Secondary to hypercalcemia and medication side effects.  Continue aggressive bowel regimen.  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 No indication for transfusion during this hospital stay.  Thrombocytosis  Likely reactive secondary to infected necrotic tumor.  Hypokalemia  Replaced. Magnesium WNL.  Protein-calorie malnutrition, severe / weight loss / dysphagia  Continue Ensure nutritional supplements.   Procedures:    None.   Medical Consultants:    Dr. [name], Oncology.   Discharge Exam:   Filed Vitals:   11/01/13 0706  BP: 108/78  Pulse: 98  Temp:   Resp: 18   Filed Vitals:   10/31/13 1422 10/31/13 2045 11/01/13 0618 11/01/13 0706  BP: 136/83 127/94 134/85 108/78  Pulse: 102 90 99 98  Temp: 98.5 F (36.9 C) 98.5 F (36.9 C) 98 F (36.7 C)   TempSrc: Oral Oral Oral   Resp: 18 18 16 18   Height:      Weight:      SpO2: 99% 100% 93%     Gen:  NAD Neck: Large left neck mass Cardiovascular:  RRR, No M/R/G Respiratory: Lungs CTAB Gastrointestinal: Abdomen soft, NT/ND with normal active bowel sounds. Extremities: No C/E/C    Discharge Instructions:   Discharge Instructions   Change dressing (specify)    Complete by:  As directed   Change neck dressings as needed for soilage.  Can use a towel to absorb drainage.     Diet general    Complete by:  As directed  Diet as tolerated.  Try to drink at least 3 Ensure supplements a day.     Discharge instructions    Complete by:  As directed   You were cared for by Dr. Jacquelynn Cree  (a hospitalist) during your hospital stay. If you have any questions about your discharge medications or the care you received while you were in the hospital after you are discharged, you can call the unit and ask to speak with the hospitalist on call if the hospitalist that took care of you is  not available. Once you are discharged, your primary care physician will handle any further medical issues. Please note that NO REFILLS for any discharge medications will be authorized once you are discharged, as it is imperative that you return to your primary care physician (or establish a relationship with a primary care physician if you do not have one) for your aftercare needs so that they can reassess your need for medications and monitor your lab values.  Any outstanding tests can be reviewed by your PCP at your follow up visit.  It is also important to review any medicine changes with your PCP.  Please bring these d/c instructions with you to your next visit so your physician can review these changes with you.     Increase activity slowly    Complete by:  As directed      PHYSICIAN COMMUNICATION ORDER    Complete by:  As directed   A baseline Audiogram is recommended prior to initiation of cisplatin chemotherapy.     TREATMENT CONDITIONS    Complete by:  As directed   Notify the MD for the following lab values: ANC < 1500, PLT < 100K, Hemoglobin < 8.5, Creatinine > 1.5, urine output < 200 ml prior to cisplatin.  If labs are abnormal OR no lab data is available, MD must be notified and order obtained to begin chemotherapy.            Medication List    STOP taking these medications       morphine 15 MG tablet  Commonly known as:  MSIR  Replaced by:  morphine CONCENTRATE 10 mg / 0.5 ml concentrated solution      TAKE these medications       acetaminophen 325 MG tablet  Commonly known as:  TYLENOL  Take 650 mg by mouth every 6 (six) hours as needed for mild pain or moderate pain.     antiseptic oral rinse Liqd  15 mLs by Mouth Rinse route 2 (two) times daily.     chlorhexidine 0.12 % solution  Commonly known as:  PERIDEX  Rinse with 15 mls twice daily for 30 seconds. Use after breakfast and at bedtime. Spit out excess. Do not swallow.     dexamethasone 4 MG tablet  Commonly  known as:  DECADRON  Start the day before Taxotere. Take once the day after, then 2 times a day x 2 days. Repeat in 3 weeks     lactulose 10 GM/15ML solution  Commonly known as:  CHRONULAC  Take 15 mLs (10 g total) by mouth 2 (two) times daily.     morphine CONCENTRATE 10 mg / 0.5 ml concentrated solution  Take 0.25 mLs (5 mg total) by mouth every 2 (two) hours as needed for severe pain.     ondansetron 8 MG tablet  Commonly known as:  ZOFRAN  Take 1 tablet (8 mg total) by mouth every 8 (eight) hours as needed for  nausea or vomiting (Nausea or vomiting).     pantoprazole 40 MG tablet  Commonly known as:  PROTONIX  Take 1 tablet (40 mg total) by mouth daily.     polyethylene glycol packet  Commonly known as:  MIRALAX / GLYCOLAX  Take 17 g by mouth daily.     prochlorperazine 10 MG tablet  Commonly known as:  COMPAZINE  Take 1 tablet (10 mg total) by mouth every 6 (six) hours as needed (Nausea or vomiting).     senna-docusate 8.6-50 MG per tablet  Commonly known as:  Senokot-S  Take 2 tablets by mouth 2 (two) times daily.           Follow-up Information   Follow up with [facility], NI, MD. (At your scheduled appointment time.)    Specialty:  Hematology and Oncology   Contact information:   [place] [id number] [phone]        The results of significant diagnostics from this hospitalization (including imaging, microbiology, ancillary and laboratory) are listed below for reference.     Significant Diagnostic Studies:   Radiographs: Nm Pet Image Initial (pi) Skull Base To Thigh  10/15/2013   CLINICAL DATA:  Initial treatment strategy for tongue cancer.  EXAM: NUCLEAR MEDICINE PET SKULL BASE TO THIGH  TECHNIQUE: 6.4 mCi F-18 FDG was injected intravenously. Full-ring PET imaging was performed from the skull base to thigh after the radiotracer. CT data was obtained and used for attenuation correction and anatomic localization.  FASTING BLOOD GLUCOSE:   Value: 147 mg/dl  COMPARISON:  None.  FINDINGS: NECK  Large base of tongue mass measures 4.7 cm and has an SUV max equal 14.6. Large, necrotic lymph node within the left side of neck measures 8.1 x 7.8 cm and has an SUV max equal to 14.5. Hypermetabolic right level 2 lymph node measures 2.3 cm and has an SUV max equal to 10. There is a small right-sided level 4 lymph node which measures 8 mm and has an SUV max equal to 2.8.  CHEST  No hypermetabolic mediastinal or hilar nodes. No suspicious pulmonary nodules on the CT scan. Clustered nodules within the posterior right upper lobe are identified, image 23/series 7. There is associated cylindrical type bronchiectasis. Mild changes of centrilobular emphysema noted. The heart size is normal. No pericardial effusion. There is mild calcified atherosclerotic disease involving the thoracic aorta. Calcification involving the LAD coronary artery noted.  ABDOMEN/PELVIS  No abnormal hypermetabolic activity within the liver, pancreas, adrenal glands, or spleen. No hypermetabolic lymph nodes in the abdomen or pelvis.  SKELETON  No focal hypermetabolic activity to suggest skeletal metastasis.  IMPRESSION: 1. Examination is positive for large, hypermetabolic base of tongue mass. 2. Bilateral cervical lymph node metastasis. 3. No evidence for hypermetabolic metastasis to the chest abdomen or pelvis. 4. Postinflammatory/infectious changes within the posterior right upper lobe. 5. Atherosclerotic disease including multi vessel coronary artery calcifications.   Electronically Signed   By: Kerby Moors M.D.   On: 10/15/2013 09:53   Ir Fluoro Guide Cv Line Right  10/26/2013   CLINICAL DATA:  History of squamous cell cancer of the oropharynx; central venous access is requested for chemotherapy.  EXAM: IR RIGHT FLOURO GUIDE CV LINE; IR ULTRASOUND GUIDANCE VASC ACCESS RIGHT  FLUOROSCOPY TIME:  24 seconds  TECHNIQUE: The right arm was prepped with chlorhexidine, draped in the usual  sterile fashion using maximum barrier technique (cap and mask, sterile gown, sterile gloves, large sterile sheet, hand hygiene  and cutaneous antiseptic). Local anesthesia was attained by infiltration with 1% lidocaine.  Ultrasound demonstrated patency of the right brachial vein, and this was documented with an image. Under real-time ultrasound guidance, this vein was accessed with a 21 gauge micropuncture needle and image documentation was performed. The needle was exchanged over a guidewire for a peel-away sheath through which a 39 cm 5 [place] double lumen power injectable PICC was advanced, and positioned with its tip at the lower SVC/right atrial junction. Fluoroscopy during the procedure and fluoro spot radiograph confirms appropriate catheter position. The catheter was flushed, secured to the skin with Prolene sutures, and covered with a sterile dressing.  COMPLICATIONS: None.  The patient tolerated the procedure well.  IMPRESSION: Successful placement of a right arm PICC with sonographic and fluoroscopic guidance. The catheter is ready for use.  Read by: Rowe Robert ,P.A.-C.   Electronically Signed   By: Aletta Edouard M.D.   On: 10/26/2013 14:14   Ir US Guide Vasc Access Right  10/26/2013   CLINICAL DATA:  History of squamous cell cancer of the oropharynx; central venous access is requested for chemotherapy.  EXAM: IR RIGHT FLOURO GUIDE CV LINE; IR ULTRASOUND GUIDANCE VASC ACCESS RIGHT  FLUOROSCOPY TIME:  24 seconds  TECHNIQUE: The right arm was prepped with chlorhexidine, draped in the usual sterile fashion using maximum barrier technique (cap and mask, sterile gown, sterile gloves, large sterile sheet, hand hygiene and cutaneous antiseptic). Local anesthesia was attained by infiltration with 1% lidocaine.  Ultrasound demonstrated patency of the right brachial vein, and this was documented with an image. Under real-time ultrasound guidance, this vein was accessed with a 21 gauge micropuncture needle and  image documentation was performed. The needle was exchanged over a guidewire for a peel-away sheath through which a 39 cm 5 [place] double lumen power injectable PICC was advanced, and positioned with its tip at the lower SVC/right atrial junction. Fluoroscopy during the procedure and fluoro spot radiograph confirms appropriate catheter position. The catheter was flushed, secured to the skin with Prolene sutures, and covered with a sterile dressing.  COMPLICATIONS: None.  The patient tolerated the procedure well.  IMPRESSION: Successful placement of a right arm PICC with sonographic and fluoroscopic guidance. The catheter is ready for use.  Read by: Rowe Robert ,P.A.-C.   Electronically Signed   By: Aletta Edouard M.D.   On: 10/26/2013 14:14    Labs:  Basic Metabolic Panel:  Recent Labs Lab 10/25/13 1529 10/26/13 0415  10/28/13 0515 10/29/13 0319 10/30/13 0600 10/31/13 0545 11/01/13 0630  NA 138 138  < > 136* 136* 133* 134* 131*  K 3.1* 3.3*  < > 3.3* 3.8 4.1 3.7 3.9  CL 97 96  < > 100 100 97 96 96  CO2 31 32  < > 26 27 27 25 23   GLUCOSE 113* 138*  < > 131* 119* 93 102* 113*  BUN 13 15  < > 17 14 11 9 8   CREATININE 0.70 0.82  < > 0.63 0.61 0.63 0.60 0.58  CALCIUM 13.4* 13.0*  < > 9.2 8.1* 7.7* 7.5* 7.4*  MG  --  1.9  --  2.3  --   --   --   --   < > = values in this interval not displayed. GFR Estimated Creatinine Clearance: 64.5 ml/min (by C-G formula based on Cr of 0.58). Liver Function Tests:  Recent Labs Lab 10/26/13 0415 10/28/13 0515  AST 11 15  ALT 8 12  ALKPHOS  63 50  BILITOT 0.3 0.3  PROT 6.4 5.4*  ALBUMIN 2.8* 2.3*    CBC:  Recent Labs Lab 10/28/13 0515 10/29/13 0319 10/30/13 0600 10/31/13 0545 11/01/13 0630  WBC 27.8* 34.1* 30.9* 20.1* 9.3  HGB 10.8* 11.1* 11.5* 11.4* 11.1*  HCT 32.7* 32.3* 34.0* 33.6* 32.1*  MCV 97.9 96.1 96.9 96.0 95.0  PLT 503* 476* 379 298 268    Time coordinating discharge: 35 minutes.  Signed:  RAMA,CHRISTINA  Pager  [phone] [facility] 11/01/2013, 11:03 AM

## 2013-11-02 ENCOUNTER — Ambulatory Visit: Payer: Medicaid Other

## 2013-11-02 ENCOUNTER — Other Ambulatory Visit: Payer: Self-pay | Admitting: Hematology and Oncology

## 2013-11-02 ENCOUNTER — Telehealth: Payer: Self-pay | Admitting: *Deleted

## 2013-11-02 ENCOUNTER — Ambulatory Visit (HOSPITAL_BASED_OUTPATIENT_CLINIC_OR_DEPARTMENT_OTHER): Payer: Medicaid Other

## 2013-11-02 ENCOUNTER — Ambulatory Visit: Payer: Self-pay

## 2013-11-02 VITALS — BP 103/71 | HR 107 | Temp 97.7°F

## 2013-11-02 DIAGNOSIS — Z5189 Encounter for other specified aftercare: Secondary | ICD-10-CM

## 2013-11-02 DIAGNOSIS — Z452 Encounter for adjustment and management of vascular access device: Secondary | ICD-10-CM

## 2013-11-02 DIAGNOSIS — R634 Abnormal weight loss: Secondary | ICD-10-CM

## 2013-11-02 DIAGNOSIS — C01 Malignant neoplasm of base of tongue: Secondary | ICD-10-CM

## 2013-11-02 MED ORDER — SODIUM CHLORIDE 0.9 % IJ SOLN
10.0000 mL | INTRAMUSCULAR | Status: DC | PRN
  Filled 2013-11-02: qty 10

## 2013-11-02 MED ORDER — PEGFILGRASTIM INJECTION 6 MG/0.6ML
6.0000 mg | Freq: Once | SUBCUTANEOUS | Status: AC
  Administered 2013-11-02: 6 mg via SUBCUTANEOUS
  Filled 2013-11-02: qty 0.6

## 2013-11-02 MED ORDER — HEPARIN SOD (PORK) LOCK FLUSH 100 UNIT/ML IV SOLN
500.0000 [IU] | Freq: Once | INTRAVENOUS | Status: DC
  Filled 2013-11-02: qty 5

## 2013-11-02 NOTE — Patient Instructions (Signed)

## 2013-11-02 NOTE — Patient Instructions (Signed)
PICC Home Guide A peripherally inserted central catheter (PICC) is a long, thin, flexible tube that is inserted into a vein in the upper arm. It is a form of intravenous (IV) access. It is considered to be a "central" line because the tip of the PICC ends in a large vein in your chest. This large vein is called the superior vena cava (SVC). The PICC tip ends in the SVC because there is a lot of blood flow in the SVC. This allows medicines and IV fluids to be quickly distributed throughout the body. The PICC is inserted using a sterile technique by a specially trained nurse or physician. After the PICC is inserted, a chest X-ray exam is done to be sure it is in the correct place.  A PICC may be placed for different reasons, such as:  To give medicines and liquid nutrition that can only be given through a central line. Examples are:  Certain antibiotic treatments.  Chemotherapy.  Total parenteral nutrition (TPN).  To take frequent blood samples.  To give IV fluids and blood products.  If there is difficulty placing a peripheral intravenous (PIV) catheter. If taken care of properly, a PICC can remain in place for several months. A PICC can also allow a person to go home from the hospital early. Medicine and PICC care can be managed at home by a family member or home health care team. WHAT PROBLEMS CAN HAPPEN WHEN I HAVE A PICC? Problems with a PICC can occasionally occur. These may include:  A blood clot (thrombus) forming in or at the tip of the PICC. This can cause the PICC to become clogged. A clot-dissolving medicine called tissue plasminogen activator (tPA) can be given through the PICC to help break up the clot.  Inflammation of the vein (phlebitis) in which the PICC is placed. Signs of inflammation may include redness, pain at the insertion site, red streaks, or being able to feel a "cord" in the vein where the PICC is located.  Infection in the PICC or at the insertion site. Signs of  infection may include fever, chills, redness, swelling, or pus drainage from the PICC insertion site.  PICC movement (malposition). The PICC tip may move from its original position due to excessive physical activity, forceful coughing, sneezing, or vomiting.  A break or cut in the PICC. It is important to not use scissors near the PICC.  Nerve or tendon irritation or injury during PICC insertion. WHAT SHOULD I KEEP IN MIND ABOUT ACTIVITIES WHEN I HAVE A PICC?  You may bend your arm and move it freely. If your PICC is near or at the bend of your elbow, avoid activity with repeated motion at the elbow.  Rest at home for the remainder of the day following PICC line insertion.  Avoid lifting heavy objects as instructed by your health care provider.  Avoid using a crutch with the arm on the same side as your PICC. You may need to use a walker. WHAT SHOULD I KNOW ABOUT MY PICC DRESSING?  Keep your PICC bandage (dressing) clean and dry to prevent infection.  Ask your health care provider when you may shower. Ask your health care provider to teach you how to wrap the PICC when you do take a shower.  Change the PICC dressing as instructed by your health care provider.  Change your PICC dressing if it becomes loose or wet. WHAT SHOULD I KNOW ABOUT PICC CARE?  Check the PICC insertion site daily for   leakage, redness, swelling, or pain.  Do not take a bath, swim, or use hot tubs when you have a PICC. Cover PICC line with clear plastic wrap and tape to keep it dry while showering.  Flush the PICC as directed by your health care provider. Let your health care provider know right away if the PICC is difficult to flush or does not flush. Do not use force to flush the PICC.  Do not use a syringe that is less than 10 mL to flush the PICC.  Never pull or tug on the PICC.  Avoid blood pressure checks on the arm with the PICC.  Keep your PICC identification card with you at all times.  Do not  take the PICC out yourself. Only a trained clinical professional should remove the PICC. SEEK IMMEDIATE MEDICAL CARE IF:  Your PICC is accidently pulled all the way out. If this happens, cover the insertion site with a bandage or gauze dressing. Do not throw the PICC away. Your health care provider will need to inspect it.  Your PICC was tugged or pulled and has partially come out. Do not  push the PICC back in.  There is any type of drainage, redness, or swelling where the PICC enters the skin.  You cannot flush the PICC, it is difficult to flush, or the PICC leaks around the insertion site when it is flushed.  You hear a "flushing" sound when the PICC is flushed.  You have pain, discomfort, or numbness in your arm, shoulder, or jaw on the same side as the PICC.  You feel your heart "racing" or skipping beats.  You notice a hole or tear in the PICC.  You develop chills or a fever. MAKE SURE YOU:   Understand these instructions.  Will watch your condition.  Will get help right away if you are not doing well or get worse. Document Released: 10/20/2002 Document Revised: 02/03/2013 Document Reviewed: 12/21/2012 Exit[employer] Patient Information 2015 [employer]. This information is not intended to replace advice given to you by your health care provider. Make sure you discuss any questions you have with your health care provider.  

## 2013-11-02 NOTE — Telephone Encounter (Signed)
Per staff message and POF I have scheduled appts. Left patient message with appt. JMW

## 2013-11-03 ENCOUNTER — Ambulatory Visit: Payer: Self-pay

## 2013-11-03 ENCOUNTER — Encounter (HOSPITAL_COMMUNITY): Payer: Self-pay | Admitting: Emergency Medicine

## 2013-11-03 ENCOUNTER — Emergency Department (HOSPITAL_COMMUNITY): Payer: Medicaid Other

## 2013-11-03 ENCOUNTER — Inpatient Hospital Stay (HOSPITAL_COMMUNITY)
Admission: EM | Admit: 2013-11-03 | Discharge: 2013-11-07 | DRG: 808 | Disposition: A | Discharge status: Home Health | Payer: Medicaid Other | Attending: Internal Medicine | Admitting: Internal Medicine

## 2013-11-03 DIAGNOSIS — E41 Nutritional marasmus: Secondary | ICD-10-CM | POA: Diagnosis present

## 2013-11-03 DIAGNOSIS — D630 Anemia in neoplastic disease: Secondary | ICD-10-CM

## 2013-11-03 DIAGNOSIS — D702 Other drug-induced agranulocytosis: Principal | ICD-10-CM | POA: Diagnosis present

## 2013-11-03 DIAGNOSIS — F17200 Nicotine dependence, unspecified, uncomplicated: Secondary | ICD-10-CM | POA: Diagnosis present

## 2013-11-03 DIAGNOSIS — E43 Unspecified severe protein-calorie malnutrition: Secondary | ICD-10-CM | POA: Diagnosis present

## 2013-11-03 DIAGNOSIS — E860 Dehydration: Secondary | ICD-10-CM

## 2013-11-03 DIAGNOSIS — R4702 Dysphasia: Secondary | ICD-10-CM

## 2013-11-03 DIAGNOSIS — K5289 Other specified noninfective gastroenteritis and colitis: Secondary | ICD-10-CM

## 2013-11-03 DIAGNOSIS — R634 Abnormal weight loss: Secondary | ICD-10-CM

## 2013-11-03 DIAGNOSIS — R197 Diarrhea, unspecified: Secondary | ICD-10-CM | POA: Diagnosis not present

## 2013-11-03 DIAGNOSIS — E871 Hypo-osmolality and hyponatremia: Secondary | ICD-10-CM | POA: Diagnosis present

## 2013-11-03 DIAGNOSIS — R5081 Fever presenting with conditions classified elsewhere: Secondary | ICD-10-CM | POA: Diagnosis present

## 2013-11-03 DIAGNOSIS — R6510 Systemic inflammatory response syndrome (SIRS) of non-infectious origin without acute organ dysfunction: Secondary | ICD-10-CM | POA: Diagnosis present

## 2013-11-03 DIAGNOSIS — K529 Noninfective gastroenteritis and colitis, unspecified: Secondary | ICD-10-CM

## 2013-11-03 DIAGNOSIS — D75839 Thrombocytosis, unspecified: Secondary | ICD-10-CM

## 2013-11-03 DIAGNOSIS — Z681 Body mass index (BMI) 19 or less, adult: Secondary | ICD-10-CM | POA: Diagnosis not present

## 2013-11-03 DIAGNOSIS — T50995A Adverse effect of other drugs, medicaments and biological substances, initial encounter: Secondary | ICD-10-CM | POA: Diagnosis present

## 2013-11-03 DIAGNOSIS — K5909 Other constipation: Secondary | ICD-10-CM

## 2013-11-03 DIAGNOSIS — D72829 Elevated white blood cell count, unspecified: Secondary | ICD-10-CM

## 2013-11-03 DIAGNOSIS — C779 Secondary and unspecified malignant neoplasm of lymph node, unspecified: Secondary | ICD-10-CM | POA: Diagnosis present

## 2013-11-03 DIAGNOSIS — E876 Hypokalemia: Secondary | ICD-10-CM

## 2013-11-03 DIAGNOSIS — C01 Malignant neoplasm of base of tongue: Secondary | ICD-10-CM | POA: Diagnosis present

## 2013-11-03 DIAGNOSIS — M542 Cervicalgia: Secondary | ICD-10-CM

## 2013-11-03 DIAGNOSIS — Z720 Tobacco use: Secondary | ICD-10-CM

## 2013-11-03 DIAGNOSIS — R531 Weakness: Secondary | ICD-10-CM

## 2013-11-03 DIAGNOSIS — D709 Neutropenia, unspecified: Secondary | ICD-10-CM | POA: Diagnosis present

## 2013-11-03 DIAGNOSIS — D473 Essential (hemorrhagic) thrombocythemia: Secondary | ICD-10-CM

## 2013-11-03 LAB — URINE MICROSCOPIC-ADD ON

## 2013-11-03 LAB — COMPREHENSIVE METABOLIC PANEL
ALT: 9 U/L (ref 0–53)
AST: 7 U/L (ref 0–37)
Albumin: 2.5 g/dL — ABNORMAL LOW (ref 3.5–5.2)
Alkaline Phosphatase: 53 U/L (ref 39–117)
Anion gap: 14 (ref 5–15)
BUN: 12 mg/dL (ref 6–23)
CALCIUM: 8.1 mg/dL — AB (ref 8.4–10.5)
CO2: 22 meq/L (ref 19–32)
Chloride: 89 mEq/L — ABNORMAL LOW (ref 96–112)
Creatinine, Ser: 0.7 mg/dL (ref 0.50–1.35)
GLUCOSE: 131 mg/dL — AB (ref 70–99)
Potassium: 3.6 mEq/L — ABNORMAL LOW (ref 3.7–5.3)
Sodium: 125 mEq/L — ABNORMAL LOW (ref 137–147)
Total Bilirubin: 0.6 mg/dL (ref 0.3–1.2)
Total Protein: 5.6 g/dL — ABNORMAL LOW (ref 6.0–8.3)

## 2013-11-03 LAB — CBC
HCT: 31 % — ABNORMAL LOW (ref 39.0–52.0)
Hemoglobin: 10.5 g/dL — ABNORMAL LOW (ref 13.0–17.0)
MCH: 31.9 pg (ref 26.0–34.0)
MCHC: 33.9 g/dL (ref 30.0–36.0)
MCV: 94.2 fL (ref 78.0–100.0)
PLATELETS: 164 10*3/uL (ref 150–400)
RBC: 3.29 MIL/uL — ABNORMAL LOW (ref 4.22–5.81)
RDW: 14 % (ref 11.5–15.5)
WBC: 0.9 10*3/uL — AB (ref 4.0–10.5)

## 2013-11-03 LAB — DIFFERENTIAL
BASOS ABS: 0 10*3/uL (ref 0.0–0.1)
BASOS PCT: 0 % (ref 0–1)
EOS ABS: 0 10*3/uL (ref 0.0–0.7)
Eosinophils Relative: 1 % (ref 0–5)
Lymphocytes Relative: 67 % — ABNORMAL HIGH (ref 12–46)
Lymphs Abs: 0.7 10*3/uL (ref 0.7–4.0)
MONO ABS: 0.1 10*3/uL (ref 0.1–1.0)
Monocytes Relative: 16 % — ABNORMAL HIGH (ref 3–12)
NEUTROS PCT: 16 % — AB (ref 43–77)
Neutro Abs: 0.1 10*3/uL — ABNORMAL LOW (ref 1.7–7.7)

## 2013-11-03 LAB — URINALYSIS, ROUTINE W REFLEX MICROSCOPIC
Bilirubin Urine: NEGATIVE
Glucose, UA: 100 mg/dL — AB
Hgb urine dipstick: NEGATIVE
Ketones, ur: NEGATIVE mg/dL
LEUKOCYTES UA: NEGATIVE
NITRITE: NEGATIVE
PROTEIN: 30 mg/dL — AB
SPECIFIC GRAVITY, URINE: 1.017 (ref 1.005–1.030)
UROBILINOGEN UA: 0.2 mg/dL (ref 0.0–1.0)
pH: 6 (ref 5.0–8.0)

## 2013-11-03 LAB — I-STAT CG4 LACTIC ACID, ED: LACTIC ACID, VENOUS: 1.15 mmol/L (ref 0.5–2.2)

## 2013-11-03 LAB — LIPASE, BLOOD: LIPASE: 14 U/L (ref 11–59)

## 2013-11-03 MED ORDER — VANCOMYCIN HCL IN DEXTROSE 1-5 GM/200ML-% IV SOLN
1000.0000 mg | Freq: Once | INTRAVENOUS | Status: AC
  Administered 2013-11-03: 1000 mg via INTRAVENOUS
  Filled 2013-11-03: qty 200

## 2013-11-03 MED ORDER — ACETAMINOPHEN 325 MG PO TABS
650.0000 mg | ORAL_TABLET | Freq: Once | ORAL | Status: AC
  Administered 2013-11-03: 650 mg via ORAL
  Filled 2013-11-03: qty 2

## 2013-11-03 MED ORDER — SODIUM CHLORIDE 0.9 % IV BOLUS (SEPSIS)
1000.0000 mL | Freq: Once | INTRAVENOUS | Status: AC
  Administered 2013-11-03: 1000 mL via INTRAVENOUS

## 2013-11-03 MED ORDER — CEFEPIME HCL 2 G IJ SOLR
2.0000 g | Freq: Once | INTRAMUSCULAR | Status: AC
  Administered 2013-11-04: 2 g via INTRAVENOUS
  Filled 2013-11-03: qty 2

## 2013-11-03 NOTE — ED Provider Notes (Signed)
CSN: [id number]     Arrival date & time 11/03/13  2050 History   First MD Initiated Contact with Patient 11/03/13 2054     Chief Complaint  Patient presents with  . Fever  . Nausea     (Consider location/radiation/quality/duration/timing/severity/associated sxs/prior Treatment) Patient is a 63 y.o. male presenting with fever. The history is provided by the patient.  Fever Max temp prior to arrival:  100.1 Temp source:  Oral Severity:  Mild Onset quality:  Sudden Timing:  Constant Progression:  Unchanged Chronicity:  New Relieved by:  Nothing Worsened by:  Nothing tried Associated symptoms: diarrhea and nausea   Associated symptoms: no chest pain, no chills, no cough and no vomiting     Past Medical History  Diagnosis Date  . Constipation due to pain medication   . Malignant neoplasm of base of tongue     left neck lymph and Left BOT cancer  . Current smoker   . 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 Testicle surgery      as a infant  . Appendectomy    . Mandible surgery      from [place]  . Back surgery      Lumbar  . Tonsillectomy    . Panendoscopy N/A 10/04/2013    Procedure: Direct Laryngoscopy  WITH BIOPSY;  Surgeon: Melida Quitter, MD;  Location: [facility];  Service: ENT;  Laterality: N/A;  . Esophagoscopy  10/04/2013    Procedure: ESOPHAGOSCOPY;  Surgeon: Melida Quitter, MD;  Location: [place];  Service: ENT;;  . Multiple extractions with alveoloplasty N/A 10/08/2013    Procedure: extraction of tooth #'s 2,3,4,5,6,11,12,13,14,15,18,19,20,21,22,23,24,25,26, 27,28, 29, 31 with alveoloplasty ;  Surgeon: Lenn Cal, DDS;  Location: [facility];  Service: Oral Surgery;  Laterality: N/A;   Family History  Problem Relation Age of Onset  . Cancer Mother     Lung  . Cancer Father     ? Lung   History  Substance Use Topics  . Smoking status: Heavy Tobacco  Smoker -- 2.00 packs/day for 45 years    Types: Cigarettes  . Smokeless tobacco: Never Used  . Alcohol Use: No     Comment: "Heavy" Alcohol Use in the Past    Review of Systems  Constitutional: Positive for fever. Negative for chills.  Respiratory: Negative for cough and shortness of breath.   Cardiovascular: Negative for chest pain and leg swelling.  Gastrointestinal: Positive for nausea, abdominal pain and diarrhea. Negative for vomiting.  All other systems reviewed and are negative.     Allergies  Review of patient's allergies indicates no known allergies.  Home Medications   Prior to Admission medications   Medication Sig Start Date End Date Taking? Authorizing Provider  acetaminophen (TYLENOL) 325 MG tablet Take 650 mg by mouth every 6 (six) hours as needed for mild pain or moderate pain.    Historical Provider, MD  antiseptic oral rinse (BIOTENE) LIQD 15 mLs by Mouth Rinse route 2 (two) times daily. 11/01/13   Venetia Maxon Rama, MD  chlorhexidine (PERIDEX) 0.12 % solution Rinse with 15 mls twice daily for 30 seconds. Use after breakfast and at bedtime. Spit out excess. Do not swallow. 10/18/13   Lenn Cal, DDS  dexamethasone (DECADRON) 4 MG tablet Start the day before Taxotere. Take once the day after, then 2 times a day x  2 days. Repeat in 3 weeks 10/15/13   [name], MD  lactulose (CHRONULAC) 10 GM/15ML solution Take 15 mLs (10 g total) by mouth 2 (two) times daily. 11/01/13   Venetia Maxon Rama, MD  Morphine Sulfate (MORPHINE CONCENTRATE) 10 mg / 0.5 ml concentrated solution Take 0.25 mLs (5 mg total) by mouth every 2 (two) hours as needed for severe pain. 11/01/13   Christina P Rama, MD  ondansetron (ZOFRAN) 8 MG tablet Take 1 tablet (8 mg total) by mouth every 8 (eight) hours as needed for nausea or vomiting (Nausea or vomiting). 10/15/13   [name], MD  pantoprazole (PROTONIX) 40 MG tablet Take 1 tablet (40 mg total) by mouth daily. 11/01/13   Venetia Maxon Rama, MD    polyethylene glycol (MIRALAX / GLYCOLAX) packet Take 17 g by mouth daily. 11/01/13   Venetia Maxon Rama, MD  prochlorperazine (COMPAZINE) 10 MG tablet Take 1 tablet (10 mg total) by mouth every 6 (six) hours as needed (Nausea or vomiting). 10/15/13   [name], MD  senna-docusate (SENOKOT-S) 8.6-50 MG per tablet Take 2 tablets by mouth 2 (two) times daily. 11/01/13   Christina P Rama, MD   BP 108/78  Pulse 114  Temp(Src) 100.1 F (37.8 C) (Oral)  Resp 21  SpO2 94% Physical Exam  Nursing note and vitals reviewed. Constitutional: He is oriented to person, place, and time. He appears well-developed and well-nourished. No distress.  HENT:  Head: Normocephalic and atraumatic.  Mouth/Throat: No oropharyngeal exudate.  Eyes: EOM are normal. Pupils are equal, round, and reactive to light.  Neck: Normal range of motion. Neck supple. No tracheal deviation present.    Cardiovascular: Regular rhythm.  Tachycardia present.  Exam reveals no friction rub.   No murmur heard. Pulmonary/Chest: Effort normal and breath sounds normal. No respiratory distress. He has no wheezes. He has no rales.  Abdominal: He exhibits no distension. There is tenderness (mild, diffuse). There is no rebound.  Musculoskeletal: Normal range of motion. He exhibits no edema.  Lymphadenopathy:    He has no cervical adenopathy.  Neurological: He is alert and oriented to person, place, and time.  Skin: He is not diaphoretic.    ED Course  Procedures (including critical care time) Labs Review Labs Reviewed  CLOSTRIDIUM DIFFICILE BY PCR  CULTURE, BLOOD (ROUTINE X 2)  CULTURE, BLOOD (ROUTINE X 2)  COMPREHENSIVE METABOLIC PANEL  CBC  LIPASE, BLOOD  URINALYSIS, ROUTINE W REFLEX MICROSCOPIC  I-STAT CG4 LACTIC ACID, ED    Imaging Review Dg Chest 2 View  11/03/2013   CLINICAL DATA:  Fever and nausea  EXAM: CHEST  2 VIEW  COMPARISON:  None.  FINDINGS: Shallow inspiration with elevation of left hemidiaphragm. Normal heart size  and pulmonary vascularity. Right PICC catheter with tip over the cavoatrial junction. No focal airspace disease or consolidation in the lungs. No blunting of costophrenic angles. No pneumothorax. Hilar and mediastinal contours appear symmetrical. Prominent soft tissue in the left side of the neck corresponding with known mass.  IMPRESSION: Elevation of left hemidiaphragm. No evidence of active pulmonary disease.   Electronically Signed   By: Lucienne Capers M.D.   On: 11/03/2013 23:27     EKG Interpretation None      MDM   Final diagnoses:  Neutropenic fever  Diarrhea  Dehydration    35M presents with diarrhea. Recent diagnosis of base of tongue cancer after 2-3 months of L sided neck swelling. Was recently discharged from the hospital after 5 days  of chemotherapy initiation.  Presents with diarrhea all day, low grade fever. Also having some abdominal pain - has been having it when he takes the morphine. No prior abdominal pain.  Oncologist is Dr. Alvy Bimler. Patient here with low grade fever, tachycardia, normotensive. L sided neck mass red, has been seen by wound care and is covered with recommended dressing. Lungs clear. Mild diffuse abdominal pain. He is cachectic.  Will check labs, give fluids. Labs show profound neutropenia, also hyponatremia, hypochloremia c/w dehydration. Vanc/Cefepime given. Admitted to medicine.   Osvaldo Shipper, MD 11/03/13 [phone]

## 2013-11-03 NOTE — ED Notes (Signed)
Patient is from home. Patient has hx of throat CA  Dx in May and currently having chemo. Last day of chemo was Monday. Patient states he is having  Constant diarrhea, nausea and fever of 101.0 PTA. Patient wife states the only medications he has taken today was morphine for pain.

## 2013-11-03 NOTE — ED Notes (Signed)
Pt tried to give urine sample but only was able to have a bowel movement

## 2013-11-04 ENCOUNTER — Encounter (HOSPITAL_COMMUNITY): Payer: Self-pay | Admitting: Radiology

## 2013-11-04 ENCOUNTER — Ambulatory Visit: Payer: Self-pay | Admitting: Hematology and Oncology

## 2013-11-04 ENCOUNTER — Ambulatory Visit: Payer: Self-pay

## 2013-11-04 ENCOUNTER — Inpatient Hospital Stay (HOSPITAL_COMMUNITY): Payer: Medicaid Other

## 2013-11-04 ENCOUNTER — Encounter: Payer: Self-pay | Admitting: *Deleted

## 2013-11-04 DIAGNOSIS — C01 Malignant neoplasm of base of tongue: Secondary | ICD-10-CM

## 2013-11-04 DIAGNOSIS — E43 Unspecified severe protein-calorie malnutrition: Secondary | ICD-10-CM

## 2013-11-04 DIAGNOSIS — D638 Anemia in other chronic diseases classified elsewhere: Secondary | ICD-10-CM

## 2013-11-04 DIAGNOSIS — C779 Secondary and unspecified malignant neoplasm of lymph node, unspecified: Secondary | ICD-10-CM

## 2013-11-04 DIAGNOSIS — F17200 Nicotine dependence, unspecified, uncomplicated: Secondary | ICD-10-CM

## 2013-11-04 DIAGNOSIS — R109 Unspecified abdominal pain: Secondary | ICD-10-CM

## 2013-11-04 DIAGNOSIS — D709 Neutropenia, unspecified: Secondary | ICD-10-CM

## 2013-11-04 DIAGNOSIS — E871 Hypo-osmolality and hyponatremia: Secondary | ICD-10-CM

## 2013-11-04 DIAGNOSIS — E860 Dehydration: Secondary | ICD-10-CM

## 2013-11-04 DIAGNOSIS — R110 Nausea: Secondary | ICD-10-CM

## 2013-11-04 DIAGNOSIS — R197 Diarrhea, unspecified: Secondary | ICD-10-CM

## 2013-11-04 DIAGNOSIS — R5081 Fever presenting with conditions classified elsewhere: Secondary | ICD-10-CM

## 2013-11-04 DIAGNOSIS — M542 Cervicalgia: Secondary | ICD-10-CM

## 2013-11-04 LAB — CBC
HCT: 30.9 % — ABNORMAL LOW (ref 39.0–52.0)
Hemoglobin: 10.5 g/dL — ABNORMAL LOW (ref 13.0–17.0)
MCH: 32.2 pg (ref 26.0–34.0)
MCHC: 34 g/dL (ref 30.0–36.0)
MCV: 94.8 fL (ref 78.0–100.0)
PLATELETS: 146 10*3/uL — AB (ref 150–400)
RBC: 3.26 MIL/uL — ABNORMAL LOW (ref 4.22–5.81)
RDW: 14 % (ref 11.5–15.5)
WBC: 1.2 10*3/uL — AB (ref 4.0–10.5)

## 2013-11-04 LAB — BASIC METABOLIC PANEL
ANION GAP: 12 (ref 5–15)
BUN: 11 mg/dL (ref 6–23)
CALCIUM: 7.6 mg/dL — AB (ref 8.4–10.5)
CO2: 23 mEq/L (ref 19–32)
CREATININE: 0.77 mg/dL (ref 0.50–1.35)
Chloride: 93 mEq/L — ABNORMAL LOW (ref 96–112)
Glucose, Bld: 122 mg/dL — ABNORMAL HIGH (ref 70–99)
Potassium: 3.8 mEq/L (ref 3.7–5.3)
Sodium: 128 mEq/L — ABNORMAL LOW (ref 137–147)

## 2013-11-04 LAB — CLOSTRIDIUM DIFFICILE BY PCR: CDIFFPCR: NEGATIVE

## 2013-11-04 MED ORDER — SODIUM CHLORIDE 0.9 % IJ SOLN
10.0000 mL | INTRAMUSCULAR | Status: DC | PRN
  Administered 2013-11-07: 20 mL
  Administered 2013-11-07: 10 mL

## 2013-11-04 MED ORDER — ACETAMINOPHEN 325 MG PO TABS: 650.0000 mg | ORAL_TABLET | Freq: Four times a day (QID) | ORAL | Status: DC | PRN

## 2013-11-04 MED ORDER — LOPERAMIDE HCL 2 MG PO CAPS: 2.0000 mg | ORAL_CAPSULE | ORAL | Status: DC | PRN

## 2013-11-04 MED ORDER — DEXAMETHASONE 4 MG PO TABS
4.0000 mg | ORAL_TABLET | Freq: Two times a day (BID) | ORAL | Status: AC
  Administered 2013-11-04 (×2): 4 mg via ORAL
  Filled 2013-11-04 (×2): qty 1

## 2013-11-04 MED ORDER — SODIUM CHLORIDE 0.9 % IV SOLN: Freq: Once | INTRAVENOUS | Status: DC

## 2013-11-04 MED ORDER — SIMETHICONE 80 MG PO CHEW
160.0000 mg | CHEWABLE_TABLET | Freq: Four times a day (QID) | ORAL | Status: DC | PRN
  Filled 2013-11-04: qty 2

## 2013-11-04 MED ORDER — PANTOPRAZOLE SODIUM 40 MG PO TBEC
40.0000 mg | DELAYED_RELEASE_TABLET | Freq: Every day | ORAL | Status: DC
  Administered 2013-11-04 – 2013-11-07 (×4): 40 mg via ORAL
  Filled 2013-11-04 (×4): qty 1

## 2013-11-04 MED ORDER — HEPARIN SODIUM (PORCINE) 5000 UNIT/ML IJ SOLN
5000.0000 [IU] | Freq: Three times a day (TID) | INTRAMUSCULAR | Status: DC
  Administered 2013-11-04 – 2013-11-06 (×10): 5000 [IU] via SUBCUTANEOUS
  Filled 2013-11-04 (×15): qty 1

## 2013-11-04 MED ORDER — IOHEXOL 300 MG/ML  SOLN
80.0000 mL | Freq: Once | INTRAMUSCULAR | Status: AC | PRN
  Administered 2013-11-04: 80 mL via INTRAVENOUS

## 2013-11-04 MED ORDER — BIOTENE DRY MOUTH MT LIQD
15.0000 mL | Freq: Two times a day (BID) | OROMUCOSAL | Status: DC
  Administered 2013-11-04 – 2013-11-07 (×8): 15 mL via OROMUCOSAL

## 2013-11-04 MED ORDER — SENNOSIDES-DOCUSATE SODIUM 8.6-50 MG PO TABS
2.0000 | ORAL_TABLET | Freq: Two times a day (BID) | ORAL | Status: DC
  Filled 2013-11-04 (×2): qty 2

## 2013-11-04 MED ORDER — CHLORHEXIDINE GLUCONATE 0.12 % MT SOLN
15.0000 mL | Freq: Two times a day (BID) | OROMUCOSAL | Status: DC
  Administered 2013-11-04 – 2013-11-07 (×6): 15 mL via OROMUCOSAL
  Filled 2013-11-04 (×8): qty 15

## 2013-11-04 MED ORDER — SIMETHICONE 80 MG PO CHEW
80.0000 mg | CHEWABLE_TABLET | Freq: Four times a day (QID) | ORAL | Status: DC | PRN
  Administered 2013-11-04 – 2013-11-06 (×4): 80 mg via ORAL
  Filled 2013-11-04 (×6): qty 1

## 2013-11-04 MED ORDER — LACTULOSE 10 GM/15ML PO SOLN
10.0000 g | Freq: Two times a day (BID) | ORAL | Status: DC
  Filled 2013-11-04 (×2): qty 15

## 2013-11-04 MED ORDER — IOHEXOL 300 MG/ML  SOLN
25.0000 mL | INTRAMUSCULAR | Status: AC
  Administered 2013-11-04: 25 mL via ORAL

## 2013-11-04 MED ORDER — ONDANSETRON HCL 4 MG PO TABS: 8.0000 mg | ORAL_TABLET | Freq: Three times a day (TID) | ORAL | Status: DC | PRN

## 2013-11-04 MED ORDER — SODIUM CHLORIDE 0.9 % IV SOLN
INTRAVENOUS | Status: DC
  Administered 2013-11-04 – 2013-11-05 (×4): via INTRAVENOUS

## 2013-11-04 MED ORDER — ENSURE COMPLETE PO LIQD
237.0000 mL | Freq: Two times a day (BID) | ORAL | Status: DC
  Administered 2013-11-04 – 2013-11-07 (×4): 237 mL via ORAL

## 2013-11-04 MED ORDER — ALBUTEROL SULFATE (2.5 MG/3ML) 0.083% IN NEBU: 2.5000 mg | INHALATION_SOLUTION | Freq: Four times a day (QID) | RESPIRATORY_TRACT | Status: DC | PRN

## 2013-11-04 MED ORDER — DEXTROSE 5 % IV SOLN
2.0000 g | Freq: Three times a day (TID) | INTRAVENOUS | Status: DC
  Administered 2013-11-04 – 2013-11-07 (×10): 2 g via INTRAVENOUS
  Filled 2013-11-04 (×12): qty 2

## 2013-11-04 MED ORDER — POLYETHYLENE GLYCOL 3350 17 G PO PACK
17.0000 g | PACK | Freq: Every day | ORAL | Status: DC
  Filled 2013-11-04: qty 1

## 2013-11-04 MED ORDER — PROCHLORPERAZINE MALEATE 10 MG PO TABS
10.0000 mg | ORAL_TABLET | Freq: Four times a day (QID) | ORAL | Status: DC | PRN
  Filled 2013-11-04: qty 1

## 2013-11-04 MED ORDER — MORPHINE SULFATE (CONCENTRATE) 10 MG /0.5 ML PO SOLN
5.0000 mg | ORAL | Status: DC | PRN
  Administered 2013-11-04 – 2013-11-07 (×13): 5 mg via ORAL
  Filled 2013-11-04 (×13): qty 0.5

## 2013-11-04 MED ORDER — SIMETHICONE 80 MG PO CHEW
80.0000 mg | CHEWABLE_TABLET | Freq: Four times a day (QID) | ORAL | Status: DC | PRN
  Administered 2013-11-04: 80 mg via ORAL
  Filled 2013-11-04: qty 1

## 2013-11-04 NOTE — Progress Notes (Signed)
CARE MANAGEMENT NOTE 11/04/2013  Patient:  Angel [name], Angel [name]   Account Number:  [id number]  Date Initiated:  11/04/2013  Documentation initiated by:  [facility]  Subjective/Objective Assessment:   63 Y/O M ADMITTED W/NEUTROPENIC FEVER.READMIT 6/29-11/01/13-HYPERCALCEMIA.AV:[id number] TONGUE CA.     Action/Plan:   FROM HOME W/SPOUSE.PCP-DR.Roosevelt.   Anticipated DC Date:  11/08/2013   Anticipated DC Plan:  [place]  In-house referral  Financial Counselor      DC Planning Services  CM consult      Choice offered to / List presented to:  C-3 Spouse           Status of service:   Medicare Important Message given?   (If response is "NO", the following Medicare IM given date fields will be blank) Date Medicare IM given:   Medicare IM given by:   Date Additional Medicare IM given:   Additional Medicare IM given by:    Discharge Disposition:    Per UR Regulation:  Reviewed for med. necessity/level of care/duration of stay  If discussed at [place] of Stay Meetings, dates discussed:    Comments:  11/03/13 Angel Strum RN,BSN NCM [name] PENDING.[facility] CHOSEN FOR HHRN-DISEASE MGMNT/READMIT.TC [facility] REP Angel [name] OF REFERRAL.RECOMMEND PT CONS.AWAIT FINAL [facility] ORDERS.

## 2013-11-04 NOTE — Progress Notes (Signed)
[facility] PROGRESS NOTE  GRANT SWAGER HBZ:[id number] DOB: 24-Jun-1950 DOA: 11/03/2013 PCP: [name], MD  Assessment/Plan: 63 y.o. 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 Neutropenic fever/sirs; hemodynamically stable; CXR no clear infiltartes -cont IV atx, pend cultures; IVF; monitor   2. Abdominal pain, diarrhea (recent atx exposure zosyn 7 days) -obtain CT abd r/o colitis; c diff; IVF, antiemetics; monitor  3. Hypo Na likely due to chemo; cont IVF, monitor  4. Squamous cell carcinoma of the tongue, lymph node metastasis/neck pain, s/p chemotherapy with Taxotere, cis-platinum, and 5-FU -per oncology  5. Tobacco use; ? underlying COPD: no wheezing on exam;  -CXR no clear infiltrates; cont inhalers prn; PFTs outpatient     Code Status: full Family Communication:  D/w patient, his wife  (indicate person spoken with, relationship, and if by phone, the number) Disposition Plan: home pend clinical improvement    Consultants:  Oncology   Procedures:  none  Antibiotics:  Cefepime 7/8<<<<  vanc 7/8<<<<<   (indicate start date, and stop date if known)  HPI/Subjective: alert  Objective: Filed Vitals:   11/04/13 0530  BP: 124/63  Pulse: 102  Temp: 100.3 F (37.9 C)  Resp: 20    Intake/Output Summary (Last 24 hours) at 11/04/13 0854 Last data filed at 11/04/13 0600  Gross per 24 hour  Intake 426.25 ml  Output      0 ml  Net 426.25 ml   Filed Weights   11/04/13 0048  Weight: 44.589 kg (98 lb 4.8 oz)    Exam:   General:  alert  Cardiovascular: s1,s2 rrr  Respiratory: CTA BL  Abdomen: soft, mild diffuse tender   Musculoskeletal: no LE edema    Data  Reviewed: Basic Metabolic Panel:  Recent Labs Lab 10/30/13 0600 10/31/13 0545 11/01/13 0630 11/03/13 2220 11/04/13 0455  NA 133* 134* 131* 125* 128*  K 4.1 3.7 3.9 3.6* 3.8  CL 97 96 96 89* 93*  CO2 27 25 23 22 23   GLUCOSE 93 102* 113* 131* 122*  BUN 11 9 8 12 11   CREATININE 0.63 0.60 0.58 0.70 0.77  CALCIUM 7.7* 7.5* 7.4* 8.1* 7.6*   Liver Function Tests:  Recent Labs Lab 11/03/13 2220  AST 7  ALT 9  ALKPHOS 53  BILITOT 0.6  PROT 5.6*  ALBUMIN 2.5*    Recent Labs Lab 11/03/13 2220  LIPASE 14   No results found for this basename: AMMONIA,  in the last 168 hours CBC:  Recent Labs Lab 10/30/13 0600 10/31/13 0545 11/01/13 0630 11/03/13 2220 11/04/13 0455  WBC 30.9* 20.1* 9.3 0.9* 1.2*  NEUTROABS  --   --   --  0.1*  --   HGB 11.5* 11.4* 11.1* 10.5* 10.5*  HCT 34.0* 33.6* 32.1* 31.0* 30.9*  MCV 96.9 96.0 95.0 94.2 94.8  PLT 379 298 268 164 146*   Cardiac Enzymes: No results found for this basename: CKTOTAL, CKMB, CKMBINDEX, TROPONINI,  in the last 168 hours BNP (last 3 results) No results found for this basename: PROBNP,  in the last 8760 hours CBG: No results found for this basename: GLUCAP,  in the last 168 hours  No results found for this or any previous visit (from the past 240 hour(s)).   Studies: Dg Chest  2 View  11/03/2013   CLINICAL DATA:  Fever and nausea  EXAM: CHEST  2 VIEW  COMPARISON:  None.  FINDINGS: Shallow inspiration with elevation of left hemidiaphragm. Normal heart size and pulmonary vascularity. Right PICC catheter with tip over the cavoatrial junction. No focal airspace disease or consolidation in the lungs. No blunting of costophrenic angles. No pneumothorax. Hilar and mediastinal contours appear symmetrical. Prominent soft tissue in the left side of the neck corresponding with known mass.  IMPRESSION: Elevation of left hemidiaphragm. No evidence of active pulmonary disease.   Electronically Signed   By: Lucienne Capers M.D.   On:  11/03/2013 23:27    Scheduled Meds: . sodium chloride   Intravenous Once  . antiseptic oral rinse  15 mL Mouth Rinse BID  . ceFEPime (MAXIPIME) IV  2 g Intravenous 3 times per day  . chlorhexidine  15 mL Mouth/Throat BID  . dexamethasone  4 mg Oral BID  . heparin  5,000 Units Subcutaneous 3 times per day  . lactulose  10 g Oral BID  . pantoprazole  40 mg Oral Daily  . polyethylene glycol  17 g Oral Daily  . senna-docusate  2 tablet Oral BID   Continuous Infusions: . sodium chloride 75 mL/hr at 11/04/13 0059    Principal Problem:   Neutropenic fever Active Problems:   Cancer of base of tongue    Time spent: >35    Kinnie Feil  [facility] Pager [phone]. If 7PM-7AM, please contact night-coverage at [email or web address], password [facility] 11/04/2013, 8:54 AM  LOS: 1 day

## 2013-11-04 NOTE — Progress Notes (Signed)
Bladder scan performed and resulted in 190cc.  Stacey Drain

## 2013-11-04 NOTE — Progress Notes (Signed)
Pt transported to 4th floor from ED. Pt ambulated to bed with little assistance. Wife at bedside. VSS. No distress noted. Will continue to monitor pt closely. Angel [name] I

## 2013-11-04 NOTE — Consult Note (Signed)
[facility] NOTE  Patient Care Team: [name], MD as PCP - General (Hematology and Oncology) Brooks Sailors, RN as Oncology Nurse Navigator  CHIEF COMPLAINTS/PURPOSE OF CONSULTATION:  Severe diarrhea, neutropenia, dehydration, on background history of locally advanced tongue cancer.  HISTORY OF PRESENTING ILLNESS:  Angel [name] 63 y.o. male is here because of complaints above. He has complex history of locally advanced base of tongue cancer. Summary of oncologic history is as follows. Oncology History   Cancer of base of tongue   Primary site: Pharynx - Oropharynx   Staging method: AJCC 7th Edition   Clinical: Stage IVB (T2, N3, M0) signed by [name], MD on 10/15/2013 10:25 AM   Summary: Stage IVB (T2, N3, M0)       Cancer of base of tongue   09/17/2013 Imaging CT scan of the neck showed large bilateral neck lymphadenopathy with associated tongue mass.   09/23/2013 Procedure Fine needle aspirate of the left neck mass came back positive for squamous cell carcinoma.   10/04/2013 Surgery Laryngoscopy showed a firm mass within the left tongue base extending past the midline and encompassing much of the tongue base.  There is extension of the mass into the vallecula and on to the lingual surface of the epiglottis.   10/04/2013 Pathology Vallecula and tongue biopsy confirmed squamous cell carcinoma.   10/15/2013 Imaging PET/CT scan showed no evidence of distant metastatic disease apart from just regional lymph node involvement.   The patient was started on cycle 1 of chemotherapy on 10/27/2013, completed by 11/01/2013. He was discharged and then received 1 dose of Neulasta injection on 11/02/2013. Since yesterday evening, he had uncontrolled diarrhea and felt dehydrated. He also complained of nausea but denied vomiting. My office never received a phone call from the patient. His wife brought him into the emergency department. He was noted to have abnormal blood  count and was grossly dehydrated and hence was admitted for further management. The patient denies any recent signs or symptoms of bleeding such as spontaneous epistaxis, hematuria or hematochezia. He had profuse drainage coming from the left neck 1. He denies fever, chills or cough. He has some mild and diffuse abdominal discomfort. He denies any mucositis or dysphagia. His appetite is poor and he felt he might have lost more weight.  MEDICAL HISTORY:  Past Medical History  Diagnosis Date  . Constipation due to pain medication   . Malignant neoplasm of base of tongue     left neck lymph and Left BOT cancer  . Current smoker   . 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 Testicle surgery      as a infant  . Appendectomy    . Mandible surgery      from [place]  . Back surgery      Lumbar  . Tonsillectomy    . Panendoscopy N/A 10/04/2013    Procedure: Direct Laryngoscopy  WITH BIOPSY;  Surgeon: Melida Quitter, MD;  Location: [facility];  Service: ENT;  Laterality: N/A;  . Esophagoscopy  10/04/2013    Procedure: ESOPHAGOSCOPY;  Surgeon: Melida Quitter, MD;  Location: [facility];  Service: ENT;;  . Multiple extractions with alveoloplasty N/A 10/08/2013    Procedure: extraction of tooth #'s 3,5,5,7,3,22,02,54,27,06,23,76,28,31,51,76,16,07,37, 27,28, 29, 31 with alveoloplasty ;  Surgeon: Lenn Cal, DDS;  Location:  [facility];  Service: Oral Surgery;  Laterality: N/A;    SOCIAL HISTORY: History   Social History  . Marital Status: Married    Spouse Name: N/A    Number of Children: 1  . Years of Education: N/A   Occupational History  .     Social History Main Topics  . Smoking status: Heavy Tobacco Smoker -- 2.00 packs/day for 45 years    Types: Cigarettes  . Smokeless tobacco: Never Used  . Alcohol Use: No     Comment: "Heavy" Alcohol Use in the Past  .  Drug Use: Yes    Special: Marijuana  . Sexual Activity: Not on file   Other Topics Concern  . Not on file   Social History Narrative  . No narrative on file    FAMILY HISTORY: Family History  Problem Relation Age of Onset  . Cancer Mother     Lung  . Cancer Father     ? Lung    ALLERGIES:  has No Known Allergies.  MEDICATIONS:  Current Facility-Administered Medications  Medication Dose Route Frequency Provider Last Rate Last Dose  . 0.9 %  sodium chloride infusion   Intravenous Continuous Etta Quill, DO 75 mL/hr at 11/04/13 0059    . 0.9 %  sodium chloride infusion   Intravenous Once [name], MD 500 mL/hr at 11/04/13 0824    . acetaminophen (TYLENOL) tablet 650 mg  650 mg Oral Q6H PRN Etta Quill, DO      . antiseptic oral rinse (BIOTENE) solution 15 mL  15 mL Mouth Rinse BID Etta Quill, DO   15 mL at 11/04/13 0128  . ceFEPIme (MAXIPIME) 2 g in dextrose 5 % 50 mL IVPB  2 g Intravenous 3 times per day Etta Quill, DO   2 g at 11/04/13 0539  . chlorhexidine (PERIDEX) 0.12 % solution 15 mL  15 mL Mouth/Throat BID Etta Quill, DO      . dexamethasone (DECADRON) tablet 4 mg  4 mg Oral BID Etta Quill, DO      . heparin injection 5,000 Units  5,000 Units Subcutaneous 3 times per day Etta Quill, DO   5,000 Units at 11/04/13 0539  . lactulose (CHRONULAC) 10 GM/15ML solution 10 g  10 g Oral BID Etta Quill, DO      . morphine CONCENTRATE 10 mg / 0.5 ml oral solution 5 mg  5 mg Oral Q2H PRN Etta Quill, DO   5 mg at 11/04/13 0130  . ondansetron (ZOFRAN) tablet 8 mg  8 mg Oral Q8H PRN Etta Quill, DO      . pantoprazole (PROTONIX) EC tablet 40 mg  40 mg Oral Daily Jared M Gardner, DO      . polyethylene glycol (MIRALAX / GLYCOLAX) packet 17 g  17 g Oral Daily Etta Quill, DO      . prochlorperazine (COMPAZINE) tablet 10 mg  10 mg Oral Q6H PRN Etta Quill, DO      . senna-docusate (Senokot-S) tablet 2 tablet  2 tablet Oral BID Etta Quill, DO      . simethicone [facility]) chewable tablet 160 mg  160 mg Oral QID PRN Rhetta Mura Schorr, NP        REVIEW OF SYSTEMS:   Constitutional: Denies fevers, chills or abnormal night sweats Eyes: Denies blurriness of vision, double vision or watery eyes Ears, nose, mouth, throat, and face: Denies mucositis or  sore throat Respiratory: Denies cough, dyspnea or wheezes Cardiovascular: Denies palpitation, chest discomfort or lower extremity swelling Skin: Denies abnormal skin rashes Lymphatics: Denies new lymphadenopathy or easy bruising Neurological:Denies numbness, tingling or new weaknesses Behavioral/Psych: Mood is stable, no new changes  All other systems were reviewed with the patient and are negative.  PHYSICAL EXAMINATION: ECOG PERFORMANCE STATUS: 2 - Symptomatic, <50% confined to bed  Filed Vitals:   11/04/13 0530  BP: 124/63  Pulse: 102  Temp: 100.3 F (37.9 C)  Resp: 20   Filed Weights   11/04/13 0048  Weight: 98 lb 4.8 oz (44.589 kg)    GENERAL:alert, no distress and comfortable. Thin and cachectic SKIN: Persistent erythema and drainage from the neck wound. No other rashes. EYES: normal, conjunctiva are pink and non-injected, sclera clear OROPHARYNX:no exudate, no erythema and lips, buccal mucosa normal with dry tongue. NECK: supple, with persistent neck mass, no change compared to prior exam.  LYMPH:  Palpable lymphadenopathy in the neck.  LUNGS: clear to auscultation and percussion with normal breathing effort HEART: regular rate & rhythm and no murmurs and no lower extremity edema ABDOMEN:abdomen soft, mild diffuse tenderness without rebound or guarding with normal bowel sounds Musculoskeletal:no cyanosis of digits and no clubbing  PSYCH: alert & oriented x 3 with fluent speech NEURO: no focal motor/sensory deficits  LABORATORY DATA:  I have reviewed the data as listed Lab Results  Component Value Date   WBC 1.2* 11/04/2013   HGB 10.5* 11/04/2013    HCT 30.9* 11/04/2013   MCV 94.8 11/04/2013   PLT 146* 11/04/2013    Recent Labs  10/26/13 0415  10/28/13 0515  11/01/13 0630 11/03/13 2220 11/04/13 0455  NA 138  < > 136*  < > 131* 125* 128*  K 3.3*  < > 3.3*  < > 3.9 3.6* 3.8  CL 96  < > 100  < > 96 89* 93*  CO2 32  < > 26  < > 23 22 23   GLUCOSE 138*  < > 131*  < > 113* 131* 122*  BUN 15  < > 17  < > 8 12 11   CREATININE 0.82  < > 0.63  < > 0.58 0.70 0.77  CALCIUM 13.0*  < > 9.2  < > 7.4* 8.1* 7.6*  GFRNONAA >90  < > >90  < > >90 >90 >90  GFRAA >90  < > >90  < > >90 >90 >90  PROT 6.4  --  5.4*  --   --  5.6*  --   ALBUMIN 2.8*  --  2.3*  --   --  2.5*  --   AST 11  --  15  --   --  7  --   ALT 8  --  12  --   --  9  --   ALKPHOS 63  --  50  --   --  53  --   BILITOT 0.3  --  0.3  --   --  0.6  --   < > = values in this interval not displayed.  RADIOGRAPHIC STUDIES: I have personally reviewed the radiological images as listed and agreed with the findings in the report. Dg Chest 2 View  11/03/2013   CLINICAL DATA:  Fever and nausea  EXAM: CHEST  2 VIEW  COMPARISON:  None.  FINDINGS: Shallow inspiration with elevation of left hemidiaphragm. Normal heart size and pulmonary vascularity. Right PICC catheter with tip over the cavoatrial junction. No  focal airspace disease or consolidation in the lungs. No blunting of costophrenic angles. No pneumothorax. Hilar and mediastinal contours appear symmetrical. Prominent soft tissue in the left side of the neck corresponding with known mass.  IMPRESSION: Elevation of left hemidiaphragm. No evidence of active pulmonary disease.   Electronically Signed   By: Lucienne Capers M.D.   On: 11/03/2013 23:27    ASSESSMENT & PLAN:  Cancer of base of tongue with regional lymph node metastasis PET CT scan showed no evidence of distant metastatic disease, he will be a good candidate for induction chemotherapy followed by radiation treatment.  He has completed cycle 1 of treatment, complicated by readmission  for nausea, dehydration and diarrhea. Recommend continue supportive care.  Tobacco abuse  I spent some time counseling the patient the importance of tobacco cessation.   Neutropenia This is to to recent chemotherapy. He had received Neulasta injection 2 days ago. I do not recommend G-CSF unless the patient appeared septic.  Anemia This is likely anemia of chronic disease. The patient denies recent history of bleeding such as epistaxis, hematuria or hematochezia. He is asymptomatic from the anemia. We will observe for now.  He does not require transfusion now.   Neck pain  This is due to cancer.  Continue pain medicine as needed.  DVT prophylaxis He needs to be started on Lovenox  Severe diarrhea He had been on laxative due to severe constipation. Laxative has been discontinued. His chemotherapy regimen is also known to cause severe diarrhea. Due to recent exposure to chemotherapy and antibiotics, we need to exclude Clostridium difficile. Recommend increase hydration. Okay to use Imodium as needed.  Hyponatremia This is due to severe dehydration. I would give him 1 L of bolus normal saline followed by maintenance IV fluids.  Protein calorie malnutrition Recommend nutrition consult while hospitalized.  Nausea This is not uncommon side effects of chemotherapy. Continue antiemetics as needed.  Discharge planning I recommend keeping him over the weekend until neutropenia resolves.  CODE STATUS Full code  I am away tomorrow and the weekend. I will get one of my colleagues to check on him this weekend and will return to see him next week.     [place], [place], MD 11/04/2013 8:50 AM

## 2013-11-04 NOTE — Progress Notes (Signed)
ANTIBIOTIC CONSULT NOTE - INITIAL  Pharmacy Consult for Cefepime Indication: Febrile Neutropenia  No Known Allergies  Patient Measurements: Height: 5' 5.5" (166.4 cm) Weight: 98 lb 4.8 oz (44.589 kg) IBW/kg (Calculated) : 62.65 Adjusted Body Weight:   Vital Signs: Temp: 98.5 F (36.9 C) (07/09 0048) Temp src: Oral (07/09 0048) BP: 106/67 mmHg (07/09 0048) Pulse Rate: 86 (07/09 0048) Intake/Output from previous day: 07/08 0701 - 07/09 0700 In: 151.3 [I.V.:151.3] Out: -  Intake/Output from this shift: Total I/O In: 151.3 [I.V.:151.3] Out: -   Labs:  Recent Labs  11/01/13 0630 11/03/13 2220  WBC 9.3 0.9*  HGB 11.1* 10.5*  PLT 268 164  CREATININE 0.58 0.70   Estimated Creatinine Clearance: 61.2 ml/min (by C-G formula based on Cr of 0.7). No results found for this basename: VANCOTROUGH, VANCOPEAK, VANCORANDOM, GENTTROUGH, GENTPEAK, GENTRANDOM, TOBRATROUGH, TOBRAPEAK, TOBRARND, AMIKACINPEAK, AMIKACINTROU, AMIKACIN,  in the last 72 hours   Microbiology: No results found for this or any previous visit (from the past 720 hour(s)).  Medical History: Past Medical History  Diagnosis Date  . Constipation due to pain medication   . Malignant neoplasm of base of tongue     left neck lymph and Left BOT cancer  . Current smoker   . 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 Base of Tongue and Vallecula    Medications:  Anti-infectives   Start     Dose/Rate Route Frequency Ordered Stop   11/04/13 0600  ceFEPIme (MAXIPIME) 2 g in dextrose 5 % 50 mL IVPB     2 g 100 mL/hr over 30 Minutes Intravenous 3 times per day 11/04/13 0413     11/04/13 0000  ceFEPIme (MAXIPIME) 2 g in dextrose 5 % 50 mL IVPB     2 g 100 mL/hr over 30 Minutes Intravenous  Once 11/03/13 2348 11/04/13 0158   11/03/13 2330  vancomycin (VANCOCIN) IVPB 1000 mg/200 mL premix     1,000 mg 200 mL/hr over 60 Minutes Intravenous  Once 11/03/13 2322 11/04/13 0037      Assessment: Patient with febrile neutropenia.  First dose of antibiotics already given.    Goal of Therapy:  Cefepime dosed based on patient weight and renal function   Plan:  Follow up culture results Cefepime 2gm iv q8hr  Tyler Deis, Shea Stakes [place] 11/04/2013,4:15 AM

## 2013-11-04 NOTE — H&P (Signed)
[facility] History and Physical  GAWAIN CROMBIE ZJQ:[id number] DOB: [birth year] DOA: 11/03/2013  Referring physician: EDP PCP: [name], MD   Chief Complaint: Fever   HPI: Angel [name] is a 63 y.o. male just discharged 2 days ago after a 5 day course of induction chemotherapy.  He presents after developing fever today.  He also reports frequent bouts of loose stool, and occasional gassy abdominal pain not present during evaluation.  Temp at home was 100.1.  He has tried nothing for fever but presented to ED as instructed after chemo.  WBC 0.9 in ED down from 9.3 2 days ago.  Review of Systems: Systems reviewed.  As above, otherwise negative  Past Medical History  Diagnosis Date  . Constipation due to pain medication   . Malignant neoplasm of base of tongue     left neck lymph and Left BOT cancer  . Current smoker   . 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 Testicle surgery      as a infant  . Appendectomy    . Mandible surgery      from [place]  . Back surgery      Lumbar  . Tonsillectomy    . Panendoscopy N/A 10/04/2013    Procedure: Direct Laryngoscopy  WITH BIOPSY;  Surgeon: Melida Quitter, MD;  Location: [facility];  Service: ENT;  Laterality: N/A;  . Esophagoscopy  10/04/2013    Procedure: ESOPHAGOSCOPY;  Surgeon: Melida Quitter, MD;  Location: [place];  Service: ENT;;  . Multiple extractions with alveoloplasty N/A 10/08/2013    Procedure: extraction of tooth #'s 2,3,4,5,6,11,12,13,14,15,18,19,20,21,22,23,24,25,26, 27,28, 29, 31 with alveoloplasty ;  Surgeon: Lenn Cal, DDS;  Location: [facility];  Service: Oral Surgery;  Laterality: N/A;   Social History:  reports that he has been smoking Cigarettes.  He has a 90 pack-year smoking history. He has never used smokeless tobacco. He reports that he uses illicit drugs (Marijuana). He reports that he does not  drink alcohol.  No Known Allergies  Family History  Problem Relation Age of Onset  . Cancer Mother     Lung  . Cancer Father     ? Lung     Prior to Admission medications   Medication Sig Start Date End Date Taking? Authorizing Provider  acetaminophen (TYLENOL) 325 MG tablet Take 650 mg by mouth every 6 (six) hours as needed for mild pain or moderate pain.   Yes Historical Provider, MD  antiseptic oral rinse (BIOTENE) LIQD 15 mLs by Mouth Rinse route 2 (two) times daily. 11/01/13  Yes Venetia Maxon Rama, MD  chlorhexidine (PERIDEX) 0.12 % solution Rinse with 15 mls twice daily for 30 seconds. Use after breakfast and at bedtime. Spit out excess. Do not swallow. 10/18/13  Yes Lenn Cal, DDS  dexamethasone (DECADRON) 4 MG tablet Start the day before Taxotere. Take once the day after, then 2 times a day x 2 days. Repeat in 3 weeks 10/15/13  Yes [name], MD  lactulose (CHRONULAC) 10 GM/15ML solution Take 15 mLs (10 g total) by mouth 2 (two) times daily. 11/01/13  Yes Venetia Maxon Rama, MD  Morphine Sulfate (MORPHINE CONCENTRATE) 10 mg / 0.5 ml concentrated solution Take 0.25 mLs (5 mg total) by mouth every 2 (two) hours as needed for severe pain. 11/01/13  Yes Venetia Maxon Rama, MD  ondansetron (  ZOFRAN) 8 MG tablet Take 1 tablet (8 mg total) by mouth every 8 (eight) hours as needed for nausea or vomiting (Nausea or vomiting). 10/15/13  Yes [name], MD  pantoprazole (PROTONIX) 40 MG tablet Take 1 tablet (40 mg total) by mouth daily. 11/01/13  Yes Venetia Maxon Rama, MD  polyethylene glycol (MIRALAX / GLYCOLAX) packet Take 17 g by mouth daily. 11/01/13  Yes Venetia Maxon Rama, MD  prochlorperazine (COMPAZINE) 10 MG tablet Take 1 tablet (10 mg total) by mouth every 6 (six) hours as needed (Nausea or vomiting). 10/15/13  Yes [name], MD  senna-docusate (SENOKOT-S) 8.6-50 MG per tablet Take 2 tablets by mouth 2 (two) times daily. 11/01/13  Yes Venetia Maxon Rama, MD   Physical Exam: Filed Vitals:   11/03/13  2246  BP: 117/78  Pulse: 104  Temp:   Resp: 22    BP 117/78  Pulse 104  Temp(Src) 100.1 F (37.8 C) (Oral)  Resp 22  SpO2 98%  General Appearance:    Alert, oriented, no distress, appears stated age  Head:    Normocephalic, atraumatic  Eyes:    PERRL, EOMI, sclera non-icteric        Nose:   Nares without drainage or epistaxis. Mucosa, turbinates normal  Throat:   Moist mucous membranes. Oropharynx without erythema or exudate.  Neck:   Tumor evident on the L side of his neck.  Back:     No CVA tenderness, no spinal tenderness  Lungs:     Clear to auscultation bilaterally, without wheezes, rhonchi or rales  Chest wall:    No tenderness to palpitation  Heart:    Regular rate and rhythm without murmurs, gallops, rubs  Abdomen:     Soft, non-tender, nondistended, normal bowel sounds, no organomegaly  Genitalia:    deferred  Rectal:    deferred  Extremities:   No clubbing, cyanosis or edema.  Pulses:   2+ and symmetric all extremities  Skin:   Skin color, texture, turgor normal, no rashes or lesions  Lymph nodes:   Cervical, supraclavicular, and axillary nodes normal  Neurologic:   CNII-XII intact. Normal strength, sensation and reflexes      throughout    Labs on Admission:  Basic Metabolic Panel:  Recent Labs Lab 10/28/13 0515 10/29/13 0319 10/30/13 0600 10/31/13 0545 11/01/13 0630 11/03/13 2220  NA 136* 136* 133* 134* 131* 125*  K 3.3* 3.8 4.1 3.7 3.9 3.6*  CL 100 100 97 96 96 89*  CO2 26 27 27 25 23 22   GLUCOSE 131* 119* 93 102* 113* 131*  BUN 17 14 11 9 8 12   CREATININE 0.63 0.61 0.63 0.60 0.58 0.70  CALCIUM 9.2 8.1* 7.7* 7.5* 7.4* 8.1*  MG 2.3  --   --   --   --   --    Liver Function Tests:  Recent Labs Lab 10/28/13 0515 11/03/13 2220  AST 15 7  ALT 12 9  ALKPHOS 50 53  BILITOT 0.3 0.6  PROT 5.4* 5.6*  ALBUMIN 2.3* 2.5*    Recent Labs Lab 11/03/13 2220  LIPASE 14   No results found for this basename: AMMONIA,  in the last 168  hours CBC:  Recent Labs Lab 10/29/13 0319 10/30/13 0600 10/31/13 0545 11/01/13 0630 11/03/13 2220  WBC 34.1* 30.9* 20.1* 9.3 0.9*  NEUTROABS  --   --   --   --  0.1*  HGB 11.1* 11.5* 11.4* 11.1* 10.5*  HCT 32.3* 34.0* 33.6* 32.1* 31.0*  MCV 96.1 96.9 96.0 95.0 94.2  PLT 476* 379 298 268 164   Cardiac Enzymes: No results found for this basename: CKTOTAL, CKMB, CKMBINDEX, TROPONINI,  in the last 168 hours  BNP (last 3 results) No results found for this basename: PROBNP,  in the last 8760 hours CBG: No results found for this basename: GLUCAP,  in the last 168 hours  Radiological Exams on Admission: Dg Chest 2 View  11/03/2013   CLINICAL DATA:  Fever and nausea  EXAM: CHEST  2 VIEW  COMPARISON:  None.  FINDINGS: Shallow inspiration with elevation of left hemidiaphragm. Normal heart size and pulmonary vascularity. Right PICC catheter with tip over the cavoatrial junction. No focal airspace disease or consolidation in the lungs. No blunting of costophrenic angles. No pneumothorax. Hilar and mediastinal contours appear symmetrical. Prominent soft tissue in the left side of the neck corresponding with known mass.  IMPRESSION: Elevation of left hemidiaphragm. No evidence of active pulmonary disease.   Electronically Signed   By: Lucienne Capers M.D.   On: 11/03/2013 23:27    EKG: Independently reviewed.  Assessment/Plan Principal Problem:   Neutropenic fever Active Problems:   Cancer of base of tongue   1. Neutropenic fever - patient on cefepime, also got 1 dose of vanc in ED, will continue cefepime while inpatient, repeat CBC in AM, got neulasta just yesterday, neutropenic precautions. 2. Hyponatremia - likely due to loose stools, putting patient on IVF for hydration 3. Cancer of base of tongue - currently s/p induction chemotherapy, goal of treatment at this point is cure.  Oncology consult put into EPIC for Dr. Alvy Bimler to see patient tomorrow.    Code Status: Full Code  Family  Communication: Wife at bedside Disposition Plan: Admit to inpatient   Time spent: 27 min  Amalee Olsen M. [facility] Pager [phone]  If 7AM-7PM, please contact the day team taking care of the patient [email or web address] Password [email or web address] 11/04/2013, 12:06 AM

## 2013-11-04 NOTE — Progress Notes (Addendum)
INITIAL NUTRITION ASSESSMENT  DOCUMENTATION CODES Per approved criteria  -Severe malnutrition in the context of chronic illness -Underweight  Pt meets criteria for severe MALNUTRITION in the context of chronic disease as evidenced by PO intake <75% for > one month, 17% body weight loss in one month (20 lbs).   INTERVENTION: -Recommend Ensure Complete po BID, each supplement provides 350 kcal and 13 grams of protein -Recommend Carnation Instant Breakfast TID w/meals -Pt may benefit from SLP evaluation for better tolerance/need for softer diet textures -Will continue to monitor  NUTRITION DIAGNOSIS: Inadequate oral intake related to nausea/decreased appetite/early satiety as evidenced by PO intake <75%, 4.9% body weight loss in one week.   Goal: Pt to meet >/= 90% of their estimated nutrition needs    Monitor:  Total protein/energy intake, labs, weights, swallow profile  Reason for Assessment: MST/underweight BMI  63 y.o. male  Admitting Dx: Neutropenic fever  ASSESSMENT: Angel [name] is a 63 y.o. male just discharged 2 days ago after a 5 day course of induction chemotherapy. He presents after developing fever today. He also reports frequent bouts of loose stool, and occasional gassy abdominal pain not present during evaluation.   -Pt's wife reported pt eating very soft foods, mashed potatoes and pudding, with minimal intake. This have been ongoing since May 2015 per oncology outpatient RD note. May benefit from SLP evaluation for possible diet downgrade. Pt will occassionally drink chocolate Ensure and chocolate Carnation Instant Breakfast. Appeared to have minimal interest in either supplement; however pt in agreement to encourage supplements as tolerated -Currently holding off foods for CT scan. Pt noted having difficult time drinking prep for scan d/t nausea, and feelings of early satiety.  -Pt having loose stools, which are also likely contributing to decreased intake. MD  noted possible C. diff or colitis -Continues to have weight loss. Previous medical records indicate pt lost 5 lbs in past 9 days (4.9% body weight loss, severe for time frame)  Height: Ht Readings from Last 1 Encounters:  11/04/13 5' 5.5" (1.664 m)    Weight: Wt Readings from Last 1 Encounters:  11/04/13 98 lb 4.8 oz (44.589 kg)    Ideal Body Weight: 142 lbs  % Ideal Body Weight: 69%  Wt Readings from Last 10 Encounters:  11/04/13 98 lb 4.8 oz (44.589 kg)  10/26/13 103 lb 9.9 oz (47 kg)  10/14/13 106 lb 14.4 oz (48.49 kg)  10/13/13 106 lb (48.081 kg)  10/08/13 114 lb (51.71 kg)  10/08/13 114 lb (51.71 kg)  10/05/13 114 lb 8 oz (51.937 kg)  10/04/13 117 lb (53.071 kg)  10/04/13 117 lb (53.071 kg)  09/29/13 117 lb 9 oz (53.326 kg)    Usual Body Weight: 116 lbs  % Usual Body Weight: 84%  BMI:  Body mass index is 16.1 kg/(m^2). Underweight  Estimated Nutritional Needs: Kcal: 1650-1850 Protein: 75-90 gram Fluid: >/=1800 ml/daily  Skin: WDL  Diet Order: General  EDUCATION NEEDS: -No education needs identified at this time   Intake/Output Summary (Last 24 hours) at 11/04/13 1200 Last data filed at 11/04/13 0600  Gross per 24 hour  Intake 426.25 ml  Output      0 ml  Net 426.25 ml    Last BM: 7/09   Labs:   Recent Labs Lab 11/01/13 0630 11/03/13 2220 11/04/13 0455  NA 131* 125* 128*  K 3.9 3.6* 3.8  CL 96 89* 93*  CO2 23 22 23   BUN 8 12 11   CREATININE 0.58 0.70  0.77  CALCIUM 7.4* 8.1* 7.6*  GLUCOSE 113* 131* 122*    CBG (last 3)  No results found for this basename: GLUCAP,  in the last 72 hours  Scheduled Meds: . sodium chloride   Intravenous Once  . antiseptic oral rinse  15 mL Mouth Rinse BID  . ceFEPime (MAXIPIME) IV  2 g Intravenous 3 times per day  . chlorhexidine  15 mL Mouth/Throat BID  . dexamethasone  4 mg Oral BID  . heparin  5,000 Units Subcutaneous 3 times per day  . pantoprazole  40 mg Oral Daily    Continuous  Infusions: . sodium chloride 125 mL/hr at 11/04/13 1012    Past Medical History  Diagnosis Date  . Constipation due to pain medication   . Malignant neoplasm of base of tongue     left neck lymph and Left BOT cancer  . Current smoker   . 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 Testicle surgery      as a infant  . Appendectomy    . Mandible surgery      from [place]  . Back surgery      Lumbar  . Tonsillectomy    . Panendoscopy N/A 10/04/2013    Procedure: Direct Laryngoscopy  WITH BIOPSY;  Surgeon: Melida Quitter, MD;  Location: [facility];  Service: ENT;  Laterality: N/A;  . Esophagoscopy  10/04/2013    Procedure: ESOPHAGOSCOPY;  Surgeon: Melida Quitter, MD;  Location: [facility];  Service: ENT;;  . Multiple extractions with alveoloplasty N/A 10/08/2013    Procedure: extraction of tooth #'s 2,3,4,5,6,11,12,13,14,15,18,19,20,21,22,23,24,25,26, 27,28, 29, 31 with alveoloplasty ;  Surgeon: Lenn Cal, DDS;  Location: [facility];  Service: Oral Surgery;  Laterality: N/A;    Atlee Abide MS RD LDN Clinical Dietitian [phone]

## 2013-11-04 NOTE — Care Management Note (Signed)
    Page 1 of 1   11/04/2013     2:48:58 PM CARE MANAGEMENT NOTE 11/04/2013  Patient:  Angel [name], Angel [name]   Account Number:  [id number]  Date Initiated:  11/04/2013  Documentation initiated by:  Dessa Phi  Subjective/Objective Assessment:   63 Y/O M ADMITTED W/NEUTROPENIC FEVER.READMIT 6/29-11/01/13-HYPERCALCEMIA.XB:WIOMBTDHR TONGUE CA.     Action/Plan:   FROM HOME W/SPOUSE.PCP-DR.Hoagland.   Anticipated DC Date:  11/08/2013   Anticipated DC Plan:  [place]  In-house referral  Financial Counselor      DC Planning Services  CM consult      Choice offered to / List presented to:  C-3 Spouse           Status of service:   Medicare Important Message given?   (If response is "NO", the following Medicare IM given date fields will be blank) Date Medicare IM given:   Medicare IM given by:   Date Additional Medicare IM given:   Additional Medicare IM given by:    Discharge Disposition:    Per UR Regulation:  Reviewed for med. necessity/level of care/duration of stay  If discussed at [place] of Stay Meetings, dates discussed:    Comments:  11/04/13 Sherral Dirocco RN,BSN NCM [place] PENDING.[facility] CHOSEN FOR HHRN-DISEASE MGMNT/READMIT.TC [facility] REP KRISTEN AWARE OF REFERRAL.RECOMMEND PT CONS.AWAIT FINAL [facility] ORDERS.

## 2013-11-05 ENCOUNTER — Ambulatory Visit: Payer: Self-pay

## 2013-11-05 DIAGNOSIS — K5289 Other specified noninfective gastroenteritis and colitis: Secondary | ICD-10-CM

## 2013-11-05 LAB — GI PATHOGEN PANEL BY PCR, STOOL
C difficile toxin A/B: NEGATIVE
CRYPTOSPORIDIUM BY PCR: NEGATIVE
Campylobacter by PCR: NEGATIVE
E coli (ETEC) LT/ST: NEGATIVE
E coli (STEC): NEGATIVE
E coli 0157 by PCR: NEGATIVE
G LAMBLIA BY PCR: NEGATIVE
NOROVIRUS G1/G2: NEGATIVE
Rotavirus A by PCR: NEGATIVE
SALMONELLA BY PCR: NEGATIVE
Shigella by PCR: NEGATIVE

## 2013-11-05 LAB — BASIC METABOLIC PANEL
Anion gap: 12 (ref 5–15)
BUN: 10 mg/dL (ref 6–23)
CALCIUM: 6.9 mg/dL — AB (ref 8.4–10.5)
CO2: 22 mEq/L (ref 19–32)
Chloride: 98 mEq/L (ref 96–112)
Creatinine, Ser: 0.62 mg/dL (ref 0.50–1.35)
GFR calc non Af Amer: >90 mL/min (ref >90)
GLUCOSE: 137 mg/dL — AB (ref 70–99)
POTASSIUM: 3.3 meq/L — AB (ref 3.7–5.3)
Sodium: 132 mEq/L — ABNORMAL LOW (ref 137–147)

## 2013-11-05 LAB — CBC WITH DIFFERENTIAL/PLATELET
BASOS PCT: 0 % (ref 0–1)
Basophils Absolute: 0 10*3/uL (ref 0.0–0.1)
Eosinophils Absolute: 0 10*3/uL (ref 0.0–0.7)
Eosinophils Relative: 1 % (ref 0–5)
HCT: 25.6 % — ABNORMAL LOW (ref 39.0–52.0)
HEMOGLOBIN: 8.8 g/dL — AB (ref 13.0–17.0)
LYMPHS PCT: 29 % (ref 12–46)
Lymphs Abs: 0.3 10*3/uL — ABNORMAL LOW (ref 0.7–4.0)
MCH: 32.2 pg (ref 26.0–34.0)
MCHC: 34.4 g/dL (ref 30.0–36.0)
MCV: 93.8 fL (ref 78.0–100.0)
MONO ABS: 0.4 10*3/uL (ref 0.1–1.0)
Monocytes Relative: 35 % — ABNORMAL HIGH (ref 3–12)
NEUTROS ABS: 0.5 10*3/uL — AB (ref 1.7–7.7)
NEUTROS PCT: 35 % — AB (ref 43–77)
Platelets: 116 10*3/uL — ABNORMAL LOW (ref 150–400)
RBC: 2.73 MIL/uL — ABNORMAL LOW (ref 4.22–5.81)
RDW: 14 % (ref 11.5–15.5)
WBC: 1.2 10*3/uL — CL (ref 4.0–10.5)

## 2013-11-05 MED ORDER — POTASSIUM CHLORIDE IN NACL 20-0.9 MEQ/L-% IV SOLN
INTRAVENOUS | Status: DC
  Administered 2013-11-05 (×2): via INTRAVENOUS
  Filled 2013-11-05 (×4): qty 1000

## 2013-11-05 MED ORDER — METRONIDAZOLE 500 MG PO TABS
500.0000 mg | ORAL_TABLET | Freq: Three times a day (TID) | ORAL | Status: DC
  Administered 2013-11-05 – 2013-11-07 (×7): 500 mg via ORAL
  Filled 2013-11-05 (×10): qty 1

## 2013-11-05 NOTE — Progress Notes (Signed)
[facility] PROGRESS NOTE  Angel [name] TOI:[id number] DOB: 1950/11/02 DOA: 11/03/2013 PCP: [name], MD  Assessment/Plan: 63 y.o. 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 Neutropenic fever/sirs; hemodynamically stable; CXR no clear infiltartes -afebrile 24 hrs; cont IV atx, blood cultures: NGTD; cont IVF; monitor   2. Abdominal pain, diarrhea; colitis; -neg c diff; cont IVF, antiemetics; add metronidazole; pend stool PCR; monitor  3. Hypo Na, hypo K likely due to chemo; replace;  cont IVF, monitor  4. Squamous cell carcinoma of the tongue, lymph node metastasis/neck pain, s/p chemotherapy with Taxotere, cis-platinum, and 5-FU -per oncology  5. Tobacco use; ? underlying COPD: no wheezing on exam;  -CXR no clear infiltrates; cont inhalers prn; PFTs outpatient     Code Status: full Family Communication:  D/w patient, his wife  (indicate person spoken with, relationship, and if by phone, the number) Disposition Plan: home pend clinical improvement    Consultants:  Oncology   Procedures:  none  Antibiotics:  Cefepime 7/8<<<<  vanc 7/8<<<<<   (indicate start date, and stop date if known)  HPI/Subjective: alert  Objective: Filed Vitals:   11/05/13 0659  BP: 137/76  Pulse: 67  Temp: 99 F (37.2 C)  Resp: 20    Intake/Output Summary (Last 24 hours) at 11/05/13 0929 Last data filed at 11/05/13 0651  Gross per 24 hour  Intake 1991.25 ml  Output      0 ml  Net 1991.25 ml   Filed Weights   11/04/13 0048  Weight: 44.589 kg (98 lb 4.8 oz)    Exam:   General:  alert  Cardiovascular: s1,s2 rrr  Respiratory: CTA BL  Abdomen: soft, mild diffuse tender    Musculoskeletal: no LE edema    Data Reviewed: Basic Metabolic Panel:  Recent Labs Lab 10/31/13 0545 11/01/13 0630 11/03/13 2220 11/04/13 0455 11/05/13 0502  NA 134* 131* 125* 128* 132*  K 3.7 3.9 3.6* 3.8 3.3*  CL 96 96 89* 93* 98  CO2 25 23 22 23 22   GLUCOSE 102* 113* 131* 122* 137*  BUN 9 8 12 11 10   CREATININE 0.60 0.58 0.70 0.77 0.62  CALCIUM 7.5* 7.4* 8.1* 7.6* 6.9*   Liver Function Tests:  Recent Labs Lab 11/03/13 2220  AST 7  ALT 9  ALKPHOS 53  BILITOT 0.6  PROT 5.6*  ALBUMIN 2.5*    Recent Labs Lab 11/03/13 2220  LIPASE 14   No results found for this basename: AMMONIA,  in the last 168 hours CBC:  Recent Labs Lab 10/31/13 0545 11/01/13 0630 11/03/13 2220 11/04/13 0455 11/05/13 0502  WBC 20.1* 9.3 0.9* 1.2* 1.2*  NEUTROABS  --   --  0.1*  --  0.5*  HGB 11.4* 11.1* 10.5* 10.5* 8.8*  HCT 33.6* 32.1* 31.0* 30.9* 25.6*  MCV 96.0 95.0 94.2 94.8 93.8  PLT 298 268 164 146* 116*   Cardiac Enzymes: No results found for this basename: CKTOTAL, CKMB, CKMBINDEX, TROPONINI,  in the last 168 hours BNP (last 3 results) No results found for this basename: PROBNP,  in the last 8760 hours CBG: No results found for this basename: GLUCAP,  in the last 168 hours  Recent Results (from the past 240 hour(s))  CULTURE, BLOOD (ROUTINE X 2)  Status: None   Collection Time    11/03/13 10:20 PM      Result Value Ref Range Status   Specimen Description BLOOD RIGHT UPPER ARM   Final   Special Requests BOTTLES DRAWN AEROBIC AND ANAEROBIC 3CC   Final   Culture  Setup Time     Final   Value: 11/04/2013 03:55     Performed at [facility]   Culture     Final   Value:        BLOOD CULTURE RECEIVED NO GROWTH TO DATE CULTURE WILL BE HELD FOR 5 DAYS BEFORE ISSUING A FINAL NEGATIVE REPORT     Performed at [facility]   Report Status PENDING   Incomplete  CULTURE, BLOOD (ROUTINE X 2)     Status: None   Collection Time    11/03/13 11:07 PM       Result Value Ref Range Status   Specimen Description BLOOD LEFT ANTECUBITAL   Final   Special Requests BOTTLES DRAWN AEROBIC AND ANAEROBIC 6ML EACH   Final   Culture  Setup Time     Final   Value: 11/04/2013 03:55     Performed at [facility]   Culture     Final   Value:        BLOOD CULTURE RECEIVED NO GROWTH TO DATE CULTURE WILL BE HELD FOR 5 DAYS BEFORE ISSUING A FINAL NEGATIVE REPORT     Performed at [facility]   Report Status PENDING   Incomplete  CLOSTRIDIUM DIFFICILE BY PCR     Status: None   Collection Time    11/04/13  1:11 AM      Result Value Ref Range Status   C difficile by pcr NEGATIVE  NEGATIVE Final   Comment: Performed at [facility]     Studies: Dg Chest 2 View  11/03/2013   CLINICAL DATA:  Fever and nausea  EXAM: CHEST  2 VIEW  COMPARISON:  None.  FINDINGS: Shallow inspiration with elevation of left hemidiaphragm. Normal heart size and pulmonary vascularity. Right PICC catheter with tip over the cavoatrial junction. No focal airspace disease or consolidation in the lungs. No blunting of costophrenic angles. No pneumothorax. Hilar and mediastinal contours appear symmetrical. Prominent soft tissue in the left side of the neck corresponding with known mass.  IMPRESSION: Elevation of left hemidiaphragm. No evidence of active pulmonary disease.   Electronically Signed   By: Lucienne Capers M.D.   On: 11/03/2013 23:27   Ct Abdomen Pelvis W Contrast  11/04/2013   CLINICAL DATA:  Leukocytosis, fever, diarrhea, past history tongue cancer February 2015 with ongoing chemotherapy, smoking history, appendectomy  EXAM: CT ABDOMEN AND PELVIS WITH CONTRAST  TECHNIQUE: Multidetector CT imaging of the abdomen and pelvis was performed using the standard protocol following bolus administration of intravenous contrast.  CONTRAST:  90mL OMNIPAQUE IOHEXOL 300 MG/ML  SOLN  COMPARISON:  PET-CT 10/15/2013  FINDINGS: Port-A-Cath tip RIGHT atrium.  Elevation of LEFT diaphragm.   Lung bases clear.  Liver, spleen, pancreas, kidneys, and adrenal glands normal appearance.  Gastric wall appears thickened question artifact from underdistention versus true wall thickening.  Diffuse edema and wall thickening of colon throughout its length compatible with colitis.  Small bowel loops unremarkable.  Scattered atherosclerotic calcification without aneurysm.  Unremarkable bladder and ureters.  Question tiny umbilical hernia containing fat.  Tiny foci of subcutaneous gas anterior abdominal wall at upper pelvis question medication injection site.  No intra-abdominal mass,  adenopathy, free fluid or free air.  Extensive degenerative disc disease changes and sclerosis at L4-L5 and L5-S1.  No definite acute osseous findings.  IMPRESSION: Diffuse colonic wall thickening compatible with colitis.  Differential diagnosis includes infection or inflammatory bowel disease, with ischemia considered unlikely due to length of involvement.  Unable to exclude gastric wall thickening though this could be an artifact from underdistention.  Questionable tiny umbilical hernia containing fat.   Electronically Signed   By: Lavonia Dana M.D.   On: 11/04/2013 12:35    Scheduled Meds: . sodium chloride   Intravenous Once  . antiseptic oral rinse  15 mL Mouth Rinse BID  . ceFEPime (MAXIPIME) IV  2 g Intravenous 3 times per day  . chlorhexidine  15 mL Mouth/Throat BID  . feeding supplement (ENSURE COMPLETE)  237 mL Oral BID BM  . heparin  5,000 Units Subcutaneous 3 times per day  . pantoprazole  40 mg Oral Daily   Continuous Infusions: . sodium chloride 125 mL/hr at 11/05/13 0404    Principal Problem:   Neutropenic fever Active Problems:   Cancer of base of tongue    Time spent: >35    Kinnie Feil  [facility] Pager [phone]. If 7PM-7AM, please contact night-coverage at [email or web address], password [facility] 11/05/2013, 9:29 AM  LOS: 2 days

## 2013-11-05 NOTE — Progress Notes (Signed)
PT Cancellation Note  Patient Details Name: Angel [name] MRN: [id number] DOB: July 02, 1950   Cancelled Treatment:    Reason Eval/Treat Not Completed: Fatigue/lethargy limiting ability to participate Pt reports just getting comfortable in bed however agreeable to PT tomorrow as he reports he likely won't d/c until at least Tuesday.   Cristella Stiver,KATHrine E 11/05/2013, 11:38 AM Carmelia Bake, PT, DPT 11/05/2013 Pager: [phone]

## 2013-11-06 ENCOUNTER — Other Ambulatory Visit: Payer: Self-pay | Admitting: Emergency Medicine

## 2013-11-06 ENCOUNTER — Ambulatory Visit: Payer: Self-pay

## 2013-11-06 DIAGNOSIS — E876 Hypokalemia: Secondary | ICD-10-CM

## 2013-11-06 LAB — CBC WITH DIFFERENTIAL/PLATELET
BASOS ABS: 0 10*3/uL (ref 0.0–0.1)
Basophils Relative: 0 % (ref 0–1)
Eosinophils Absolute: 0 10*3/uL (ref 0.0–0.7)
Eosinophils Relative: 1 % (ref 0–5)
HEMATOCRIT: 26.8 % — AB (ref 39.0–52.0)
Hemoglobin: 9.4 g/dL — ABNORMAL LOW (ref 13.0–17.0)
LYMPHS PCT: 30 % (ref 12–46)
Lymphs Abs: 1.1 10*3/uL (ref 0.7–4.0)
MCH: 32.3 pg (ref 26.0–34.0)
MCHC: 35.1 g/dL (ref 30.0–36.0)
MCV: 92.1 fL (ref 78.0–100.0)
MONO ABS: 0.7 10*3/uL (ref 0.1–1.0)
Monocytes Relative: 19 % — ABNORMAL HIGH (ref 3–12)
NEUTROS ABS: 1.9 10*3/uL (ref 1.7–7.7)
NEUTROS PCT: 50 % (ref 43–77)
Platelets: 156 10*3/uL (ref 150–400)
RBC: 2.91 MIL/uL — ABNORMAL LOW (ref 4.22–5.81)
RDW: 14 % (ref 11.5–15.5)
WBC: 3.8 10*3/uL — AB (ref 4.0–10.5)

## 2013-11-06 LAB — BASIC METABOLIC PANEL
ANION GAP: 14 (ref 5–15)
BUN: 11 mg/dL (ref 6–23)
CHLORIDE: 98 meq/L (ref 96–112)
CO2: 21 meq/L (ref 19–32)
Calcium: 7 mg/dL — ABNORMAL LOW (ref 8.4–10.5)
Creatinine, Ser: 0.63 mg/dL (ref 0.50–1.35)
GFR calc Af Amer: >90 mL/min (ref >90)
GFR calc non Af Amer: >90 mL/min (ref >90)
Glucose, Bld: 99 mg/dL (ref 70–99)
POTASSIUM: 3.5 meq/L — AB (ref 3.7–5.3)
Sodium: 133 mEq/L — ABNORMAL LOW (ref 137–147)

## 2013-11-06 NOTE — Evaluation (Signed)
Physical Therapy One Time Evaluation Patient Details Name: BENIAH MAGNAN MRN: [id number] DOB: [birth year] Today's Date: 11/06/2013   History of Present Illness  NURI LARMER is an 63 y.o. male with a PMH of recently diagnosed squamous cell carcinoma of the tongue with ymph node metastasis/neck pain s/p chemotherapy who was admitted 11/03/13 for neutropenic fever, abdominal pain, diarrhea.  Clinical Impression  Patient evaluated by Physical Therapy with no further acute PT needs identified. All education has been completed and the patient has no further questions. Pt reports currently feeling much better and agreeable to ambulate.  No f/u needs identified as pt performing mobility well, which he agrees. PT is signing off. Thank you for this referral.     Follow Up Recommendations No PT follow up    Equipment Recommendations  None recommended by PT    Recommendations for Other Services       Precautions / Restrictions Precautions Precautions: Fall      Mobility  Bed Mobility Overal bed mobility: Modified Independent                Transfers Overall transfer level: Modified independent               General transfer comment: Pt and NT report pt getting around room and to/from bathroom on his own  Ambulation/Gait Ambulation/Gait assistance: Supervision;Modified independent (Device/Increase time) Ambulation Distance (Feet): 400 Feet Assistive device: None Gait Pattern/deviations: Decreased stride length;Step-through pattern Gait velocity: WFL   General Gait Details: pt ambulated with and without IV pole, no unsteady gait observed, HR 102 during ambulation  Stairs            Wheelchair Mobility    Modified Rankin (Stroke Patients Only)       Balance Overall balance assessment: History of Falls                                           Pertinent Vitals/Pain No c/o pain    Home Living Family/patient expects to be discharged to::  Private residence Living Arrangements: Spouse/significant other Available Help at Discharge: Family;Available 24 hours/day Type of Home: House Home Access: Stairs to enter   [employer] of Steps: 3 Home Layout: One level Home Equipment: Walker - 2 wheels      Prior Function Level of Independence: Independent               Hand Dominance        Extremity/Trunk Assessment               Lower Extremity Assessment: Overall WFL for tasks assessed      Cervical / Trunk Assessment: Other exceptions  Communication   Communication: No difficulties  Cognition Arousal/Alertness: Awake/alert Behavior During Therapy: WFL for tasks assessed/performed Overall Cognitive Status: Within Functional Limits for tasks assessed                      General Comments General comments (skin integrity, edema, etc.): pt reports hx of last fall was due to sock slipping on toilet surface - denies injury, hitting head or floor    Exercises        Assessment/Plan    PT Assessment Patent does not need any further PT services  PT Diagnosis     PT Problem List    PT Treatment Interventions     PT Goals (  Current goals can be found in the Care Plan section) Acute Rehab PT Goals PT Goal Formulation: No goals set, d/c therapy    Frequency     Barriers to discharge        Co-evaluation               End of Session   Activity Tolerance: Patient tolerated treatment well Patient left: in bed;with call bell/phone within reach Nurse Communication: Mobility status         Time: 1130-1144 PT Time Calculation (min): 14 min   Charges:   PT Evaluation $Initial PT Evaluation Tier I: 1 Procedure PT Treatments $Gait Training: 8-22 mins   PT G Codes:          Melaya [place]elton,KATHrine E 11/06/2013, 12:57 PM Carmelia Bake, PT, DPT 11/06/2013 Pager: [phone]

## 2013-11-06 NOTE — Progress Notes (Signed)
[facility] PROGRESS NOTE  Angel [name] ZCH:[id number] DOB: 1950-07-16 DOA: 11/03/2013 PCP: [name], MD  Assessment/Plan: 63 y.o. 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 Neutropenic fever/sirs; hemodynamically stable; CXR no clear infiltartes -afebrile 48 hrs; cont IV atx, blood cultures: NGTD; change to PO atx in AM; monitor   2. Abdominal pain, diarrhea; colitis likely chemo induced -diarrhea improving; neg c diff; neg stool pCR; cont IVF, antiemetics; added metronidazole; 3. Hypo Na, hypo K likely due to chemo; replace;  cont IVF, monitor  4. Squamous cell carcinoma of the tongue, lymph node metastasis/neck pain, s/p chemotherapy with Taxotere, cis-platinum, and 5-FU -per oncology  5. Tobacco use; ? underlying COPD: no wheezing on exam;  -CXR no clear infiltrates; cont inhalers prn; PFTs outpatient  6. Mild confusion, ? Opioid related vs underling infection;  -neuro exam no focal; monitor   Pend PT   Code Status: full Family Communication:  D/w patient, his wife  (indicate person spoken with, relationship, and if by phone, the number) Disposition Plan: home pend clinical improvement; 24-48 hrs    Consultants:  Oncology   Procedures:  none  Antibiotics:  Cefepime 7/8<<<<  vanc 7/8<<<<<   (indicate start date, and stop date if known)  HPI/Subjective: alert  Objective: Filed Vitals:   11/06/13 0300  BP: 116/65  Pulse: 81  Temp: 97.5 F (36.4 C)  Resp: 20    Intake/Output Summary (Last 24 hours) at 11/06/13 0948 Last data filed at 11/06/13 0700  Gross per 24 hour  Intake 2903.34 ml  Output      0 ml  Net 2903.34 ml   Filed Weights   11/04/13 0048   Weight: 44.589 kg (98 lb 4.8 oz)    Exam:   General:  alert  Cardiovascular: s1,s2 rrr  Respiratory: CTA BL  Abdomen: soft, mild diffuse tender   Musculoskeletal: no LE edema    Data Reviewed: Basic Metabolic Panel:  Recent Labs Lab 11/01/13 0630 11/03/13 2220 11/04/13 0455 11/05/13 0502 11/06/13 0445  NA 131* 125* 128* 132* 133*  K 3.9 3.6* 3.8 3.3* 3.5*  CL 96 89* 93* 98 98  CO2 23 22 23 22 21   GLUCOSE 113* 131* 122* 137* 99  BUN 8 12 11 10 11   CREATININE 0.58 0.70 0.77 0.62 0.63  CALCIUM 7.4* 8.1* 7.6* 6.9* 7.0*   Liver Function Tests:  Recent Labs Lab 11/03/13 2220  AST 7  ALT 9  ALKPHOS 53  BILITOT 0.6  PROT 5.6*  ALBUMIN 2.5*    Recent Labs Lab 11/03/13 2220  LIPASE 14   No results found for this basename: AMMONIA,  in the last 168 hours CBC:  Recent Labs Lab 11/01/13 0630 11/03/13 2220 11/04/13 0455 11/05/13 0502 11/06/13 0445  WBC 9.3 0.9* 1.2* 1.2* 3.8*  NEUTROABS  --  0.1*  --  0.5* 1.9  HGB 11.1* 10.5* 10.5* 8.8* 9.4*  HCT 32.1* 31.0* 30.9* 25.6* 26.8*  MCV 95.0 94.2 94.8 93.8 92.1  PLT 268 164 146* 116* 156   Cardiac Enzymes: No results found for this basename: CKTOTAL, CKMB, CKMBINDEX, TROPONINI,  in the last 168 hours BNP (last 3 results) No results found for this basename: PROBNP,  in the last 8760 hours CBG: No results found for  this basename: GLUCAP,  in the last 168 hours  Recent Results (from the past 240 hour(s))  CULTURE, BLOOD (ROUTINE X 2)     Status: None   Collection Time    11/03/13 10:20 PM      Result Value Ref Range Status   Specimen Description BLOOD RIGHT UPPER ARM   Final   Special Requests BOTTLES DRAWN AEROBIC AND ANAEROBIC 3CC   Final   Culture  Setup Time     Final   Value: 11/04/2013 03:55     Performed at [facility]   Culture     Final   Value:        BLOOD CULTURE RECEIVED NO GROWTH TO DATE CULTURE WILL BE HELD FOR 5 DAYS BEFORE ISSUING A FINAL NEGATIVE REPORT     Performed at  [facility]   Report Status PENDING   Incomplete  CULTURE, BLOOD (ROUTINE X 2)     Status: None   Collection Time    11/03/13 11:07 PM      Result Value Ref Range Status   Specimen Description BLOOD LEFT ANTECUBITAL   Final   Special Requests BOTTLES DRAWN AEROBIC AND ANAEROBIC 6ML EACH   Final   Culture  Setup Time     Final   Value: 11/04/2013 03:55     Performed at [facility]   Culture     Final   Value:        BLOOD CULTURE RECEIVED NO GROWTH TO DATE CULTURE WILL BE HELD FOR 5 DAYS BEFORE ISSUING A FINAL NEGATIVE REPORT     Performed at [facility]   Report Status PENDING   Incomplete  CLOSTRIDIUM DIFFICILE BY PCR     Status: None   Collection Time    11/04/13  1:11 AM      Result Value Ref Range Status   C difficile by pcr NEGATIVE  NEGATIVE Final   Comment: Performed at [facility]     Studies: Ct Abdomen Pelvis W Contrast  11/04/2013   CLINICAL DATA:  Leukocytosis, fever, diarrhea, past history tongue cancer February 2015 with ongoing chemotherapy, smoking history, appendectomy  EXAM: CT ABDOMEN AND PELVIS WITH CONTRAST  TECHNIQUE: Multidetector CT imaging of the abdomen and pelvis was performed using the standard protocol following bolus administration of intravenous contrast.  CONTRAST:  6mL OMNIPAQUE IOHEXOL 300 MG/ML  SOLN  COMPARISON:  PET-CT 10/15/2013  FINDINGS: Port-A-Cath tip RIGHT atrium.  Elevation of LEFT diaphragm.  Lung bases clear.  Liver, spleen, pancreas, kidneys, and adrenal glands normal appearance.  Gastric wall appears thickened question artifact from underdistention versus true wall thickening.  Diffuse edema and wall thickening of colon throughout its length compatible with colitis.  Small bowel loops unremarkable.  Scattered atherosclerotic calcification without aneurysm.  Unremarkable bladder and ureters.  Question tiny umbilical hernia containing fat.  Tiny foci of subcutaneous gas anterior abdominal wall at upper pelvis  question medication injection site.  No intra-abdominal mass, adenopathy, free fluid or free air.  Extensive degenerative disc disease changes and sclerosis at L4-L5 and L5-S1.  No definite acute osseous findings.  IMPRESSION: Diffuse colonic wall thickening compatible with colitis.  Differential diagnosis includes infection or inflammatory bowel disease, with ischemia considered unlikely due to length of involvement.  Unable to exclude gastric wall thickening though this could be an artifact from underdistention.  Questionable tiny umbilical hernia containing fat.   Electronically Signed   By: Lavonia Dana M.D.   On:  11/04/2013 12:35    Scheduled Meds: . sodium chloride   Intravenous Once  . antiseptic oral rinse  15 mL Mouth Rinse BID  . ceFEPime (MAXIPIME) IV  2 g Intravenous 3 times per day  . chlorhexidine  15 mL Mouth/Throat BID  . feeding supplement (ENSURE COMPLETE)  237 mL Oral BID BM  . heparin  5,000 Units Subcutaneous 3 times per day  . metroNIDAZOLE  500 mg Oral 3 times per day  . pantoprazole  40 mg Oral Daily   Continuous Infusions: . 0.9 % NaCl with KCl 20 mEq / L 100 mL/hr at 11/05/13 2121    Principal Problem:   Neutropenic fever Active Problems:   Cancer of base of tongue    Time spent: >35    Kinnie Feil  [facility] Pager [phone]. If 7PM-7AM, please contact night-coverage at [email or web address], password [facility] 11/06/2013, 9:48 AM  LOS: 3 days

## 2013-11-07 MED ORDER — ENSURE COMPLETE PO LIQD: 237.0000 mL | Freq: Two times a day (BID) | ORAL | Status: DC

## 2013-11-07 MED ORDER — ALBUTEROL SULFATE (2.5 MG/3ML) 0.083% IN NEBU: 2.5000 mg | INHALATION_SOLUTION | Freq: Four times a day (QID) | RESPIRATORY_TRACT | Status: DC | PRN

## 2013-11-07 MED ORDER — HEPARIN SOD (PORK) LOCK FLUSH 100 UNIT/ML IV SOLN
250.0000 [IU] | INTRAVENOUS | Status: AC | PRN
  Administered 2013-11-07: 250 [IU]

## 2013-11-07 MED ORDER — METRONIDAZOLE 500 MG PO TABS: 500.0000 mg | ORAL_TABLET | Freq: Three times a day (TID) | ORAL | Status: DC

## 2013-11-07 MED ORDER — LEVOFLOXACIN 500 MG PO TABS: 500.0000 mg | ORAL_TABLET | Freq: Every day | ORAL | Status: DC

## 2013-11-07 MED ORDER — LACTULOSE 10 GM/15ML PO SOLN: 10.0000 g | Freq: Two times a day (BID) | ORAL | Status: DC | PRN

## 2013-11-07 NOTE — Discharge Summary (Addendum)
Physician Discharge Summary  Angel [name] [id number]:[id number] DOB: 08/19/1950 DOA: 11/03/2013  PCP: [name], MD  Admit date: 11/03/2013 Discharge date: 11/07/2013  Time spent: >35 minutes  Recommendations for Outpatient Follow-up:  HHC F/u with oncology in 4-5 days F/u with ENT in 1-2 week s F/u with PCP in 1-2 weeks   Discharge Diagnoses:  Principal Problem:   Neutropenic fever Active Problems:   Cancer of base of tongue   Discharge Condition: stable   Diet recommendation: low sodium   Filed Weights   11/04/13 0048  Weight: 44.589 kg (98 lb 4.8 oz)    History of present illness:  63 y.o. male with a PMH of recently diagnosed squamous cell carcinoma of the tongue, lymph node metastasis/neck pain, s/p chemotherapy with Taxotere, cis-platinum, and 5-FU, recent hyper Ca (s/p Decadron, pamidronate, IVF+lasix) initiated tobacco use, constipation, anemia discharged on 7/6 presented with fever, abdominal pain, diarrhea    Hospital Course:  1. Neutropenic fever/sirs; hemodynamically stable; CXR no clear infiltartes  -resolved; afebrile 48 hrs; WBC improved; Pt received empiric cefepime+vanc; blood cultures: NGTD;  2. Abdominal pain, diarrhea; CT abd : +colitis likely chemo induced;  -diarrhea, abdominal pain improved on atx flagyl+levoflox; neg c diff; neg stool pCR;  3. Hypo Na, hypo K likely due to chemo; replaced;   4. Squamous cell carcinoma of the tongue, lymph node metastasis/neck pain, s/p chemotherapy with Taxotere, cis-platinum, and 5-FU  -per oncology, outpatient follow up  5. Tobacco use; ? underlying COPD: no wheezing on exam;  -CXR no clear infiltrates; cont inhalers prn; PFTs outpatient  6. Mild confusion, ? Opioid related vs underling infection;  -neuro exam no focal; resolved    Procedures:  none (i.e. Studies not automatically included, echos, thoracentesis, etc; not x-rays)  Consultations:  Oncology   Discharge Exam: Filed Vitals:   11/07/13 0455  BP:  114/77  Pulse: 92  Temp: 98.2 F (36.8 C)  Resp: 18    General: alert Cardiovascular: s1,s2 rrr Respiratory: CTA BL  Discharge Instructions  Discharge Instructions   Diet - low sodium heart healthy    Complete by:  As directed      Discharge instructions    Complete by:  As directed   Please follow up with oncology in 4-5 days  Please follow up with ENT in 1-2 week s Please follow up with primary care doctor in 1-2 weeks     Increase activity slowly    Complete by:  As directed             Medication List         acetaminophen 325 MG tablet  Commonly known as:  TYLENOL  Take 650 mg by mouth every 6 (six) hours as needed for mild pain or moderate pain.     albuterol (2.5 MG/3ML) 0.083% nebulizer solution  Commonly known as:  PROVENTIL  Take 3 mLs (2.5 mg total) by nebulization every 6 (six) hours as needed for wheezing or shortness of breath.     antiseptic oral rinse Liqd  15 mLs by Mouth Rinse route 2 (two) times daily.     chlorhexidine 0.12 % solution  Commonly known as:  PERIDEX  Rinse with 15 mls twice daily for 30 seconds. Use after breakfast and at bedtime. Spit out excess. Do not swallow.     dexamethasone 4 MG tablet  Commonly known as:  DECADRON  Start the day before Taxotere. Take once the day after, then 2 times a day x 2 days.  Repeat in 3 weeks     feeding supplement (ENSURE COMPLETE) Liqd  Take 237 mLs by mouth 2 (two) times daily between meals.     lactulose 10 GM/15ML solution  Commonly known as:  CHRONULAC  Take 15 mLs (10 g total) by mouth 2 (two) times daily as needed for mild constipation.     levofloxacin 500 MG tablet  Commonly known as:  LEVAQUIN  Take 1 tablet (500 mg total) by mouth daily.     metroNIDAZOLE 500 MG tablet  Commonly known as:  FLAGYL  Take 1 tablet (500 mg total) by mouth every 8 (eight) hours.     morphine CONCENTRATE 10 mg / 0.5 ml concentrated solution  Take 0.25 mLs (5 mg total) by mouth every 2 (two) hours  as needed for severe pain.     ondansetron 8 MG tablet  Commonly known as:  ZOFRAN  Take 1 tablet (8 mg total) by mouth every 8 (eight) hours as needed for nausea or vomiting (Nausea or vomiting).     pantoprazole 40 MG tablet  Commonly known as:  PROTONIX  Take 1 tablet (40 mg total) by mouth daily.     polyethylene glycol packet  Commonly known as:  MIRALAX / GLYCOLAX  Take 17 g by mouth daily.     prochlorperazine 10 MG tablet  Commonly known as:  COMPAZINE  Take 1 tablet (10 mg total) by mouth every 6 (six) hours as needed (Nausea or vomiting).     senna-docusate 8.6-50 MG per tablet  Commonly known as:  Senokot-S  Take 2 tablets by mouth 2 (two) times daily.       No Known Allergies     Follow-up Information   Follow up with [facility], NI, MD. Schedule an appointment as soon as possible for a visit in 4 days.   Specialty:  Hematology and Oncology   Contact information:   [place] [id number] [phone]        The results of significant diagnostics from this hospitalization (including imaging, microbiology, ancillary and laboratory) are listed below for reference.    Significant Diagnostic Studies: Dg Chest 2 View  11/03/2013   CLINICAL DATA:  Fever and nausea  EXAM: CHEST  2 VIEW  COMPARISON:  None.  FINDINGS: Shallow inspiration with elevation of left hemidiaphragm. Normal heart size and pulmonary vascularity. Right PICC catheter with tip over the cavoatrial junction. No focal airspace disease or consolidation in the lungs. No blunting of costophrenic angles. No pneumothorax. Hilar and mediastinal contours appear symmetrical. Prominent soft tissue in the left side of the neck corresponding with known mass.  IMPRESSION: Elevation of left hemidiaphragm. No evidence of active pulmonary disease.   Electronically Signed   By: Lucienne Capers M.D.   On: 11/03/2013 23:27   Ct Abdomen Pelvis W Contrast  11/04/2013   CLINICAL DATA:  Leukocytosis, fever,  diarrhea, past history tongue cancer February 2015 with ongoing chemotherapy, smoking history, appendectomy  EXAM: CT ABDOMEN AND PELVIS WITH CONTRAST  TECHNIQUE: Multidetector CT imaging of the abdomen and pelvis was performed using the standard protocol following bolus administration of intravenous contrast.  CONTRAST:  43mL OMNIPAQUE IOHEXOL 300 MG/ML  SOLN  COMPARISON:  PET-CT 10/15/2013  FINDINGS: Port-A-Cath tip RIGHT atrium.  Elevation of LEFT diaphragm.  Lung bases clear.  Liver, spleen, pancreas, kidneys, and adrenal glands normal appearance.  Gastric wall appears thickened question artifact from underdistention versus true wall thickening.  Diffuse edema and wall thickening of  colon throughout its length compatible with colitis.  Small bowel loops unremarkable.  Scattered atherosclerotic calcification without aneurysm.  Unremarkable bladder and ureters.  Question tiny umbilical hernia containing fat.  Tiny foci of subcutaneous gas anterior abdominal wall at upper pelvis question medication injection site.  No intra-abdominal mass, adenopathy, free fluid or free air.  Extensive degenerative disc disease changes and sclerosis at L4-L5 and L5-S1.  No definite acute osseous findings.  IMPRESSION: Diffuse colonic wall thickening compatible with colitis.  Differential diagnosis includes infection or inflammatory bowel disease, with ischemia considered unlikely due to length of involvement.  Unable to exclude gastric wall thickening though this could be an artifact from underdistention.  Questionable tiny umbilical hernia containing fat.   Electronically Signed   By: Lavonia Dana M.D.   On: 11/04/2013 12:35   Nm Pet Image Initial (pi) Skull Base To Thigh  10/15/2013   CLINICAL DATA:  Initial treatment strategy for tongue cancer.  EXAM: NUCLEAR MEDICINE PET SKULL BASE TO THIGH  TECHNIQUE: 6.4 mCi F-18 FDG was injected intravenously. Full-ring PET imaging was performed from the skull base to thigh after the  radiotracer. CT data was obtained and used for attenuation correction and anatomic localization.  FASTING BLOOD GLUCOSE:  Value: 147 mg/dl  COMPARISON:  None.  FINDINGS: NECK  Large base of tongue mass measures 4.7 cm and has an SUV max equal 14.6. Large, necrotic lymph node within the left side of neck measures 8.1 x 7.8 cm and has an SUV max equal to 14.5. Hypermetabolic right level 2 lymph node measures 2.3 cm and has an SUV max equal to 10. There is a small right-sided level 4 lymph node which measures 8 mm and has an SUV max equal to 2.8.  CHEST  No hypermetabolic mediastinal or hilar nodes. No suspicious pulmonary nodules on the CT scan. Clustered nodules within the posterior right upper lobe are identified, image 23/series 7. There is associated cylindrical type bronchiectasis. Mild changes of centrilobular emphysema noted. The heart size is normal. No pericardial effusion. There is mild calcified atherosclerotic disease involving the thoracic aorta. Calcification involving the LAD coronary artery noted.  ABDOMEN/PELVIS  No abnormal hypermetabolic activity within the liver, pancreas, adrenal glands, or spleen. No hypermetabolic lymph nodes in the abdomen or pelvis.  SKELETON  No focal hypermetabolic activity to suggest skeletal metastasis.  IMPRESSION: 1. Examination is positive for large, hypermetabolic base of tongue mass. 2. Bilateral cervical lymph node metastasis. 3. No evidence for hypermetabolic metastasis to the chest abdomen or pelvis. 4. Postinflammatory/infectious changes within the posterior right upper lobe. 5. Atherosclerotic disease including multi vessel coronary artery calcifications.   Electronically Signed   By: Kerby Moors M.D.   On: 10/15/2013 09:53   Ir Fluoro Guide Cv Line Right  10/26/2013   CLINICAL DATA:  History of squamous cell cancer of the oropharynx; central venous access is requested for chemotherapy.  EXAM: IR RIGHT FLOURO GUIDE CV LINE; IR ULTRASOUND GUIDANCE VASC  ACCESS RIGHT  FLUOROSCOPY TIME:  24 seconds  TECHNIQUE: The right arm was prepped with chlorhexidine, draped in the usual sterile fashion using maximum barrier technique (cap and mask, sterile gown, sterile gloves, large sterile sheet, hand hygiene and cutaneous antiseptic). Local anesthesia was attained by infiltration with 1% lidocaine.  Ultrasound demonstrated patency of the right brachial vein, and this was documented with an image. Under real-time ultrasound guidance, this vein was accessed with a 21 gauge micropuncture needle and image documentation was performed. The needle was exchanged  over a guidewire for a peel-away sheath through which a 39 cm 5 [place] double lumen power injectable PICC was advanced, and positioned with its tip at the lower SVC/right atrial junction. Fluoroscopy during the procedure and fluoro spot radiograph confirms appropriate catheter position. The catheter was flushed, secured to the skin with Prolene sutures, and covered with a sterile dressing.  COMPLICATIONS: None.  The patient tolerated the procedure well.  IMPRESSION: Successful placement of a right arm PICC with sonographic and fluoroscopic guidance. The catheter is ready for use.  Read by: Rowe Robert ,P.A.-C.   Electronically Signed   By: Aletta Edouard M.D.   On: 10/26/2013 14:14   Ir US Guide Vasc Access Right  10/26/2013   CLINICAL DATA:  History of squamous cell cancer of the oropharynx; central venous access is requested for chemotherapy.  EXAM: IR RIGHT FLOURO GUIDE CV LINE; IR ULTRASOUND GUIDANCE VASC ACCESS RIGHT  FLUOROSCOPY TIME:  24 seconds  TECHNIQUE: The right arm was prepped with chlorhexidine, draped in the usual sterile fashion using maximum barrier technique (cap and mask, sterile gown, sterile gloves, large sterile sheet, hand hygiene and cutaneous antiseptic). Local anesthesia was attained by infiltration with 1% lidocaine.  Ultrasound demonstrated patency of the right brachial vein, and this was  documented with an image. Under real-time ultrasound guidance, this vein was accessed with a 21 gauge micropuncture needle and image documentation was performed. The needle was exchanged over a guidewire for a peel-away sheath through which a 39 cm 5 [place] double lumen power injectable PICC was advanced, and positioned with its tip at the lower SVC/right atrial junction. Fluoroscopy during the procedure and fluoro spot radiograph confirms appropriate catheter position. The catheter was flushed, secured to the skin with Prolene sutures, and covered with a sterile dressing.  COMPLICATIONS: None.  The patient tolerated the procedure well.  IMPRESSION: Successful placement of a right arm PICC with sonographic and fluoroscopic guidance. The catheter is ready for use.  Read by: Rowe Robert ,P.A.-C.   Electronically Signed   By: Aletta Edouard M.D.   On: 10/26/2013 14:14    Microbiology: Recent Results (from the past 240 hour(s))  CULTURE, BLOOD (ROUTINE X 2)     Status: None   Collection Time    11/03/13 10:20 PM      Result Value Ref Range Status   Specimen Description BLOOD RIGHT UPPER ARM   Final   Special Requests BOTTLES DRAWN AEROBIC AND ANAEROBIC 3CC   Final   Culture  Setup Time     Final   Value: 11/04/2013 03:55     Performed at [facility]   Culture     Final   Value:        BLOOD CULTURE RECEIVED NO GROWTH TO DATE CULTURE WILL BE HELD FOR 5 DAYS BEFORE ISSUING A FINAL NEGATIVE REPORT     Performed at [facility]   Report Status PENDING   Incomplete  CULTURE, BLOOD (ROUTINE X 2)     Status: None   Collection Time    11/03/13 11:07 PM      Result Value Ref Range Status   Specimen Description BLOOD LEFT ANTECUBITAL   Final   Special Requests BOTTLES DRAWN AEROBIC AND ANAEROBIC 6ML EACH   Final   Culture  Setup Time     Final   Value: 11/04/2013 03:55     Performed at [facility]   Culture     Final   Value:  BLOOD CULTURE RECEIVED NO GROWTH TO DATE  CULTURE WILL BE HELD FOR 5 DAYS BEFORE ISSUING A FINAL NEGATIVE REPORT     Performed at [facility]   Report Status PENDING   Incomplete  CLOSTRIDIUM DIFFICILE BY PCR     Status: None   Collection Time    11/04/13  1:11 AM      Result Value Ref Range Status   C difficile by pcr NEGATIVE  NEGATIVE Final   Comment: Performed at [facility] [place] [facility]: Basic Metabolic Panel:  Recent Labs Lab 11/01/13 0630 11/03/13 2220 11/04/13 0455 11/05/13 0502 11/06/13 0445  NA 131* 125* 128* 132* 133*  K 3.9 3.6* 3.8 3.3* 3.5*  CL 96 89* 93* 98 98  CO2 23 22 23 22 21   GLUCOSE 113* 131* 122* 137* 99  BUN 8 12 11 10 11   CREATININE 0.58 0.70 0.77 0.62 0.63  CALCIUM 7.4* 8.1* 7.6* 6.9* 7.0*   Liver Function Tests:  Recent Labs Lab 11/03/13 2220  AST 7  ALT 9  ALKPHOS 53  BILITOT 0.6  PROT 5.6*  ALBUMIN 2.5*    Recent Labs Lab 11/03/13 2220  LIPASE 14   No results found for this basename: AMMONIA,  in the last 168 hours CBC:  Recent Labs Lab 11/01/13 0630 11/03/13 2220 11/04/13 0455 11/05/13 0502 11/06/13 0445  WBC 9.3 0.9* 1.2* 1.2* 3.8*  NEUTROABS  --  0.1*  --  0.5* 1.9  HGB 11.1* 10.5* 10.5* 8.8* 9.4*  HCT 32.1* 31.0* 30.9* 25.6* 26.8*  MCV 95.0 94.2 94.8 93.8 92.1  PLT 268 164 146* 116* 156   Cardiac Enzymes: No results found for this basename: CKTOTAL, CKMB, CKMBINDEX, TROPONINI,  in the last 168 hours BNP: BNP (last 3 results) No results found for this basename: PROBNP,  in the last 8760 hours CBG: No results found for this basename: GLUCAP,  in the last 168 hours     Signed:  Rowe Clack N  [facility] 11/07/2013, 9:59 AM    Addendum:  Severe malnutrition due to cancer; encourage Po intake; outpatient follow up   Germaine Ripp

## 2013-11-07 NOTE — Discharge Instructions (Signed)
Please follow up with oncology as outpatient in 4-5 days  Please follow up with ENT in 1-2 weeks

## 2013-11-07 NOTE — Progress Notes (Signed)
ANTIBIOTIC CONSULT NOTE - INITIAL  Pharmacy Consult for Cefepime Indication: Febrile Neutropenia  No Known Allergies  Patient Measurements: Height: 5' 5.5" (166.4 cm) Weight: 98 lb 4.8 oz (44.589 kg) IBW/kg (Calculated) : 62.65 Adjusted Body Weight:   Vital Signs: Temp: 98.2 F (36.8 C) (07/12 0455) Temp src: Oral (07/12 0455) BP: 114/77 mmHg (07/12 0455) Pulse Rate: 92 (07/12 0455) Intake/Output from previous day: 07/11 0701 - 07/12 0700 In: 920 [P.O.:720; IV Piggyback:200] Out: -  Intake/Output from this shift: Total I/O In: 600 [P.O.:600] Out: -   Labs:  Recent Labs  11/05/13 0502 11/06/13 0445  WBC 1.2* 3.8*  HGB 8.8* 9.4*  PLT 116* 156  CREATININE 0.62 0.63   Estimated Creatinine Clearance: 61.2 ml/min (by C-G formula based on Cr of 0.63). No results found for this basename: VANCOTROUGH, VANCOPEAK, VANCORANDOM, [place], GENTPEAK, GENTRANDOM, TOBRATROUGH, TOBRAPEAK, TOBRARND, AMIKACINPEAK, AMIKACINTROU, AMIKACIN,  in the last 72 hours   Microbiology: Recent Results (from the past 720 hour(s))  CULTURE, BLOOD (ROUTINE X 2)     Status: None   Collection Time    11/03/13 10:20 PM      Result Value Ref Range Status   Specimen Description BLOOD RIGHT UPPER ARM   Final   Special Requests BOTTLES DRAWN AEROBIC AND ANAEROBIC 3CC   Final   Culture  Setup Time     Final   Value: 11/04/2013 03:55     Performed at [facility]   Culture     Final   Value:        BLOOD CULTURE RECEIVED NO GROWTH TO DATE CULTURE WILL BE HELD FOR 5 DAYS BEFORE ISSUING A FINAL NEGATIVE REPORT     Performed at [facility]   Report Status PENDING   Incomplete  CULTURE, BLOOD (ROUTINE X 2)     Status: None   Collection Time    11/03/13 11:07 PM      Result Value Ref Range Status   Specimen Description BLOOD LEFT ANTECUBITAL   Final   Special Requests BOTTLES DRAWN AEROBIC AND ANAEROBIC 6ML EACH   Final   Culture  Setup Time     Final   Value: 11/04/2013 03:55   Performed at [facility]   Culture     Final   Value:        BLOOD CULTURE RECEIVED NO GROWTH TO DATE CULTURE WILL BE HELD FOR 5 DAYS BEFORE ISSUING A FINAL NEGATIVE REPORT     Performed at [facility]   Report Status PENDING   Incomplete  CLOSTRIDIUM DIFFICILE BY PCR     Status: None   Collection Time    11/04/13  1:11 AM      Result Value Ref Range Status   C difficile by pcr NEGATIVE  NEGATIVE Final   Comment: Performed at [place] History: Past Medical History  Diagnosis Date  . Constipation due to pain medication   . Malignant neoplasm of base of tongue     left neck lymph and Left BOT cancer  . Current smoker   . 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 Base of Tongue and Vallecula    Medications:  Anti-infectives   Start     Dose/Rate Route Frequency Ordered Stop   11/05/13 0945  metroNIDAZOLE (FLAGYL) tablet 500 mg     500 mg Oral 3 times per day 11/05/13 0933     11/04/13 0600  ceFEPIme (MAXIPIME) 2 g in dextrose 5 % 50 mL IVPB     2 g 100 mL/hr over 30 Minutes Intravenous 3 times per day 11/04/13 0413     11/04/13 0000  ceFEPIme (MAXIPIME) 2 g in dextrose 5 % 50 mL IVPB     2 g 100 mL/hr over 30 Minutes Intravenous  Once 11/03/13 2348 11/04/13 0158   11/03/13 2330  vancomycin (VANCOCIN) IVPB 1000 mg/200 mL premix     1,000 mg 200 mL/hr over 60 Minutes Intravenous  Once 11/03/13 2322 11/04/13 0037     Assessment: 3 yoM with recently diagnosed metastatic squamous cell carcinoma of tongue with recent admission for fall and hypercalcemia of malignancy 6/29 - 7/6, during this hospitalization pt underwent induction chemo (7/1-7/6). Pt presented last night 7/8 with febrile neutropenia, hyponatremia, and diarrhea.   7/8 >> Vanc x 1 7/9 >> Cefepime >> 7/10 >> Flagyl PO (MD) >>  Tmax: Afebrile WBC: 3.8 (ANC 0.1-->0.5-> 1.9) - Neulasta 6mg  on 7/7 Renal: SCr stable, CrCl 61   7/8 blood: ngtd 7/8  CDiff: neg 7/8 U/A: neg  Goal of Therapy:  Cefepime dosed based on patient weight and renal function   Plan:  Continue Cefepime 2gm iv q8hr Follow up culture results. De-escalate therapy and switch to po antibiotics when able.  Kizzie Furnish, PharmD Pager: [phone] 11/07/2013 9:17 AM

## 2013-11-07 NOTE — Progress Notes (Signed)
11/07/2013 1710 Notified [facility] of dc home today. Jonnie Finner RN CCM Case Mgmt phone [phone]

## 2013-11-07 NOTE — Progress Notes (Signed)
Pt leaving at this time with his sister at his side. Pt alert, oriented, and without c/o. Discharge instructions/prescriptions given/explained with pt verbalizing understanding. Followup appointments noted.  Pt wearing glasses.

## 2013-11-08 ENCOUNTER — Encounter: Payer: Self-pay | Admitting: Hematology and Oncology

## 2013-11-08 ENCOUNTER — Telehealth: Payer: Self-pay | Admitting: Hematology and Oncology

## 2013-11-08 ENCOUNTER — Telehealth: Payer: Self-pay | Admitting: *Deleted

## 2013-11-08 ENCOUNTER — Ambulatory Visit (HOSPITAL_BASED_OUTPATIENT_CLINIC_OR_DEPARTMENT_OTHER): Payer: Medicaid Other

## 2013-11-08 DIAGNOSIS — Z452 Encounter for adjustment and management of vascular access device: Secondary | ICD-10-CM

## 2013-11-08 DIAGNOSIS — C01 Malignant neoplasm of base of tongue: Secondary | ICD-10-CM

## 2013-11-08 MED ORDER — SODIUM CHLORIDE 0.9 % IJ SOLN
10.0000 mL | INTRAMUSCULAR | Status: DC | PRN
  Administered 2013-11-08: 10 mL via INTRAVENOUS
  Filled 2013-11-08: qty 10

## 2013-11-08 MED ORDER — HEPARIN SOD (PORK) LOCK FLUSH 100 UNIT/ML IV SOLN
500.0000 [IU] | Freq: Once | INTRAVENOUS | Status: AC
  Administered 2013-11-08: 500 [IU] via INTRAVENOUS
  Filled 2013-11-08: qty 5

## 2013-11-08 NOTE — Telephone Encounter (Signed)
Friday at 915 am, 15 mins

## 2013-11-08 NOTE — Telephone Encounter (Signed)
lvm for pt regarding to July appt. °

## 2013-11-08 NOTE — Progress Notes (Signed)
Safety Net will not replace neulasta unless the patient is denied medicaid.

## 2013-11-08 NOTE — Telephone Encounter (Signed)
Wife left VM asking if MD appt w/ Dr. Alvy Bimler can be changed from Wednesday to Thursday or Friday this week?  She is off work on Thursday and Friday.

## 2013-11-08 NOTE — Telephone Encounter (Signed)
per 7/13 pof moved 7/15 appts to 7/17. lmonvm for pt re change w/new d/t.

## 2013-11-08 NOTE — Patient Instructions (Signed)
PICC Home Guide A peripherally inserted central catheter (PICC) is a long, thin, flexible tube that is inserted into a vein in the upper arm. It is a form of intravenous (IV) access. It is considered to be a "central" line because the tip of the PICC ends in a large vein in your chest. This large vein is called the superior vena cava (SVC). The PICC tip ends in the SVC because there is a lot of blood flow in the SVC. This allows medicines and IV fluids to be quickly distributed throughout the body. The PICC is inserted using a sterile technique by a specially trained nurse or physician. After the PICC is inserted, a chest X-ray exam is done to be sure it is in the correct place.  A PICC may be placed for different reasons, such as:  To give medicines and liquid nutrition that can only be given through a central line. Examples are:  Certain antibiotic treatments.  Chemotherapy.  Total parenteral nutrition (TPN).  To take frequent blood samples.  To give IV fluids and blood products.  If there is difficulty placing a peripheral intravenous (PIV) catheter. If taken care of properly, a PICC can remain in place for several months. A PICC can also allow a person to go home from the hospital early. Medicine and PICC care can be managed at home by a family member or home health care team. WHAT PROBLEMS CAN HAPPEN WHEN I HAVE A PICC? Problems with a PICC can occasionally occur. These may include:  A blood clot (thrombus) forming in or at the tip of the PICC. This can cause the PICC to become clogged. A clot-dissolving medicine called tissue plasminogen activator (tPA) can be given through the PICC to help break up the clot.  Inflammation of the vein (phlebitis) in which the PICC is placed. Signs of inflammation may include redness, pain at the insertion site, red streaks, or being able to feel a "cord" in the vein where the PICC is located.  Infection in the PICC or at the insertion site. Signs of  infection may include fever, chills, redness, swelling, or pus drainage from the PICC insertion site.  PICC movement (malposition). The PICC tip may move from its original position due to excessive physical activity, forceful coughing, sneezing, or vomiting.  A break or cut in the PICC. It is important to not use scissors near the PICC.  Nerve or tendon irritation or injury during PICC insertion. WHAT SHOULD I KEEP IN MIND ABOUT ACTIVITIES WHEN I HAVE A PICC?  You may bend your arm and move it freely. If your PICC is near or at the bend of your elbow, avoid activity with repeated motion at the elbow.  Rest at home for the remainder of the day following PICC line insertion.  Avoid lifting heavy objects as instructed by your health care provider.  Avoid using a crutch with the arm on the same side as your PICC. You may need to use a walker. WHAT SHOULD I KNOW ABOUT MY PICC DRESSING?  Keep your PICC bandage (dressing) clean and dry to prevent infection.  Ask your health care provider when you may shower. Ask your health care provider to teach you how to wrap the PICC when you do take a shower.  Change the PICC dressing as instructed by your health care provider.  Change your PICC dressing if it becomes loose or wet. WHAT SHOULD I KNOW ABOUT PICC CARE?  Check the PICC insertion site daily for   leakage, redness, swelling, or pain.  Do not take a bath, swim, or use hot tubs when you have a PICC. Cover PICC line with clear plastic wrap and tape to keep it dry while showering.  Flush the PICC as directed by your health care provider. Let your health care provider know right away if the PICC is difficult to flush or does not flush. Do not use force to flush the PICC.  Do not use a syringe that is less than 10 mL to flush the PICC.  Never pull or tug on the PICC.  Avoid blood pressure checks on the arm with the PICC.  Keep your PICC identification card with you at all times.  Do not  take the PICC out yourself. Only a trained clinical professional should remove the PICC. SEEK IMMEDIATE MEDICAL CARE IF:  Your PICC is accidently pulled all the way out. If this happens, cover the insertion site with a bandage or gauze dressing. Do not throw the PICC away. Your health care provider will need to inspect it.  Your PICC was tugged or pulled and has partially come out. Do not  push the PICC back in.  There is any type of drainage, redness, or swelling where the PICC enters the skin.  You cannot flush the PICC, it is difficult to flush, or the PICC leaks around the insertion site when it is flushed.  You hear a "flushing" sound when the PICC is flushed.  You have pain, discomfort, or numbness in your arm, shoulder, or jaw on the same side as the PICC.  You feel your heart "racing" or skipping beats.  You notice a hole or tear in the PICC.  You develop chills or a fever. MAKE SURE YOU:   Understand these instructions.  Will watch your condition.  Will get help right away if you are not doing well or get worse. Document Released: 10/20/2002 Document Revised: 02/03/2013 Document Reviewed: 12/21/2012 Exit[employer] Patient Information 2015 [employer]. This information is not intended to replace advice given to you by your health care provider. Make sure you discuss any questions you have with your health care provider.  

## 2013-11-09 ENCOUNTER — Other Ambulatory Visit: Payer: Self-pay | Admitting: *Deleted

## 2013-11-09 ENCOUNTER — Telehealth: Payer: Self-pay | Admitting: Hematology and Oncology

## 2013-11-09 NOTE — Telephone Encounter (Signed)
lvm for pt regarding to flush is sched for tomorrow

## 2013-11-10 ENCOUNTER — Other Ambulatory Visit: Payer: Self-pay

## 2013-11-10 ENCOUNTER — Ambulatory Visit: Payer: Self-pay | Admitting: Hematology and Oncology

## 2013-11-10 ENCOUNTER — Ambulatory Visit (HOSPITAL_BASED_OUTPATIENT_CLINIC_OR_DEPARTMENT_OTHER): Payer: Medicaid Other

## 2013-11-10 VITALS — BP 115/65 | HR 97 | Temp 98.3°F

## 2013-11-10 DIAGNOSIS — Z452 Encounter for adjustment and management of vascular access device: Secondary | ICD-10-CM

## 2013-11-10 DIAGNOSIS — C01 Malignant neoplasm of base of tongue: Secondary | ICD-10-CM

## 2013-11-10 LAB — CULTURE, BLOOD (ROUTINE X 2)
CULTURE: NO GROWTH
Culture: NO GROWTH

## 2013-11-10 MED ORDER — SODIUM CHLORIDE 0.9 % IJ SOLN
10.0000 mL | INTRAMUSCULAR | Status: DC | PRN
  Administered 2013-11-10: 10 mL via INTRAVENOUS
  Filled 2013-11-10: qty 10

## 2013-11-10 MED ORDER — HEPARIN SOD (PORK) LOCK FLUSH 100 UNIT/ML IV SOLN
500.0000 [IU] | Freq: Once | INTRAVENOUS | Status: AC
  Administered 2013-11-10: 250 [IU] via INTRAVENOUS
  Filled 2013-11-10: qty 5

## 2013-11-10 NOTE — Patient Instructions (Signed)
PICC Home Guide A peripherally inserted central catheter (PICC) is a long, thin, flexible tube that is inserted into a vein in the upper arm. It is a form of intravenous (IV) access. It is considered to be a "central" line because the tip of the PICC ends in a large vein in your chest. This large vein is called the superior vena cava (SVC). The PICC tip ends in the SVC because there is a lot of blood flow in the SVC. This allows medicines and IV fluids to be quickly distributed throughout the body. The PICC is inserted using a sterile technique by a specially trained nurse or physician. After the PICC is inserted, a chest X-ray exam is done to be sure it is in the correct place.  A PICC may be placed for different reasons, such as:  To give medicines and liquid nutrition that can only be given through a central line. Examples are:  Certain antibiotic treatments.  Chemotherapy.  Total parenteral nutrition (TPN).  To take frequent blood samples.  To give IV fluids and blood products.  If there is difficulty placing a peripheral intravenous (PIV) catheter. If taken care of properly, a PICC can remain in place for several months. A PICC can also allow a person to go home from the hospital early. Medicine and PICC care can be managed at home by a family member or home health care team. WHAT PROBLEMS CAN HAPPEN WHEN I HAVE A PICC? Problems with a PICC can occasionally occur. These may include:  A blood clot (thrombus) forming in or at the tip of the PICC. This can cause the PICC to become clogged. A clot-dissolving medicine called tissue plasminogen activator (tPA) can be given through the PICC to help break up the clot.  Inflammation of the vein (phlebitis) in which the PICC is placed. Signs of inflammation may include redness, pain at the insertion site, red streaks, or being able to feel a "cord" in the vein where the PICC is located.  Infection in the PICC or at the insertion site. Signs of  infection may include fever, chills, redness, swelling, or pus drainage from the PICC insertion site.  PICC movement (malposition). The PICC tip may move from its original position due to excessive physical activity, forceful coughing, sneezing, or vomiting.  A break or cut in the PICC. It is important to not use scissors near the PICC.  Nerve or tendon irritation or injury during PICC insertion. WHAT SHOULD I KEEP IN MIND ABOUT ACTIVITIES WHEN I HAVE A PICC?  You may bend your arm and move it freely. If your PICC is near or at the bend of your elbow, avoid activity with repeated motion at the elbow.  Rest at home for the remainder of the day following PICC line insertion.  Avoid lifting heavy objects as instructed by your health care provider.  Avoid using a crutch with the arm on the same side as your PICC. You may need to use a walker. WHAT SHOULD I KNOW ABOUT MY PICC DRESSING?  Keep your PICC bandage (dressing) clean and dry to prevent infection.  Ask your health care provider when you may shower. Ask your health care provider to teach you how to wrap the PICC when you do take a shower.  Change the PICC dressing as instructed by your health care provider.  Change your PICC dressing if it becomes loose or wet. WHAT SHOULD I KNOW ABOUT PICC CARE?  Check the PICC insertion site daily for   leakage, redness, swelling, or pain.  Do not take a bath, swim, or use hot tubs when you have a PICC. Cover PICC line with clear plastic wrap and tape to keep it dry while showering.  Flush the PICC as directed by your health care provider. Let your health care provider know right away if the PICC is difficult to flush or does not flush. Do not use force to flush the PICC.  Do not use a syringe that is less than 10 mL to flush the PICC.  Never pull or tug on the PICC.  Avoid blood pressure checks on the arm with the PICC.  Keep your PICC identification card with you at all times.  Do not  take the PICC out yourself. Only a trained clinical professional should remove the PICC. SEEK IMMEDIATE MEDICAL CARE IF:  Your PICC is accidently pulled all the way out. If this happens, cover the insertion site with a bandage or gauze dressing. Do not throw the PICC away. Your health care provider will need to inspect it.  Your PICC was tugged or pulled and has partially come out. Do not  push the PICC back in.  There is any type of drainage, redness, or swelling where the PICC enters the skin.  You cannot flush the PICC, it is difficult to flush, or the PICC leaks around the insertion site when it is flushed.  You hear a "flushing" sound when the PICC is flushed.  You have pain, discomfort, or numbness in your arm, shoulder, or jaw on the same side as the PICC.  You feel your heart "racing" or skipping beats.  You notice a hole or tear in the PICC.  You develop chills or a fever. MAKE SURE YOU:   Understand these instructions.  Will watch your condition.  Will get help right away if you are not doing well or get worse. Document Released: 10/20/2002 Document Revised: 02/03/2013 Document Reviewed: 12/21/2012 Exit[employer] Patient Information 2015 [employer]. This information is not intended to replace advice given to you by your health care provider. Make sure you discuss any questions you have with your health care provider.  

## 2013-11-11 ENCOUNTER — Ambulatory Visit: Payer: Self-pay | Admitting: Hematology and Oncology

## 2013-11-11 ENCOUNTER — Other Ambulatory Visit: Payer: Self-pay | Admitting: Hematology and Oncology

## 2013-11-11 DIAGNOSIS — C01 Malignant neoplasm of base of tongue: Secondary | ICD-10-CM

## 2013-11-12 ENCOUNTER — Ambulatory Visit: Payer: Medicaid Other

## 2013-11-12 ENCOUNTER — Ambulatory Visit (HOSPITAL_BASED_OUTPATIENT_CLINIC_OR_DEPARTMENT_OTHER): Payer: Medicaid Other | Admitting: Hematology and Oncology

## 2013-11-12 ENCOUNTER — Other Ambulatory Visit (HOSPITAL_BASED_OUTPATIENT_CLINIC_OR_DEPARTMENT_OTHER): Payer: Medicaid Other

## 2013-11-12 ENCOUNTER — Encounter: Payer: Self-pay | Admitting: Hematology and Oncology

## 2013-11-12 ENCOUNTER — Encounter: Payer: Self-pay | Admitting: *Deleted

## 2013-11-12 ENCOUNTER — Telehealth: Payer: Self-pay | Admitting: Hematology and Oncology

## 2013-11-12 VITALS — BP 120/73 | HR 108 | Temp 98.4°F | Resp 18 | Ht 65.0 in | Wt 111.5 lb

## 2013-11-12 DIAGNOSIS — M542 Cervicalgia: Secondary | ICD-10-CM

## 2013-11-12 DIAGNOSIS — C01 Malignant neoplasm of base of tongue: Secondary | ICD-10-CM

## 2013-11-12 DIAGNOSIS — D75839 Thrombocytosis, unspecified: Secondary | ICD-10-CM

## 2013-11-12 DIAGNOSIS — G4700 Insomnia, unspecified: Secondary | ICD-10-CM | POA: Insufficient documentation

## 2013-11-12 DIAGNOSIS — D72829 Elevated white blood cell count, unspecified: Secondary | ICD-10-CM

## 2013-11-12 DIAGNOSIS — Z95828 Presence of other vascular implants and grafts: Secondary | ICD-10-CM

## 2013-11-12 DIAGNOSIS — D473 Essential (hemorrhagic) thrombocythemia: Secondary | ICD-10-CM

## 2013-11-12 DIAGNOSIS — R634 Abnormal weight loss: Secondary | ICD-10-CM

## 2013-11-12 DIAGNOSIS — D630 Anemia in neoplastic disease: Secondary | ICD-10-CM

## 2013-11-12 DIAGNOSIS — T148XXA Other injury of unspecified body region, initial encounter: Secondary | ICD-10-CM

## 2013-11-12 HISTORY — DX: Insomnia, unspecified: G47.00

## 2013-11-12 HISTORY — DX: Hypomagnesemia: E83.42

## 2013-11-12 LAB — CBC WITH DIFFERENTIAL/PLATELET
BASO%: 0.7 % (ref 0.0–2.0)
BASOS ABS: 0.4 10*3/uL — AB (ref 0.0–0.1)
EOS ABS: 0.4 10*3/uL (ref 0.0–0.5)
EOS%: 0.7 % (ref 0.0–7.0)
HEMATOCRIT: 27.7 % — AB (ref 38.4–49.9)
HEMOGLOBIN: 8.8 g/dL — AB (ref 13.0–17.1)
LYMPH#: 3.8 10*3/uL — AB (ref 0.9–3.3)
LYMPH%: 6.2 % — ABNORMAL LOW (ref 14.0–49.0)
MCH: 31.9 pg (ref 27.2–33.4)
MCHC: 31.9 g/dL — ABNORMAL LOW (ref 32.0–36.0)
MCV: 100 fL — ABNORMAL HIGH (ref 79.3–98.0)
MONO#: 0.7 10*3/uL (ref 0.1–0.9)
MONO%: 1.2 % (ref 0.0–14.0)
NEUT%: 91.2 % — AB (ref 39.0–75.0)
NEUTROS ABS: 55.5 10*3/uL — AB (ref 1.5–6.5)
Platelets: 402 10*3/uL — ABNORMAL HIGH (ref 140–400)
RBC: 2.77 10*6/uL — ABNORMAL LOW (ref 4.20–5.82)
RDW: 15.7 % — AB (ref 11.0–14.6)
WBC: 60.9 10*3/uL — AB (ref 4.0–10.3)
nRBC: 1 % — ABNORMAL HIGH (ref 0–0)

## 2013-11-12 LAB — COMPREHENSIVE METABOLIC PANEL (CC13)
ALBUMIN: 2.2 g/dL — AB (ref 3.5–5.0)
ALT: 12 U/L (ref 0–55)
AST: 14 U/L (ref 5–34)
Alkaline Phosphatase: 102 U/L (ref 40–150)
Anion Gap: 9 mEq/L (ref 3–11)
BUN: 6 mg/dL — AB (ref 7.0–26.0)
CALCIUM: 7.5 mg/dL — AB (ref 8.4–10.4)
CHLORIDE: 106 meq/L (ref 98–109)
CO2: 24 mEq/L (ref 22–29)
Creatinine: 0.6 mg/dL — ABNORMAL LOW (ref 0.7–1.3)
GLUCOSE: 147 mg/dL — AB (ref 70–140)
Potassium: 4.1 mEq/L (ref 3.5–5.1)
Sodium: 138 mEq/L (ref 136–145)
Total Bilirubin: <0.2 mg/dL (ref 0.20–1.20)
Total Protein: 4.3 g/dL — ABNORMAL LOW (ref 6.4–8.3)

## 2013-11-12 LAB — HOLD TUBE, BLOOD BANK

## 2013-11-12 LAB — MAGNESIUM (CC13): MAGNESIUM: 1.4 mg/dL — AB (ref 1.5–2.5)

## 2013-11-12 MED ORDER — HEPARIN SOD (PORK) LOCK FLUSH 100 UNIT/ML IV SOLN
500.0000 [IU] | Freq: Once | INTRAVENOUS | Status: AC
  Administered 2013-11-12: 250 [IU] via INTRAVENOUS
  Filled 2013-11-12: qty 5

## 2013-11-12 MED ORDER — MAGNESIUM OXIDE 400 (241.3 MG) MG PO TABS: 400.0000 mg | ORAL_TABLET | Freq: Every day | ORAL | Status: DC

## 2013-11-12 MED ORDER — SODIUM CHLORIDE 0.9 % IJ SOLN
10.0000 mL | INTRAMUSCULAR | Status: DC | PRN
  Administered 2013-11-12: 10 mL via INTRAVENOUS
  Filled 2013-11-12: qty 10

## 2013-11-12 MED ORDER — TEMAZEPAM 15 MG PO CAPS: 15.0000 mg | ORAL_CAPSULE | Freq: Every evening | ORAL | Status: DC | PRN

## 2013-11-12 NOTE — Telephone Encounter (Signed)
Pt confirmed labs/flush/ov per 07/16 POF, gave pt AVS........KJ

## 2013-11-13 DIAGNOSIS — T148XXA Other injury of unspecified body region, initial encounter: Secondary | ICD-10-CM | POA: Insufficient documentation

## 2013-11-13 NOTE — Assessment & Plan Note (Signed)
I gave him prescription temazepam to use and warning about side effects.

## 2013-11-13 NOTE — Assessment & Plan Note (Signed)
The size of the tumor on his left neck has reduced in size. Unfortunately, he tolerated treatment very poorly with recurrent admissions to the hospital with significant side effects. His cycle 2 of chemotherapy need to be adjusted and he needs to be seen regularly to prevent another admission if possible. I will see him early next week before his cycle 2 of treatment.

## 2013-11-13 NOTE — Progress Notes (Signed)
[name] OFFICE PROGRESS NOTE  Patient Care Team: [name], MD as PCP - General (Hematology and Oncology) Brooks Sailors, RN as Oncology Nurse Navigator  SUMMARY OF ONCOLOGIC HISTORY: Oncology History   Cancer of base of tongue   Primary site: Pharynx - Oropharynx   Staging method: AJCC 7th Edition   Clinical: Stage IVB (T2, N3, M0) signed by [name], MD on 10/15/2013 10:25 AM   Summary: Stage IVB (T2, N3, M0)       Cancer of base of tongue   09/17/2013 Imaging CT scan of the neck showed large bilateral neck lymphadenopathy with associated tongue mass.   09/23/2013 Procedure Fine needle aspirate of the left neck mass came back positive for squamous cell carcinoma.   10/04/2013 Surgery Laryngoscopy showed a firm mass within the left tongue base extending past the midline and encompassing much of the tongue base.  There is extension of the mass into the vallecula and on to the lingual surface of the epiglottis.   10/04/2013 Pathology Vallecula and tongue biopsy confirmed squamous cell carcinoma.   10/15/2013 Imaging PET/CT scan showed no evidence of distant metastatic disease apart from just regional lymph node involvement.   10/25/2013 - 11/01/2013 Hospital Admission    10/27/2013 -  Chemotherapy He is started on induction chemo with cisplatin, taxotere, 5 FU   11/03/2013 - 11/07/2013 Hospital Admission     INTERVAL HISTORY: Please see below for problem oriented charting. He is seen today prior to cycle 2 of treatment and post-discharge hospital followup. Overall, he is improving slowly. He is eating regularly and has gained a bit of weight. He has 3 loose bowel movement but nothing foul-smelling. He continues to have regular drainage from the neck wound. His pain appears to be well controlled with current pain regimen. He complained of insomnia.  REVIEW OF SYSTEMS:   Constitutional: Denies fevers, chills or abnormal weight loss Eyes: Denies blurriness of  vision Ears, nose, mouth, throat, and face: Denies mucositis or sore throat Respiratory: Denies cough, dyspnea or wheezes Cardiovascular: Denies palpitation, chest discomfort or lower extremity swelling Neurological:Denies numbness, tingling or new weaknesses Behavioral/Psych: Mood is stable, no new changes  All other systems were reviewed with the patient and are negative.  I have reviewed the past medical history, past surgical history, social history and family history with the patient and they are unchanged from previous note.  ALLERGIES:  has No Known Allergies.  MEDICATIONS:  Current Outpatient Prescriptions  Medication Sig Dispense Refill  . acetaminophen (TYLENOL) 325 MG tablet Take 650 mg by mouth every 6 (six) hours as needed for mild pain or moderate pain.      Marland Kitchen albuterol (PROVENTIL) (2.5 MG/3ML) 0.083% nebulizer solution Take 3 mLs (2.5 mg total) by nebulization every 6 (six) hours as needed for wheezing or shortness of breath.  75 mL  12  . antiseptic oral rinse (BIOTENE) LIQD 15 mLs by Mouth Rinse route 2 (two) times daily.  1 Bottle  2  . chlorhexidine (PERIDEX) 0.12 % solution Rinse with 15 mls twice daily for 30 seconds. Use after breakfast and at bedtime. Spit out excess. Do not swallow.  480 mL  prn  . dexamethasone (DECADRON) 4 MG tablet Start the day before Taxotere. Take once the day after, then 2 times a day x 2 days. Repeat in 3 weeks  30 tablet  1  . feeding supplement, ENSURE COMPLETE, (ENSURE COMPLETE) LIQD Take 237 mLs by mouth 2 (two) times daily between  meals.      . lactulose (CHRONULAC) 10 GM/15ML solution Take 15 mLs (10 g total) by mouth 2 (two) times daily as needed for mild constipation.  240 mL  2  . levofloxacin (LEVAQUIN) 500 MG tablet Take 1 tablet (500 mg total) by mouth daily.  7 tablet  0  . metroNIDAZOLE (FLAGYL) 500 MG tablet Take 1 tablet (500 mg total) by mouth every 8 (eight) hours.  21 tablet  0  . Morphine Sulfate (MORPHINE CONCENTRATE) 10 mg  / 0.5 ml concentrated solution Take 0.25 mLs (5 mg total) by mouth every 2 (two) hours as needed for severe pain.  60 mL  0  . ondansetron (ZOFRAN) 8 MG tablet Take 1 tablet (8 mg total) by mouth every 8 (eight) hours as needed for nausea or vomiting (Nausea or vomiting).  30 tablet  1  . pantoprazole (PROTONIX) 40 MG tablet Take 1 tablet (40 mg total) by mouth daily.  30 tablet  2  . polyethylene glycol (MIRALAX / GLYCOLAX) packet Take 17 g by mouth daily.  30 each  2  . prochlorperazine (COMPAZINE) 10 MG tablet Take 1 tablet (10 mg total) by mouth every 6 (six) hours as needed (Nausea or vomiting).  30 tablet  1  . senna-docusate (SENOKOT-S) 8.6-50 MG per tablet Take 2 tablets by mouth 2 (two) times daily.  120 tablet  0  . magnesium oxide (MAG-OX) 400 (241.3 MG) MG tablet Take 1 tablet (400 mg total) by mouth daily.  60 tablet  1  . temazepam (RESTORIL) 15 MG capsule Take 1 capsule (15 mg total) by mouth at bedtime as needed for sleep.  30 capsule  0   No current facility-administered medications for this visit.    PHYSICAL EXAMINATION: ECOG PERFORMANCE STATUS: 1 - Symptomatic but completely ambulatory  Filed Vitals:   11/12/13 0905  BP: 120/73  Pulse: 108  Temp: 98.4 F (36.9 C)  Resp: 18   Filed Weights   11/12/13 0905  Weight: 111 lb 8 oz (50.576 kg)    GENERAL:alert, no distress and comfortable. He looks thin and cachectic SKIN: Mild erythema around his neck when but does not look cellulitic. EYES: normal, Conjunctiva are pink and non-injected, sclera clear OROPHARYNX:no exudate, no erythema and lips, buccal mucosa, and tongue normal . No thrush NECK: supple, thyroid normal size, non-tender, without nodularity LYMPH: Large necrotic mass with wound drainage from the left side of the neck, smaller in size  LUNGS: clear to auscultation and percussion with normal breathing effort HEART: regular rate & rhythm and no murmurs and no lower extremity edema ABDOMEN:abdomen soft,  non-tender and normal bowel sounds Musculoskeletal:no cyanosis of digits and no clubbing  NEURO: alert & oriented x 3 with fluent speech, no focal motor/sensory deficits  LABORATORY DATA:  I have reviewed the data as listed    Component Value Date/Time   NA 138 11/12/2013 0836   NA 133* 11/06/2013 0445   K 4.1 11/12/2013 0836   K 3.5* 11/06/2013 0445   CL 98 11/06/2013 0445   CO2 24 11/12/2013 0836   CO2 21 11/06/2013 0445   GLUCOSE 147* 11/12/2013 0836   GLUCOSE 99 11/06/2013 0445   BUN 6.0* 11/12/2013 0836   BUN 11 11/06/2013 0445   CREATININE 0.6* 11/12/2013 0836   CREATININE 0.63 11/06/2013 0445   CALCIUM 7.5* 11/12/2013 0836   CALCIUM 7.0* 11/06/2013 0445   PROT 4.3* 11/12/2013 0836   PROT 5.6* 11/03/2013 2220   ALBUMIN 2.2* 11/12/2013  0836   ALBUMIN 2.5* 11/03/2013 2220   AST 14 11/12/2013 0836   AST 7 11/03/2013 2220   ALT 12 11/12/2013 0836   ALT 9 11/03/2013 2220   ALKPHOS 102 11/12/2013 0836   ALKPHOS 53 11/03/2013 2220   BILITOT <0.20 11/12/2013 0836   BILITOT 0.6 11/03/2013 2220   GFRNONAA >90 11/06/2013 0445   GFRAA >90 11/06/2013 0445    No results found for this basename: SPEP, UPEP,  kappa and lambda light chains    Lab Results  Component Value Date   WBC 60.9* 11/12/2013   NEUTROABS 55.5* 11/12/2013   HGB 8.8* 11/12/2013   HCT 27.7* 11/12/2013   MCV 100.0* 11/12/2013   PLT 402* 11/12/2013      Chemistry      Component Value Date/Time   NA 138 11/12/2013 0836   NA 133* 11/06/2013 0445   K 4.1 11/12/2013 0836   K 3.5* 11/06/2013 0445   CL 98 11/06/2013 0445   CO2 24 11/12/2013 0836   CO2 21 11/06/2013 0445   BUN 6.0* 11/12/2013 0836   BUN 11 11/06/2013 0445   CREATININE 0.6* 11/12/2013 0836   CREATININE 0.63 11/06/2013 0445      Component Value Date/Time   CALCIUM 7.5* 11/12/2013 0836   CALCIUM 7.0* 11/06/2013 0445   ALKPHOS 102 11/12/2013 0836   ALKPHOS 53 11/03/2013 2220   AST 14 11/12/2013 0836   AST 7 11/03/2013 2220   ALT 12 11/12/2013 0836   ALT 9 11/03/2013 2220   BILITOT <0.20  11/12/2013 0836   BILITOT 0.6 11/03/2013 2220     ASSESSMENT & PLAN:  Cancer of base of tongue The size of the tumor on his left neck has reduced in size. Unfortunately, he tolerated treatment very poorly with recurrent admissions to the hospital with significant side effects. His cycle 2 of chemotherapy need to be adjusted and he needs to be seen regularly to prevent another admission if possible. I will see him early next week before his cycle 2 of treatment.  Weight loss This is due to recurrent admission and difficulties with eating. I continue to encourage him to increase oral intake and additional nutritional supplements.  Thrombocytosis This is reactive in nature due to necrotic tumor on his neck. I will observe only.  Neck pain I refill his pain medication and we'll continue to adjust his pain medicines as needed.  Leukocytosis, unspecified This is likely related to the recent Neulasta injection and necrotic tumor on his neck. He is still on antibiotic therapy and will complete his course of treatment.  Insomnia I gave him prescription temazepam to use and warning about side effects.  Hypomagnesemia This is due to side effects of cisplatin. I given prescription magnesium for placement.  Anemia in neoplastic disease Recent workup such as anemia chronic disease. He is not symptomatic. Recommend observation only.    Wound discharge He has a large necrotic one on his neck measured at least 3 cm across. Continue regular wound dressing changes and close monitoring for potential cellulitis on surrounding tissue   Malignant hypercalcemia has resolved. All questions were answered. The patient knows to call the clinic with any problems, questions or concerns. No barriers to learning was detected. I spent 40 minutes counseling the patient face to face. The total time spent in the appointment was 55 minutes and more than 50% was on counseling and review of test results      [facility], Marjan Rosman, MD 11/13/2013 8:14 PM

## 2013-11-13 NOTE — Assessment & Plan Note (Signed)
This is due to side effects of cisplatin. I given prescription magnesium for placement.

## 2013-11-13 NOTE — Assessment & Plan Note (Signed)
This is likely related to the recent Neulasta injection and necrotic tumor on his neck. He is still on antibiotic therapy and will complete his course of treatment.

## 2013-11-13 NOTE — Assessment & Plan Note (Signed)
I refill his pain medication and we'll continue to adjust his pain medicines as needed.

## 2013-11-13 NOTE — Assessment & Plan Note (Signed)
He has a large necrotic one on his neck measured at least 3 cm across. Continue regular wound dressing changes and close monitoring for potential cellulitis on surrounding tissue

## 2013-11-13 NOTE — Assessment & Plan Note (Signed)
This is reactive in nature due to necrotic tumor on his neck. I will observe only.

## 2013-11-13 NOTE — Progress Notes (Signed)
To provide support and encouragement to patient and his wife, met with them in [place] [address].  Both Chuck and Ayr verbalized frustration and discouragement with another admission after just being discharged but understanding of the need.  Continuing navigation as L2 patient (treatments established).  Gayleen Orem, RN, BSN, [facility] Head & Neck Oncology Navigator [phone]

## 2013-11-13 NOTE — Assessment & Plan Note (Signed)
Recent workup such as anemia chronic disease. He is not symptomatic. Recommend observation only.

## 2013-11-13 NOTE — Progress Notes (Signed)
To provide support, encouragement and care continuity, met with [name] and his wife [name] during est pt appt with Dr. Alvy Bimler.  He stated he has been feeling much better since being DC'd from hospital this past Sunday, in particular, "eating everything".  Subjectively, he presented upbeat and encouraged by improved status.  In response to Betsy's hinted need of support, I encouraged her to avail herself to chaplain/SW services and Caregiver Support Group opportunities provided by [facility], I provided her a copy of [facility] July schedule.  She reluctantly acknowledged.  I will make referral to Ubaldo Glassing (Chaplain) and Lauren (LCSW) for follow-up.  Initiating navigation as L2 patient (treatments established) with this encounter.  Gayleen Orem, RN, BSN, [facility] Head & Neck Oncology Navigator [phone]

## 2013-11-13 NOTE — Assessment & Plan Note (Addendum)
This is due to recurrent admission and difficulties with eating. I continue to encourage him to increase oral intake and additional nutritional supplements.

## 2013-11-15 ENCOUNTER — Telehealth: Payer: Self-pay | Admitting: Hematology and Oncology

## 2013-11-15 ENCOUNTER — Telehealth: Payer: Self-pay | Admitting: *Deleted

## 2013-11-15 ENCOUNTER — Encounter: Payer: Self-pay | Admitting: Hematology and Oncology

## 2013-11-15 ENCOUNTER — Ambulatory Visit (HOSPITAL_BASED_OUTPATIENT_CLINIC_OR_DEPARTMENT_OTHER): Payer: Medicaid Other | Admitting: Hematology and Oncology

## 2013-11-15 ENCOUNTER — Other Ambulatory Visit: Payer: Self-pay | Admitting: Hematology and Oncology

## 2013-11-15 ENCOUNTER — Other Ambulatory Visit (HOSPITAL_BASED_OUTPATIENT_CLINIC_OR_DEPARTMENT_OTHER): Payer: Medicaid Other

## 2013-11-15 ENCOUNTER — Ambulatory Visit (HOSPITAL_BASED_OUTPATIENT_CLINIC_OR_DEPARTMENT_OTHER): Payer: Medicaid Other

## 2013-11-15 VITALS — BP 125/71 | HR 88 | Temp 98.2°F | Resp 18 | Ht 65.0 in | Wt 114.9 lb

## 2013-11-15 DIAGNOSIS — R197 Diarrhea, unspecified: Secondary | ICD-10-CM | POA: Insufficient documentation

## 2013-11-15 DIAGNOSIS — M542 Cervicalgia: Secondary | ICD-10-CM

## 2013-11-15 DIAGNOSIS — R634 Abnormal weight loss: Secondary | ICD-10-CM

## 2013-11-15 DIAGNOSIS — Z95828 Presence of other vascular implants and grafts: Secondary | ICD-10-CM

## 2013-11-15 DIAGNOSIS — C01 Malignant neoplasm of base of tongue: Secondary | ICD-10-CM

## 2013-11-15 DIAGNOSIS — D75839 Thrombocytosis, unspecified: Secondary | ICD-10-CM

## 2013-11-15 DIAGNOSIS — D630 Anemia in neoplastic disease: Secondary | ICD-10-CM

## 2013-11-15 DIAGNOSIS — T148XXA Other injury of unspecified body region, initial encounter: Secondary | ICD-10-CM

## 2013-11-15 DIAGNOSIS — R609 Edema, unspecified: Secondary | ICD-10-CM

## 2013-11-15 DIAGNOSIS — D72829 Elevated white blood cell count, unspecified: Secondary | ICD-10-CM

## 2013-11-15 DIAGNOSIS — D473 Essential (hemorrhagic) thrombocythemia: Secondary | ICD-10-CM

## 2013-11-15 DIAGNOSIS — R600 Localized edema: Secondary | ICD-10-CM

## 2013-11-15 LAB — COMPREHENSIVE METABOLIC PANEL (CC13)
ALK PHOS: 82 U/L (ref 40–150)
ALT: 12 U/L (ref 0–55)
AST: 14 U/L (ref 5–34)
Albumin: 2.4 g/dL — ABNORMAL LOW (ref 3.5–5.0)
Anion Gap: 5 mEq/L (ref 3–11)
BUN: 8.4 mg/dL (ref 7.0–26.0)
CO2: 26 mEq/L (ref 22–29)
Calcium: 7.2 mg/dL — ABNORMAL LOW (ref 8.4–10.4)
Chloride: 105 mEq/L (ref 98–109)
Creatinine: 0.6 mg/dL — ABNORMAL LOW (ref 0.7–1.3)
GLUCOSE: 96 mg/dL (ref 70–140)
Potassium: 4.6 mEq/L (ref 3.5–5.1)
Sodium: 137 mEq/L (ref 136–145)
Total Protein: 4.7 g/dL — ABNORMAL LOW (ref 6.4–8.3)

## 2013-11-15 LAB — CBC WITH DIFFERENTIAL/PLATELET
BASO%: 1.5 % (ref 0.0–2.0)
BASOS ABS: 0.6 10*3/uL — AB (ref 0.0–0.1)
EOS%: 0.4 % (ref 0.0–7.0)
Eosinophils Absolute: 0.1 10*3/uL (ref 0.0–0.5)
HEMATOCRIT: 26.9 % — AB (ref 38.4–49.9)
HGB: 8.7 g/dL — ABNORMAL LOW (ref 13.0–17.1)
LYMPH%: 9 % — ABNORMAL LOW (ref 14.0–49.0)
MCH: 32.8 pg (ref 27.2–33.4)
MCHC: 32.3 g/dL (ref 32.0–36.0)
MCV: 101.5 fL — AB (ref 79.3–98.0)
MONO#: 1.4 10*3/uL — ABNORMAL HIGH (ref 0.1–0.9)
MONO%: 3.8 % (ref 0.0–14.0)
NEUT#: 31.5 10*3/uL — ABNORMAL HIGH (ref 1.5–6.5)
NEUT%: 85.3 % — AB (ref 39.0–75.0)
Platelets: 514 10*3/uL — ABNORMAL HIGH (ref 140–400)
RBC: 2.65 10*6/uL — ABNORMAL LOW (ref 4.20–5.82)
RDW: 19.1 % — AB (ref 11.0–14.6)
WBC: 37 10*3/uL — ABNORMAL HIGH (ref 4.0–10.3)
lymph#: 3.3 10*3/uL (ref 0.9–3.3)
nRBC: 1 % — ABNORMAL HIGH (ref 0–0)

## 2013-11-15 LAB — HOLD TUBE, BLOOD BANK

## 2013-11-15 LAB — MAGNESIUM (CC13): MAGNESIUM: 1.6 mg/dL (ref 1.5–2.5)

## 2013-11-15 MED ORDER — HEPARIN SOD (PORK) LOCK FLUSH 100 UNIT/ML IV SOLN
500.0000 [IU] | Freq: Once | INTRAVENOUS | Status: AC
  Administered 2013-11-15: 500 [IU] via INTRAVENOUS
  Filled 2013-11-15: qty 5

## 2013-11-15 MED ORDER — SODIUM CHLORIDE 0.9 % IJ SOLN
10.0000 mL | INTRAMUSCULAR | Status: DC | PRN
  Administered 2013-11-15: 10 mL via INTRAVENOUS
  Filled 2013-11-15: qty 10

## 2013-11-15 MED ORDER — DEXAMETHASONE 4 MG PO TABS: ORAL_TABLET | ORAL | Status: DC

## 2013-11-15 NOTE — Assessment & Plan Note (Signed)
This is reactive in nature due to necrotic tumor on his neck. I will observe only.

## 2013-11-15 NOTE — Assessment & Plan Note (Signed)
This is due to side effects of cisplatin. I have given prescription magnesium for placement.

## 2013-11-15 NOTE — Assessment & Plan Note (Signed)
This is likely related to the recent Neulasta injection and necrotic tumor on his neck. He is still on antibiotic therapy and will complete his course of treatment.

## 2013-11-15 NOTE — Assessment & Plan Note (Signed)
Recent workup such as anemia chronic disease. He is not symptomatic. Recommend observation only. I would transfuse if hemoglobin dropped to less than 8 g.

## 2013-11-15 NOTE — Telephone Encounter (Signed)
Received message from [facility] stating patient refused home care services due to self pay. They attempted to call him regarding assistance, but pt never returned calls

## 2013-11-15 NOTE — Assessment & Plan Note (Signed)
His pain is under good control and we will continue to adjust his pain medicines as needed.

## 2013-11-15 NOTE — Assessment & Plan Note (Signed)
This is due to recurrent admission and difficulties with eating. I continue to encourage him to increase oral intake and additional nutritional supplements.

## 2013-11-15 NOTE — Telephone Encounter (Signed)
gv and printed appts ched and avs for pt for July and Aug...sed added tx. °

## 2013-11-15 NOTE — Progress Notes (Signed)
[name] OFFICE PROGRESS NOTE  Patient Care Team: [name], MD as PCP - General (Hematology and Oncology) Brooks Sailors, RN as Oncology Nurse Navigator  SUMMARY OF ONCOLOGIC HISTORY: Oncology History   Cancer of base of tongue   Primary site: Pharynx - Oropharynx   Staging method: AJCC 7th Edition   Clinical: Stage IVB (T2, N3, M0) signed by [name], MD on 10/15/2013 10:25 AM   Summary: Stage IVB (T2, N3, M0)       Cancer of base of tongue   09/17/2013 Imaging CT scan of the neck showed large bilateral neck lymphadenopathy with associated tongue mass.   09/23/2013 Procedure Fine needle aspirate of the left neck mass came back positive for squamous cell carcinoma.   10/04/2013 Surgery Laryngoscopy showed a firm mass within the left tongue base extending past the midline and encompassing much of the tongue base.  There is extension of the mass into the vallecula and on to the lingual surface of the epiglottis.   10/04/2013 Pathology Vallecula and tongue biopsy confirmed squamous cell carcinoma.   10/15/2013 Imaging PET/CT scan showed no evidence of distant metastatic disease apart from just regional lymph node involvement.   10/25/2013 - 11/01/2013 Hospital Admission    10/27/2013 -  Chemotherapy He is started on induction chemo with cisplatin, taxotere, 5 FU   11/03/2013 - 11/07/2013 Hospital Admission     INTERVAL HISTORY: Please see below for problem oriented charting. He is feeling well. He has gained some weight. Diarrhea has resolved. The necrotic neck wound mass is getting smaller. The patient denies any mouth sores, nausea, vomiting or change in bowel habits He denies any recent fever, chills, night sweats or abnormal weight loss He complained of bilateral leg edema.  REVIEW OF SYSTEMS:   Constitutional: Denies fevers, chills or abnormal weight loss Eyes: Denies blurriness of vision Ears, nose, mouth, throat, and face: Denies mucositis or sore  throat Respiratory: Denies cough, dyspnea or wheezes Cardiovascular: Denies palpitation, chest discomfort  Skin: Denies abnormal skin rashes Lymphatics: Denies new lymphadenopathy or easy bruising Neurological:Denies numbness, tingling or new weaknesses Behavioral/Psych: Mood is stable, no new changes  All other systems were reviewed with the patient and are negative.  I have reviewed the past medical history, past surgical history, social history and family history with the patient and they are unchanged from previous note.  ALLERGIES:  has No Known Allergies.  MEDICATIONS:  Current Outpatient Prescriptions  Medication Sig Dispense Refill  . acetaminophen (TYLENOL) 325 MG tablet Take 650 mg by mouth every 6 (six) hours as needed for mild pain or moderate pain.      Marland Kitchen albuterol (PROVENTIL) (2.5 MG/3ML) 0.083% nebulizer solution Take 3 mLs (2.5 mg total) by nebulization every 6 (six) hours as needed for wheezing or shortness of breath.  75 mL  12  . antiseptic oral rinse (BIOTENE) LIQD 15 mLs by Mouth Rinse route 2 (two) times daily.  1 Bottle  2  . chlorhexidine (PERIDEX) 0.12 % solution Rinse with 15 mls twice daily for 30 seconds. Use after breakfast and at bedtime. Spit out excess. Do not swallow.  480 mL  prn  . dexamethasone (DECADRON) 4 MG tablet Start the day before Taxotere. Take once the day after, then 2 times a day x 2 days. Repeat in 3 weeks  30 tablet  1  . feeding supplement, ENSURE COMPLETE, (ENSURE COMPLETE) LIQD Take 237 mLs by mouth 2 (two) times daily between meals.      Marland Kitchen  lactulose (CHRONULAC) 10 GM/15ML solution Take 15 mLs (10 g total) by mouth 2 (two) times daily as needed for mild constipation.  240 mL  2  . levofloxacin (LEVAQUIN) 500 MG tablet Take 1 tablet (500 mg total) by mouth daily.  7 tablet  0  . magnesium oxide (MAG-OX) 400 (241.3 MG) MG tablet Take 1 tablet (400 mg total) by mouth daily.  60 tablet  1  . metroNIDAZOLE (FLAGYL) 500 MG tablet Take 1 tablet  (500 mg total) by mouth every 8 (eight) hours.  21 tablet  0  . Morphine Sulfate (MORPHINE CONCENTRATE) 10 mg / 0.5 ml concentrated solution Take 0.25 mLs (5 mg total) by mouth every 2 (two) hours as needed for severe pain.  60 mL  0  . ondansetron (ZOFRAN) 8 MG tablet Take 1 tablet (8 mg total) by mouth every 8 (eight) hours as needed for nausea or vomiting (Nausea or vomiting).  30 tablet  1  . pantoprazole (PROTONIX) 40 MG tablet Take 1 tablet (40 mg total) by mouth daily.  30 tablet  2  . polyethylene glycol (MIRALAX / GLYCOLAX) packet Take 17 g by mouth daily.  30 each  2  . prochlorperazine (COMPAZINE) 10 MG tablet Take 1 tablet (10 mg total) by mouth every 6 (six) hours as needed (Nausea or vomiting).  30 tablet  1  . senna-docusate (SENOKOT-S) 8.6-50 MG per tablet Take 2 tablets by mouth 2 (two) times daily.  120 tablet  0  . temazepam (RESTORIL) 15 MG capsule Take 1 capsule (15 mg total) by mouth at bedtime as needed for sleep.  30 capsule  0   No current facility-administered medications for this visit.    PHYSICAL EXAMINATION: ECOG PERFORMANCE STATUS: 1 - Symptomatic but completely ambulatory  Filed Vitals:   11/15/13 1318  BP: 125/71  Pulse: 88  Temp: 98.2 F (36.8 C)  Resp: 18   Filed Weights   11/15/13 1318  Weight: 114 lb 14.4 oz (52.118 kg)    GENERAL:alert, no distress and comfortable. He looks thin and cachectic SKIN: skin color is pale, texture, turgor are normal, no rashes or significant lesions. The skin around the neck wound does not look cellulitic. EYES: normal, Conjunctiva are pale and non-injected, sclera clear OROPHARYNX:no exudate, no erythema and lips, buccal mucosa, and tongue normal  NECK: supple, thyroid normal size, non-tender, without nodularity LYMPH:  The neck mass is getting smaller. I cleaned out a lot of necrotic mass from the base of the wound. The applied new dressing to it. LUNGS: clear to auscultation and percussion with normal breathing  effort HEART: regular rate & rhythm and no murmurs and no lower extremity edema ABDOMEN:abdomen soft, non-tender and normal bowel sounds Musculoskeletal:no cyanosis of digits and no clubbing  NEURO: alert & oriented x 3 with fluent speech, no focal motor/sensory deficits  LABORATORY DATA:  I have reviewed the data as listed    Component Value Date/Time   NA 137 11/15/2013 1210   NA 133* 11/06/2013 0445   K 4.6 11/15/2013 1210   K 3.5* 11/06/2013 0445   CL 98 11/06/2013 0445   CO2 26 11/15/2013 1210   CO2 21 11/06/2013 0445   GLUCOSE 96 11/15/2013 1210   GLUCOSE 99 11/06/2013 0445   BUN 8.4 11/15/2013 1210   BUN 11 11/06/2013 0445   CREATININE 0.6* 11/15/2013 1210   CREATININE 0.63 11/06/2013 0445   CALCIUM 7.2* 11/15/2013 1210   CALCIUM 7.0* 11/06/2013 0445   PROT 4.7*  11/15/2013 1210   PROT 5.6* 11/03/2013 2220   ALBUMIN 2.4* 11/15/2013 1210   ALBUMIN 2.5* 11/03/2013 2220   AST 14 11/15/2013 1210   AST 7 11/03/2013 2220   ALT 12 11/15/2013 1210   ALT 9 11/03/2013 2220   ALKPHOS 82 11/15/2013 1210   ALKPHOS 53 11/03/2013 2220   BILITOT <0.20 11/15/2013 1210   BILITOT 0.6 11/03/2013 2220   GFRNONAA >90 11/06/2013 0445   GFRAA >90 11/06/2013 0445    No results found for this basename: SPEP, UPEP,  kappa and lambda light chains    Lab Results  Component Value Date   WBC 37.0* 11/15/2013   NEUTROABS 31.5* 11/15/2013   HGB 8.7* 11/15/2013   HCT 26.9* 11/15/2013   MCV 101.5* 11/15/2013   PLT 514* 11/15/2013      Chemistry      Component Value Date/Time   NA 137 11/15/2013 1210   NA 133* 11/06/2013 0445   K 4.6 11/15/2013 1210   K 3.5* 11/06/2013 0445   CL 98 11/06/2013 0445   CO2 26 11/15/2013 1210   CO2 21 11/06/2013 0445   BUN 8.4 11/15/2013 1210   BUN 11 11/06/2013 0445   CREATININE 0.6* 11/15/2013 1210   CREATININE 0.63 11/06/2013 0445      Component Value Date/Time   CALCIUM 7.2* 11/15/2013 1210   CALCIUM 7.0* 11/06/2013 0445   ALKPHOS 82 11/15/2013 1210   ALKPHOS 53 11/03/2013 2220   AST 14  11/15/2013 1210   AST 7 11/03/2013 2220   ALT 12 11/15/2013 1210   ALT 9 11/03/2013 2220   BILITOT <0.20 11/15/2013 1210   BILITOT 0.6 11/03/2013 2220      ASSESSMENT & PLAN:  Cancer of base of tongue Overall, the size of his necrotic neck mass is improving. I will proceed with cycle 2 of treatment this week with dose modification due to profound side effects from cycle 1.  Wound discharge I spent some time cleaning up the necrotic neck wound. Continue daily wound dressing.  Weight loss This is due to recurrent admission and difficulties with eating. I continue to encourage him to increase oral intake and additional nutritional supplements.    Thrombocytosis This is reactive in nature due to necrotic tumor on his neck. I will observe only.    Neck pain His pain is under good control and we will continue to adjust his pain medicines as needed.    Leukocytosis, unspecified This is likely related to the recent Neulasta injection and necrotic tumor on his neck. He is still on antibiotic therapy and will complete his course of treatment.    Hypomagnesemia This is due to side effects of cisplatin. I have given prescription magnesium for placement.    Anemia in neoplastic disease Recent workup such as anemia chronic disease. He is not symptomatic. Recommend observation only. I would transfuse if hemoglobin dropped to less than 8 g.      Diarrhea This was related to recent 5-FU infusion. Continue conservative management. After cycle 2 of treatment, we will proceed to give him daily IV fluids to prevent dehydration and renal failure.  Bilateral leg edema This is due to low protein. I would not want to give him Lasix due to risk of dehydration. Continue monitoring and encourage ambulation.     Orders Placed This Encounter  Procedures  . Hold Tube, Blood Bank    Standing Status: Standing     Number of Occurrences: 9     Standing Expiration Date:  11/16/2014   All  questions were answered. The patient knows to call the clinic with any problems, questions or concerns. No barriers to learning was detected. I spent 40 minutes counseling the patient face to face. The total time spent in the appointment was 55 minutes and more than 50% was on counseling and review of test results     [facility], [place], MD 11/15/2013 1:37 PM

## 2013-11-15 NOTE — Assessment & Plan Note (Signed)
I spent some time cleaning up the necrotic neck wound. Continue daily wound dressing.

## 2013-11-15 NOTE — Assessment & Plan Note (Signed)
This was related to recent 5-FU infusion. Continue conservative management. After cycle 2 of treatment, we will proceed to give him daily IV fluids to prevent dehydration and renal failure.

## 2013-11-15 NOTE — Assessment & Plan Note (Signed)
Overall, the size of his necrotic neck mass is improving. I will proceed with cycle 2 of treatment this week with dose modification due to profound side effects from cycle 1.

## 2013-11-15 NOTE — Assessment & Plan Note (Signed)
This is due to low protein. I would not want to give him Lasix due to risk of dehydration. Continue monitoring and encourage ambulation.

## 2013-11-16 ENCOUNTER — Telehealth: Payer: Self-pay | Admitting: *Deleted

## 2013-11-16 NOTE — Telephone Encounter (Signed)
Wife called to clarify decadron instructions. Dr Alvy Bimler states patient should take 2 tablets today (chemo tomorrow-no pills), Thursday and Friday- 2 tablets at breakfast and 2 tablets at lunch

## 2013-11-17 ENCOUNTER — Ambulatory Visit (HOSPITAL_BASED_OUTPATIENT_CLINIC_OR_DEPARTMENT_OTHER): Payer: Medicaid Other

## 2013-11-17 ENCOUNTER — Ambulatory Visit: Payer: Medicaid Other | Admitting: Nutrition

## 2013-11-17 ENCOUNTER — Other Ambulatory Visit: Payer: Self-pay | Admitting: *Deleted

## 2013-11-17 VITALS — BP 146/77 | HR 101 | Temp 97.9°F

## 2013-11-17 DIAGNOSIS — C01 Malignant neoplasm of base of tongue: Secondary | ICD-10-CM

## 2013-11-17 DIAGNOSIS — Z5111 Encounter for antineoplastic chemotherapy: Secondary | ICD-10-CM

## 2013-11-17 MED ORDER — PALONOSETRON HCL INJECTION 0.25 MG/5ML
0.2500 mg | Freq: Once | INTRAVENOUS | Status: AC
  Administered 2013-11-17: 0.25 mg via INTRAVENOUS

## 2013-11-17 MED ORDER — DEXAMETHASONE SODIUM PHOSPHATE 20 MG/5ML IJ SOLN
12.0000 mg | Freq: Once | INTRAMUSCULAR | Status: AC
  Administered 2013-11-17: 12 mg via INTRAVENOUS

## 2013-11-17 MED ORDER — DEXAMETHASONE SODIUM PHOSPHATE 20 MG/5ML IJ SOLN
INTRAMUSCULAR | Status: AC
  Filled 2013-11-17: qty 5

## 2013-11-17 MED ORDER — SODIUM CHLORIDE 0.9 % IV SOLN
150.0000 mg | Freq: Once | INTRAVENOUS | Status: AC
  Administered 2013-11-17: 150 mg via INTRAVENOUS
  Filled 2013-11-17: qty 5

## 2013-11-17 MED ORDER — SODIUM CHLORIDE 0.9 % IV SOLN
Freq: Once | INTRAVENOUS | Status: AC
  Administered 2013-11-17: 09:00:00 via INTRAVENOUS

## 2013-11-17 MED ORDER — DOCETAXEL CHEMO INJECTION 160 MG/16ML
75.0000 mg/m2 | Freq: Once | INTRAVENOUS | Status: AC
  Administered 2013-11-17: 110 mg via INTRAVENOUS
  Filled 2013-11-17: qty 11

## 2013-11-17 MED ORDER — FLUOROURACIL CHEMO INJECTION 5 GM/100ML: 562.5000 mg/m2/d | INTRAVENOUS | Status: DC

## 2013-11-17 MED ORDER — SODIUM CHLORIDE 0.9 % IV SOLN
562.5000 mg/m2/d | INTRAVENOUS | Status: DC
  Administered 2013-11-17: 4350 mg via INTRAVENOUS
  Filled 2013-11-17: qty 87

## 2013-11-17 MED ORDER — POTASSIUM CHLORIDE 2 MEQ/ML IV SOLN
Freq: Once | INTRAVENOUS | Status: AC
  Administered 2013-11-17: 09:00:00 via INTRAVENOUS
  Filled 2013-11-17: qty 10

## 2013-11-17 MED ORDER — SODIUM CHLORIDE 0.9 % IV SOLN
75.0000 mg/m2 | Freq: Once | INTRAVENOUS | Status: AC
  Administered 2013-11-17: 110 mg via INTRAVENOUS
  Filled 2013-11-17: qty 110

## 2013-11-17 MED ORDER — PALONOSETRON HCL INJECTION 0.25 MG/5ML
INTRAVENOUS | Status: AC
  Filled 2013-11-17: qty 5

## 2013-11-17 NOTE — Progress Notes (Signed)
Pt had not picked up antiemetics. Called [facility] outpatient pharmacy, they are able to fill Rxs. Pt and wife instructed to pick up antiemetics today.

## 2013-11-17 NOTE — Patient Instructions (Signed)
[facility] Discharge Instructions for Patients Receiving Chemotherapy  Today you received the following chemotherapy agents Taxotere/Cisplatin/5FU.  To help prevent nausea and vomiting after your treatment, we encourage you to take your nausea medication as prescribed.   If you develop nausea and vomiting that is not controlled by your nausea medication, call the clinic.   BELOW ARE SYMPTOMS THAT SHOULD BE REPORTED IMMEDIATELY:  *FEVER GREATER THAN 100.5 F  *CHILLS WITH OR WITHOUT FEVER  NAUSEA AND VOMITING THAT IS NOT CONTROLLED WITH YOUR NAUSEA MEDICATION  *UNUSUAL SHORTNESS OF BREATH  *UNUSUAL BRUISING OR BLEEDING  TENDERNESS IN MOUTH AND THROAT WITH OR WITHOUT PRESENCE OF ULCERS  *URINARY PROBLEMS  *BOWEL PROBLEMS  UNUSUAL RASH Items with * indicate a potential emergency and should be followed up as soon as possible.  Feel free to call the clinic you have any questions or concerns. The clinic phone number is [phone].

## 2013-11-17 NOTE — Progress Notes (Addendum)
Nutrition followup completed with patient in chemotherapy.  Patient is receiving chemotherapy for tongue cancer.  He has bilateral leg edema.  His necrotic neck wound is improving and healing per M.D.  Weight has increased and was documented as 114.9 pounds on July 20.  Patient reports oral intake has improved.  Patient states he eats all the time.  Nutrition diagnosis: Unintended weight loss has improved.  Intervention: Patient educated to continue soft, moist diet as tolerated.  Recommended patient continue to alter textures as needed.  Educated patient on how to prepare fortified milk with Carnation breakfast essentials for increased calories and protein.  Provided recipes to take with him today.  Reviewed easy to swallow protein foods.  Questions were answered to teach back method was used.  Monitoring, evaluation, goals: Patient is working to increase oral intake of both calories and protein to promote repletion of lean body mass and continued healing.  Next visit: Tuesday, July 28 in the infusion area.   **Disclaimer: This note was dictated with voice recognition software. Similar sounding words can inadvertently be transcribed and this note may contain transcription errors which may not have been corrected upon publication of note.**

## 2013-11-18 ENCOUNTER — Telehealth: Payer: Self-pay | Admitting: *Deleted

## 2013-11-18 NOTE — Telephone Encounter (Signed)
Called [name] at home [phone] number(s).  Message left requesting a return call for chemotherapy follow up.  Awaiting return call from patient.

## 2013-11-18 NOTE — Telephone Encounter (Signed)
Message copied by Cherylynn Ridges on Thu Nov 18, 2013 11:44 AM ------      Message from: Renford Dills      Created: Wed Nov 17, 2013  2:55 PM      Regarding: chemo follow-up call       Taxotere/Cisplatin/5FU pump  Dr. Alvy Bimler  [phone]  2nd treatment, received 1st treatment in hospital ------

## 2013-11-22 ENCOUNTER — Other Ambulatory Visit (HOSPITAL_BASED_OUTPATIENT_CLINIC_OR_DEPARTMENT_OTHER): Payer: Medicaid Other

## 2013-11-22 ENCOUNTER — Ambulatory Visit (HOSPITAL_BASED_OUTPATIENT_CLINIC_OR_DEPARTMENT_OTHER): Payer: Medicaid Other | Admitting: Hematology and Oncology

## 2013-11-22 ENCOUNTER — Telehealth: Payer: Self-pay | Admitting: Hematology and Oncology

## 2013-11-22 ENCOUNTER — Other Ambulatory Visit: Payer: Self-pay

## 2013-11-22 ENCOUNTER — Encounter: Payer: Self-pay | Admitting: Hematology and Oncology

## 2013-11-22 ENCOUNTER — Ambulatory Visit: Payer: Medicaid Other

## 2013-11-22 ENCOUNTER — Ambulatory Visit (HOSPITAL_BASED_OUTPATIENT_CLINIC_OR_DEPARTMENT_OTHER): Payer: Medicaid Other

## 2013-11-22 VITALS — BP 127/80

## 2013-11-22 VITALS — BP 112/69 | HR 89 | Temp 98.3°F | Resp 20 | Ht 65.0 in | Wt 121.8 lb

## 2013-11-22 DIAGNOSIS — M542 Cervicalgia: Secondary | ICD-10-CM

## 2013-11-22 DIAGNOSIS — E43 Unspecified severe protein-calorie malnutrition: Secondary | ICD-10-CM

## 2013-11-22 DIAGNOSIS — D473 Essential (hemorrhagic) thrombocythemia: Secondary | ICD-10-CM

## 2013-11-22 DIAGNOSIS — C01 Malignant neoplasm of base of tongue: Secondary | ICD-10-CM

## 2013-11-22 DIAGNOSIS — R600 Localized edema: Secondary | ICD-10-CM

## 2013-11-22 DIAGNOSIS — R634 Abnormal weight loss: Secondary | ICD-10-CM

## 2013-11-22 DIAGNOSIS — D630 Anemia in neoplastic disease: Secondary | ICD-10-CM

## 2013-11-22 DIAGNOSIS — T148XXA Other injury of unspecified body region, initial encounter: Secondary | ICD-10-CM

## 2013-11-22 DIAGNOSIS — R609 Edema, unspecified: Secondary | ICD-10-CM

## 2013-11-22 DIAGNOSIS — D75839 Thrombocytosis, unspecified: Secondary | ICD-10-CM

## 2013-11-22 LAB — CBC WITH DIFFERENTIAL/PLATELET
BASO%: 3 % — ABNORMAL HIGH (ref 0.0–2.0)
BASOS ABS: 0.1 10*3/uL (ref 0.0–0.1)
EOS ABS: 0 10*3/uL (ref 0.0–0.5)
EOS%: 0.7 % (ref 0.0–7.0)
HCT: 27.4 % — ABNORMAL LOW (ref 38.4–49.9)
HEMOGLOBIN: 9 g/dL — AB (ref 13.0–17.1)
LYMPH#: 1.2 10*3/uL (ref 0.9–3.3)
LYMPH%: 26.6 % (ref 14.0–49.0)
MCH: 33 pg (ref 27.2–33.4)
MCHC: 32.8 g/dL (ref 32.0–36.0)
MCV: 100.6 fL — ABNORMAL HIGH (ref 79.3–98.0)
MONO#: 0 10*3/uL — ABNORMAL LOW (ref 0.1–0.9)
MONO%: 1 % (ref 0.0–14.0)
NEUT%: 68.7 % (ref 39.0–75.0)
NEUTROS ABS: 3.1 10*3/uL (ref 1.5–6.5)
Platelets: 367 10*3/uL (ref 140–400)
RBC: 2.72 10*6/uL — AB (ref 4.20–5.82)
RDW: 19 % — ABNORMAL HIGH (ref 11.0–14.6)
WBC: 4.4 10*3/uL (ref 4.0–10.3)

## 2013-11-22 LAB — COMPREHENSIVE METABOLIC PANEL (CC13)
ALK PHOS: 51 U/L (ref 40–150)
ALT: 17 U/L (ref 0–55)
AST: 14 U/L (ref 5–34)
Albumin: 2.7 g/dL — ABNORMAL LOW (ref 3.5–5.0)
Anion Gap: 6 mEq/L (ref 3–11)
BUN: 10.3 mg/dL (ref 7.0–26.0)
CHLORIDE: 102 meq/L (ref 98–109)
CO2: 30 mEq/L — ABNORMAL HIGH (ref 22–29)
Calcium: 7.4 mg/dL — ABNORMAL LOW (ref 8.4–10.4)
Creatinine: 0.6 mg/dL — ABNORMAL LOW (ref 0.7–1.3)
GLUCOSE: 90 mg/dL (ref 70–140)
Potassium: 3.9 mEq/L (ref 3.5–5.1)
SODIUM: 137 meq/L (ref 136–145)
TOTAL PROTEIN: 4.9 g/dL — AB (ref 6.4–8.3)
Total Bilirubin: <0.2 mg/dL (ref 0.20–1.20)

## 2013-11-22 LAB — MAGNESIUM (CC13): MAGNESIUM: 1.8 mg/dL (ref 1.5–2.5)

## 2013-11-22 LAB — HOLD TUBE, BLOOD BANK

## 2013-11-22 MED ORDER — SODIUM CHLORIDE 0.9 % IV SOLN
Freq: Once | INTRAVENOUS | Status: AC
  Administered 2013-11-22: 14:00:00 via INTRAVENOUS

## 2013-11-22 MED ORDER — SODIUM CHLORIDE 0.9 % IJ SOLN
10.0000 mL | INTRAMUSCULAR | Status: DC | PRN
  Administered 2013-11-22: 10 mL
  Filled 2013-11-22: qty 10

## 2013-11-22 MED ORDER — HEPARIN SOD (PORK) LOCK FLUSH 100 UNIT/ML IV SOLN
500.0000 [IU] | Freq: Once | INTRAVENOUS | Status: AC | PRN
  Administered 2013-11-22: 500 [IU]
  Filled 2013-11-22: qty 5

## 2013-11-22 MED ORDER — HEPARIN SOD (PORK) LOCK FLUSH 100 UNIT/ML IV SOLN
250.0000 [IU] | Freq: Once | INTRAVENOUS | Status: AC | PRN
  Administered 2013-11-22: 250 [IU]
  Filled 2013-11-22: qty 5

## 2013-11-22 MED ORDER — PEGFILGRASTIM INJECTION 6 MG/0.6ML
6.0000 mg | Freq: Once | SUBCUTANEOUS | Status: DC
  Filled 2013-11-22: qty 0.6

## 2013-11-22 NOTE — Progress Notes (Signed)
Patient in for pump disconnect and labs. Pump disconnected as ordered per MD. Double lumen PICC flushed with 10 mL of saline and 2.5 mL of Heparin. Patient denies any discomfort with flush. Offered to change PICC dressing, but Patient declined stating, "I had it changed last Wednesday on July 22. I will be here this Wednesday, and it can be changed then." Patient to receive Neulasta injection on 11/23/2013 per Dr. Alvy Bimler. All labs drawn by Phlebotomist today.

## 2013-11-22 NOTE — Assessment & Plan Note (Signed)
This is improving Monitor closely 

## 2013-11-22 NOTE — Patient Instructions (Signed)
Pegfilgrastim injection What is this medicine? PEGFILGRASTIM (peg fil GRA stim) is a long-acting granulocyte colony-stimulating factor that stimulates the growth of neutrophils, a type of white blood cell important in the body's fight against infection. It is used to reduce the incidence of fever and infection in patients with certain types of cancer who are receiving chemotherapy that affects the bone marrow. This medicine may be used for other purposes; ask your health care provider or pharmacist if you have questions. COMMON BRAND NAME(S): Neulasta What should I tell my health care provider before I take this medicine? They need to know if you have any of these conditions: -latex allergy -ongoing radiation therapy -sickle cell disease -skin reactions to acrylic adhesives (On-Body Injector only) -an unusual or allergic reaction to pegfilgrastim, filgrastim, other medicines, foods, dyes, or preservatives -pregnant or trying to get pregnant -breast-feeding How should I use this medicine? This medicine is for injection under the skin. If you get this medicine at home, you will be taught how to prepare and give the pre-filled syringe or how to use the On-body Injector. Refer to the patient Instructions for Use for detailed instructions. Use exactly as directed. Take your medicine at regular intervals. Do not take your medicine more often than directed. It is important that you put your used needles and syringes in a special sharps container. Do not put them in a trash can. If you do not have a sharps container, call your pharmacist or healthcare provider to get one. Talk to your pediatrician regarding the use of this medicine in children. Special care may be needed. Overdosage: If you think you have taken too much of this medicine contact a poison control center or emergency room at once. NOTE: This medicine is only for you. Do not share this medicine with others. What if I miss a dose? It is  important not to miss your dose. Call your doctor or health care professional if you miss your dose. If you miss a dose due to an On-body Injector failure or leakage, a new dose should be administered as soon as possible using a single prefilled syringe for manual use. What may interact with this medicine? Interactions have not been studied. Give your health care provider a list of all the medicines, herbs, non-prescription drugs, or dietary supplements you use. Also tell them if you smoke, drink alcohol, or use illegal drugs. Some items may interact with your medicine. This list may not describe all possible interactions. Give your health care provider a list of all the medicines, herbs, non-prescription drugs, or dietary supplements you use. Also tell them if you smoke, drink alcohol, or use illegal drugs. Some items may interact with your medicine. What should I watch for while using this medicine? You may need blood work done while you are taking this medicine. If you are going to need a MRI, CT scan, or other procedure, tell your doctor that you are using this medicine (On-Body Injector only). What side effects may I notice from receiving this medicine? Side effects that you should report to your doctor or health care professional as soon as possible: -allergic reactions like skin rash, itching or hives, swelling of the face, lips, or tongue -dizziness -fever -pain, redness, or irritation at site where injected -pinpoint red spots on the skin -shortness of breath or breathing problems -stomach or side pain, or pain at the shoulder -swelling -tiredness -trouble passing urine Side effects that usually do not require medical attention (report to your doctor   or health care professional if they continue or are bothersome): -bone pain -muscle pain This list may not describe all possible side effects. Call your doctor for medical advice about side effects. You may report side effects to FDA at  1-800-FDA-1088. Where should I keep my medicine? Keep out of the reach of children. Store pre-filled syringes in a refrigerator between 2 and 8 degrees C (36 and 46 degrees F). Do not freeze. Keep in carton to protect from light. Throw away this medicine if it is left out of the refrigerator for more than 48 hours. Throw away any unused medicine after the expiration date. NOTE: This sheet is a summary. It may not cover all possible information. If you have questions about this medicine, talk to your doctor, pharmacist, or health care provider.  2015, [employer] Standard. (2013-07-15 16:14:05)  

## 2013-11-22 NOTE — Assessment & Plan Note (Signed)
Continue daily wound dressing. The skin surrounding the wound does not appear to be infected.

## 2013-11-22 NOTE — Assessment & Plan Note (Signed)
He has remarkable response to chemotherapy. Continue aggressive supportive care. The patient is encouraged.

## 2013-11-22 NOTE — Assessment & Plan Note (Signed)
This has resolved. Monitor closely.

## 2013-11-22 NOTE — Assessment & Plan Note (Signed)
This is improving. He will continue taking pain medicine as needed.

## 2013-11-22 NOTE — Assessment & Plan Note (Signed)
Recent workup such as anemia chronic disease. He is not symptomatic. Recommend observation only. I would transfuse if hemoglobin dropped to less than 8 g.

## 2013-11-22 NOTE — Assessment & Plan Note (Signed)
I continue to encourage him to increase oral intake and additional nutritional supplements. Overall, he is improving.

## 2013-11-22 NOTE — Telephone Encounter (Signed)
gv and printed appt sched and avs for pt for July adn Aug...Marland KitchenMarland KitchenTammi will ck with Dr. Cletus Gash about 8.13 tx

## 2013-11-22 NOTE — Patient Instructions (Signed)

## 2013-11-22 NOTE — Telephone Encounter (Signed)
gv pt updated sched...pt aware of changed to aug sched

## 2013-11-22 NOTE — Assessment & Plan Note (Signed)
This is improving on oral magnesium supplements. Continue the same.

## 2013-11-22 NOTE — Progress Notes (Signed)
[facility] OFFICE PROGRESS NOTE  Patient Care Team: [name], MD as PCP - General (Hematology and Oncology) Brooks Sailors, RN as Oncology Nurse Navigator  SUMMARY OF ONCOLOGIC HISTORY: Oncology History   Cancer of base of tongue   Primary site: Pharynx - Oropharynx   Staging method: AJCC 7th Edition   Clinical: Stage IVB (T2, N3, M0) signed by [name], MD on 10/15/2013 10:25 AM   Summary: Stage IVB (T2, N3, M0)       Cancer of base of tongue   09/17/2013 Imaging CT scan of the neck showed large bilateral neck lymphadenopathy with associated tongue mass.   09/23/2013 Procedure Fine needle aspirate of the left neck mass came back positive for squamous cell carcinoma.   10/04/2013 Surgery Laryngoscopy showed a firm mass within the left tongue base extending past the midline and encompassing much of the tongue base.  There is extension of the mass into the vallecula and on to the lingual surface of the epiglottis.   10/04/2013 Pathology Results Vallecula and tongue biopsy confirmed squamous cell carcinoma.   10/15/2013 Imaging PET/CT scan showed no evidence of distant metastatic disease apart from just regional lymph node involvement.   10/25/2013 - 11/01/2013 Hospital Admission    10/27/2013 -  Chemotherapy He is started on induction chemo with cisplatin, taxotere, 5 FU   11/03/2013 - 11/07/2013 Hospital Admission     INTERVAL HISTORY: Please see below for problem oriented charting. He is seen after cycle 2 of treatment. Overall, the patient is feeling better. The neck mass is getting smaller. He denies recent infection. Diarrhea has resolved. He is eating well and is gaining weight. He has some mild neck pain, resolved with taking pain medicine as needed.  REVIEW OF SYSTEMS:   Constitutional: Denies fevers, chills or abnormal weight loss Eyes: Denies blurriness of vision Ears, nose, mouth, throat, and face: Denies mucositis or sore throat Respiratory: Denies cough,  dyspnea or wheezes Cardiovascular: Denies palpitation, chest discomfort or lower extremity swelling Gastrointestinal:  Denies nausea, heartburn or change in bowel habits Skin: Denies abnormal skin rashes Lymphatics: Denies new lymphadenopathy or easy bruising Neurological:Denies numbness, tingling or new weaknesses Behavioral/Psych: Mood is stable, no new changes  All other systems were reviewed with the patient and are negative.  I have reviewed the past medical history, past surgical history, social history and family history with the patient and they are unchanged from previous note.  ALLERGIES:  has No Known Allergies.  MEDICATIONS:  Current Outpatient Prescriptions  Medication Sig Dispense Refill  . antiseptic oral rinse (BIOTENE) LIQD 15 mLs by Mouth Rinse route 2 (two) times daily.  1 Bottle  2  . chlorhexidine (PERIDEX) 0.12 % solution Rinse with 15 mls twice daily for 30 seconds. Use after breakfast and at bedtime. Spit out excess. Do not swallow.  480 mL  prn  . dexamethasone (DECADRON) 4 MG tablet Start the day before Taxotere. Take once the day after, then 2 times a day x 2 days. Repeat in 3 weeks  30 tablet  1  . feeding supplement, ENSURE COMPLETE, (ENSURE COMPLETE) LIQD Take 237 mLs by mouth 2 (two) times daily between meals.      . lactulose (CHRONULAC) 10 GM/15ML solution Take 15 mLs (10 g total) by mouth 2 (two) times daily as needed for mild constipation.  240 mL  2  . magnesium oxide (MAG-OX) 400 (241.3 MG) MG tablet Take 1 tablet (400 mg total) by mouth daily.  [place]  tablet  1  . Morphine Sulfate (MORPHINE CONCENTRATE) 10 mg / 0.5 ml concentrated solution Take 0.25 mLs (5 mg total) by mouth every 2 (two) hours as needed for severe pain.  60 mL  0  . pantoprazole (PROTONIX) 40 MG tablet Take 1 tablet (40 mg total) by mouth daily.  30 tablet  2  . polyethylene glycol (MIRALAX / GLYCOLAX) packet Take 17 g by mouth daily.  30 each  2  . senna-docusate (SENOKOT-S) 8.6-50 MG per  tablet Take 2 tablets by mouth 2 (two) times daily.  120 tablet  0  . temazepam (RESTORIL) 15 MG capsule Take 1 capsule (15 mg total) by mouth at bedtime as needed for sleep.  30 capsule  0  . acetaminophen (TYLENOL) 325 MG tablet Take 650 mg by mouth every 6 (six) hours as needed for mild pain or moderate pain.      Marland Kitchen albuterol (PROVENTIL) (2.5 MG/3ML) 0.083% nebulizer solution Take 3 mLs (2.5 mg total) by nebulization every 6 (six) hours as needed for wheezing or shortness of breath.  75 mL  12   No current facility-administered medications for this visit.    PHYSICAL EXAMINATION: ECOG PERFORMANCE STATUS: 0 - Asymptomatic  Filed Vitals:   11/22/13 1229  BP: 112/69  Pulse: 89  Temp: 98.3 F (36.8 C)  Resp: 20   Filed Weights   11/22/13 1229  Weight: 121 lb 12.8 oz (55.248 kg)    GENERAL:alert, no distress and comfortable SKIN: skin color, texture, turgor are normal, no rashes or significant lesions EYES: normal, Conjunctiva are pink and non-injected, sclera clear OROPHARYNX:no exudate, no erythema and lips, buccal mucosa, and tongue normal . Unable to appreciate oral mass. NECK: supple, the large necrotic mass is less than half the original size.  LYMPH:  The neck lymphadenopathy is reducing in size. LUNGS: clear to auscultation and percussion with normal breathing effort HEART: regular rate & rhythm and no murmurs and no lower extremity edema ABDOMEN:abdomen soft, non-tender and normal bowel sounds Musculoskeletal:no cyanosis of digits and no clubbing  NEURO: alert & oriented x 3 with fluent speech, no focal motor/sensory deficits  LABORATORY DATA:  I have reviewed the data as listed    Component Value Date/Time   NA 137 11/22/2013 1217   NA 133* 11/06/2013 0445   K 3.9 11/22/2013 1217   K 3.5* 11/06/2013 0445   CL 98 11/06/2013 0445   CO2 30* 11/22/2013 1217   CO2 21 11/06/2013 0445   GLUCOSE 90 11/22/2013 1217   GLUCOSE 99 11/06/2013 0445   BUN 10.3 11/22/2013 1217   BUN  11 11/06/2013 0445   CREATININE 0.6* 11/22/2013 1217   CREATININE 0.63 11/06/2013 0445   CALCIUM 7.4* 11/22/2013 1217   CALCIUM 7.0* 11/06/2013 0445   PROT 4.9* 11/22/2013 1217   PROT 5.6* 11/03/2013 2220   ALBUMIN 2.7* 11/22/2013 1217   ALBUMIN 2.5* 11/03/2013 2220   AST 14 11/22/2013 1217   AST 7 11/03/2013 2220   ALT 17 11/22/2013 1217   ALT 9 11/03/2013 2220   ALKPHOS 51 11/22/2013 1217   ALKPHOS 53 11/03/2013 2220   BILITOT <0.20 11/22/2013 1217   BILITOT 0.6 11/03/2013 2220   GFRNONAA >90 11/06/2013 0445   GFRAA >90 11/06/2013 0445    No results found for this basename: SPEP, UPEP,  kappa and lambda light chains    Lab Results  Component Value Date   WBC 4.4 11/22/2013   NEUTROABS 3.1 11/22/2013   HGB 9.0* 11/22/2013  HCT 27.4* 11/22/2013   MCV 100.6* 11/22/2013   PLT 367 11/22/2013      Chemistry      Component Value Date/Time   NA 137 11/22/2013 1217   NA 133* 11/06/2013 0445   K 3.9 11/22/2013 1217   K 3.5* 11/06/2013 0445   CL 98 11/06/2013 0445   CO2 30* 11/22/2013 1217   CO2 21 11/06/2013 0445   BUN 10.3 11/22/2013 1217   BUN 11 11/06/2013 0445   CREATININE 0.6* 11/22/2013 1217   CREATININE 0.63 11/06/2013 0445      Component Value Date/Time   CALCIUM 7.4* 11/22/2013 1217   CALCIUM 7.0* 11/06/2013 0445   ALKPHOS 51 11/22/2013 1217   ALKPHOS 53 11/03/2013 2220   AST 14 11/22/2013 1217   AST 7 11/03/2013 2220   ALT 17 11/22/2013 1217   ALT 9 11/03/2013 2220   BILITOT <0.20 11/22/2013 1217   BILITOT 0.6 11/03/2013 2220     ASSESSMENT & PLAN:  Cancer of base of tongue He has remarkable response to chemotherapy. Continue aggressive supportive care. The patient is encouraged.  Anemia in neoplastic disease Recent workup such as anemia chronic disease. He is not symptomatic. Recommend observation only. I would transfuse if hemoglobin dropped to less than 8 g.        Protein-calorie malnutrition, severe I continue to encourage him to increase oral intake and additional nutritional  supplements. Overall, he is improving.      Wound discharge Continue daily wound dressing. The skin surrounding the wound does not appear to be infected.    Thrombocytosis This has resolved. Monitor closely.  Neck pain This is improving. He will continue taking pain medicine as needed.  Hypomagnesemia This is improving on oral magnesium supplements. Continue the same.  Bilateral leg edema This is improving. Monitor closely.   No orders of the defined types were placed in this encounter.   All questions were answered. The patient knows to call the clinic with any problems, questions or concerns. No barriers to learning was detected. I spent 25 minutes counseling the patient face to face. The total time spent in the appointment was 30 minutes and more than 50% was on counseling and review of test results     [facility], [place], MD 11/22/2013 9:24 PM

## 2013-11-23 ENCOUNTER — Ambulatory Visit: Payer: Self-pay

## 2013-11-23 ENCOUNTER — Ambulatory Visit: Payer: Medicaid Other | Admitting: Nutrition

## 2013-11-23 ENCOUNTER — Ambulatory Visit (HOSPITAL_BASED_OUTPATIENT_CLINIC_OR_DEPARTMENT_OTHER): Payer: Medicaid Other

## 2013-11-23 DIAGNOSIS — E43 Unspecified severe protein-calorie malnutrition: Secondary | ICD-10-CM

## 2013-11-23 DIAGNOSIS — R634 Abnormal weight loss: Secondary | ICD-10-CM

## 2013-11-23 DIAGNOSIS — C01 Malignant neoplasm of base of tongue: Secondary | ICD-10-CM

## 2013-11-23 DIAGNOSIS — Z5189 Encounter for other specified aftercare: Secondary | ICD-10-CM

## 2013-11-23 MED ORDER — ALTEPLASE 2 MG IJ SOLR
2.0000 mg | Freq: Once | INTRAMUSCULAR | Status: DC | PRN
  Filled 2013-11-23: qty 2

## 2013-11-23 MED ORDER — PEGFILGRASTIM INJECTION 6 MG/0.6ML
6.0000 mg | Freq: Once | SUBCUTANEOUS | Status: AC
  Administered 2013-11-23: 6 mg via SUBCUTANEOUS
  Filled 2013-11-23: qty 0.6

## 2013-11-23 MED ORDER — SODIUM CHLORIDE 0.9 % IV SOLN
Freq: Once | INTRAVENOUS | Status: AC
  Administered 2013-11-23: 15:00:00 via INTRAVENOUS

## 2013-11-23 MED ORDER — SODIUM CHLORIDE 0.9 % IJ SOLN
10.0000 mL | INTRAMUSCULAR | Status: DC | PRN
  Administered 2013-11-23: 10 mL
  Filled 2013-11-23: qty 10

## 2013-11-23 MED ORDER — HEPARIN SOD (PORK) LOCK FLUSH 100 UNIT/ML IV SOLN
250.0000 [IU] | Freq: Once | INTRAVENOUS | Status: AC | PRN
  Administered 2013-11-23: 250 [IU]
  Filled 2013-11-23: qty 5

## 2013-11-23 MED ORDER — HEPARIN SOD (PORK) LOCK FLUSH 100 UNIT/ML IV SOLN
500.0000 [IU] | Freq: Once | INTRAVENOUS | Status: DC | PRN
  Filled 2013-11-23: qty 5

## 2013-11-23 MED ORDER — ONDANSETRON 8 MG/50ML IVPB (CHCC): 8.0000 mg | Freq: Every day | INTRAVENOUS | Status: DC | PRN

## 2013-11-23 MED ORDER — PROMETHAZINE HCL 25 MG/ML IJ SOLN
25.0000 mg | Freq: Every day | INTRAMUSCULAR | Status: DC | PRN
  Filled 2013-11-23: qty 1

## 2013-11-23 NOTE — Progress Notes (Signed)
Patient reports he feels well.  He is eating "all the time."  Weight has increased and was documented as 121.8 pounds July 27.  Bilateral leg edema, improving.  Patient has no nutrition concerns today.  Nutrition diagnosis: Unintended weight loss improved.  Intervention: Patient educated to continue soft, moist diet as needed to promote weight gain.  Encouraged patient to drink oral nutrition supplements as needed.  Questions were answered and teach back method used.  Monitoring, evaluation, goals: Patient is tolerating increased calories and protein to promote weight gain.  Next visit: Wednesday, August 12, in the chemotherapy room.  **Disclaimer: This note was dictated with voice recognition software. Similar sounding words can inadvertently be transcribed and this note may contain transcription errors which may not have been corrected upon publication of note.**

## 2013-11-24 ENCOUNTER — Ambulatory Visit (HOSPITAL_BASED_OUTPATIENT_CLINIC_OR_DEPARTMENT_OTHER): Payer: Medicaid Other

## 2013-11-24 ENCOUNTER — Encounter: Payer: Self-pay | Admitting: Hematology and Oncology

## 2013-11-24 VITALS — BP 151/80 | HR 81 | Temp 98.7°F

## 2013-11-24 DIAGNOSIS — E43 Unspecified severe protein-calorie malnutrition: Secondary | ICD-10-CM

## 2013-11-24 DIAGNOSIS — C01 Malignant neoplasm of base of tongue: Secondary | ICD-10-CM

## 2013-11-24 DIAGNOSIS — R634 Abnormal weight loss: Secondary | ICD-10-CM

## 2013-11-24 MED ORDER — SODIUM CHLORIDE 0.9 % IV SOLN
Freq: Once | INTRAVENOUS | Status: AC
  Administered 2013-11-24: 15:00:00 via INTRAVENOUS

## 2013-11-24 MED ORDER — HEPARIN SOD (PORK) LOCK FLUSH 100 UNIT/ML IV SOLN
250.0000 [IU] | Freq: Once | INTRAVENOUS | Status: AC | PRN
  Administered 2013-11-24: 250 [IU]
  Filled 2013-11-24: qty 5

## 2013-11-24 MED ORDER — SODIUM CHLORIDE 0.9 % IJ SOLN
10.0000 mL | INTRAMUSCULAR | Status: DC | PRN
  Administered 2013-11-24: 10 mL
  Filled 2013-11-24: qty 10

## 2013-11-24 NOTE — Patient Instructions (Signed)

## 2013-11-24 NOTE — Progress Notes (Signed)
Taxotere, cisplatin and flourouracil are not replaceable drugs

## 2013-11-25 ENCOUNTER — Ambulatory Visit: Payer: Self-pay

## 2013-11-25 ENCOUNTER — Ambulatory Visit (HOSPITAL_BASED_OUTPATIENT_CLINIC_OR_DEPARTMENT_OTHER): Payer: Medicaid Other

## 2013-11-25 VITALS — BP 136/82 | HR 78 | Temp 98.3°F | Resp 18

## 2013-11-25 DIAGNOSIS — E43 Unspecified severe protein-calorie malnutrition: Secondary | ICD-10-CM

## 2013-11-25 DIAGNOSIS — Z5189 Encounter for other specified aftercare: Secondary | ICD-10-CM

## 2013-11-25 DIAGNOSIS — R634 Abnormal weight loss: Secondary | ICD-10-CM

## 2013-11-25 DIAGNOSIS — C01 Malignant neoplasm of base of tongue: Secondary | ICD-10-CM

## 2013-11-25 MED ORDER — HEPARIN SOD (PORK) LOCK FLUSH 100 UNIT/ML IV SOLN
250.0000 [IU] | Freq: Once | INTRAVENOUS | Status: AC | PRN
  Administered 2013-11-25: 250 [IU]
  Filled 2013-11-25: qty 5

## 2013-11-25 MED ORDER — SODIUM CHLORIDE 0.9 % IJ SOLN
10.0000 mL | INTRAMUSCULAR | Status: DC | PRN
  Administered 2013-11-25: 10 mL
  Filled 2013-11-25: qty 10

## 2013-11-25 MED ORDER — SODIUM CHLORIDE 0.9 % IV SOLN
Freq: Once | INTRAVENOUS | Status: AC
  Administered 2013-11-25: 15:00:00 via INTRAVENOUS

## 2013-11-25 NOTE — Patient Instructions (Signed)
Dehydration, Adult Dehydration is when you lose more fluids from the body than you take in. Vital organs like the kidneys, brain, and heart cannot function without a proper amount of fluids and salt. Any loss of fluids from the body can cause dehydration.  CAUSES   Vomiting.  Diarrhea.  Excessive sweating.  Excessive urine output.  Fever. SYMPTOMS  Mild dehydration  Thirst.  Dry lips.  Slightly dry mouth. Moderate dehydration  Very dry mouth.  Sunken eyes.  Skin does not bounce back quickly when lightly pinched and released.  Dark urine and decreased urine production.  Decreased tear production.  Headache. Severe dehydration  Very dry mouth.  Extreme thirst.  Rapid, weak pulse (more than 100 beats per minute at rest).  Cold hands and feet.  Not able to sweat in spite of heat and temperature.  Rapid breathing.  Blue lips.  Confusion and lethargy.  Difficulty being awakened.  Minimal urine production.  No tears. DIAGNOSIS  Your caregiver will diagnose dehydration based on your symptoms and your exam. Blood and urine tests will help confirm the diagnosis. The diagnostic evaluation should also identify the cause of dehydration. TREATMENT  Treatment of mild or moderate dehydration can often be done at home by increasing the amount of fluids that you drink. It is best to drink small amounts of fluid more often. Drinking too much at one time can make vomiting worse. Refer to the home care instructions below. Severe dehydration needs to be treated at the hospital where you will probably be given intravenous (IV) fluids that contain water and electrolytes. HOME CARE INSTRUCTIONS   Ask your caregiver about specific rehydration instructions.  Drink enough fluids to keep your urine clear or pale yellow.  Drink small amounts frequently if you have nausea and vomiting.  Eat as you normally do.  Avoid:  Foods or drinks high in sugar.  Carbonated  drinks.  Juice.  Extremely hot or cold fluids.  Drinks with caffeine.  Fatty, greasy foods.  Alcohol.  Tobacco.  Overeating.  Gelatin desserts.  Wash your hands well to avoid spreading bacteria and viruses.  Only take over-the-counter or prescription medicines for pain, discomfort, or fever as directed by your caregiver.  Ask your caregiver if you should continue all prescribed and over-the-counter medicines.  Keep all follow-up appointments with your caregiver. SEEK MEDICAL CARE IF:  You have abdominal pain and it increases or stays in one area (localizes).  You have a rash, stiff neck, or severe headache.  You are irritable, sleepy, or difficult to awaken.  You are weak, dizzy, or extremely thirsty. SEEK IMMEDIATE MEDICAL CARE IF:   You are unable to keep fluids down or you get worse despite treatment.  You have frequent episodes of vomiting or diarrhea.  You have blood or green matter (bile) in your vomit.  You have blood in your stool or your stool looks black and tarry.  You have not urinated in 6 to 8 hours, or you have only urinated a small amount of very dark urine.  You have a fever.  You faint. MAKE SURE YOU:   Understand these instructions.  Will watch your condition.  Will get help right away if you are not doing well or get worse. Document Released: 04/15/2005 Document Revised: 07/08/2011 Document Reviewed: 12/03/2010 ExitCare Patient Information 2015 [employer]. This information is not intended to replace advice given to you by your health care provider. Make sure you discuss any questions you have with your health care   provider.  

## 2013-11-26 ENCOUNTER — Encounter: Payer: Self-pay | Admitting: *Deleted

## 2013-11-26 ENCOUNTER — Encounter: Payer: Self-pay | Admitting: Specialist

## 2013-11-26 ENCOUNTER — Telehealth: Payer: Self-pay | Admitting: Hematology and Oncology

## 2013-11-26 ENCOUNTER — Ambulatory Visit: Payer: Self-pay

## 2013-11-26 ENCOUNTER — Ambulatory Visit (HOSPITAL_BASED_OUTPATIENT_CLINIC_OR_DEPARTMENT_OTHER): Payer: Medicaid Other

## 2013-11-26 VITALS — BP 110/75 | HR 81 | Temp 98.2°F | Resp 18

## 2013-11-26 DIAGNOSIS — E860 Dehydration: Secondary | ICD-10-CM

## 2013-11-26 DIAGNOSIS — R634 Abnormal weight loss: Secondary | ICD-10-CM

## 2013-11-26 DIAGNOSIS — C01 Malignant neoplasm of base of tongue: Secondary | ICD-10-CM

## 2013-11-26 MED ORDER — SODIUM CHLORIDE 0.9 % IV SOLN
Freq: Once | INTRAVENOUS | Status: AC
  Administered 2013-11-26: 14:00:00 via INTRAVENOUS

## 2013-11-26 MED ORDER — SODIUM CHLORIDE 0.9 % IJ SOLN
10.0000 mL | INTRAMUSCULAR | Status: DC | PRN
  Administered 2013-11-26: 10 mL
  Filled 2013-11-26: qty 10

## 2013-11-26 MED ORDER — HEPARIN SOD (PORK) LOCK FLUSH 100 UNIT/ML IV SOLN
250.0000 [IU] | Freq: Once | INTRAVENOUS | Status: AC | PRN
  Administered 2013-11-26: 250 [IU]
  Filled 2013-11-26: qty 5

## 2013-11-26 NOTE — Progress Notes (Signed)
Attempted to contact patient and spouse, [name], per request from Navigator. Was unable to reach them, but left a message. Will try again Monday.  Epifania Gore, PhD, [place]

## 2013-11-26 NOTE — Telephone Encounter (Signed)
Pt confirmed flush added per 07/31 POF, gave pt AVS....KJ

## 2013-11-26 NOTE — Patient Instructions (Signed)
Dehydration, Adult Dehydration is when you lose more fluids from the body than you take in. Vital organs like the kidneys, brain, and heart cannot function without a proper amount of fluids and salt. Any loss of fluids from the body can cause dehydration.  CAUSES   Vomiting.  Diarrhea.  Excessive sweating.  Excessive urine output.  Fever. SYMPTOMS  Mild dehydration  Thirst.  Dry lips.  Slightly dry mouth. Moderate dehydration  Very dry mouth.  Sunken eyes.  Skin does not bounce back quickly when lightly pinched and released.  Dark urine and decreased urine production.  Decreased tear production.  Headache. Severe dehydration  Very dry mouth.  Extreme thirst.  Rapid, weak pulse (more than 100 beats per minute at rest).  Cold hands and feet.  Not able to sweat in spite of heat and temperature.  Rapid breathing.  Blue lips.  Confusion and lethargy.  Difficulty being awakened.  Minimal urine production.  No tears. DIAGNOSIS  Your caregiver will diagnose dehydration based on your symptoms and your exam. Blood and urine tests will help confirm the diagnosis. The diagnostic evaluation should also identify the cause of dehydration. TREATMENT  Treatment of mild or moderate dehydration can often be done at home by increasing the amount of fluids that you drink. It is best to drink small amounts of fluid more often. Drinking too much at one time can make vomiting worse. Refer to the home care instructions below. Severe dehydration needs to be treated at the hospital where you will probably be given intravenous (IV) fluids that contain water and electrolytes. HOME CARE INSTRUCTIONS   Ask your caregiver about specific rehydration instructions.  Drink enough fluids to keep your urine clear or pale yellow.  Drink small amounts frequently if you have nausea and vomiting.  Eat as you normally do.  Avoid:  Foods or drinks high in sugar.  Carbonated  drinks.  Juice.  Extremely hot or cold fluids.  Drinks with caffeine.  Fatty, greasy foods.  Alcohol.  Tobacco.  Overeating.  Gelatin desserts.  Wash your hands well to avoid spreading bacteria and viruses.  Only take over-the-counter or prescription medicines for pain, discomfort, or fever as directed by your caregiver.  Ask your caregiver if you should continue all prescribed and over-the-counter medicines.  Keep all follow-up appointments with your caregiver. SEEK MEDICAL CARE IF:  You have abdominal pain and it increases or stays in one area (localizes).  You have a rash, stiff neck, or severe headache.  You are irritable, sleepy, or difficult to awaken.  You are weak, dizzy, or extremely thirsty. SEEK IMMEDIATE MEDICAL CARE IF:   You are unable to keep fluids down or you get worse despite treatment.  You have frequent episodes of vomiting or diarrhea.  You have blood or green matter (bile) in your vomit.  You have blood in your stool or your stool looks black and tarry.  You have not urinated in 6 to 8 hours, or you have only urinated a small amount of very dark urine.  You have a fever.  You faint. MAKE SURE YOU:   Understand these instructions.  Will watch your condition.  Will get help right away if you are not doing well or get worse. Document Released: 04/15/2005 Document Revised: 07/08/2011 Document Reviewed: 12/03/2010 ExitCare Patient Information 2015 [employer]. This information is not intended to replace advice given to you by your health care provider. Make sure you discuss any questions you have with your health care   provider.  

## 2013-11-26 NOTE — Progress Notes (Signed)
Disability claim forms dropped off by pt and forwarded to Carmelina Noun in managed care dept for completion.

## 2013-11-29 ENCOUNTER — Telehealth: Payer: Self-pay | Admitting: Hematology and Oncology

## 2013-11-29 ENCOUNTER — Encounter: Payer: Self-pay | Admitting: Hematology and Oncology

## 2013-11-29 ENCOUNTER — Telehealth: Payer: Self-pay | Admitting: *Deleted

## 2013-11-29 ENCOUNTER — Ambulatory Visit (HOSPITAL_BASED_OUTPATIENT_CLINIC_OR_DEPARTMENT_OTHER): Payer: Medicaid Other | Admitting: Hematology and Oncology

## 2013-11-29 ENCOUNTER — Ambulatory Visit (HOSPITAL_BASED_OUTPATIENT_CLINIC_OR_DEPARTMENT_OTHER): Payer: Medicaid Other

## 2013-11-29 VITALS — BP 110/66 | HR 88 | Temp 98.4°F | Resp 17 | Ht 65.0 in | Wt 116.9 lb

## 2013-11-29 DIAGNOSIS — E43 Unspecified severe protein-calorie malnutrition: Secondary | ICD-10-CM

## 2013-11-29 DIAGNOSIS — Z452 Encounter for adjustment and management of vascular access device: Secondary | ICD-10-CM

## 2013-11-29 DIAGNOSIS — C01 Malignant neoplasm of base of tongue: Secondary | ICD-10-CM

## 2013-11-29 DIAGNOSIS — R609 Edema, unspecified: Secondary | ICD-10-CM

## 2013-11-29 DIAGNOSIS — R600 Localized edema: Secondary | ICD-10-CM

## 2013-11-29 MED ORDER — HEPARIN SOD (PORK) LOCK FLUSH 100 UNIT/ML IV SOLN
500.0000 [IU] | Freq: Once | INTRAVENOUS | Status: AC
  Administered 2013-11-29: 500 [IU] via INTRAVENOUS
  Filled 2013-11-29: qty 5

## 2013-11-29 MED ORDER — SODIUM CHLORIDE 0.9 % IJ SOLN
10.0000 mL | INTRAMUSCULAR | Status: DC | PRN
  Administered 2013-11-29: 10 mL via INTRAVENOUS
  Filled 2013-11-29: qty 10

## 2013-11-29 NOTE — Assessment & Plan Note (Signed)
This is due to immobility and low serum protein. I would rather not prescribe Lasix therapy. I recommend elastic compression hose and increased mobility.

## 2013-11-29 NOTE — Progress Notes (Signed)
[name] OFFICE PROGRESS NOTE  Patient Care Team: [name], MD as PCP - General (Hematology and Oncology) Brooks Sailors, RN as Oncology Nurse Navigator  SUMMARY OF ONCOLOGIC HISTORY: Oncology History   Cancer of base of tongue   Primary site: Pharynx - Oropharynx   Staging method: AJCC 7th Edition   Clinical: Stage IVB (T2, N3, M0) signed by [name], MD on 10/15/2013 10:25 AM   Summary: Stage IVB (T2, N3, M0)       Cancer of base of tongue   09/17/2013 Imaging CT scan of the neck showed large bilateral neck lymphadenopathy with associated tongue mass.   09/23/2013 Procedure Fine needle aspirate of the left neck mass came back positive for squamous cell carcinoma.   10/04/2013 Surgery Laryngoscopy showed a firm mass within the left tongue base extending past the midline and encompassing much of the tongue base.  There is extension of the mass into the vallecula and on to the lingual surface of the epiglottis.   10/04/2013 Pathology Results Vallecula and tongue biopsy confirmed squamous cell carcinoma.   10/15/2013 Imaging PET/CT scan showed no evidence of distant metastatic disease apart from just regional lymph node involvement.   10/25/2013 - 11/01/2013 Hospital Admission    10/27/2013 -  Chemotherapy He is started on induction chemo with cisplatin, taxotere, 5 FU   11/03/2013 - 11/07/2013 Hospital Admission     INTERVAL HISTORY: Please see below for problem oriented charting. He is seen today requesting urgent visit for worsening leg edema. He tolerated cycle 2 treatment well last week without side effects. His oral intake has reduced due to anorexia and has lost 5 pounds. Denies recent vomiting or diarrhea.  REVIEW OF SYSTEMS:   All other systems were reviewed with the patient and are negative.  I have reviewed the past medical history, past surgical history, social history and family history with the patient and they are unchanged from previous  note.  ALLERGIES:  has No Known Allergies.  MEDICATIONS:  Current Outpatient Prescriptions  Medication Sig Dispense Refill  . acetaminophen (TYLENOL) 325 MG tablet Take 650 mg by mouth every 6 (six) hours as needed for mild pain or moderate pain.      Marland Kitchen albuterol (PROVENTIL) (2.5 MG/3ML) 0.083% nebulizer solution Take 3 mLs (2.5 mg total) by nebulization every 6 (six) hours as needed for wheezing or shortness of breath.  75 mL  12  . antiseptic oral rinse (BIOTENE) LIQD 15 mLs by Mouth Rinse route 2 (two) times daily.  1 Bottle  2  . chlorhexidine (PERIDEX) 0.12 % solution Rinse with 15 mls twice daily for 30 seconds. Use after breakfast and at bedtime. Spit out excess. Do not swallow.  480 mL  prn  . dexamethasone (DECADRON) 4 MG tablet Start the day before Taxotere. Take once the day after, then 2 times a day x 2 days. Repeat in 3 weeks  30 tablet  1  . feeding supplement, ENSURE COMPLETE, (ENSURE COMPLETE) LIQD Take 237 mLs by mouth 2 (two) times daily between meals.      . lactulose (CHRONULAC) 10 GM/15ML solution Take 15 mLs (10 g total) by mouth 2 (two) times daily as needed for mild constipation.  240 mL  2  . magnesium oxide (MAG-OX) 400 (241.3 MG) MG tablet Take 1 tablet (400 mg total) by mouth daily.  60 tablet  1  . Morphine Sulfate (MORPHINE CONCENTRATE) 10 mg / 0.5 ml concentrated solution Take 0.25 mLs (5 mg  total) by mouth every 2 (two) hours as needed for severe pain.  60 mL  0  . pantoprazole (PROTONIX) 40 MG tablet Take 1 tablet (40 mg total) by mouth daily.  30 tablet  2  . polyethylene glycol (MIRALAX / GLYCOLAX) packet Take 17 g by mouth daily.  30 each  2  . senna-docusate (SENOKOT-S) 8.6-50 MG per tablet Take 2 tablets by mouth 2 (two) times daily.  120 tablet  0  . temazepam (RESTORIL) 15 MG capsule Take 1 capsule (15 mg total) by mouth at bedtime as needed for sleep.  30 capsule  0   No current facility-administered medications for this visit.    PHYSICAL  EXAMINATION: ECOG PERFORMANCE STATUS: 1 - Symptomatic but completely ambulatory  Filed Vitals:   11/29/13 1431  BP: 110/66  Pulse: 88  Temp: 98.4 F (36.9 C)  Resp: 17   Filed Weights   11/29/13 1431  Weight: 116 lb 14.4 oz (53.025 kg)    GENERAL:alert, no distress and comfortable. He looks thin and cachectic SKIN: skin color, texture, turgor are normal, no rashes or significant lesions HEART: Severe bilateral lower extremity edema AMusculoskeletal:no cyanosis of digits and no clubbing  NEURO: alert & oriented x 3 with fluent speech, no focal motor/sensory deficits  LABORATORY DATA:  I have reviewed the data as listed    Component Value Date/Time   NA 137 11/22/2013 1217   NA 133* 11/06/2013 0445   K 3.9 11/22/2013 1217   K 3.5* 11/06/2013 0445   CL 98 11/06/2013 0445   CO2 30* 11/22/2013 1217   CO2 21 11/06/2013 0445   GLUCOSE 90 11/22/2013 1217   GLUCOSE 99 11/06/2013 0445   BUN 10.3 11/22/2013 1217   BUN 11 11/06/2013 0445   CREATININE 0.6* 11/22/2013 1217   CREATININE 0.63 11/06/2013 0445   CALCIUM 7.4* 11/22/2013 1217   CALCIUM 7.0* 11/06/2013 0445   PROT 4.9* 11/22/2013 1217   PROT 5.6* 11/03/2013 2220   ALBUMIN 2.7* 11/22/2013 1217   ALBUMIN 2.5* 11/03/2013 2220   AST 14 11/22/2013 1217   AST 7 11/03/2013 2220   ALT 17 11/22/2013 1217   ALT 9 11/03/2013 2220   ALKPHOS 51 11/22/2013 1217   ALKPHOS 53 11/03/2013 2220   BILITOT <0.20 11/22/2013 1217   BILITOT 0.6 11/03/2013 2220   GFRNONAA >90 11/06/2013 0445   GFRAA >90 11/06/2013 0445    No results found for this basename: SPEP, UPEP,  kappa and lambda light chains    Lab Results  Component Value Date   WBC 4.4 11/22/2013   NEUTROABS 3.1 11/22/2013   HGB 9.0* 11/22/2013   HCT 27.4* 11/22/2013   MCV 100.6* 11/22/2013   PLT 367 11/22/2013      Chemistry      Component Value Date/Time   NA 137 11/22/2013 1217   NA 133* 11/06/2013 0445   K 3.9 11/22/2013 1217   K 3.5* 11/06/2013 0445   CL 98 11/06/2013 0445   CO2 30* 11/22/2013 1217    CO2 21 11/06/2013 0445   BUN 10.3 11/22/2013 1217   BUN 11 11/06/2013 0445   CREATININE 0.6* 11/22/2013 1217   CREATININE 0.63 11/06/2013 0445      Component Value Date/Time   CALCIUM 7.4* 11/22/2013 1217   CALCIUM 7.0* 11/06/2013 0445   ALKPHOS 51 11/22/2013 1217   ALKPHOS 53 11/03/2013 2220   AST 14 11/22/2013 1217   AST 7 11/03/2013 2220   ALT 17 11/22/2013 1217   ALT  9 11/03/2013 2220   BILITOT <0.20 11/22/2013 1217   BILITOT 0.6 11/03/2013 2220       ASSESSMENT & PLAN:  Bilateral leg edema This is due to immobility and low serum protein. I would rather not prescribe Lasix therapy. I recommend elastic compression hose and increased mobility.  Protein-calorie malnutrition, severe He had lost 5 pounds since I saw him. I recommend increase oral food intake.  Cancer of base of tongue He has remarkable response to chemotherapy. Continue aggressive supportive care. The patient is encouraged.     No orders of the defined types were placed in this encounter.   All questions were answered. The patient knows to call the clinic with any problems, questions or concerns. No barriers to learning was detected. I spent 15 minutes counseling the patient face to face. The total time spent in the appointment was 20 minutes and more than 50% was on counseling and review of test results     [facility], [place], MD 11/29/2013 2:51 PM

## 2013-11-29 NOTE — Patient Instructions (Signed)
PICC Home Guide A peripherally inserted central catheter (PICC) is a long, thin, flexible tube that is inserted into a vein in the upper arm. It is a form of intravenous (IV) access. It is considered to be a "central" line because the tip of the PICC ends in a large vein in your chest. This large vein is called the superior vena cava (SVC). The PICC tip ends in the SVC because there is a lot of blood flow in the SVC. This allows medicines and IV fluids to be quickly distributed throughout the body. The PICC is inserted using a sterile technique by a specially trained nurse or physician. After the PICC is inserted, a chest X-ray exam is done to be sure it is in the correct place.  A PICC may be placed for different reasons, such as:  To give medicines and liquid nutrition that can only be given through a central line. Examples are:  Certain antibiotic treatments.  Chemotherapy.  Total parenteral nutrition (TPN).  To take frequent blood samples.  To give IV fluids and blood products.  If there is difficulty placing a peripheral intravenous (PIV) catheter. If taken care of properly, a PICC can remain in place for several months. A PICC can also allow a person to go home from the hospital early. Medicine and PICC care can be managed at home by a family member or home health care team. WHAT PROBLEMS CAN HAPPEN WHEN I HAVE A PICC? Problems with a PICC can occasionally occur. These may include the following:  A blood clot (thrombus) forming in or at the tip of the PICC. This can cause the PICC to become clogged. A clot-dissolving medicine called tissue plasminogen activator (tPA) can be given through the PICC to help break up the clot.  Inflammation of the vein (phlebitis) in which the PICC is placed. Signs of inflammation may include redness, pain at the insertion site, red streaks, or being able to feel a "cord" in the vein where the PICC is located.  Infection in the PICC or at the insertion  site. Signs of infection may include fever, chills, redness, swelling, or pus drainage from the PICC insertion site.  PICC movement (malposition). The PICC tip may move from its original position due to excessive physical activity, forceful coughing, sneezing, or vomiting.  A break or cut in the PICC. It is important to not use scissors near the PICC.  Nerve or tendon irritation or injury during PICC insertion. WHAT SHOULD I KEEP IN MIND ABOUT ACTIVITIES WHEN I HAVE A PICC?  You may bend your arm and move it freely. If your PICC is near or at the bend of your elbow, avoid activity with repeated motion at the elbow.  Rest at home for the remainder of the day following PICC line insertion.  Avoid lifting heavy objects as instructed by your health care provider.  Avoid using a crutch with the arm on the same side as your PICC. You may need to use a walker. WHAT SHOULD I KNOW ABOUT MY PICC DRESSING?  Keep your PICC bandage (dressing) clean and dry to prevent infection.  Ask your health care provider when you may shower. Ask your health care provider to teach you how to wrap the PICC when you do take a shower.  Change the PICC dressing as instructed by your health care provider.  Change your PICC dressing if it becomes loose or wet. WHAT SHOULD I KNOW ABOUT PICC CARE?  Check the PICC insertion site   daily for leakage, redness, swelling, or pain.  Do not take a bath, swim, or use hot tubs when you have a PICC. Cover PICC line with clear plastic wrap and tape to keep it dry while showering.  Flush the PICC as directed by your health care provider. Let your health care provider know right away if the PICC is difficult to flush or does not flush. Do not use force to flush the PICC.  Do not use a syringe that is less than 10 mL to flush the PICC.  Never pull or tug on the PICC.  Avoid blood pressure checks on the arm with the PICC.  Keep your PICC identification card with you at all  times.  Do not take the PICC out yourself. Only a trained clinical professional should remove the PICC. SEEK IMMEDIATE MEDICAL CARE IF:  Your PICC is accidentally pulled all the way out. If this happens, cover the insertion site with a bandage or gauze dressing. Do not throw the PICC away. Your health care provider will need to inspect it.  Your PICC was tugged or pulled and has partially come out. Do not  push the PICC back in.  There is any type of drainage, redness, or swelling where the PICC enters the skin.  You cannot flush the PICC, it is difficult to flush, or the PICC leaks around the insertion site when it is flushed.  You hear a "flushing" sound when the PICC is flushed.  You have pain, discomfort, or numbness in your arm, shoulder, or jaw on the same side as the PICC.  You feel your heart "racing" or skipping beats.  You notice a hole or tear in the PICC.  You develop chills or a fever. MAKE SURE YOU:   Understand these instructions.  Will watch your condition.  Will get help right away if you are not doing well or get worse. Document Released: 10/20/2002 Document Revised: 08/30/2013 Document Reviewed: 12/21/2012 Exit[employer] Patient Information 2015 [employer]. This information is not intended to replace advice given to you by your health care provider. Make sure you discuss any questions you have with your health care provider.  

## 2013-11-29 NOTE — Telephone Encounter (Signed)
s.w. pt and advised on todays appt.....pt ok and aware °

## 2013-11-29 NOTE — Assessment & Plan Note (Signed)
He had lost 5 pounds since I saw him. I recommend increase oral food intake.

## 2013-11-29 NOTE — Assessment & Plan Note (Signed)
He has remarkable response to chemotherapy. Continue aggressive supportive care. The patient is encouraged.

## 2013-11-30 ENCOUNTER — Encounter: Payer: Self-pay | Admitting: Hematology and Oncology

## 2013-11-30 NOTE — Progress Notes (Signed)
Put disability form on nurse's desk. °

## 2013-12-01 ENCOUNTER — Ambulatory Visit (HOSPITAL_BASED_OUTPATIENT_CLINIC_OR_DEPARTMENT_OTHER): Payer: Medicaid Other

## 2013-12-01 ENCOUNTER — Encounter: Payer: Self-pay | Admitting: Hematology and Oncology

## 2013-12-01 VITALS — BP 128/86 | HR 91 | Temp 98.3°F

## 2013-12-01 DIAGNOSIS — C01 Malignant neoplasm of base of tongue: Secondary | ICD-10-CM

## 2013-12-01 DIAGNOSIS — Z452 Encounter for adjustment and management of vascular access device: Secondary | ICD-10-CM

## 2013-12-01 MED ORDER — SODIUM CHLORIDE 0.9 % IJ SOLN
10.0000 mL | INTRAMUSCULAR | Status: DC | PRN
  Administered 2013-12-01: 10 mL via INTRAVENOUS
  Filled 2013-12-01: qty 10

## 2013-12-01 MED ORDER — HEPARIN SOD (PORK) LOCK FLUSH 100 UNIT/ML IV SOLN
500.0000 [IU] | Freq: Once | INTRAVENOUS | Status: AC
  Administered 2013-12-01: 250 [IU] via INTRAVENOUS
  Filled 2013-12-01: qty 5

## 2013-12-01 NOTE — Patient Instructions (Signed)
PICC Home Guide A peripherally inserted central catheter (PICC) is a long, thin, flexible tube that is inserted into a vein in the upper arm. It is a form of intravenous (IV) access. It is considered to be a "central" line because the tip of the PICC ends in a large vein in your chest. This large vein is called the superior vena cava (SVC). The PICC tip ends in the SVC because there is a lot of blood flow in the SVC. This allows medicines and IV fluids to be quickly distributed throughout the body. The PICC is inserted using a sterile technique by a specially trained nurse or physician. After the PICC is inserted, a chest X-ray exam is done to be sure it is in the correct place.  A PICC may be placed for different reasons, such as:  To give medicines and liquid nutrition that can only be given through a central line. Examples are:  Certain antibiotic treatments.  Chemotherapy.  Total parenteral nutrition (TPN).  To take frequent blood samples.  To give IV fluids and blood products.  If there is difficulty placing a peripheral intravenous (PIV) catheter. If taken care of properly, a PICC can remain in place for several months. A PICC can also allow a person to go home from the hospital early. Medicine and PICC care can be managed at home by a family member or home health care team. WHAT PROBLEMS CAN HAPPEN WHEN I HAVE A PICC? Problems with a PICC can occasionally occur. These may include the following:  A blood clot (thrombus) forming in or at the tip of the PICC. This can cause the PICC to become clogged. A clot-dissolving medicine called tissue plasminogen activator (tPA) can be given through the PICC to help break up the clot.  Inflammation of the vein (phlebitis) in which the PICC is placed. Signs of inflammation may include redness, pain at the insertion site, red streaks, or being able to feel a "cord" in the vein where the PICC is located.  Infection in the PICC or at the insertion  site. Signs of infection may include fever, chills, redness, swelling, or pus drainage from the PICC insertion site.  PICC movement (malposition). The PICC tip may move from its original position due to excessive physical activity, forceful coughing, sneezing, or vomiting.  A break or cut in the PICC. It is important to not use scissors near the PICC.  Nerve or tendon irritation or injury during PICC insertion. WHAT SHOULD I KEEP IN MIND ABOUT ACTIVITIES WHEN I HAVE A PICC?  You may bend your arm and move it freely. If your PICC is near or at the bend of your elbow, avoid activity with repeated motion at the elbow.  Rest at home for the remainder of the day following PICC line insertion.  Avoid lifting heavy objects as instructed by your health care provider.  Avoid using a crutch with the arm on the same side as your PICC. You may need to use a walker. WHAT SHOULD I KNOW ABOUT MY PICC DRESSING?  Keep your PICC bandage (dressing) clean and dry to prevent infection.  Ask your health care provider when you may shower. Ask your health care provider to teach you how to wrap the PICC when you do take a shower.  Change the PICC dressing as instructed by your health care provider.  Change your PICC dressing if it becomes loose or wet. WHAT SHOULD I KNOW ABOUT PICC CARE?  Check the PICC insertion site   daily for leakage, redness, swelling, or pain.  Do not take a bath, swim, or use hot tubs when you have a PICC. Cover PICC line with clear plastic wrap and tape to keep it dry while showering.  Flush the PICC as directed by your health care provider. Let your health care provider know right away if the PICC is difficult to flush or does not flush. Do not use force to flush the PICC.  Do not use a syringe that is less than 10 mL to flush the PICC.  Never pull or tug on the PICC.  Avoid blood pressure checks on the arm with the PICC.  Keep your PICC identification card with you at all  times.  Do not take the PICC out yourself. Only a trained clinical professional should remove the PICC. SEEK IMMEDIATE MEDICAL CARE IF:  Your PICC is accidentally pulled all the way out. If this happens, cover the insertion site with a bandage or gauze dressing. Do not throw the PICC away. Your health care provider will need to inspect it.  Your PICC was tugged or pulled and has partially come out. Do not  push the PICC back in.  There is any type of drainage, redness, or swelling where the PICC enters the skin.  You cannot flush the PICC, it is difficult to flush, or the PICC leaks around the insertion site when it is flushed.  You hear a "flushing" sound when the PICC is flushed.  You have pain, discomfort, or numbness in your arm, shoulder, or jaw on the same side as the PICC.  You feel your heart "racing" or skipping beats.  You notice a hole or tear in the PICC.  You develop chills or a fever. MAKE SURE YOU:   Understand these instructions.  Will watch your condition.  Will get help right away if you are not doing well or get worse. Document Released: 10/20/2002 Document Revised: 08/30/2013 Document Reviewed: 12/21/2012 Exit[employer] Patient Information 2015 [employer]. This information is not intended to replace advice given to you by your health care provider. Make sure you discuss any questions you have with your health care provider.  

## 2013-12-01 NOTE — Progress Notes (Signed)
Faxed disability form to [phone]; put originals in registration desk

## 2013-12-03 ENCOUNTER — Telehealth: Payer: Self-pay | Admitting: Hematology and Oncology

## 2013-12-03 ENCOUNTER — Ambulatory Visit (HOSPITAL_BASED_OUTPATIENT_CLINIC_OR_DEPARTMENT_OTHER): Payer: Medicaid Other | Admitting: Hematology and Oncology

## 2013-12-03 ENCOUNTER — Encounter: Payer: Self-pay | Admitting: Hematology and Oncology

## 2013-12-03 ENCOUNTER — Ambulatory Visit (HOSPITAL_BASED_OUTPATIENT_CLINIC_OR_DEPARTMENT_OTHER): Payer: Medicaid Other

## 2013-12-03 ENCOUNTER — Telehealth: Payer: Self-pay | Admitting: *Deleted

## 2013-12-03 ENCOUNTER — Other Ambulatory Visit (HOSPITAL_BASED_OUTPATIENT_CLINIC_OR_DEPARTMENT_OTHER): Payer: Medicaid Other

## 2013-12-03 VITALS — BP 115/75 | HR 102 | Temp 98.7°F | Resp 17 | Ht 65.0 in | Wt 116.8 lb

## 2013-12-03 DIAGNOSIS — D473 Essential (hemorrhagic) thrombocythemia: Secondary | ICD-10-CM

## 2013-12-03 DIAGNOSIS — R600 Localized edema: Secondary | ICD-10-CM

## 2013-12-03 DIAGNOSIS — T148XXA Other injury of unspecified body region, initial encounter: Secondary | ICD-10-CM

## 2013-12-03 DIAGNOSIS — C01 Malignant neoplasm of base of tongue: Secondary | ICD-10-CM

## 2013-12-03 DIAGNOSIS — E43 Unspecified severe protein-calorie malnutrition: Secondary | ICD-10-CM

## 2013-12-03 DIAGNOSIS — Z452 Encounter for adjustment and management of vascular access device: Secondary | ICD-10-CM

## 2013-12-03 DIAGNOSIS — D630 Anemia in neoplastic disease: Secondary | ICD-10-CM

## 2013-12-03 DIAGNOSIS — D75839 Thrombocytosis, unspecified: Secondary | ICD-10-CM

## 2013-12-03 DIAGNOSIS — R609 Edema, unspecified: Secondary | ICD-10-CM

## 2013-12-03 DIAGNOSIS — D72829 Elevated white blood cell count, unspecified: Secondary | ICD-10-CM

## 2013-12-03 LAB — CBC WITH DIFFERENTIAL/PLATELET
BASO%: 0.3 % (ref 0.0–2.0)
Basophils Absolute: 0.1 10*3/uL (ref 0.0–0.1)
EOS ABS: 0.2 10*3/uL (ref 0.0–0.5)
EOS%: 0.7 % (ref 0.0–7.0)
HCT: 28.5 % — ABNORMAL LOW (ref 38.4–49.9)
HEMOGLOBIN: 9.1 g/dL — AB (ref 13.0–17.1)
LYMPH%: 8.9 % — AB (ref 14.0–49.0)
MCH: 33.2 pg (ref 27.2–33.4)
MCHC: 32.1 g/dL (ref 32.0–36.0)
MCV: 103.4 fL — AB (ref 79.3–98.0)
MONO#: 0.6 10*3/uL (ref 0.1–0.9)
MONO%: 1.8 % (ref 0.0–14.0)
NEUT%: 88.3 % — ABNORMAL HIGH (ref 39.0–75.0)
NEUTROS ABS: 31.5 10*3/uL — AB (ref 1.5–6.5)
PLATELETS: 673 10*3/uL — AB (ref 140–400)
RBC: 2.76 10*6/uL — ABNORMAL LOW (ref 4.20–5.82)
RDW: 20.6 % — ABNORMAL HIGH (ref 11.0–14.6)
WBC: 35.6 10*3/uL — ABNORMAL HIGH (ref 4.0–10.3)
lymph#: 3.2 10*3/uL (ref 0.9–3.3)

## 2013-12-03 LAB — COMPREHENSIVE METABOLIC PANEL (CC13)
ALT: 7 U/L (ref 0–55)
ANION GAP: 9 meq/L (ref 3–11)
AST: 9 U/L (ref 5–34)
Albumin: 2.9 g/dL — ABNORMAL LOW (ref 3.5–5.0)
Alkaline Phosphatase: 107 U/L (ref 40–150)
BUN: 8.1 mg/dL (ref 7.0–26.0)
CHLORIDE: 106 meq/L (ref 98–109)
CO2: 25 mEq/L (ref 22–29)
Calcium: 8.3 mg/dL — ABNORMAL LOW (ref 8.4–10.4)
Creatinine: 0.7 mg/dL (ref 0.7–1.3)
Glucose: 150 mg/dl — ABNORMAL HIGH (ref 70–140)
Potassium: 3.9 mEq/L (ref 3.5–5.1)
SODIUM: 140 meq/L (ref 136–145)
TOTAL PROTEIN: 5.7 g/dL — AB (ref 6.4–8.3)
Total Bilirubin: <0.2 mg/dL (ref 0.20–1.20)

## 2013-12-03 LAB — MAGNESIUM (CC13): Magnesium: 1.6 mg/dl (ref 1.5–2.5)

## 2013-12-03 LAB — HOLD TUBE, BLOOD BANK

## 2013-12-03 MED ORDER — HEPARIN SOD (PORK) LOCK FLUSH 100 UNIT/ML IV SOLN
500.0000 [IU] | Freq: Once | INTRAVENOUS | Status: AC
  Administered 2013-12-03: 250 [IU] via INTRAVENOUS
  Filled 2013-12-03: qty 5

## 2013-12-03 MED ORDER — SODIUM CHLORIDE 0.9 % IJ SOLN
10.0000 mL | INTRAMUSCULAR | Status: DC | PRN
  Administered 2013-12-03: 10 mL via INTRAVENOUS
  Filled 2013-12-03: qty 10

## 2013-12-03 NOTE — Telephone Encounter (Signed)
, °

## 2013-12-03 NOTE — Patient Instructions (Signed)
PICC Home Guide A peripherally inserted central catheter (PICC) is a long, thin, flexible tube that is inserted into a vein in the upper arm. It is a form of intravenous (IV) access. It is considered to be a "central" line because the tip of the PICC ends in a large vein in your chest. This large vein is called the superior vena cava (SVC). The PICC tip ends in the SVC because there is a lot of blood flow in the SVC. This allows medicines and IV fluids to be quickly distributed throughout the body. The PICC is inserted using a sterile technique by a specially trained nurse or physician. After the PICC is inserted, a chest X-ray exam is done to be sure it is in the correct place.  A PICC may be placed for different reasons, such as:  To give medicines and liquid nutrition that can only be given through a central line. Examples are:  Certain antibiotic treatments.  Chemotherapy.  Total parenteral nutrition (TPN).  To take frequent blood samples.  To give IV fluids and blood products.  If there is difficulty placing a peripheral intravenous (PIV) catheter. If taken care of properly, a PICC can remain in place for several months. A PICC can also allow a person to go home from the hospital early. Medicine and PICC care can be managed at home by a family member or home health care team. WHAT PROBLEMS CAN HAPPEN WHEN I HAVE A PICC? Problems with a PICC can occasionally occur. These may include the following:  A blood clot (thrombus) forming in or at the tip of the PICC. This can cause the PICC to become clogged. A clot-dissolving medicine called tissue plasminogen activator (tPA) can be given through the PICC to help break up the clot.  Inflammation of the vein (phlebitis) in which the PICC is placed. Signs of inflammation may include redness, pain at the insertion site, red streaks, or being able to feel a "cord" in the vein where the PICC is located.  Infection in the PICC or at the insertion  site. Signs of infection may include fever, chills, redness, swelling, or pus drainage from the PICC insertion site.  PICC movement (malposition). The PICC tip may move from its original position due to excessive physical activity, forceful coughing, sneezing, or vomiting.  A break or cut in the PICC. It is important to not use scissors near the PICC.  Nerve or tendon irritation or injury during PICC insertion. WHAT SHOULD I KEEP IN MIND ABOUT ACTIVITIES WHEN I HAVE A PICC?  You may bend your arm and move it freely. If your PICC is near or at the bend of your elbow, avoid activity with repeated motion at the elbow.  Rest at home for the remainder of the day following PICC line insertion.  Avoid lifting heavy objects as instructed by your health care provider.  Avoid using a crutch with the arm on the same side as your PICC. You may need to use a walker. WHAT SHOULD I KNOW ABOUT MY PICC DRESSING?  Keep your PICC bandage (dressing) clean and dry to prevent infection.  Ask your health care provider when you may shower. Ask your health care provider to teach you how to wrap the PICC when you do take a shower.  Change the PICC dressing as instructed by your health care provider.  Change your PICC dressing if it becomes loose or wet. WHAT SHOULD I KNOW ABOUT PICC CARE?  Check the PICC insertion site   daily for leakage, redness, swelling, or pain.  Do not take a bath, swim, or use hot tubs when you have a PICC. Cover PICC line with clear plastic wrap and tape to keep it dry while showering.  Flush the PICC as directed by your health care provider. Let your health care provider know right away if the PICC is difficult to flush or does not flush. Do not use force to flush the PICC.  Do not use a syringe that is less than 10 mL to flush the PICC.  Never pull or tug on the PICC.  Avoid blood pressure checks on the arm with the PICC.  Keep your PICC identification card with you at all  times.  Do not take the PICC out yourself. Only a trained clinical professional should remove the PICC. SEEK IMMEDIATE MEDICAL CARE IF:  Your PICC is accidentally pulled all the way out. If this happens, cover the insertion site with a bandage or gauze dressing. Do not throw the PICC away. Your health care provider will need to inspect it.  Your PICC was tugged or pulled and has partially come out. Do not  push the PICC back in.  There is any type of drainage, redness, or swelling where the PICC enters the skin.  You cannot flush the PICC, it is difficult to flush, or the PICC leaks around the insertion site when it is flushed.  You hear a "flushing" sound when the PICC is flushed.  You have pain, discomfort, or numbness in your arm, shoulder, or jaw on the same side as the PICC.  You feel your heart "racing" or skipping beats.  You notice a hole or tear in the PICC.  You develop chills or a fever. MAKE SURE YOU:   Understand these instructions.  Will watch your condition.  Will get help right away if you are not doing well or get worse. Document Released: 10/20/2002 Document Revised: 08/30/2013 Document Reviewed: 12/21/2012 Exit[employer] Patient Information 2015 [employer]. This information is not intended to replace advice given to you by your health care provider. Make sure you discuss any questions you have with your health care provider.  

## 2013-12-03 NOTE — Assessment & Plan Note (Signed)
Continue daily wound dressing. The skin surrounding the wound does not appear to be infected.

## 2013-12-03 NOTE — Progress Notes (Signed)
[facility] OFFICE PROGRESS NOTE  Patient Care Team: [name], MD as PCP - General (Hematology and Oncology) Brooks Sailors, RN as Oncology Nurse Navigator  SUMMARY OF ONCOLOGIC HISTORY: Oncology History   Cancer of base of tongue   Primary site: Pharynx - Oropharynx   Staging method: AJCC 7th Edition   Clinical: Stage IVB (T2, N3, M0) signed by [name], MD on 10/15/2013 10:25 AM   Summary: Stage IVB (T2, N3, M0)       Cancer of base of tongue   09/17/2013 Imaging CT scan of the neck showed large bilateral neck lymphadenopathy with associated tongue mass.   09/23/2013 Procedure Fine needle aspirate of the left neck mass came back positive for squamous cell carcinoma.   10/04/2013 Surgery Laryngoscopy showed a firm mass within the left tongue base extending past the midline and encompassing much of the tongue base.  There is extension of the mass into the vallecula and on to the lingual surface of the epiglottis.   10/04/2013 Pathology Results Vallecula and tongue biopsy confirmed squamous cell carcinoma.   10/15/2013 Imaging PET/CT scan showed no evidence of distant metastatic disease apart from just regional lymph node involvement.   10/25/2013 - 11/01/2013 Hospital Admission    10/27/2013 -  Chemotherapy He is started on induction chemo with cisplatin, taxotere, 5 FU   11/03/2013 - 11/07/2013 Hospital Admission     INTERVAL HISTORY: Please see below for problem oriented charting. He is seen prior to cycle 3 of chemotherapy. He feels well. The wound on his neck is still discharging possible. He still has some mild pain in the neck. He tolerated recent treatment well. He has not lost any weight.  REVIEW OF SYSTEMS:   Constitutional: Denies fevers, chills  Eyes: Denies blurriness of vision Ears, nose, mouth, throat, and face: Denies mucositis or sore throat Respiratory: Denies cough, dyspnea or wheezes Cardiovascular: Denies palpitation, chest discomfort. He has  persistent bilateral lower extremity swelling Gastrointestinal:  Denies nausea, heartburn or change in bowel habits Skin: Denies abnormal skin rashes Lymphatics: Denies new lymphadenopathy or easy bruising Neurological:Denies numbness, tingling or new weaknesses Behavioral/Psych: Mood is stable, no new changes  All other systems were reviewed with the patient and are negative.  I have reviewed the past medical history, past surgical history, social history and family history with the patient and they are unchanged from previous note.  ALLERGIES:  has No Known Allergies.  MEDICATIONS:  Current Outpatient Prescriptions  Medication Sig Dispense Refill  . acetaminophen (TYLENOL) 325 MG tablet Take 650 mg by mouth every 6 (six) hours as needed for mild pain or moderate pain.      Marland Kitchen albuterol (PROVENTIL) (2.5 MG/3ML) 0.083% nebulizer solution Take 3 mLs (2.5 mg total) by nebulization every 6 (six) hours as needed for wheezing or shortness of breath.  75 mL  12  . antiseptic oral rinse (BIOTENE) LIQD 15 mLs by Mouth Rinse route 2 (two) times daily.  1 Bottle  2  . chlorhexidine (PERIDEX) 0.12 % solution Rinse with 15 mls twice daily for 30 seconds. Use after breakfast and at bedtime. Spit out excess. Do not swallow.  480 mL  prn  . dexamethasone (DECADRON) 4 MG tablet Start the day before Taxotere. Take once the day after, then 2 times a day x 2 days. Repeat in 3 weeks  30 tablet  1  . feeding supplement, ENSURE COMPLETE, (ENSURE COMPLETE) LIQD Take 237 mLs by mouth 2 (two) times daily between  meals.      . lactulose (CHRONULAC) 10 GM/15ML solution Take 15 mLs (10 g total) by mouth 2 (two) times daily as needed for mild constipation.  240 mL  2  . magnesium oxide (MAG-OX) 400 (241.3 MG) MG tablet Take 1 tablet (400 mg total) by mouth daily.  60 tablet  1  . Morphine Sulfate (MORPHINE CONCENTRATE) 10 mg / 0.5 ml concentrated solution Take 0.25 mLs (5 mg total) by mouth every 2 (two) hours as needed  for severe pain.  60 mL  0  . pantoprazole (PROTONIX) 40 MG tablet Take 1 tablet (40 mg total) by mouth daily.  30 tablet  2  . polyethylene glycol (MIRALAX / GLYCOLAX) packet Take 17 g by mouth daily.  30 each  2  . senna-docusate (SENOKOT-S) 8.6-50 MG per tablet Take 2 tablets by mouth 2 (two) times daily.  120 tablet  0  . temazepam (RESTORIL) 15 MG capsule Take 1 capsule (15 mg total) by mouth at bedtime as needed for sleep.  30 capsule  0   No current facility-administered medications for this visit.    PHYSICAL EXAMINATION: ECOG PERFORMANCE STATUS: 1 - Symptomatic but completely ambulatory  Filed Vitals:   12/03/13 1150  BP: 115/75  Pulse: 102  Temp: 98.7 F (37.1 C)  Resp: 17   Filed Weights   12/03/13 1150  Weight: 116 lb 12.8 oz (52.98 kg)    GENERAL:alert, no distress and comfortable. He looks thin and cachectic SKIN: skin color, texture, turgor are normal, no rashes or significant lesions EYES: normal, Conjunctiva are pink and non-injected, sclera clear OROPHARYNX:no exudate, no erythema and lips, buccal mucosa, and tongue normal  NECK: supple. The neck mass is getting smaller. I removed a lot of pus from the wound and brought new dressing changes LYMPH:  no palpable lymphadenopathy in the cervical, axillary or inguinal LUNGS: clear to auscultation and percussion with normal breathing effort HEART: regular rate & rhythm and no murmurs and no lower extremity edema ABDOMEN:abdomen soft, non-tender and normal bowel sounds Musculoskeletal:no cyanosis of digits and no clubbing  NEURO: alert & oriented x 3 with fluent speech, no focal motor/sensory deficits  LABORATORY DATA:  I have reviewed the data as listed    Component Value Date/Time   NA 140 12/03/2013 1124   NA 133* 11/06/2013 0445   K 3.9 12/03/2013 1124   K 3.5* 11/06/2013 0445   CL 98 11/06/2013 0445   CO2 25 12/03/2013 1124   CO2 21 11/06/2013 0445   GLUCOSE 150* 12/03/2013 1124   GLUCOSE 99 11/06/2013 0445   BUN  8.1 12/03/2013 1124   BUN 11 11/06/2013 0445   CREATININE 0.7 12/03/2013 1124   CREATININE 0.63 11/06/2013 0445   CALCIUM 8.3* 12/03/2013 1124   CALCIUM 7.0* 11/06/2013 0445   PROT 5.7* 12/03/2013 1124   PROT 5.6* 11/03/2013 2220   ALBUMIN 2.9* 12/03/2013 1124   ALBUMIN 2.5* 11/03/2013 2220   AST 9 12/03/2013 1124   AST 7 11/03/2013 2220   ALT 7 12/03/2013 1124   ALT 9 11/03/2013 2220   ALKPHOS 107 12/03/2013 1124   ALKPHOS 53 11/03/2013 2220   BILITOT <0.20 12/03/2013 1124   BILITOT 0.6 11/03/2013 2220   GFRNONAA >90 11/06/2013 0445   GFRAA >90 11/06/2013 0445    No results found for this basename: SPEP, UPEP,  kappa and lambda light chains    Lab Results  Component Value Date   WBC 35.6* 12/03/2013   NEUTROABS 31.5*  12/03/2013   HGB 9.1* 12/03/2013   HCT 28.5* 12/03/2013   MCV 103.4* 12/03/2013   PLT 673* 12/03/2013      Chemistry      Component Value Date/Time   NA 140 12/03/2013 1124   NA 133* 11/06/2013 0445   K 3.9 12/03/2013 1124   K 3.5* 11/06/2013 0445   CL 98 11/06/2013 0445   CO2 25 12/03/2013 1124   CO2 21 11/06/2013 0445   BUN 8.1 12/03/2013 1124   BUN 11 11/06/2013 0445   CREATININE 0.7 12/03/2013 1124   CREATININE 0.63 11/06/2013 0445      Component Value Date/Time   CALCIUM 8.3* 12/03/2013 1124   CALCIUM 7.0* 11/06/2013 0445   ALKPHOS 107 12/03/2013 1124   ALKPHOS 53 11/03/2013 2220   AST 9 12/03/2013 1124   AST 7 11/03/2013 2220   ALT 7 12/03/2013 1124   ALT 9 11/03/2013 2220   BILITOT <0.20 12/03/2013 1124   BILITOT 0.6 11/03/2013 2220    ASSESSMENT & PLAN:  Cancer of base of tongue He has remarkable response to chemotherapy. Continue aggressive supportive care. The patient is encouraged.      Wound discharge Continue daily wound dressing. The skin surrounding the wound does not appear to be infected.      Thrombocytosis This is due to recent treatment. Monitor closely.    Protein-calorie malnutrition, severe He has not lost weight and the serum albumin is improving. I continue to encourage the  patient.  Leukocytosis, unspecified This is likely related to the recent Neulasta injection and necrotic tumor on his neck.       Bilateral leg edema This is due to immobility and low serum protein. I would rather not prescribe Lasix therapy. I recommend elastic compression hose and increased mobility.    Anemia in neoplastic disease Recent workup such as anemia chronic disease. He is not symptomatic. Recommend observation only. I would transfuse if hemoglobin dropped to less than 8 g.          I plan to restage him after 3 cycles of chemotherapy. Orders Placed This Encounter  Procedures  . NM PET Image Restag (PS) Skull Base To Thigh    Standing Status: Future     Number of Occurrences:      Standing Expiration Date: 02/02/2015    Order Specific Question:  Reason for Exam (SYMPTOM  OR DIAGNOSIS REQUIRED)    Answer:  base of tongue ca, assess response to Rx    Order Specific Question:  Preferred imaging location?    Answer:  [facility]   All questions were answered. The patient knows to call the clinic with any problems, questions or concerns. No barriers to learning was detected. I spent 30 minutes counseling the patient face to face. The total time spent in the appointment was 40 minutes and more than 50% was on counseling and review of test results     [facility], [place], MD 12/03/2013 12:33 PM

## 2013-12-03 NOTE — Assessment & Plan Note (Signed)
This is due to immobility and low serum protein. I would rather not prescribe Lasix therapy. I recommend elastic compression hose and increased mobility.

## 2013-12-03 NOTE — Telephone Encounter (Signed)
Per POF staff phone call scheduled appts. Advised schedulers 

## 2013-12-03 NOTE — Assessment & Plan Note (Signed)
He has not lost weight and the serum albumin is improving. I continue to encourage the patient.

## 2013-12-03 NOTE — Assessment & Plan Note (Signed)
Recent workup such as anemia chronic disease. He is not symptomatic. Recommend observation only. I would transfuse if hemoglobin dropped to less than 8 g.

## 2013-12-03 NOTE — Assessment & Plan Note (Signed)
He has remarkable response to chemotherapy. Continue aggressive supportive care. The patient is encouraged.

## 2013-12-03 NOTE — Assessment & Plan Note (Signed)
This is due to recent treatment. Monitor closely.

## 2013-12-03 NOTE — Assessment & Plan Note (Signed)
This is likely related to the recent Neulasta injection and necrotic tumor on his neck.

## 2013-12-04 NOTE — Telephone Encounter (Signed)
Pt wife called stating both of his feet are very swollen "big as footballs", red, skin is cracking, painful when walks. I urged to elevate feet above heart. I noted he has a 3:00 flush appt today, I would contact Dr. Alvy Bimler re: expressed concern to see if she wanted to see him.  She verbzlixed understanding.  Gayleen Orem, RN, BSN, [facility] Head & Neck Oncology Navigator [phone]

## 2013-12-06 ENCOUNTER — Telehealth: Payer: Self-pay | Admitting: *Deleted

## 2013-12-06 ENCOUNTER — Ambulatory Visit (HOSPITAL_BASED_OUTPATIENT_CLINIC_OR_DEPARTMENT_OTHER): Payer: Medicaid Other

## 2013-12-06 VITALS — BP 123/77 | HR 96 | Temp 97.8°F

## 2013-12-06 DIAGNOSIS — Z452 Encounter for adjustment and management of vascular access device: Secondary | ICD-10-CM

## 2013-12-06 DIAGNOSIS — C01 Malignant neoplasm of base of tongue: Secondary | ICD-10-CM

## 2013-12-06 MED ORDER — HEPARIN SOD (PORK) LOCK FLUSH 100 UNIT/ML IV SOLN
500.0000 [IU] | Freq: Once | INTRAVENOUS | Status: AC
  Administered 2013-12-06: 250 [IU] via INTRAVENOUS
  Filled 2013-12-06: qty 5

## 2013-12-06 MED ORDER — SODIUM CHLORIDE 0.9 % IJ SOLN
10.0000 mL | INTRAMUSCULAR | Status: DC | PRN
  Administered 2013-12-06: 10 mL via INTRAVENOUS
  Filled 2013-12-06: qty 10

## 2013-12-06 NOTE — Telephone Encounter (Signed)
Returned patient wife's VM, LVM, indicating disability forms were faxed to ins co on 8/5, available to be picked up at front desk of [facility].  Gayleen Orem, RN, BSN, [facility] Head & Neck Oncology Navigator [phone]

## 2013-12-06 NOTE — Patient Instructions (Signed)
PICC Home Guide A peripherally inserted central catheter (PICC) is a long, thin, flexible tube that is inserted into a vein in the upper arm. It is a form of intravenous (IV) access. It is considered to be a "central" line because the tip of the PICC ends in a large vein in your chest. This large vein is called the superior vena cava (SVC). The PICC tip ends in the SVC because there is a lot of blood flow in the SVC. This allows medicines and IV fluids to be quickly distributed throughout the body. The PICC is inserted using a sterile technique by a specially trained nurse or physician. After the PICC is inserted, a chest X-ray exam is done to be sure it is in the correct place.  A PICC may be placed for different reasons, such as:  To give medicines and liquid nutrition that can only be given through a central line. Examples are:  Certain antibiotic treatments.  Chemotherapy.  Total parenteral nutrition (TPN).  To take frequent blood samples.  To give IV fluids and blood products.  If there is difficulty placing a peripheral intravenous (PIV) catheter. If taken care of properly, a PICC can remain in place for several months. A PICC can also allow a person to go home from the hospital early. Medicine and PICC care can be managed at home by a family member or home health care team. WHAT PROBLEMS CAN HAPPEN WHEN I HAVE A PICC? Problems with a PICC can occasionally occur. These may include the following:  A blood clot (thrombus) forming in or at the tip of the PICC. This can cause the PICC to become clogged. A clot-dissolving medicine called tissue plasminogen activator (tPA) can be given through the PICC to help break up the clot.  Inflammation of the vein (phlebitis) in which the PICC is placed. Signs of inflammation may include redness, pain at the insertion site, red streaks, or being able to feel a "cord" in the vein where the PICC is located.  Infection in the PICC or at the insertion  site. Signs of infection may include fever, chills, redness, swelling, or pus drainage from the PICC insertion site.  PICC movement (malposition). The PICC tip may move from its original position due to excessive physical activity, forceful coughing, sneezing, or vomiting.  A break or cut in the PICC. It is important to not use scissors near the PICC.  Nerve or tendon irritation or injury during PICC insertion. WHAT SHOULD I KEEP IN MIND ABOUT ACTIVITIES WHEN I HAVE A PICC?  You may bend your arm and move it freely. If your PICC is near or at the bend of your elbow, avoid activity with repeated motion at the elbow.  Rest at home for the remainder of the day following PICC line insertion.  Avoid lifting heavy objects as instructed by your health care provider.  Avoid using a crutch with the arm on the same side as your PICC. You may need to use a walker. WHAT SHOULD I KNOW ABOUT MY PICC DRESSING?  Keep your PICC bandage (dressing) clean and dry to prevent infection.  Ask your health care provider when you may shower. Ask your health care provider to teach you how to wrap the PICC when you do take a shower.  Change the PICC dressing as instructed by your health care provider.  Change your PICC dressing if it becomes loose or wet. WHAT SHOULD I KNOW ABOUT PICC CARE?  Check the PICC insertion site   daily for leakage, redness, swelling, or pain.  Do not take a bath, swim, or use hot tubs when you have a PICC. Cover PICC line with clear plastic wrap and tape to keep it dry while showering.  Flush the PICC as directed by your health care provider. Let your health care provider know right away if the PICC is difficult to flush or does not flush. Do not use force to flush the PICC.  Do not use a syringe that is less than 10 mL to flush the PICC.  Never pull or tug on the PICC.  Avoid blood pressure checks on the arm with the PICC.  Keep your PICC identification card with you at all  times.  Do not take the PICC out yourself. Only a trained clinical professional should remove the PICC. SEEK IMMEDIATE MEDICAL CARE IF:  Your PICC is accidentally pulled all the way out. If this happens, cover the insertion site with a bandage or gauze dressing. Do not throw the PICC away. Your health care provider will need to inspect it.  Your PICC was tugged or pulled and has partially come out. Do not  push the PICC back in.  There is any type of drainage, redness, or swelling where the PICC enters the skin.  You cannot flush the PICC, it is difficult to flush, or the PICC leaks around the insertion site when it is flushed.  You hear a "flushing" sound when the PICC is flushed.  You have pain, discomfort, or numbness in your arm, shoulder, or jaw on the same side as the PICC.  You feel your heart "racing" or skipping beats.  You notice a hole or tear in the PICC.  You develop chills or a fever. MAKE SURE YOU:   Understand these instructions.  Will watch your condition.  Will get help right away if you are not doing well or get worse. Document Released: 10/20/2002 Document Revised: 08/30/2013 Document Reviewed: 12/21/2012 Exit[employer] Patient Information 2015 [employer]. This information is not intended to replace advice given to you by your health care provider. Make sure you discuss any questions you have with your health care provider.  

## 2013-12-08 ENCOUNTER — Other Ambulatory Visit: Payer: Self-pay | Admitting: *Deleted

## 2013-12-08 ENCOUNTER — Other Ambulatory Visit: Payer: Self-pay | Admitting: Hematology and Oncology

## 2013-12-08 ENCOUNTER — Telehealth: Payer: Self-pay | Admitting: *Deleted

## 2013-12-08 ENCOUNTER — Ambulatory Visit: Payer: Medicaid Other

## 2013-12-08 ENCOUNTER — Encounter: Payer: Self-pay | Admitting: *Deleted

## 2013-12-08 ENCOUNTER — Ambulatory Visit (HOSPITAL_BASED_OUTPATIENT_CLINIC_OR_DEPARTMENT_OTHER): Payer: Medicaid Other

## 2013-12-08 ENCOUNTER — Ambulatory Visit: Payer: Medicaid Other | Admitting: Nutrition

## 2013-12-08 VITALS — BP 151/80 | HR 93 | Temp 98.5°F

## 2013-12-08 DIAGNOSIS — C01 Malignant neoplasm of base of tongue: Secondary | ICD-10-CM

## 2013-12-08 DIAGNOSIS — Z5111 Encounter for antineoplastic chemotherapy: Secondary | ICD-10-CM

## 2013-12-08 DIAGNOSIS — D630 Anemia in neoplastic disease: Secondary | ICD-10-CM

## 2013-12-08 DIAGNOSIS — Z452 Encounter for adjustment and management of vascular access device: Secondary | ICD-10-CM

## 2013-12-08 LAB — CBC WITH DIFFERENTIAL/PLATELET
BASO%: 0.5 % (ref 0.0–2.0)
Basophils Absolute: 0.1 10*3/uL (ref 0.0–0.1)
EOS%: 2.2 % (ref 0.0–7.0)
Eosinophils Absolute: 0.5 10*3/uL (ref 0.0–0.5)
HEMATOCRIT: 31.3 % — AB (ref 38.4–49.9)
HGB: 10.2 g/dL — ABNORMAL LOW (ref 13.0–17.1)
LYMPH%: 12.8 % — AB (ref 14.0–49.0)
MCH: 33.6 pg — ABNORMAL HIGH (ref 27.2–33.4)
MCHC: 32.5 g/dL (ref 32.0–36.0)
MCV: 103.3 fL — AB (ref 79.3–98.0)
MONO#: 1 10*3/uL — AB (ref 0.1–0.9)
MONO%: 4.7 % (ref 0.0–14.0)
NEUT%: 79.8 % — AB (ref 39.0–75.0)
NEUTROS ABS: 17.6 10*3/uL — AB (ref 1.5–6.5)
PLATELETS: 651 10*3/uL — AB (ref 140–400)
RBC: 3.03 10*6/uL — AB (ref 4.20–5.82)
RDW: 20.8 % — ABNORMAL HIGH (ref 11.0–14.6)
WBC: 22.1 10*3/uL — AB (ref 4.0–10.3)
lymph#: 2.8 10*3/uL (ref 0.9–3.3)

## 2013-12-08 LAB — MAGNESIUM (CC13): Magnesium: 1.7 mg/dl (ref 1.5–2.5)

## 2013-12-08 LAB — COMPREHENSIVE METABOLIC PANEL (CC13)
ANION GAP: 12 meq/L — AB (ref 3–11)
AST: 12 U/L (ref 5–34)
Albumin: 3.2 g/dL — ABNORMAL LOW (ref 3.5–5.0)
Alkaline Phosphatase: 81 U/L (ref 40–150)
BUN: 11.8 mg/dL (ref 7.0–26.0)
CO2: 24 meq/L (ref 22–29)
CREATININE: 0.7 mg/dL (ref 0.7–1.3)
Calcium: 9.4 mg/dL (ref 8.4–10.4)
Chloride: 105 mEq/L (ref 98–109)
Glucose: 194 mg/dl — ABNORMAL HIGH (ref 70–140)
Potassium: 4.2 mEq/L (ref 3.5–5.1)
SODIUM: 141 meq/L (ref 136–145)
TOTAL PROTEIN: 6.1 g/dL — AB (ref 6.4–8.3)

## 2013-12-08 LAB — HOLD TUBE, BLOOD BANK

## 2013-12-08 MED ORDER — PALONOSETRON HCL INJECTION 0.25 MG/5ML
INTRAVENOUS | Status: AC
  Filled 2013-12-08: qty 5

## 2013-12-08 MED ORDER — HEPARIN SOD (PORK) LOCK FLUSH 100 UNIT/ML IV SOLN
250.0000 [IU] | Freq: Once | INTRAVENOUS | Status: DC | PRN
  Filled 2013-12-08: qty 5

## 2013-12-08 MED ORDER — SODIUM CHLORIDE 0.9 % IV SOLN
Freq: Once | INTRAVENOUS | Status: AC
  Administered 2013-12-08: 09:00:00 via INTRAVENOUS

## 2013-12-08 MED ORDER — POTASSIUM CHLORIDE 2 MEQ/ML IV SOLN
Freq: Once | INTRAVENOUS | Status: AC
  Administered 2013-12-08: 10:00:00 via INTRAVENOUS
  Filled 2013-12-08: qty 10

## 2013-12-08 MED ORDER — DOCETAXEL CHEMO INJECTION 160 MG/16ML
75.0000 mg/m2 | Freq: Once | INTRAVENOUS | Status: AC
  Administered 2013-12-08: 110 mg via INTRAVENOUS
  Filled 2013-12-08: qty 11

## 2013-12-08 MED ORDER — DEXAMETHASONE SODIUM PHOSPHATE 20 MG/5ML IJ SOLN
INTRAMUSCULAR | Status: AC
  Filled 2013-12-08: qty 5

## 2013-12-08 MED ORDER — SODIUM CHLORIDE 0.9 % IV SOLN
150.0000 mg | Freq: Once | INTRAVENOUS | Status: AC
  Administered 2013-12-08: 150 mg via INTRAVENOUS
  Filled 2013-12-08: qty 5

## 2013-12-08 MED ORDER — HEPARIN SOD (PORK) LOCK FLUSH 100 UNIT/ML IV SOLN
500.0000 [IU] | Freq: Once | INTRAVENOUS | Status: AC
  Administered 2013-12-08: 500 [IU] via INTRAVENOUS
  Filled 2013-12-08: qty 5

## 2013-12-08 MED ORDER — SODIUM CHLORIDE 0.9 % IV SOLN
562.5000 mg/m2/d | INTRAVENOUS | Status: DC
  Administered 2013-12-08: 4350 mg via INTRAVENOUS
  Filled 2013-12-08: qty 87

## 2013-12-08 MED ORDER — SODIUM CHLORIDE 0.9 % IJ SOLN
10.0000 mL | INTRAMUSCULAR | Status: DC | PRN
  Administered 2013-12-08: 10 mL via INTRAVENOUS
  Filled 2013-12-08: qty 10

## 2013-12-08 MED ORDER — SODIUM CHLORIDE 0.9 % IV SOLN
75.0000 mg/m2 | Freq: Once | INTRAVENOUS | Status: AC
  Administered 2013-12-08: 110 mg via INTRAVENOUS
  Filled 2013-12-08: qty 110

## 2013-12-08 MED ORDER — PALONOSETRON HCL INJECTION 0.25 MG/5ML
0.2500 mg | Freq: Once | INTRAVENOUS | Status: AC
  Administered 2013-12-08: 0.25 mg via INTRAVENOUS

## 2013-12-08 MED ORDER — SODIUM CHLORIDE 0.9 % IJ SOLN
10.0000 mL | INTRAMUSCULAR | Status: DC | PRN
  Filled 2013-12-08: qty 10

## 2013-12-08 MED ORDER — DEXAMETHASONE SODIUM PHOSPHATE 20 MG/5ML IJ SOLN
12.0000 mg | Freq: Once | INTRAMUSCULAR | Status: AC
  Administered 2013-12-08: 12 mg via INTRAVENOUS

## 2013-12-08 NOTE — Progress Notes (Signed)
Met with patient during chemo infusion to provide support and encouragement.  He reported that he is encouraged by the significant shrinkage of his neck mass, is looking forward to PET scan in mid-September and hopes for "good news".  He denied any needs at this time, I encouraged him to call me should that change.  Gayleen Orem, RN, BSN, [place] at [place] [phone]

## 2013-12-08 NOTE — Telephone Encounter (Signed)
Per staff phone call and POF I have schedueld appts. Scheduler advised of appts.  JMW  

## 2013-12-08 NOTE — Progress Notes (Signed)
Nutrition followup completed with patient in chemotherapy.  Weight documented as 116.8 pounds August 7.  Patient has been consuming Carnation breakfast essentials twice a day.  Weight is fluctuating.  Bilateral leg edema is improving.  Patient has no nutrition concerns.  Nutrition diagnosis: Unintended weight loss continues.  Intervention: Patient educated to continue oral diet with oral nutrition supplements as tolerated. Encouraged patient to use fortified milk when preparing Carnation breakfast essentials. Questions answered and teach back method used.  Monitoring, evaluation, goals: Patient is working to increase calories and protein, and has seen improvement in edema, which may account for weight loss.  Next visit: Not scheduled at this time.  Will continue to work with patient as needed.  He has my contact information.   **Disclaimer: This note was dictated with voice recognition software. Similar sounding words can inadvertently be transcribed and this note may contain transcription errors which may not have been corrected upon publication of note.**

## 2013-12-08 NOTE — Patient Instructions (Signed)
PICC Home Guide A peripherally inserted central catheter (PICC) is a long, thin, flexible tube that is inserted into a vein in the upper arm. It is a form of intravenous (IV) access. It is considered to be a "central" line because the tip of the PICC ends in a large vein in your chest. This large vein is called the superior vena cava (SVC). The PICC tip ends in the SVC because there is a lot of blood flow in the SVC. This allows medicines and IV fluids to be quickly distributed throughout the body. The PICC is inserted using a sterile technique by a specially trained nurse or physician. After the PICC is inserted, a chest X-ray exam is done to be sure it is in the correct place.  A PICC may be placed for different reasons, such as:  To give medicines and liquid nutrition that can only be given through a central line. Examples are:  Certain antibiotic treatments.  Chemotherapy.  Total parenteral nutrition (TPN).  To take frequent blood samples.  To give IV fluids and blood products.  If there is difficulty placing a peripheral intravenous (PIV) catheter. If taken care of properly, a PICC can remain in place for several months. A PICC can also allow a person to go home from the hospital early. Medicine and PICC care can be managed at home by a family member or home health care team. WHAT PROBLEMS CAN HAPPEN WHEN I HAVE A PICC? Problems with a PICC can occasionally occur. These may include the following:  A blood clot (thrombus) forming in or at the tip of the PICC. This can cause the PICC to become clogged. A clot-dissolving medicine called tissue plasminogen activator (tPA) can be given through the PICC to help break up the clot.  Inflammation of the vein (phlebitis) in which the PICC is placed. Signs of inflammation may include redness, pain at the insertion site, red streaks, or being able to feel a "cord" in the vein where the PICC is located.  Infection in the PICC or at the insertion  site. Signs of infection may include fever, chills, redness, swelling, or pus drainage from the PICC insertion site.  PICC movement (malposition). The PICC tip may move from its original position due to excessive physical activity, forceful coughing, sneezing, or vomiting.  A break or cut in the PICC. It is important to not use scissors near the PICC.  Nerve or tendon irritation or injury during PICC insertion. WHAT SHOULD I KEEP IN MIND ABOUT ACTIVITIES WHEN I HAVE A PICC?  You may bend your arm and move it freely. If your PICC is near or at the bend of your elbow, avoid activity with repeated motion at the elbow.  Rest at home for the remainder of the day following PICC line insertion.  Avoid lifting heavy objects as instructed by your health care provider.  Avoid using a crutch with the arm on the same side as your PICC. You may need to use a walker. WHAT SHOULD I KNOW ABOUT MY PICC DRESSING?  Keep your PICC bandage (dressing) clean and dry to prevent infection.  Ask your health care provider when you may shower. Ask your health care provider to teach you how to wrap the PICC when you do take a shower.  Change the PICC dressing as instructed by your health care provider.  Change your PICC dressing if it becomes loose or wet. WHAT SHOULD I KNOW ABOUT PICC CARE?  Check the PICC insertion site   daily for leakage, redness, swelling, or pain.  Do not take a bath, swim, or use hot tubs when you have a PICC. Cover PICC line with clear plastic wrap and tape to keep it dry while showering.  Flush the PICC as directed by your health care provider. Let your health care provider know right away if the PICC is difficult to flush or does not flush. Do not use force to flush the PICC.  Do not use a syringe that is less than 10 mL to flush the PICC.  Never pull or tug on the PICC.  Avoid blood pressure checks on the arm with the PICC.  Keep your PICC identification card with you at all  times.  Do not take the PICC out yourself. Only a trained clinical professional should remove the PICC. SEEK IMMEDIATE MEDICAL CARE IF:  Your PICC is accidentally pulled all the way out. If this happens, cover the insertion site with a bandage or gauze dressing. Do not throw the PICC away. Your health care provider will need to inspect it.  Your PICC was tugged or pulled and has partially come out. Do not  push the PICC back in.  There is any type of drainage, redness, or swelling where the PICC enters the skin.  You cannot flush the PICC, it is difficult to flush, or the PICC leaks around the insertion site when it is flushed.  You hear a "flushing" sound when the PICC is flushed.  You have pain, discomfort, or numbness in your arm, shoulder, or jaw on the same side as the PICC.  You feel your heart "racing" or skipping beats.  You notice a hole or tear in the PICC.  You develop chills or a fever. MAKE SURE YOU:   Understand these instructions.  Will watch your condition.  Will get help right away if you are not doing well or get worse. Document Released: 10/20/2002 Document Revised: 08/30/2013 Document Reviewed: 12/21/2012 Exit[employer] Patient Information 2015 [employer]. This information is not intended to replace advice given to you by your health care provider. Make sure you discuss any questions you have with your health care provider.  

## 2013-12-09 ENCOUNTER — Telehealth: Payer: Self-pay | Admitting: *Deleted

## 2013-12-09 ENCOUNTER — Ambulatory Visit: Payer: Self-pay

## 2013-12-09 ENCOUNTER — Encounter: Payer: Self-pay | Admitting: Nutrition

## 2013-12-09 NOTE — Telephone Encounter (Signed)
In follow-up to patient wife's call yesterday re: tomorrow's 12:45 appt.  Per my discussion with Infusion charge RN Kenney Houseman this morning, patient's PICC dsg is to be changed, IVF available for him if he needed per his report.   I called wife with this information.  Gayleen Orem, RN, BSN, [place] [facility] [place] [phone]

## 2013-12-10 ENCOUNTER — Other Ambulatory Visit: Payer: Self-pay | Admitting: Hematology and Oncology

## 2013-12-10 ENCOUNTER — Ambulatory Visit: Payer: Medicaid Other

## 2013-12-10 NOTE — Progress Notes (Signed)
Pt in for PICC dressing change/ IVFluids. Pt declined fluids, he and wife stated he is eating and drinking well. Would like to cancel IV fluids for today. Pt will return for office visit/ [facility] DC on 8/17.

## 2013-12-13 ENCOUNTER — Other Ambulatory Visit: Payer: Self-pay | Admitting: Hematology and Oncology

## 2013-12-13 ENCOUNTER — Ambulatory Visit (HOSPITAL_BASED_OUTPATIENT_CLINIC_OR_DEPARTMENT_OTHER): Payer: Medicaid Other | Admitting: Hematology and Oncology

## 2013-12-13 ENCOUNTER — Ambulatory Visit (HOSPITAL_BASED_OUTPATIENT_CLINIC_OR_DEPARTMENT_OTHER): Payer: Medicaid Other

## 2013-12-13 ENCOUNTER — Encounter: Payer: Self-pay | Admitting: Hematology and Oncology

## 2013-12-13 ENCOUNTER — Telehealth: Payer: Self-pay | Admitting: *Deleted

## 2013-12-13 ENCOUNTER — Ambulatory Visit: Payer: Self-pay

## 2013-12-13 ENCOUNTER — Telehealth: Payer: Self-pay | Admitting: Hematology and Oncology

## 2013-12-13 VITALS — BP 118/70 | HR 89 | Temp 97.8°F | Resp 18 | Ht 65.0 in | Wt 116.8 lb

## 2013-12-13 VITALS — BP 123/72 | HR 79 | Resp 16

## 2013-12-13 DIAGNOSIS — C01 Malignant neoplasm of base of tongue: Secondary | ICD-10-CM

## 2013-12-13 DIAGNOSIS — R634 Abnormal weight loss: Secondary | ICD-10-CM

## 2013-12-13 DIAGNOSIS — M542 Cervicalgia: Secondary | ICD-10-CM

## 2013-12-13 DIAGNOSIS — R609 Edema, unspecified: Secondary | ICD-10-CM

## 2013-12-13 DIAGNOSIS — Z5189 Encounter for other specified aftercare: Secondary | ICD-10-CM

## 2013-12-13 DIAGNOSIS — T148XXA Other injury of unspecified body region, initial encounter: Secondary | ICD-10-CM

## 2013-12-13 DIAGNOSIS — R600 Localized edema: Secondary | ICD-10-CM

## 2013-12-13 MED ORDER — SODIUM CHLORIDE 0.9 % IJ SOLN
10.0000 mL | INTRAMUSCULAR | Status: DC | PRN
  Administered 2013-12-13: 10 mL
  Filled 2013-12-13: qty 10

## 2013-12-13 MED ORDER — SODIUM CHLORIDE 0.9 % IV SOLN
1000.0000 mL | Freq: Once | INTRAVENOUS | Status: AC
  Administered 2013-12-13: 13:00:00 via INTRAVENOUS

## 2013-12-13 MED ORDER — PEGFILGRASTIM INJECTION 6 MG/0.6ML
6.0000 mg | Freq: Once | SUBCUTANEOUS | Status: AC
  Administered 2013-12-13: 6 mg via SUBCUTANEOUS
  Filled 2013-12-13: qty 0.6

## 2013-12-13 MED ORDER — HEPARIN SOD (PORK) LOCK FLUSH 100 UNIT/ML IV SOLN
250.0000 [IU] | Freq: Once | INTRAVENOUS | Status: AC | PRN
  Administered 2013-12-13: 250 [IU]
  Filled 2013-12-13: qty 5

## 2013-12-13 MED ORDER — HEPARIN SOD (PORK) LOCK FLUSH 100 UNIT/ML IV SOLN
250.0000 [IU] | Freq: Once | INTRAVENOUS | Status: AC | PRN
  Administered 2013-12-13: 15:00:00
  Filled 2013-12-13: qty 5

## 2013-12-13 MED ORDER — MORPHINE SULFATE ER 15 MG PO TBCR: 15.0000 mg | EXTENDED_RELEASE_TABLET | Freq: Two times a day (BID) | ORAL | Status: DC

## 2013-12-13 NOTE — Telephone Encounter (Signed)
Pt confirmed MD visit per 08/17 POF, gave pt AVS...Marland KitchenMarland KitchenKJ

## 2013-12-13 NOTE — Assessment & Plan Note (Signed)
Continue daily wound dressing. The skin surrounding the wound does not appear to be infected.

## 2013-12-13 NOTE — Assessment & Plan Note (Signed)
He has remarkable response to chemotherapy. Continue aggressive supportive care. The patient is encouraged.

## 2013-12-13 NOTE — Assessment & Plan Note (Signed)
This is severe. I was saw him on MS Contin for pain control. We discussed narcotics refill policy.

## 2013-12-13 NOTE — Assessment & Plan Note (Signed)
He has not lost weight and the serum albumin is improving. I continue to encourage the patient.

## 2013-12-13 NOTE — Patient Instructions (Addendum)
Dehydration, Adult Dehydration is when you lose more fluids from the body than you take in. Vital organs like the kidneys, brain, and heart cannot function without a proper amount of fluids and salt. Any loss of fluids from the body can cause dehydration.  CAUSES   Vomiting.  Diarrhea.  Excessive sweating.  Excessive urine output.  Fever. SYMPTOMS  Mild dehydration  Thirst.  Dry lips.  Slightly dry mouth. Moderate dehydration  Very dry mouth.  Sunken eyes.  Skin does not bounce back quickly when lightly pinched and released.  Dark urine and decreased urine production.  Decreased tear production.  Headache. Severe dehydration  Very dry mouth.  Extreme thirst.  Rapid, weak pulse (more than 100 beats per minute at rest).  Cold hands and feet.  Not able to sweat in spite of heat and temperature.  Rapid breathing.  Blue lips.  Confusion and lethargy.  Difficulty being awakened.  Minimal urine production.  No tears. DIAGNOSIS  Your caregiver will diagnose dehydration based on your symptoms and your exam. Blood and urine tests will help confirm the diagnosis. The diagnostic evaluation should also identify the cause of dehydration. TREATMENT  Treatment of mild or moderate dehydration can often be done at home by increasing the amount of fluids that you drink. It is best to drink small amounts of fluid more often. Drinking too much at one time can make vomiting worse. Refer to the home care instructions below. Severe dehydration needs to be treated at the hospital where you will probably be given intravenous (IV) fluids that contain water and electrolytes. HOME CARE INSTRUCTIONS   Ask your caregiver about specific rehydration instructions.  Drink enough fluids to keep your urine clear or pale yellow.  Drink small amounts frequently if you have nausea and vomiting.  Eat as you normally do.  Avoid:  Foods or drinks high in sugar.  Carbonated  drinks.  Juice.  Extremely hot or cold fluids.  Drinks with caffeine.  Fatty, greasy foods.  Alcohol.  Tobacco.  Overeating.  Gelatin desserts.  Wash your hands well to avoid spreading bacteria and viruses.  Only take over-the-counter or prescription medicines for pain, discomfort, or fever as directed by your caregiver.  Ask your caregiver if you should continue all prescribed and over-the-counter medicines.  Keep all follow-up appointments with your caregiver. SEEK MEDICAL CARE IF:  You have abdominal pain and it increases or stays in one area (localizes).  You have a rash, stiff neck, or severe headache.  You are irritable, sleepy, or difficult to awaken.  You are weak, dizzy, or extremely thirsty. SEEK IMMEDIATE MEDICAL CARE IF:   You are unable to keep fluids down or you get worse despite treatment.  You have frequent episodes of vomiting or diarrhea.  You have blood or green matter (bile) in your vomit.  You have blood in your stool or your stool looks black and tarry.  You have not urinated in 6 to 8 hours, or you have only urinated a small amount of very dark urine.  You have a fever.  You faint. MAKE SURE YOU:   Understand these instructions.  Will watch your condition.  Will get help right away if you are not doing well or get worse. Document Released: 04/15/2005 Document Revised: 07/08/2011 Document Reviewed: 12/03/2010 ExitCare Patient Information 2015 [employer]. This information is not intended to replace advice given to you by your health care provider. Make sure you discuss any questions you have with your health care   provider. Pegfilgrastim injection What is this medicine? PEGFILGRASTIM (peg fil GRA stim) is a long-acting granulocyte colony-stimulating factor that stimulates the growth of neutrophils, a type of white blood cell important in the body's fight against infection. It is used to reduce the incidence of fever and infection in  patients with certain types of cancer who are receiving chemotherapy that affects the bone marrow. This medicine may be used for other purposes; ask your health care provider or pharmacist if you have questions. COMMON BRAND NAME(S): Neulasta What should I tell my health care provider before I take this medicine? They need to know if you have any of these conditions: -latex allergy -ongoing radiation therapy -sickle cell disease -skin reactions to acrylic adhesives (On-Body Injector only) -an unusual or allergic reaction to pegfilgrastim, filgrastim, other medicines, foods, dyes, or preservatives -pregnant or trying to get pregnant -breast-feeding How should I use this medicine? This medicine is for injection under the skin. If you get this medicine at home, you will be taught how to prepare and give the pre-filled syringe or how to use the On-body Injector. Refer to the patient Instructions for Use for detailed instructions. Use exactly as directed. Take your medicine at regular intervals. Do not take your medicine more often than directed. It is important that you put your used needles and syringes in a special sharps container. Do not put them in a trash can. If you do not have a sharps container, call your pharmacist or healthcare provider to get one. Talk to your pediatrician regarding the use of this medicine in children. Special care may be needed. Overdosage: If you think you have taken too much of this medicine contact a poison control center or emergency room at once. NOTE: This medicine is only for you. Do not share this medicine with others. What if I miss a dose? It is important not to miss your dose. Call your doctor or health care professional if you miss your dose. If you miss a dose due to an On-body Injector failure or leakage, a new dose should be administered as soon as possible using a single prefilled syringe for manual use. What may interact with this  medicine? Interactions have not been studied. Give your health care provider a list of all the medicines, herbs, non-prescription drugs, or dietary supplements you use. Also tell them if you smoke, drink alcohol, or use illegal drugs. Some items may interact with your medicine. This list may not describe all possible interactions. Give your health care provider a list of all the medicines, herbs, non-prescription drugs, or dietary supplements you use. Also tell them if you smoke, drink alcohol, or use illegal drugs. Some items may interact with your medicine. What should I watch for while using this medicine? You may need blood work done while you are taking this medicine. If you are going to need a MRI, CT scan, or other procedure, tell your doctor that you are using this medicine (On-Body Injector only). What side effects may I notice from receiving this medicine? Side effects that you should report to your doctor or health care professional as soon as possible: -allergic reactions like skin rash, itching or hives, swelling of the face, lips, or tongue -dizziness -fever -pain, redness, or irritation at site where injected -pinpoint red spots on the skin -shortness of breath or breathing problems -stomach or side pain, or pain at the shoulder -swelling -tiredness -trouble passing urine Side effects that usually do not require medical attention (report to   your doctor or health care professional if they continue or are bothersome): -bone pain -muscle pain This list may not describe all possible side effects. Call your doctor for medical advice about side effects. You may report side effects to FDA at 1-800-FDA-1088. Where should I keep my medicine? Keep out of the reach of children. Store pre-filled syringes in a refrigerator between 2 and 8 degrees C (36 and 46 degrees F). Do not freeze. Keep in carton to protect from light. Throw away this medicine if it is left out of the refrigerator for  more than 48 hours. Throw away any unused medicine after the expiration date. NOTE: This sheet is a summary. It may not cover all possible information. If you have questions about this medicine, talk to your doctor, pharmacist, or health care provider.  2015, Elsevier/[employer] Standard. (2013-07-15 16:14:05)  

## 2013-12-13 NOTE — Telephone Encounter (Signed)
Per staff message and POF I have scheduled appts. Advised scheduler of appts. JMW  

## 2013-12-13 NOTE — Progress Notes (Signed)
[facility] OFFICE PROGRESS NOTE  Patient Care Team: [name], MD as PCP - General (Hematology and Oncology) Brooks Sailors, RN as Oncology Nurse Navigator  SUMMARY OF ONCOLOGIC HISTORY: Oncology History   Cancer of base of tongue   Primary site: Pharynx - Oropharynx   Staging method: AJCC 7th Edition   Clinical: Stage IVB (T2, N3, M0) signed by [name], MD on 10/15/2013 10:25 AM   Summary: Stage IVB (T2, N3, M0)       Cancer of base of tongue   09/17/2013 Imaging CT scan of the neck showed large bilateral neck lymphadenopathy with associated tongue mass.   09/23/2013 Procedure Fine needle aspirate of the left neck mass came back positive for squamous cell carcinoma.   10/04/2013 Surgery Laryngoscopy showed a firm mass within the left tongue base extending past the midline and encompassing much of the tongue base.  There is extension of the mass into the vallecula and on to the lingual surface of the epiglottis.   10/04/2013 Pathology Results Vallecula and tongue biopsy confirmed squamous cell carcinoma.   10/15/2013 Imaging PET/CT scan showed no evidence of distant metastatic disease apart from just regional lymph node involvement.   10/25/2013 - 11/01/2013 Hospital Admission    10/27/2013 -  Chemotherapy He is started on induction chemo with cisplatin, taxotere, 5 FU   11/03/2013 - 11/07/2013 Hospital Admission     INTERVAL HISTORY: Please see below for problem oriented charting. He is seen after cycle 3 of treatment for supportive care. He complained of severe neck pain after recent treatment. He continued to have significant wound discharge. Denies fevers or chills. The leg swelling has improved.  REVIEW OF SYSTEMS:   Constitutional: Denies fevers, chills or abnormal weight loss Eyes: Denies blurriness of vision Ears, nose, mouth, throat, and face: Denies mucositis or sore throat Respiratory: Denies cough, dyspnea or wheezes Cardiovascular: Denies palpitation,  chest discomfort Gastrointestinal:  Denies nausea, heartburn or change in bowel habits Skin: Denies abnormal skin rashes Lymphatics: Denies new lymphadenopathy or easy bruising Neurological:Denies numbness, tingling or new weaknesses Behavioral/Psych: Mood is stable, no new changes  All other systems were reviewed with the patient and are negative.  I have reviewed the past medical history, past surgical history, social history and family history with the patient and they are unchanged from previous note.  ALLERGIES:  has No Known Allergies.  MEDICATIONS:  Current Outpatient Prescriptions  Medication Sig Dispense Refill  . acetaminophen (TYLENOL) 325 MG tablet Take 650 mg by mouth every 6 (six) hours as needed for mild pain or moderate pain.      Marland Kitchen albuterol (PROVENTIL) (2.5 MG/3ML) 0.083% nebulizer solution Take 3 mLs (2.5 mg total) by nebulization every 6 (six) hours as needed for wheezing or shortness of breath.  75 mL  12  . antiseptic oral rinse (BIOTENE) LIQD 15 mLs by Mouth Rinse route 2 (two) times daily.  1 Bottle  2  . chlorhexidine (PERIDEX) 0.12 % solution Rinse with 15 mls twice daily for 30 seconds. Use after breakfast and at bedtime. Spit out excess. Do not swallow.  480 mL  prn  . dexamethasone (DECADRON) 4 MG tablet Start the day before Taxotere. Take once the day after, then 2 times a day x 2 days. Repeat in 3 weeks  30 tablet  1  . feeding supplement, ENSURE COMPLETE, (ENSURE COMPLETE) LIQD Take 237 mLs by mouth 2 (two) times daily between meals.      . lactulose ([place])  10 GM/15ML solution Take 15 mLs (10 g total) by mouth 2 (two) times daily as needed for mild constipation.  240 mL  2  . magnesium oxide (MAG-OX) 400 (241.3 MG) MG tablet Take 1 tablet (400 mg total) by mouth daily.  60 tablet  1  . Morphine Sulfate (MORPHINE CONCENTRATE) 10 mg / 0.5 ml concentrated solution Take 0.25 mLs (5 mg total) by mouth every 2 (two) hours as needed for severe pain.  60 mL  0  .  pantoprazole (PROTONIX) 40 MG tablet Take 1 tablet (40 mg total) by mouth daily.  30 tablet  2  . polyethylene glycol (MIRALAX / GLYCOLAX) packet Take 17 g by mouth daily.  30 each  2  . senna-docusate (SENOKOT-S) 8.6-50 MG per tablet Take 2 tablets by mouth 2 (two) times daily.  120 tablet  0  . temazepam (RESTORIL) 15 MG capsule TAKE 1 CAPSULE BY MOUTH AT BEDTIME AS NEEDED FOR SLEEP  30 capsule  0  . morphine (MS CONTIN) 15 MG 12 hr tablet Take 1 tablet (15 mg total) by mouth every 12 (twelve) hours.  60 tablet  0   No current facility-administered medications for this visit.    PHYSICAL EXAMINATION: ECOG PERFORMANCE STATUS: 1 - Symptomatic but completely ambulatory  Filed Vitals:   12/13/13 1141  BP: 118/70  Pulse: 89  Temp: 97.8 F (36.6 C)  Resp: 18   Filed Weights   12/13/13 1141  Weight: 116 lb 12.8 oz (52.98 kg)    GENERAL:alert, no distress and comfortable. He looks thin and cachectic SKIN: skin color, texture, turgor are normal, no rashes or significant lesions EYES: normal, Conjunctiva are pink and non-injected, sclera clear OROPHARYNX:no exudate, no erythema and lips, buccal mucosa, and tongue normal  NECK: I removed another cup of pus from his left neck from his left neck wound. A new bandage has been applied  LUNGS: clear to auscultation and percussion with normal breathing effort HEART: regular rate & rhythm and no murmurs and no lower extremity edema ABDOMEN:abdomen soft, non-tender and normal bowel sounds Musculoskeletal:no cyanosis of digits and no clubbing  NEURO: alert & oriented x 3 with fluent speech, no focal motor/sensory deficits  LABORATORY DATA:  I have reviewed the data as listed    Component Value Date/Time   NA 141 12/08/2013 0805   NA 133* 11/06/2013 0445   K 4.2 12/08/2013 0805   K 3.5* 11/06/2013 0445   CL 98 11/06/2013 0445   CO2 24 12/08/2013 0805   CO2 21 11/06/2013 0445   GLUCOSE 194* 12/08/2013 0805   GLUCOSE 99 11/06/2013 0445   BUN 11.8  12/08/2013 0805   BUN 11 11/06/2013 0445   CREATININE 0.7 12/08/2013 0805   CREATININE 0.63 11/06/2013 0445   CALCIUM 9.4 12/08/2013 0805   CALCIUM 7.0* 11/06/2013 0445   PROT 6.1* 12/08/2013 0805   PROT 5.6* 11/03/2013 2220   ALBUMIN 3.2* 12/08/2013 0805   ALBUMIN 2.5* 11/03/2013 2220   AST 12 12/08/2013 0805   AST 7 11/03/2013 2220   ALT <6 12/08/2013 0805   ALT 9 11/03/2013 2220   ALKPHOS 81 12/08/2013 0805   ALKPHOS 53 11/03/2013 2220   BILITOT <0.20 12/08/2013 0805   BILITOT 0.6 11/03/2013 2220   GFRNONAA >90 11/06/2013 0445   GFRAA >90 11/06/2013 0445    No results found for this basename: SPEP, UPEP,  kappa and lambda light chains    Lab Results  Component Value Date   WBC 22.1*  12/08/2013   NEUTROABS 17.6* 12/08/2013   HGB 10.2* 12/08/2013   HCT 31.3* 12/08/2013   MCV 103.3* 12/08/2013   PLT 651* 12/08/2013      Chemistry      Component Value Date/Time   NA 141 12/08/2013 0805   NA 133* 11/06/2013 0445   K 4.2 12/08/2013 0805   K 3.5* 11/06/2013 0445   CL 98 11/06/2013 0445   CO2 24 12/08/2013 0805   CO2 21 11/06/2013 0445   BUN 11.8 12/08/2013 0805   BUN 11 11/06/2013 0445   CREATININE 0.7 12/08/2013 0805   CREATININE 0.63 11/06/2013 0445      Component Value Date/Time   CALCIUM 9.4 12/08/2013 0805   CALCIUM 7.0* 11/06/2013 0445   ALKPHOS 81 12/08/2013 0805   ALKPHOS 53 11/03/2013 2220   AST 12 12/08/2013 0805   AST 7 11/03/2013 2220   ALT <6 12/08/2013 0805   ALT 9 11/03/2013 2220   BILITOT <0.20 12/08/2013 0805   BILITOT 0.6 11/03/2013 2220      ASSESSMENT & PLAN:  Cancer of base of tongue He has remarkable response to chemotherapy. Continue aggressive supportive care. The patient is encouraged.        Wound discharge Continue daily wound dressing. The skin surrounding the wound does not appear to be infected.        Weight loss He has not lost weight and the serum albumin is improving. I continue to encourage the patient.    Neck pain This is severe. I was saw him on MS  Contin for pain control. We discussed narcotics refill policy.  Bilateral leg edema This has almost resolved. Continue to encourage ambulation.   No orders of the defined types were placed in this encounter.   All questions were answered. The patient knows to call the clinic with any problems, questions or concerns. No barriers to learning was detected. I spent 25 minutes counseling the patient face to face. The total time spent in the appointment was 30 minutes and more than 50% was on counseling and review of test results     [facility], [place], MD 12/13/2013 8:11 PM

## 2013-12-13 NOTE — Assessment & Plan Note (Signed)
This has almost resolved. Continue to encourage ambulation.

## 2013-12-14 ENCOUNTER — Ambulatory Visit (HOSPITAL_BASED_OUTPATIENT_CLINIC_OR_DEPARTMENT_OTHER): Payer: Medicaid Other

## 2013-12-14 ENCOUNTER — Ambulatory Visit: Payer: Self-pay

## 2013-12-14 VITALS — BP 118/70 | HR 80 | Temp 98.2°F | Resp 18

## 2013-12-14 DIAGNOSIS — E43 Unspecified severe protein-calorie malnutrition: Secondary | ICD-10-CM

## 2013-12-14 DIAGNOSIS — R634 Abnormal weight loss: Secondary | ICD-10-CM

## 2013-12-14 DIAGNOSIS — C01 Malignant neoplasm of base of tongue: Secondary | ICD-10-CM

## 2013-12-14 MED ORDER — HEPARIN SOD (PORK) LOCK FLUSH 100 UNIT/ML IV SOLN
250.0000 [IU] | Freq: Once | INTRAVENOUS | Status: AC | PRN
Start: 2013-12-14 — End: 2013-12-14
  Administered 2013-12-14: 250 [IU]
  Filled 2013-12-14: qty 5

## 2013-12-14 MED ORDER — SODIUM CHLORIDE 0.9 % IV SOLN
1000.0000 mL | Freq: Once | INTRAVENOUS | Status: AC
  Administered 2013-12-14: 1000 mL via INTRAVENOUS

## 2013-12-14 MED ORDER — SODIUM CHLORIDE 0.9 % IJ SOLN
10.0000 mL | INTRAMUSCULAR | Status: DC | PRN
  Administered 2013-12-14: 10 mL
  Filled 2013-12-14: qty 10

## 2013-12-14 NOTE — Patient Instructions (Signed)
Dehydration, Adult Dehydration is when you lose more fluids from the body than you take in. Vital organs like the kidneys, brain, and heart cannot function without a proper amount of fluids and salt. Any loss of fluids from the body can cause dehydration.  CAUSES   Vomiting.  Diarrhea.  Excessive sweating.  Excessive urine output.  Fever. SYMPTOMS  Mild dehydration  Thirst.  Dry lips.  Slightly dry mouth. Moderate dehydration  Very dry mouth.  Sunken eyes.  Skin does not bounce back quickly when lightly pinched and released.  Dark urine and decreased urine production.  Decreased tear production.  Headache. Severe dehydration  Very dry mouth.  Extreme thirst.  Rapid, weak pulse (more than 100 beats per minute at rest).  Cold hands and feet.  Not able to sweat in spite of heat and temperature.  Rapid breathing.  Blue lips.  Confusion and lethargy.  Difficulty being awakened.  Minimal urine production.  No tears. DIAGNOSIS  Your caregiver will diagnose dehydration based on your symptoms and your exam. Blood and urine tests will help confirm the diagnosis. The diagnostic evaluation should also identify the cause of dehydration. TREATMENT  Treatment of mild or moderate dehydration can often be done at home by increasing the amount of fluids that you drink. It is best to drink small amounts of fluid more often. Drinking too much at one time can make vomiting worse. Refer to the home care instructions below. Severe dehydration needs to be treated at the hospital where you will probably be given intravenous (IV) fluids that contain water and electrolytes. HOME CARE INSTRUCTIONS   Ask your caregiver about specific rehydration instructions.  Drink enough fluids to keep your urine clear or pale yellow.  Drink small amounts frequently if you have nausea and vomiting.  Eat as you normally do.  Avoid:  Foods or drinks high in sugar.  Carbonated  drinks.  Juice.  Extremely hot or cold fluids.  Drinks with caffeine.  Fatty, greasy foods.  Alcohol.  Tobacco.  Overeating.  Gelatin desserts.  Wash your hands well to avoid spreading bacteria and viruses.  Only take over-the-counter or prescription medicines for pain, discomfort, or fever as directed by your caregiver.  Ask your caregiver if you should continue all prescribed and over-the-counter medicines.  Keep all follow-up appointments with your caregiver. SEEK MEDICAL CARE IF:  You have abdominal pain and it increases or stays in one area (localizes).  You have a rash, stiff neck, or severe headache.  You are irritable, sleepy, or difficult to awaken.  You are weak, dizzy, or extremely thirsty. SEEK IMMEDIATE MEDICAL CARE IF:   You are unable to keep fluids down or you get worse despite treatment.  You have frequent episodes of vomiting or diarrhea.  You have blood or green matter (bile) in your vomit.  You have blood in your stool or your stool looks black and tarry.  You have not urinated in 6 to 8 hours, or you have only urinated a small amount of very dark urine.  You have a fever.  You faint. MAKE SURE YOU:   Understand these instructions.  Will watch your condition.  Will get help right away if you are not doing well or get worse. Document Released: 04/15/2005 Document Revised: 07/08/2011 Document Reviewed: 12/03/2010 ExitCare Patient Information 2015 [employer]. This information is not intended to replace advice given to you by your health care provider. Make sure you discuss any questions you have with your health care   provider.  

## 2013-12-15 ENCOUNTER — Ambulatory Visit (HOSPITAL_BASED_OUTPATIENT_CLINIC_OR_DEPARTMENT_OTHER): Payer: Medicaid Other

## 2013-12-15 VITALS — BP 128/70 | HR 83 | Temp 98.2°F | Resp 18

## 2013-12-15 DIAGNOSIS — R634 Abnormal weight loss: Secondary | ICD-10-CM

## 2013-12-15 DIAGNOSIS — E43 Unspecified severe protein-calorie malnutrition: Secondary | ICD-10-CM

## 2013-12-15 DIAGNOSIS — C01 Malignant neoplasm of base of tongue: Secondary | ICD-10-CM

## 2013-12-15 MED ORDER — SODIUM CHLORIDE 0.9 % IV SOLN
Freq: Once | INTRAVENOUS | Status: AC
  Administered 2013-12-15: 15:00:00 via INTRAVENOUS

## 2013-12-15 MED ORDER — HEPARIN SOD (PORK) LOCK FLUSH 100 UNIT/ML IV SOLN
250.0000 [IU] | Freq: Once | INTRAVENOUS | Status: AC | PRN
  Administered 2013-12-15: 250 [IU]
  Filled 2013-12-15: qty 5

## 2013-12-15 MED ORDER — SODIUM CHLORIDE 0.9 % IJ SOLN
10.0000 mL | INTRAMUSCULAR | Status: DC | PRN
  Administered 2013-12-15: 10 mL
  Filled 2013-12-15: qty 10

## 2013-12-15 NOTE — Patient Instructions (Signed)
Dehydration, Adult Dehydration is when you lose more fluids from the body than you take in. Vital organs like the kidneys, brain, and heart cannot function without a proper amount of fluids and salt. Any loss of fluids from the body can cause dehydration.  CAUSES   Vomiting.  Diarrhea.  Excessive sweating.  Excessive urine output.  Fever. SYMPTOMS  Mild dehydration  Thirst.  Dry lips.  Slightly dry mouth. Moderate dehydration  Very dry mouth.  Sunken eyes.  Skin does not bounce back quickly when lightly pinched and released.  Dark urine and decreased urine production.  Decreased tear production.  Headache. Severe dehydration  Very dry mouth.  Extreme thirst.  Rapid, weak pulse (more than 100 beats per minute at rest).  Cold hands and feet.  Not able to sweat in spite of heat and temperature.  Rapid breathing.  Blue lips.  Confusion and lethargy.  Difficulty being awakened.  Minimal urine production.  No tears. DIAGNOSIS  Your caregiver will diagnose dehydration based on your symptoms and your exam. Blood and urine tests will help confirm the diagnosis. The diagnostic evaluation should also identify the cause of dehydration. TREATMENT  Treatment of mild or moderate dehydration can often be done at home by increasing the amount of fluids that you drink. It is best to drink small amounts of fluid more often. Drinking too much at one time can make vomiting worse. Refer to the home care instructions below. Severe dehydration needs to be treated at the hospital where you will probably be given intravenous (IV) fluids that contain water and electrolytes. HOME CARE INSTRUCTIONS   Ask your caregiver about specific rehydration instructions.  Drink enough fluids to keep your urine clear or pale yellow.  Drink small amounts frequently if you have nausea and vomiting.  Eat as you normally do.  Avoid:  Foods or drinks high in sugar.  Carbonated  drinks.  Juice.  Extremely hot or cold fluids.  Drinks with caffeine.  Fatty, greasy foods.  Alcohol.  Tobacco.  Overeating.  Gelatin desserts.  Wash your hands well to avoid spreading bacteria and viruses.  Only take over-the-counter or prescription medicines for pain, discomfort, or fever as directed by your caregiver.  Ask your caregiver if you should continue all prescribed and over-the-counter medicines.  Keep all follow-up appointments with your caregiver. SEEK MEDICAL CARE IF:  You have abdominal pain and it increases or stays in one area (localizes).  You have a rash, stiff neck, or severe headache.  You are irritable, sleepy, or difficult to awaken.  You are weak, dizzy, or extremely thirsty. SEEK IMMEDIATE MEDICAL CARE IF:   You are unable to keep fluids down or you get worse despite treatment.  You have frequent episodes of vomiting or diarrhea.  You have blood or green matter (bile) in your vomit.  You have blood in your stool or your stool looks black and tarry.  You have not urinated in 6 to 8 hours, or you have only urinated a small amount of very dark urine.  You have a fever.  You faint. MAKE SURE YOU:   Understand these instructions.  Will watch your condition.  Will get help right away if you are not doing well or get worse. Document Released: 04/15/2005 Document Revised: 07/08/2011 Document Reviewed: 12/03/2010 ExitCare Patient Information 2015 [employer]. This information is not intended to replace advice given to you by your health care provider. Make sure you discuss any questions you have with your health care   provider.  

## 2013-12-16 ENCOUNTER — Ambulatory Visit (HOSPITAL_BASED_OUTPATIENT_CLINIC_OR_DEPARTMENT_OTHER): Payer: Medicaid Other

## 2013-12-16 VITALS — BP 120/82 | HR 78 | Temp 98.3°F | Resp 18

## 2013-12-16 DIAGNOSIS — C01 Malignant neoplasm of base of tongue: Secondary | ICD-10-CM

## 2013-12-16 DIAGNOSIS — R634 Abnormal weight loss: Secondary | ICD-10-CM

## 2013-12-16 DIAGNOSIS — Z5189 Encounter for other specified aftercare: Secondary | ICD-10-CM

## 2013-12-16 MED ORDER — HEPARIN SOD (PORK) LOCK FLUSH 100 UNIT/ML IV SOLN
250.0000 [IU] | Freq: Once | INTRAVENOUS | Status: AC | PRN
  Administered 2013-12-16: 250 [IU]
  Filled 2013-12-16: qty 5

## 2013-12-16 MED ORDER — SODIUM CHLORIDE 0.9 % IV SOLN
Freq: Once | INTRAVENOUS | Status: AC
  Administered 2013-12-16: 15:00:00 via INTRAVENOUS

## 2013-12-16 MED ORDER — SODIUM CHLORIDE 0.9 % IJ SOLN
3.0000 mL | Freq: Once | INTRAMUSCULAR | Status: AC
  Administered 2013-12-16: 3 mL via INTRAVENOUS
  Filled 2013-12-16: qty 10

## 2013-12-16 NOTE — Patient Instructions (Signed)
Dehydration, Adult Dehydration is when you lose more fluids from the body than you take in. Vital organs like the kidneys, brain, and heart cannot function without a proper amount of fluids and salt. Any loss of fluids from the body can cause dehydration.  CAUSES   Vomiting.  Diarrhea.  Excessive sweating.  Excessive urine output.  Fever. SYMPTOMS  Mild dehydration  Thirst.  Dry lips.  Slightly dry mouth. Moderate dehydration  Very dry mouth.  Sunken eyes.  Skin does not bounce back quickly when lightly pinched and released.  Dark urine and decreased urine production.  Decreased tear production.  Headache. Severe dehydration  Very dry mouth.  Extreme thirst.  Rapid, weak pulse (more than 100 beats per minute at rest).  Cold hands and feet.  Not able to sweat in spite of heat and temperature.  Rapid breathing.  Blue lips.  Confusion and lethargy.  Difficulty being awakened.  Minimal urine production.  No tears. DIAGNOSIS  Your caregiver will diagnose dehydration based on your symptoms and your exam. Blood and urine tests will help confirm the diagnosis. The diagnostic evaluation should also identify the cause of dehydration. TREATMENT  Treatment of mild or moderate dehydration can often be done at home by increasing the amount of fluids that you drink. It is best to drink small amounts of fluid more often. Drinking too much at one time can make vomiting worse. Refer to the home care instructions below. Severe dehydration needs to be treated at the hospital where you will probably be given intravenous (IV) fluids that contain water and electrolytes. HOME CARE INSTRUCTIONS   Ask your caregiver about specific rehydration instructions.  Drink enough fluids to keep your urine clear or pale yellow.  Drink small amounts frequently if you have nausea and vomiting.  Eat as you normally do.  Avoid:  Foods or drinks high in sugar.  Carbonated  drinks.  Juice.  Extremely hot or cold fluids.  Drinks with caffeine.  Fatty, greasy foods.  Alcohol.  Tobacco.  Overeating.  Gelatin desserts.  Wash your hands well to avoid spreading bacteria and viruses.  Only take over-the-counter or prescription medicines for pain, discomfort, or fever as directed by your caregiver.  Ask your caregiver if you should continue all prescribed and over-the-counter medicines.  Keep all follow-up appointments with your caregiver. SEEK MEDICAL CARE IF:  You have abdominal pain and it increases or stays in one area (localizes).  You have a rash, stiff neck, or severe headache.  You are irritable, sleepy, or difficult to awaken.  You are weak, dizzy, or extremely thirsty. SEEK IMMEDIATE MEDICAL CARE IF:   You are unable to keep fluids down or you get worse despite treatment.  You have frequent episodes of vomiting or diarrhea.  You have blood or green matter (bile) in your vomit.  You have blood in your stool or your stool looks black and tarry.  You have not urinated in 6 to 8 hours, or you have only urinated a small amount of very dark urine.  You have a fever.  You faint. MAKE SURE YOU:   Understand these instructions.  Will watch your condition.  Will get help right away if you are not doing well or get worse. Document Released: 04/15/2005 Document Revised: 07/08/2011 Document Reviewed: 12/03/2010 ExitCare Patient Information 2015 [employer]. This information is not intended to replace advice given to you by your health care provider. Make sure you discuss any questions you have with your health care   provider.  

## 2013-12-17 ENCOUNTER — Ambulatory Visit: Payer: Self-pay

## 2013-12-17 ENCOUNTER — Other Ambulatory Visit: Payer: Self-pay | Admitting: *Deleted

## 2013-12-17 ENCOUNTER — Ambulatory Visit (HOSPITAL_BASED_OUTPATIENT_CLINIC_OR_DEPARTMENT_OTHER): Payer: Medicaid Other

## 2013-12-17 VITALS — BP 128/75 | HR 72

## 2013-12-17 DIAGNOSIS — Z5189 Encounter for other specified aftercare: Secondary | ICD-10-CM

## 2013-12-17 DIAGNOSIS — R634 Abnormal weight loss: Secondary | ICD-10-CM

## 2013-12-17 DIAGNOSIS — C01 Malignant neoplasm of base of tongue: Secondary | ICD-10-CM

## 2013-12-17 MED ORDER — SODIUM CHLORIDE 0.9 % IV SOLN
Freq: Once | INTRAVENOUS | Status: AC
  Administered 2013-12-17: 15:00:00 via INTRAVENOUS

## 2013-12-17 MED ORDER — HEPARIN SOD (PORK) LOCK FLUSH 100 UNIT/ML IV SOLN
250.0000 [IU] | Freq: Once | INTRAVENOUS | Status: AC | PRN
  Administered 2013-12-17: 250 [IU]
  Filled 2013-12-17: qty 5

## 2013-12-17 MED ORDER — SODIUM CHLORIDE 0.9 % IJ SOLN
10.0000 mL | INTRAMUSCULAR | Status: DC | PRN
  Administered 2013-12-17: 10 mL
  Filled 2013-12-17: qty 10

## 2013-12-17 NOTE — Patient Instructions (Signed)

## 2013-12-20 ENCOUNTER — Ambulatory Visit (HOSPITAL_BASED_OUTPATIENT_CLINIC_OR_DEPARTMENT_OTHER): Payer: Medicaid Other

## 2013-12-20 ENCOUNTER — Ambulatory Visit: Payer: Self-pay

## 2013-12-20 VITALS — BP 119/81 | HR 91 | Temp 98.0°F

## 2013-12-20 DIAGNOSIS — Z452 Encounter for adjustment and management of vascular access device: Secondary | ICD-10-CM

## 2013-12-20 DIAGNOSIS — C01 Malignant neoplasm of base of tongue: Secondary | ICD-10-CM

## 2013-12-20 MED ORDER — SODIUM CHLORIDE 0.9 % IJ SOLN
10.0000 mL | INTRAMUSCULAR | Status: DC | PRN
  Administered 2013-12-20: 10 mL via INTRAVENOUS
  Filled 2013-12-20: qty 10

## 2013-12-20 MED ORDER — HEPARIN SOD (PORK) LOCK FLUSH 100 UNIT/ML IV SOLN
500.0000 [IU] | Freq: Once | INTRAVENOUS | Status: AC
  Administered 2013-12-20: 250 [IU] via INTRAVENOUS
  Filled 2013-12-20: qty 5

## 2013-12-20 NOTE — Patient Instructions (Signed)
PICC Home Guide A peripherally inserted central catheter (PICC) is a long, thin, flexible tube that is inserted into a vein in the upper arm. It is a form of intravenous (IV) access. It is considered to be a "central" line because the tip of the PICC ends in a large vein in your chest. This large vein is called the superior vena cava (SVC). The PICC tip ends in the SVC because there is a lot of blood flow in the SVC. This allows medicines and IV fluids to be quickly distributed throughout the body. The PICC is inserted using a sterile technique by a specially trained nurse or physician. After the PICC is inserted, a chest X-ray exam is done to be sure it is in the correct place.  A PICC may be placed for different reasons, such as:  To give medicines and liquid nutrition that can only be given through a central line. Examples are:  Certain antibiotic treatments.  Chemotherapy.  Total parenteral nutrition (TPN).  To take frequent blood samples.  To give IV fluids and blood products.  If there is difficulty placing a peripheral intravenous (PIV) catheter. If taken care of properly, a PICC can remain in place for several months. A PICC can also allow a person to go home from the hospital early. Medicine and PICC care can be managed at home by a family member or home health care team. WHAT PROBLEMS CAN HAPPEN WHEN I HAVE A PICC? Problems with a PICC can occasionally occur. These may include the following:  A blood clot (thrombus) forming in or at the tip of the PICC. This can cause the PICC to become clogged. A clot-dissolving medicine called tissue plasminogen activator (tPA) can be given through the PICC to help break up the clot.  Inflammation of the vein (phlebitis) in which the PICC is placed. Signs of inflammation may include redness, pain at the insertion site, red streaks, or being able to feel a "cord" in the vein where the PICC is located.  Infection in the PICC or at the insertion  site. Signs of infection may include fever, chills, redness, swelling, or pus drainage from the PICC insertion site.  PICC movement (malposition). The PICC tip may move from its original position due to excessive physical activity, forceful coughing, sneezing, or vomiting.  A break or cut in the PICC. It is important to not use scissors near the PICC.  Nerve or tendon irritation or injury during PICC insertion. WHAT SHOULD I KEEP IN MIND ABOUT ACTIVITIES WHEN I HAVE A PICC?  You may bend your arm and move it freely. If your PICC is near or at the bend of your elbow, avoid activity with repeated motion at the elbow.  Rest at home for the remainder of the day following PICC line insertion.  Avoid lifting heavy objects as instructed by your health care provider.  Avoid using a crutch with the arm on the same side as your PICC. You may need to use a walker. WHAT SHOULD I KNOW ABOUT MY PICC DRESSING?  Keep your PICC bandage (dressing) clean and dry to prevent infection.  Ask your health care provider when you may shower. Ask your health care provider to teach you how to wrap the PICC when you do take a shower.  Change the PICC dressing as instructed by your health care provider.  Change your PICC dressing if it becomes loose or wet. WHAT SHOULD I KNOW ABOUT PICC CARE?  Check the PICC insertion site   daily for leakage, redness, swelling, or pain.  Do not take a bath, swim, or use hot tubs when you have a PICC. Cover PICC line with clear plastic wrap and tape to keep it dry while showering.  Flush the PICC as directed by your health care provider. Let your health care provider know right away if the PICC is difficult to flush or does not flush. Do not use force to flush the PICC.  Do not use a syringe that is less than 10 mL to flush the PICC.  Never pull or tug on the PICC.  Avoid blood pressure checks on the arm with the PICC.  Keep your PICC identification card with you at all  times.  Do not take the PICC out yourself. Only a trained clinical professional should remove the PICC. SEEK IMMEDIATE MEDICAL CARE IF:  Your PICC is accidentally pulled all the way out. If this happens, cover the insertion site with a bandage or gauze dressing. Do not throw the PICC away. Your health care provider will need to inspect it.  Your PICC was tugged or pulled and has partially come out. Do not  push the PICC back in.  There is any type of drainage, redness, or swelling where the PICC enters the skin.  You cannot flush the PICC, it is difficult to flush, or the PICC leaks around the insertion site when it is flushed.  You hear a "flushing" sound when the PICC is flushed.  You have pain, discomfort, or numbness in your arm, shoulder, or jaw on the same side as the PICC.  You feel your heart "racing" or skipping beats.  You notice a hole or tear in the PICC.  You develop chills or a fever. MAKE SURE YOU:   Understand these instructions.  Will watch your condition.  Will get help right away if you are not doing well or get worse. Document Released: 10/20/2002 Document Revised: 08/30/2013 Document Reviewed: 12/21/2012 Exit[employer] Patient Information 2015 [employer]. This information is not intended to replace advice given to you by your health care provider. Make sure you discuss any questions you have with your health care provider.  

## 2013-12-22 ENCOUNTER — Ambulatory Visit (HOSPITAL_BASED_OUTPATIENT_CLINIC_OR_DEPARTMENT_OTHER): Payer: Medicaid Other

## 2013-12-22 ENCOUNTER — Ambulatory Visit: Payer: Self-pay

## 2013-12-22 ENCOUNTER — Telehealth: Payer: Self-pay | Admitting: *Deleted

## 2013-12-22 ENCOUNTER — Other Ambulatory Visit: Payer: Self-pay

## 2013-12-22 VITALS — BP 137/98 | HR 96 | Temp 98.2°F

## 2013-12-22 DIAGNOSIS — Z452 Encounter for adjustment and management of vascular access device: Secondary | ICD-10-CM

## 2013-12-22 DIAGNOSIS — C01 Malignant neoplasm of base of tongue: Secondary | ICD-10-CM

## 2013-12-22 MED ORDER — HEPARIN SOD (PORK) LOCK FLUSH 100 UNIT/ML IV SOLN
500.0000 [IU] | Freq: Once | INTRAVENOUS | Status: AC
  Administered 2013-12-22: 500 [IU] via INTRAVENOUS
  Filled 2013-12-22: qty 5

## 2013-12-22 MED ORDER — SODIUM CHLORIDE 0.9 % IJ SOLN
10.0000 mL | INTRAMUSCULAR | Status: DC | PRN
  Administered 2013-12-22: 10 mL via INTRAVENOUS
  Filled 2013-12-22: qty 10

## 2013-12-22 NOTE — Telephone Encounter (Signed)
1315: This RN called patient to see if he could come early for IVF appt. Pt states he didn't know he was scheduled for IVF but does have a flush rm appt at 3 pm. Angel [name] states he is eating and drinking well and does not feel he needs additional fluids today. This RN informed him we will cancel his infusion appt and to keep flush appt for today. Patient verbalizes understanding.

## 2013-12-22 NOTE — Patient Instructions (Signed)
PICC Home Guide A peripherally inserted central catheter (PICC) is a long, thin, flexible tube that is inserted into a vein in the upper arm. It is a form of intravenous (IV) access. It is considered to be a "central" line because the tip of the PICC ends in a large vein in your chest. This large vein is called the superior vena cava (SVC). The PICC tip ends in the SVC because there is a lot of blood flow in the SVC. This allows medicines and IV fluids to be quickly distributed throughout the body. The PICC is inserted using a sterile technique by a specially trained nurse or physician. After the PICC is inserted, a chest X-ray exam is done to be sure it is in the correct place.  A PICC may be placed for different reasons, such as:  To give medicines and liquid nutrition that can only be given through a central line. Examples are:  Certain antibiotic treatments.  Chemotherapy.  Total parenteral nutrition (TPN).  To take frequent blood samples.  To give IV fluids and blood products.  If there is difficulty placing a peripheral intravenous (PIV) catheter. If taken care of properly, a PICC can remain in place for several months. A PICC can also allow a person to go home from the hospital early. Medicine and PICC care can be managed at home by a family member or home health care team. WHAT PROBLEMS CAN HAPPEN WHEN I HAVE A PICC? Problems with a PICC can occasionally occur. These may include the following:  A blood clot (thrombus) forming in or at the tip of the PICC. This can cause the PICC to become clogged. A clot-dissolving medicine called tissue plasminogen activator (tPA) can be given through the PICC to help break up the clot.  Inflammation of the vein (phlebitis) in which the PICC is placed. Signs of inflammation may include redness, pain at the insertion site, red streaks, or being able to feel a "cord" in the vein where the PICC is located.  Infection in the PICC or at the insertion  site. Signs of infection may include fever, chills, redness, swelling, or pus drainage from the PICC insertion site.  PICC movement (malposition). The PICC tip may move from its original position due to excessive physical activity, forceful coughing, sneezing, or vomiting.  A break or cut in the PICC. It is important to not use scissors near the PICC.  Nerve or tendon irritation or injury during PICC insertion. WHAT SHOULD I KEEP IN MIND ABOUT ACTIVITIES WHEN I HAVE A PICC?  You may bend your arm and move it freely. If your PICC is near or at the bend of your elbow, avoid activity with repeated motion at the elbow.  Rest at home for the remainder of the day following PICC line insertion.  Avoid lifting heavy objects as instructed by your health care provider.  Avoid using a crutch with the arm on the same side as your PICC. You may need to use a walker. WHAT SHOULD I KNOW ABOUT MY PICC DRESSING?  Keep your PICC bandage (dressing) clean and dry to prevent infection.  Ask your health care provider when you may shower. Ask your health care provider to teach you how to wrap the PICC when you do take a shower.  Change the PICC dressing as instructed by your health care provider.  Change your PICC dressing if it becomes loose or wet. WHAT SHOULD I KNOW ABOUT PICC CARE?  Check the PICC insertion site   daily for leakage, redness, swelling, or pain.  Do not take a bath, swim, or use hot tubs when you have a PICC. Cover PICC line with clear plastic wrap and tape to keep it dry while showering.  Flush the PICC as directed by your health care provider. Let your health care provider know right away if the PICC is difficult to flush or does not flush. Do not use force to flush the PICC.  Do not use a syringe that is less than 10 mL to flush the PICC.  Never pull or tug on the PICC.  Avoid blood pressure checks on the arm with the PICC.  Keep your PICC identification card with you at all  times.  Do not take the PICC out yourself. Only a trained clinical professional should remove the PICC. SEEK IMMEDIATE MEDICAL CARE IF:  Your PICC is accidentally pulled all the way out. If this happens, cover the insertion site with a bandage or gauze dressing. Do not throw the PICC away. Your health care provider will need to inspect it.  Your PICC was tugged or pulled and has partially come out. Do not  push the PICC back in.  There is any type of drainage, redness, or swelling where the PICC enters the skin.  You cannot flush the PICC, it is difficult to flush, or the PICC leaks around the insertion site when it is flushed.  You hear a "flushing" sound when the PICC is flushed.  You have pain, discomfort, or numbness in your arm, shoulder, or jaw on the same side as the PICC.  You feel your heart "racing" or skipping beats.  You notice a hole or tear in the PICC.  You develop chills or a fever. MAKE SURE YOU:   Understand these instructions.  Will watch your condition.  Will get help right away if you are not doing well or get worse. Document Released: 10/20/2002 Document Revised: 08/30/2013 Document Reviewed: 12/21/2012 Exit[employer] Patient Information 2015 [employer]. This information is not intended to replace advice given to you by your health care provider. Make sure you discuss any questions you have with your health care provider.  

## 2013-12-24 ENCOUNTER — Encounter: Payer: Self-pay | Admitting: *Deleted

## 2013-12-24 ENCOUNTER — Ambulatory Visit (HOSPITAL_BASED_OUTPATIENT_CLINIC_OR_DEPARTMENT_OTHER): Payer: Medicaid Other | Admitting: Hematology and Oncology

## 2013-12-24 ENCOUNTER — Other Ambulatory Visit: Payer: Self-pay

## 2013-12-24 ENCOUNTER — Telehealth: Payer: Self-pay | Admitting: Hematology and Oncology

## 2013-12-24 ENCOUNTER — Ambulatory Visit (HOSPITAL_BASED_OUTPATIENT_CLINIC_OR_DEPARTMENT_OTHER): Payer: Medicaid Other

## 2013-12-24 ENCOUNTER — Ambulatory Visit: Payer: Self-pay

## 2013-12-24 VITALS — BP 130/69 | HR 87 | Temp 98.6°F | Resp 18 | Ht 65.0 in | Wt 119.2 lb

## 2013-12-24 DIAGNOSIS — C01 Malignant neoplasm of base of tongue: Secondary | ICD-10-CM

## 2013-12-24 DIAGNOSIS — D75839 Thrombocytosis, unspecified: Secondary | ICD-10-CM

## 2013-12-24 DIAGNOSIS — Z95828 Presence of other vascular implants and grafts: Secondary | ICD-10-CM

## 2013-12-24 DIAGNOSIS — T148XXA Other injury of unspecified body region, initial encounter: Secondary | ICD-10-CM

## 2013-12-24 DIAGNOSIS — D72829 Elevated white blood cell count, unspecified: Secondary | ICD-10-CM

## 2013-12-24 DIAGNOSIS — F17200 Nicotine dependence, unspecified, uncomplicated: Secondary | ICD-10-CM

## 2013-12-24 DIAGNOSIS — D630 Anemia in neoplastic disease: Secondary | ICD-10-CM

## 2013-12-24 DIAGNOSIS — D473 Essential (hemorrhagic) thrombocythemia: Secondary | ICD-10-CM

## 2013-12-24 DIAGNOSIS — R634 Abnormal weight loss: Secondary | ICD-10-CM

## 2013-12-24 MED ORDER — SODIUM CHLORIDE 0.9 % IJ SOLN
10.0000 mL | INTRAMUSCULAR | Status: DC | PRN
  Administered 2013-12-24: 10 mL via INTRAVENOUS
  Filled 2013-12-24: qty 10

## 2013-12-24 MED ORDER — HEPARIN SOD (PORK) LOCK FLUSH 100 UNIT/ML IV SOLN
500.0000 [IU] | Freq: Once | INTRAVENOUS | Status: AC
  Administered 2013-12-24: 500 [IU] via INTRAVENOUS
  Filled 2013-12-24: qty 5

## 2013-12-24 NOTE — Telephone Encounter (Signed)
Pt confirmed labs/ov per 08/28 POF, gave pt AVS....KJ °

## 2013-12-24 NOTE — Progress Notes (Signed)
Message from flush nurse Sharyn Lull T stating that she has been informed by Cameo, Dr. Calton Dach nurse to cancel patient's infusion appt. For today. HL

## 2013-12-24 NOTE — Progress Notes (Signed)
To provide support, encouragement and care continuity, met with patient and his wife during Est Pt appt with Dr. Alvy Bimler. 1. He reported absence of pain, N&V. 2. Stated energy level close to baseline. 3. Good swallowing capability, particular soft foods, Ensure, milk, Carnation Instant Breakfast.  Unable to eat solids b/c edentulous. 4. Addressed their question re: IVF fluids as presently scheduled. 5. He reported he continues to smoke cigarettes, ca. 5 /day.  Dr. Alvy Bimler set a goal to reduce this to 3/d by next Friday when she sees him again.  I provided him information re: smoking cessation support offered by [facility]. Continuing navigation as L2 patient (treatments established).  Gayleen Orem, RN, BSN, [place] [facility] [phone]

## 2013-12-26 NOTE — Assessment & Plan Note (Signed)
This is due to recent treatment. Monitor closely.

## 2013-12-26 NOTE — Assessment & Plan Note (Signed)
Continue daily wound dressing. The skin surrounding the wound does not appear to be infected. The external wound appears to be getting smaller.

## 2013-12-26 NOTE — Assessment & Plan Note (Signed)
This is likely related to the recent Neulasta injection and necrotic tumor on his neck.

## 2013-12-26 NOTE — Assessment & Plan Note (Addendum)
Recent workup such as anemia chronic disease. He is not symptomatic. Recommend observation only. I would transfuse if hemoglobin dropped to less than 8 g.

## 2013-12-26 NOTE — Progress Notes (Signed)
[facility] OFFICE PROGRESS NOTE  Patient Care Team: [name], MD as PCP - General (Hematology and Oncology) Brooks Sailors, RN as Oncology Nurse Navigator  SUMMARY OF ONCOLOGIC HISTORY: Oncology History   Cancer of base of tongue   Primary site: Pharynx - Oropharynx   Staging method: AJCC 7th Edition   Clinical: Stage IVB (T2, N3, M0) signed by [name], MD on 10/15/2013 10:25 AM   Summary: Stage IVB (T2, N3, M0)       Cancer of base of tongue   09/17/2013 Imaging CT scan of the neck showed large bilateral neck lymphadenopathy with associated tongue mass.   09/23/2013 Procedure Fine needle aspirate of the left neck mass came back positive for squamous cell carcinoma.   10/04/2013 Surgery Laryngoscopy showed a firm mass within the left tongue base extending past the midline and encompassing much of the tongue base.  There is extension of the mass into the vallecula and on to the lingual surface of the epiglottis.   10/04/2013 Pathology Results Vallecula and tongue biopsy confirmed squamous cell carcinoma.   10/15/2013 Imaging PET/CT scan showed no evidence of distant metastatic disease apart from just regional lymph node involvement.   10/25/2013 - 11/01/2013 Hospital Admission    10/27/2013 -  Chemotherapy He is started on induction chemo with cisplatin, taxotere, 5 FU   11/03/2013 - 11/07/2013 Hospital Admission     INTERVAL HISTORY: Please see below for problem oriented charting. He is seen for his weekly supportive care after 3 cycles of chemotherapy. He continues to have persistent pain in the neck. He continues to smoke. He denies any mouth sores or diarrhea.  REVIEW OF SYSTEMS:   Constitutional: Denies fevers, chills  Eyes: Denies blurriness of vision Ears, nose, mouth, throat, and face: Denies mucositis or sore throat Respiratory: Denies cough, dyspnea or wheezes Cardiovascular: Denies palpitation, chest discomfort or lower extremity swelling Gastrointestinal:   Denies nausea, heartburn or change in bowel habits Skin: Denies abnormal skin rashes Lymphatics: Denies new lymphadenopathy or easy bruising Neurological:Denies numbness, tingling or new weaknesses Behavioral/Psych: Mood is stable, no new changes  All other systems were reviewed with the patient and are negative.  I have reviewed the past medical history, past surgical history, social history and family history with the patient and they are unchanged from previous note.  ALLERGIES:  has No Known Allergies.  MEDICATIONS:  Current Outpatient Prescriptions  Medication Sig Dispense Refill  . acetaminophen (TYLENOL) 325 MG tablet Take 650 mg by mouth every 6 (six) hours as needed for mild pain or moderate pain.      Marland Kitchen albuterol (PROVENTIL) (2.5 MG/3ML) 0.083% nebulizer solution Take 3 mLs (2.5 mg total) by nebulization every 6 (six) hours as needed for wheezing or shortness of breath.  75 mL  12  . antiseptic oral rinse (BIOTENE) LIQD 15 mLs by Mouth Rinse route 2 (two) times daily.  1 Bottle  2  . chlorhexidine (PERIDEX) 0.12 % solution Rinse with 15 mls twice daily for 30 seconds. Use after breakfast and at bedtime. Spit out excess. Do not swallow.  480 mL  prn  . dexamethasone (DECADRON) 4 MG tablet Start the day before Taxotere. Take once the day after, then 2 times a day x 2 days. Repeat in 3 weeks  30 tablet  1  . feeding supplement, ENSURE COMPLETE, (ENSURE COMPLETE) LIQD Take 237 mLs by mouth 2 (two) times daily between meals.      . lactulose ([place]) 10 GM/15ML  solution Take 15 mLs (10 g total) by mouth 2 (two) times daily as needed for mild constipation.  240 mL  2  . magnesium oxide (MAG-OX) 400 (241.3 MG) MG tablet Take 1 tablet (400 mg total) by mouth daily.  60 tablet  1  . morphine (MS CONTIN) 15 MG 12 hr tablet Take 1 tablet (15 mg total) by mouth every 12 (twelve) hours.  60 tablet  0  . Morphine Sulfate (MORPHINE CONCENTRATE) 10 mg / 0.5 ml concentrated solution Take 0.25 mLs  (5 mg total) by mouth every 2 (two) hours as needed for severe pain.  60 mL  0  . pantoprazole (PROTONIX) 40 MG tablet Take 1 tablet (40 mg total) by mouth daily.  30 tablet  2  . polyethylene glycol (MIRALAX / GLYCOLAX) packet Take 17 g by mouth daily.  30 each  2  . senna-docusate (SENOKOT-S) 8.6-50 MG per tablet Take 2 tablets by mouth 2 (two) times daily.  120 tablet  0  . temazepam (RESTORIL) 15 MG capsule TAKE 1 CAPSULE BY MOUTH AT BEDTIME AS NEEDED FOR SLEEP  30 capsule  0   No current facility-administered medications for this visit.    PHYSICAL EXAMINATION: ECOG PERFORMANCE STATUS: 1 - Symptomatic but completely ambulatory  Filed Vitals:   12/24/13 1350  BP: 130/69  Pulse: 87  Temp: 98.6 F (37 C)  Resp: 18   Filed Weights   12/24/13 1350  Weight: 119 lb 3.2 oz (54.069 kg)    GENERAL:alert, no distress and comfortable. He looks thin and mildly cachectic SKIN: mild erythema on the bace of his neck. I managed to express approximately one cupfull of pus from his neck. EYES: normal, Conjunctiva are pink and non-injected, sclera clear OROPHARYNX:no exudate, no erythema and lips, buccal mucosa, and tongue normal  NECK: supple, thyroid normal size, non-tender, without nodularity LYMPH:  no palpable lymphadenopathy in the cervical, axillary or inguinal LUNGS: clear to auscultation and percussion with normal breathing effort HEART: regular rate & rhythm and no murmurs and no lower extremity edema ABDOMEN:abdomen soft, non-tender and normal bowel sounds Musculoskeletal:no cyanosis of digits and no clubbing  NEURO: alert & oriented x 3 with fluent speech, no focal motor/sensory deficits  LABORATORY DATA:  I have reviewed the data as listed    Component Value Date/Time   NA 141 12/08/2013 0805   NA 133* 11/06/2013 0445   K 4.2 12/08/2013 0805   K 3.5* 11/06/2013 0445   CL 98 11/06/2013 0445   CO2 24 12/08/2013 0805   CO2 21 11/06/2013 0445   GLUCOSE 194* 12/08/2013 0805    GLUCOSE 99 11/06/2013 0445   BUN 11.8 12/08/2013 0805   BUN 11 11/06/2013 0445   CREATININE 0.7 12/08/2013 0805   CREATININE 0.63 11/06/2013 0445   CALCIUM 9.4 12/08/2013 0805   CALCIUM 7.0* 11/06/2013 0445   PROT 6.1* 12/08/2013 0805   PROT 5.6* 11/03/2013 2220   ALBUMIN 3.2* 12/08/2013 0805   ALBUMIN 2.5* 11/03/2013 2220   AST 12 12/08/2013 0805   AST 7 11/03/2013 2220   ALT <6 12/08/2013 0805   ALT 9 11/03/2013 2220   ALKPHOS 81 12/08/2013 0805   ALKPHOS 53 11/03/2013 2220   BILITOT <0.20 12/08/2013 0805   BILITOT 0.6 11/03/2013 2220   GFRNONAA >90 11/06/2013 0445   GFRAA >90 11/06/2013 0445    No results found for this basename: SPEP, UPEP,  kappa and lambda light chains    Lab Results  Component Value Date  WBC 22.1* 12/08/2013   NEUTROABS 17.6* 12/08/2013   HGB 10.2* 12/08/2013   HCT 31.3* 12/08/2013   MCV 103.3* 12/08/2013   PLT 651* 12/08/2013      Chemistry      Component Value Date/Time   NA 141 12/08/2013 0805   NA 133* 11/06/2013 0445   K 4.2 12/08/2013 0805   K 3.5* 11/06/2013 0445   CL 98 11/06/2013 0445   CO2 24 12/08/2013 0805   CO2 21 11/06/2013 0445   BUN 11.8 12/08/2013 0805   BUN 11 11/06/2013 0445   CREATININE 0.7 12/08/2013 0805   CREATININE 0.63 11/06/2013 0445      Component Value Date/Time   CALCIUM 9.4 12/08/2013 0805   CALCIUM 7.0* 11/06/2013 0445   ALKPHOS 81 12/08/2013 0805   ALKPHOS 53 11/03/2013 2220   AST 12 12/08/2013 0805   AST 7 11/03/2013 2220   ALT <6 12/08/2013 0805   ALT 9 11/03/2013 2220   BILITOT <0.20 12/08/2013 0805   BILITOT 0.6 11/03/2013 2220      ASSESSMENT & PLAN:  Cancer of base of tongue He has remarkable response to chemotherapy. Continue aggressive supportive care. The patient is encouraged. PET/CT scan has been ordered to be done within 2 weeks.   Wound discharge Continue daily wound dressing. The skin surrounding the wound does not appear to be infected. The external wound appears to be getting smaller.          Weight loss He has not  lost weight and the serum albumin is improving. I continue to encourage the patient.      Thrombocytosis This is due to recent treatment. Monitor closely.      Leukocytosis, unspecified This is likely related to the recent Neulasta injection and necrotic tumor on his neck.         Anemia in neoplastic disease Recent workup such as anemia chronic disease. He is not symptomatic. Recommend observation only. I would transfuse if hemoglobin dropped to less than 8 g. I spent some time counseling the patient the importance of tobacco cessation. He is currently not interested to quit now.  All questions were answered. The patient knows to call the clinic with any problems, questions or concerns. No barriers to learning was detected. I spent 25 minutes counseling the patient face to face. The total time spent in the appointment was 30 minutes and more than 50% was on counseling and review of test results     [facility], [place], MD 12/26/2013 9:29 PM

## 2013-12-26 NOTE — Assessment & Plan Note (Addendum)
He has remarkable response to chemotherapy. Continue aggressive supportive care. The patient is encouraged. PET/CT scan has been ordered to be done within 2 weeks.

## 2013-12-26 NOTE — Assessment & Plan Note (Signed)
He has not lost weight and the serum albumin is improving. I continue to encourage the patient.

## 2013-12-27 ENCOUNTER — Ambulatory Visit (HOSPITAL_BASED_OUTPATIENT_CLINIC_OR_DEPARTMENT_OTHER): Payer: Medicaid Other

## 2013-12-27 VITALS — BP 118/69 | HR 92 | Temp 98.3°F | Resp 16

## 2013-12-27 DIAGNOSIS — C01 Malignant neoplasm of base of tongue: Secondary | ICD-10-CM

## 2013-12-27 DIAGNOSIS — Z452 Encounter for adjustment and management of vascular access device: Secondary | ICD-10-CM

## 2013-12-27 MED ORDER — HEPARIN SOD (PORK) LOCK FLUSH 100 UNIT/ML IV SOLN
500.0000 [IU] | Freq: Once | INTRAVENOUS | Status: AC
  Administered 2013-12-27: 250 [IU] via INTRAVENOUS
  Filled 2013-12-27: qty 5

## 2013-12-27 MED ORDER — SODIUM CHLORIDE 0.9 % IJ SOLN
10.0000 mL | INTRAMUSCULAR | Status: DC | PRN
  Administered 2013-12-27: 10 mL via INTRAVENOUS
  Filled 2013-12-27: qty 10

## 2013-12-27 NOTE — Patient Instructions (Signed)
PICC Home Guide A peripherally inserted central catheter (PICC) is a long, thin, flexible tube that is inserted into a vein in the upper arm. It is a form of intravenous (IV) access. It is considered to be a "central" line because the tip of the PICC ends in a large vein in your chest. This large vein is called the superior vena cava (SVC). The PICC tip ends in the SVC because there is a lot of blood flow in the SVC. This allows medicines and IV fluids to be quickly distributed throughout the body. The PICC is inserted using a sterile technique by a specially trained nurse or physician. After the PICC is inserted, a chest X-ray exam is done to be sure it is in the correct place.  A PICC may be placed for different reasons, such as:  To give medicines and liquid nutrition that can only be given through a central line. Examples are:  Certain antibiotic treatments.  Chemotherapy.  Total parenteral nutrition (TPN).  To take frequent blood samples.  To give IV fluids and blood products.  If there is difficulty placing a peripheral intravenous (PIV) catheter. If taken care of properly, a PICC can remain in place for several months. A PICC can also allow a person to go home from the hospital early. Medicine and PICC care can be managed at home by a family member or home health care team. WHAT PROBLEMS CAN HAPPEN WHEN I HAVE A PICC? Problems with a PICC can occasionally occur. These may include the following:  A blood clot (thrombus) forming in or at the tip of the PICC. This can cause the PICC to become clogged. A clot-dissolving medicine called tissue plasminogen activator (tPA) can be given through the PICC to help break up the clot.  Inflammation of the vein (phlebitis) in which the PICC is placed. Signs of inflammation may include redness, pain at the insertion site, red streaks, or being able to feel a "cord" in the vein where the PICC is located.  Infection in the PICC or at the insertion  site. Signs of infection may include fever, chills, redness, swelling, or pus drainage from the PICC insertion site.  PICC movement (malposition). The PICC tip may move from its original position due to excessive physical activity, forceful coughing, sneezing, or vomiting.  A break or cut in the PICC. It is important to not use scissors near the PICC.  Nerve or tendon irritation or injury during PICC insertion. WHAT SHOULD I KEEP IN MIND ABOUT ACTIVITIES WHEN I HAVE A PICC?  You may bend your arm and move it freely. If your PICC is near or at the bend of your elbow, avoid activity with repeated motion at the elbow.  Rest at home for the remainder of the day following PICC line insertion.  Avoid lifting heavy objects as instructed by your health care provider.  Avoid using a crutch with the arm on the same side as your PICC. You may need to use a walker. WHAT SHOULD I KNOW ABOUT MY PICC DRESSING?  Keep your PICC bandage (dressing) clean and dry to prevent infection.  Ask your health care provider when you may shower. Ask your health care provider to teach you how to wrap the PICC when you do take a shower.  Change the PICC dressing as instructed by your health care provider.  Change your PICC dressing if it becomes loose or wet. WHAT SHOULD I KNOW ABOUT PICC CARE?  Check the PICC insertion site   daily for leakage, redness, swelling, or pain.  Do not take a bath, swim, or use hot tubs when you have a PICC. Cover PICC line with clear plastic wrap and tape to keep it dry while showering.  Flush the PICC as directed by your health care provider. Let your health care provider know right away if the PICC is difficult to flush or does not flush. Do not use force to flush the PICC.  Do not use a syringe that is less than 10 mL to flush the PICC.  Never pull or tug on the PICC.  Avoid blood pressure checks on the arm with the PICC.  Keep your PICC identification card with you at all  times.  Do not take the PICC out yourself. Only a trained clinical professional should remove the PICC. SEEK IMMEDIATE MEDICAL CARE IF:  Your PICC is accidentally pulled all the way out. If this happens, cover the insertion site with a bandage or gauze dressing. Do not throw the PICC away. Your health care provider will need to inspect it.  Your PICC was tugged or pulled and has partially come out. Do not  push the PICC back in.  There is any type of drainage, redness, or swelling where the PICC enters the skin.  You cannot flush the PICC, it is difficult to flush, or the PICC leaks around the insertion site when it is flushed.  You hear a "flushing" sound when the PICC is flushed.  You have pain, discomfort, or numbness in your arm, shoulder, or jaw on the same side as the PICC.  You feel your heart "racing" or skipping beats.  You notice a hole or tear in the PICC.  You develop chills or a fever. MAKE SURE YOU:   Understand these instructions.  Will watch your condition.  Will get help right away if you are not doing well or get worse. Document Released: 10/20/2002 Document Revised: 08/30/2013 Document Reviewed: 12/21/2012 Exit[employer] Patient Information 2015 [employer]. This information is not intended to replace advice given to you by your health care provider. Make sure you discuss any questions you have with your health care provider.  

## 2013-12-29 ENCOUNTER — Ambulatory Visit (HOSPITAL_BASED_OUTPATIENT_CLINIC_OR_DEPARTMENT_OTHER): Payer: Medicaid Other

## 2013-12-29 VITALS — BP 110/78 | HR 115

## 2013-12-29 DIAGNOSIS — C01 Malignant neoplasm of base of tongue: Secondary | ICD-10-CM

## 2013-12-29 DIAGNOSIS — Z452 Encounter for adjustment and management of vascular access device: Secondary | ICD-10-CM

## 2013-12-29 MED ORDER — SODIUM CHLORIDE 0.9 % IJ SOLN
10.0000 mL | INTRAMUSCULAR | Status: DC | PRN
  Administered 2013-12-29: 10 mL via INTRAVENOUS
  Filled 2013-12-29: qty 10

## 2013-12-29 MED ORDER — HEPARIN SOD (PORK) LOCK FLUSH 100 UNIT/ML IV SOLN
500.0000 [IU] | Freq: Once | INTRAVENOUS | Status: AC
  Administered 2013-12-29: 250 [IU] via INTRAVENOUS
  Filled 2013-12-29: qty 5

## 2013-12-29 NOTE — Patient Instructions (Signed)
PICC Home Guide A peripherally inserted central catheter (PICC) is a long, thin, flexible tube that is inserted into a vein in the upper arm. It is a form of intravenous (IV) access. It is considered to be a "central" line because the tip of the PICC ends in a large vein in your chest. This large vein is called the superior vena cava (SVC). The PICC tip ends in the SVC because there is a lot of blood flow in the SVC. This allows medicines and IV fluids to be quickly distributed throughout the body. The PICC is inserted using a sterile technique by a specially trained nurse or physician. After the PICC is inserted, a chest X-ray exam is done to be sure it is in the correct place.  A PICC may be placed for different reasons, such as:  To give medicines and liquid nutrition that can only be given through a central line. Examples are:  Certain antibiotic treatments.  Chemotherapy.  Total parenteral nutrition (TPN).  To take frequent blood samples.  To give IV fluids and blood products.  If there is difficulty placing a peripheral intravenous (PIV) catheter. If taken care of properly, a PICC can remain in place for several months. A PICC can also allow a person to go home from the hospital early. Medicine and PICC care can be managed at home by a family member or home health care team. WHAT PROBLEMS CAN HAPPEN WHEN I HAVE A PICC? Problems with a PICC can occasionally occur. These may include the following:  A blood clot (thrombus) forming in or at the tip of the PICC. This can cause the PICC to become clogged. A clot-dissolving medicine called tissue plasminogen activator (tPA) can be given through the PICC to help break up the clot.  Inflammation of the vein (phlebitis) in which the PICC is placed. Signs of inflammation may include redness, pain at the insertion site, red streaks, or being able to feel a "cord" in the vein where the PICC is located.  Infection in the PICC or at the insertion  site. Signs of infection may include fever, chills, redness, swelling, or pus drainage from the PICC insertion site.  PICC movement (malposition). The PICC tip may move from its original position due to excessive physical activity, forceful coughing, sneezing, or vomiting.  A break or cut in the PICC. It is important to not use scissors near the PICC.  Nerve or tendon irritation or injury during PICC insertion. WHAT SHOULD I KEEP IN MIND ABOUT ACTIVITIES WHEN I HAVE A PICC?  You may bend your arm and move it freely. If your PICC is near or at the bend of your elbow, avoid activity with repeated motion at the elbow.  Rest at home for the remainder of the day following PICC line insertion.  Avoid lifting heavy objects as instructed by your health care provider.  Avoid using a crutch with the arm on the same side as your PICC. You may need to use a walker. WHAT SHOULD I KNOW ABOUT MY PICC DRESSING?  Keep your PICC bandage (dressing) clean and dry to prevent infection.  Ask your health care provider when you may shower. Ask your health care provider to teach you how to wrap the PICC when you do take a shower.  Change the PICC dressing as instructed by your health care provider.  Change your PICC dressing if it becomes loose or wet. WHAT SHOULD I KNOW ABOUT PICC CARE?  Check the PICC insertion site   daily for leakage, redness, swelling, or pain.  Do not take a bath, swim, or use hot tubs when you have a PICC. Cover PICC line with clear plastic wrap and tape to keep it dry while showering.  Flush the PICC as directed by your health care provider. Let your health care provider know right away if the PICC is difficult to flush or does not flush. Do not use force to flush the PICC.  Do not use a syringe that is less than 10 mL to flush the PICC.  Never pull or tug on the PICC.  Avoid blood pressure checks on the arm with the PICC.  Keep your PICC identification card with you at all  times.  Do not take the PICC out yourself. Only a trained clinical professional should remove the PICC. SEEK IMMEDIATE MEDICAL CARE IF:  Your PICC is accidentally pulled all the way out. If this happens, cover the insertion site with a bandage or gauze dressing. Do not throw the PICC away. Your health care provider will need to inspect it.  Your PICC was tugged or pulled and has partially come out. Do not  push the PICC back in.  There is any type of drainage, redness, or swelling where the PICC enters the skin.  You cannot flush the PICC, it is difficult to flush, or the PICC leaks around the insertion site when it is flushed.  You hear a "flushing" sound when the PICC is flushed.  You have pain, discomfort, or numbness in your arm, shoulder, or jaw on the same side as the PICC.  You feel your heart "racing" or skipping beats.  You notice a hole or tear in the PICC.  You develop chills or a fever. MAKE SURE YOU:   Understand these instructions.  Will watch your condition.  Will get help right away if you are not doing well or get worse. Document Released: 10/20/2002 Document Revised: 08/30/2013 Document Reviewed: 12/21/2012 Exit[employer] Patient Information 2015 [employer]. This information is not intended to replace advice given to you by your health care provider. Make sure you discuss any questions you have with your health care provider.  

## 2013-12-31 ENCOUNTER — Ambulatory Visit: Payer: Medicaid Other

## 2013-12-31 ENCOUNTER — Encounter: Payer: Self-pay | Admitting: Hematology and Oncology

## 2013-12-31 ENCOUNTER — Other Ambulatory Visit (HOSPITAL_BASED_OUTPATIENT_CLINIC_OR_DEPARTMENT_OTHER): Payer: Medicaid Other

## 2013-12-31 ENCOUNTER — Ambulatory Visit (HOSPITAL_BASED_OUTPATIENT_CLINIC_OR_DEPARTMENT_OTHER): Payer: Medicaid Other | Admitting: Hematology and Oncology

## 2013-12-31 ENCOUNTER — Other Ambulatory Visit: Payer: Self-pay | Admitting: *Deleted

## 2013-12-31 ENCOUNTER — Ambulatory Visit (HOSPITAL_BASED_OUTPATIENT_CLINIC_OR_DEPARTMENT_OTHER): Payer: Medicaid Other

## 2013-12-31 VITALS — BP 115/71 | HR 82 | Temp 98.0°F | Resp 18 | Ht 65.0 in | Wt 124.7 lb

## 2013-12-31 DIAGNOSIS — T148XXA Other injury of unspecified body region, initial encounter: Secondary | ICD-10-CM

## 2013-12-31 DIAGNOSIS — D630 Anemia in neoplastic disease: Secondary | ICD-10-CM

## 2013-12-31 DIAGNOSIS — C01 Malignant neoplasm of base of tongue: Secondary | ICD-10-CM

## 2013-12-31 DIAGNOSIS — D75839 Thrombocytosis, unspecified: Secondary | ICD-10-CM

## 2013-12-31 DIAGNOSIS — Z452 Encounter for adjustment and management of vascular access device: Secondary | ICD-10-CM

## 2013-12-31 DIAGNOSIS — D473 Essential (hemorrhagic) thrombocythemia: Secondary | ICD-10-CM

## 2013-12-31 DIAGNOSIS — Z299 Encounter for prophylactic measures, unspecified: Secondary | ICD-10-CM

## 2013-12-31 DIAGNOSIS — M542 Cervicalgia: Secondary | ICD-10-CM

## 2013-12-31 DIAGNOSIS — Z23 Encounter for immunization: Secondary | ICD-10-CM

## 2013-12-31 DIAGNOSIS — D72829 Elevated white blood cell count, unspecified: Secondary | ICD-10-CM

## 2013-12-31 DIAGNOSIS — R634 Abnormal weight loss: Secondary | ICD-10-CM

## 2013-12-31 LAB — CBC WITH DIFFERENTIAL/PLATELET
BASO%: 1.3 % (ref 0.0–2.0)
Basophils Absolute: 0.2 10*3/uL — ABNORMAL HIGH (ref 0.0–0.1)
EOS ABS: 0.6 10*3/uL — AB (ref 0.0–0.5)
EOS%: 4.3 % (ref 0.0–7.0)
HCT: 29.9 % — ABNORMAL LOW (ref 38.4–49.9)
HGB: 9.6 g/dL — ABNORMAL LOW (ref 13.0–17.1)
LYMPH%: 18.3 % (ref 14.0–49.0)
MCH: 33.2 pg (ref 27.2–33.4)
MCHC: 31.9 g/dL — AB (ref 32.0–36.0)
MCV: 104.1 fL — ABNORMAL HIGH (ref 79.3–98.0)
MONO#: 1.3 10*3/uL — AB (ref 0.1–0.9)
MONO%: 10.2 % (ref 0.0–14.0)
NEUT#: 8.7 10*3/uL — ABNORMAL HIGH (ref 1.5–6.5)
NEUT%: 65.9 % (ref 39.0–75.0)
Platelets: 481 10*3/uL — ABNORMAL HIGH (ref 140–400)
RBC: 2.87 10*6/uL — ABNORMAL LOW (ref 4.20–5.82)
RDW: 20.4 % — ABNORMAL HIGH (ref 11.0–14.6)
WBC: 13.2 10*3/uL — AB (ref 4.0–10.3)
lymph#: 2.4 10*3/uL (ref 0.9–3.3)

## 2013-12-31 LAB — COMPREHENSIVE METABOLIC PANEL (CC13)
ALT: 7 U/L (ref 0–55)
ANION GAP: 8 meq/L (ref 3–11)
AST: 11 U/L (ref 5–34)
Albumin: 3.2 g/dL — ABNORMAL LOW (ref 3.5–5.0)
Alkaline Phosphatase: 53 U/L (ref 40–150)
BUN: 12.8 mg/dL (ref 7.0–26.0)
CO2: 25 meq/L (ref 22–29)
Calcium: 8.6 mg/dL (ref 8.4–10.4)
Chloride: 105 mEq/L (ref 98–109)
Creatinine: 0.7 mg/dL (ref 0.7–1.3)
GLUCOSE: 88 mg/dL (ref 70–140)
Potassium: 4.2 mEq/L (ref 3.5–5.1)
Sodium: 138 mEq/L (ref 136–145)
Total Protein: 5.7 g/dL — ABNORMAL LOW (ref 6.4–8.3)

## 2013-12-31 LAB — MAGNESIUM (CC13): MAGNESIUM: 1.9 mg/dL (ref 1.5–2.5)

## 2013-12-31 LAB — HOLD TUBE, BLOOD BANK

## 2013-12-31 MED ORDER — INFLUENZA VAC SPLIT QUAD 0.5 ML IM SUSY
0.5000 mL | PREFILLED_SYRINGE | Freq: Once | INTRAMUSCULAR | Status: AC
  Administered 2013-12-31: 0.5 mL via INTRAMUSCULAR
  Filled 2013-12-31: qty 0.5

## 2013-12-31 MED ORDER — SODIUM CHLORIDE 0.9 % IJ SOLN
10.0000 mL | INTRAMUSCULAR | Status: DC | PRN
  Administered 2013-12-31: 10 mL via INTRAVENOUS
  Filled 2013-12-31: qty 10

## 2013-12-31 MED ORDER — HEPARIN SOD (PORK) LOCK FLUSH 100 UNIT/ML IV SOLN
500.0000 [IU] | Freq: Once | INTRAVENOUS | Status: AC
  Administered 2013-12-31: 250 [IU] via INTRAVENOUS
  Filled 2013-12-31: qty 5

## 2013-12-31 NOTE — Patient Instructions (Signed)
PICC Removal °A peripherally inserted central catheter (PICC) is a long, thin, flexible tube that a health care provider can insert into a vein in your upper arm. It is a type of IV. Having a PICC in place gives health care providers quick access to your veins. It is a good way to distribute medicines and fluids quickly throughout your body. °LET YOUR HEALTH CARE PROVIDER KNOW ABOUT: °· Any allergies you have. °· All medicines you are taking, including vitamins, herbs, eye drops, creams, and over-the-counter medicines. °· Previous problems you or members of your family have had with the use of anesthetics. °· Any blood disorders you have. °· Previous surgeries you have had. °· Medical conditions you have. °RISKS AND COMPLICATIONS °Generally, this is a safe procedure. However, as with any procedure, problems can occur. Possible problems include: °· Bleeding. °· Infection. °BEFORE THE PROCEDURE °You need an order from your health care provider to have your PICC removed. Only a health care provider trained in PICC removal should take it out. You may have your PICC removed in the hospital or in an outpatient setting. °PROCEDURE °Having a PICC removed is usually painless. Removal of the tape that holds the PICC in place may be the most uncomfortable part. Do not take out the PICC yourself. Only a trained clinical professional, such as a PICC nurse, should remove it. If your health care provider thinks your PICC is infected, the tip may be sent to the lab for testing. °After taking out your PICC, your health care provider may:  °· Hold gentle pressure on the exit site. °· Apply some antibiotic ointment. °· Place a small bandage over the insertion site. °AFTER THE PROCEDURE °You should be able to remove the bandage after 24 hours. Follow all your health care provider's instructions.  °· Keep the insertion site clean by washing it gently with soap and water. °· Do not pick or remove a scab. °· Avoid strenuous physical  activity for a day or two. °· Let your health care provider know if you develop redness, soreness, bleeding, swelling, or drainage from the insertion site. °· Let your health care provider know if you develop chills or fever. °Document Released: 10/03/2009 Document Revised: 04/20/2013 Document Reviewed: 02/05/2013 °Exit[employer]® Patient Information ©2015 [employer]. This information is not intended to replace advice given to you by your health care provider. Make sure you discuss any questions you have with your health care provider. ° °

## 2013-12-31 NOTE — Progress Notes (Signed)
PICC line removed per MD order. Line removed without any issues or complications. Pressure directly applied to site after removal and pressure dressing with Vaseline gauze applied to site. Catheter length verified with second RN, Mayra Reel as 39 inches. Patient observed 30 minutes after line removed - pt laid in flat position. VS taken and charted. Pt discharged home and given copy of AVS with PICC removal after care instructions and pt educated to keep pressure dressing on site for 24 hours. Patient agreeable to this.

## 2014-01-01 DIAGNOSIS — Z299 Encounter for prophylactic measures, unspecified: Secondary | ICD-10-CM | POA: Insufficient documentation

## 2014-01-01 NOTE — Assessment & Plan Note (Signed)
This is likely related to the recent Neulasta injection and necrotic tumor on his neck. It is improving. He has no signs to suggest infection.

## 2014-01-01 NOTE — Assessment & Plan Note (Signed)
This is better.  He is weaning himself off pain medicine.

## 2014-01-01 NOTE — Progress Notes (Signed)
[facility] OFFICE PROGRESS NOTE  Patient Care Team: [name], MD as PCP - General (Hematology and Oncology) Brooks Sailors, RN as Oncology Nurse Navigator  SUMMARY OF ONCOLOGIC HISTORY: Oncology History   Cancer of base of tongue, HPV negative   Primary site: Pharynx - Oropharynx   Staging method: AJCC 7th Edition   Clinical: Stage IVB (T2, N3, M0) signed by [name], MD on 10/15/2013 10:25 AM   Summary: Stage IVB (T2, N3, M0)       Cancer of base of tongue   09/17/2013 Imaging CT scan of the neck showed large bilateral neck lymphadenopathy with associated tongue mass.   09/23/2013 Procedure Fine needle aspirate of the left neck mass came back positive for squamous cell carcinoma.   10/04/2013 Surgery Laryngoscopy showed a firm mass within the left tongue base extending past the midline and encompassing much of the tongue base.  There is extension of the mass into the vallecula and on to the lingual surface of the epiglottis.   10/04/2013 Pathology Results Vallecula and tongue biopsy confirmed squamous cell carcinoma.   10/15/2013 Imaging PET/CT scan showed no evidence of distant metastatic disease apart from just regional lymph node involvement.   10/25/2013 - 11/01/2013 Hospital Admission    10/27/2013 - 12/13/2013 Chemotherapy He was given 3 cycles of induction chemo with cisplatin, taxotere, 5 FU   11/03/2013 - 11/07/2013 Hospital Admission He was hospitalized for dehydration   12/31/2013 Procedure PICC line was removed    INTERVAL HISTORY: Please see below for problem oriented charting. He is seen for his weekly appointment for supportive care. The patient had quit smoking. He continues daily wound dressing. He denies fevers or chills. His back pain has improved.  REVIEW OF SYSTEMS:   Constitutional: Denies fevers, chills or abnormal weight loss Eyes: Denies blurriness of vision Ears, nose, mouth, throat, and face: Denies mucositis or sore throat Respiratory: Denies  cough, dyspnea or wheezes Cardiovascular: Denies palpitation, chest discomfort or lower extremity swelling Gastrointestinal:  Denies nausea, heartburn or change in bowel habits Skin: Denies abnormal skin rashes Lymphatics: Denies new lymphadenopathy or easy bruising Neurological:Denies numbness, tingling or new weaknesses Behavioral/Psych: Mood is stable, no new changes  All other systems were reviewed with the patient and are negative.  I have reviewed the past medical history, past surgical history, social history and family history with the patient and they are unchanged from previous note.  ALLERGIES:  has No Known Allergies.  MEDICATIONS:  Current Outpatient Prescriptions  Medication Sig Dispense Refill  . acetaminophen (TYLENOL) 325 MG tablet Take 650 mg by mouth every 6 (six) hours as needed for mild pain or moderate pain.      Marland Kitchen albuterol (PROVENTIL) (2.5 MG/3ML) 0.083% nebulizer solution Take 3 mLs (2.5 mg total) by nebulization every 6 (six) hours as needed for wheezing or shortness of breath.  75 mL  12  . antiseptic oral rinse (BIOTENE) LIQD 15 mLs by Mouth Rinse route 2 (two) times daily.  1 Bottle  2  . chlorhexidine (PERIDEX) 0.12 % solution Rinse with 15 mls twice daily for 30 seconds. Use after breakfast and at bedtime. Spit out excess. Do not swallow.  480 mL  prn  . dexamethasone (DECADRON) 4 MG tablet Start the day before Taxotere. Take once the day after, then 2 times a day x 2 days. Repeat in 3 weeks  30 tablet  1  . feeding supplement, ENSURE COMPLETE, (ENSURE COMPLETE) LIQD Take 237 mLs by mouth  2 (two) times daily between meals.      . lactulose ([place]) 10 GM/15ML solution Take 15 mLs (10 g total) by mouth 2 (two) times daily as needed for mild constipation.  240 mL  2  . magnesium oxide (MAG-OX) 400 (241.3 MG) MG tablet Take 1 tablet (400 mg total) by mouth daily.  60 tablet  1  . morphine (MS CONTIN) 15 MG 12 hr tablet Take 1 tablet (15 mg total) by mouth every  12 (twelve) hours.  60 tablet  0  . Morphine Sulfate (MORPHINE CONCENTRATE) 10 mg / 0.5 ml concentrated solution Take 0.25 mLs (5 mg total) by mouth every 2 (two) hours as needed for severe pain.  60 mL  0  . pantoprazole (PROTONIX) 40 MG tablet Take 1 tablet (40 mg total) by mouth daily.  30 tablet  2  . polyethylene glycol (MIRALAX / GLYCOLAX) packet Take 17 g by mouth daily.  30 each  2  . senna-docusate (SENOKOT-S) 8.6-50 MG per tablet Take 2 tablets by mouth 2 (two) times daily.  120 tablet  0  . temazepam (RESTORIL) 15 MG capsule TAKE 1 CAPSULE BY MOUTH AT BEDTIME AS NEEDED FOR SLEEP  30 capsule  0   No current facility-administered medications for this visit.    PHYSICAL EXAMINATION: ECOG PERFORMANCE STATUS: 0 - Asymptomatic  Filed Vitals:   12/31/13 1456  BP: 115/71  Pulse: 82  Temp: 98 F (36.7 C)  Resp: 18   Filed Weights   12/31/13 1456  Weight: 124 lb 11.2 oz (56.564 kg)    GENERAL:alert, no distress and comfortable SKIN: He has persistent necrotic wound and an opening measured approximately 1 inch across. The drainage has reduced dramatically. I changed his dressing after I expressed approximately half a cup of pus. EYES: normal, Conjunctiva are pink and non-injected, sclera clear OROPHARYNX:no exudate, no erythema and lips, buccal mucosa, and tongue normal  NECK: supple, thyroid normal size, non-tender, without nodularity LYMPH:  no palpable lymphadenopathy in the cervical, axillary or inguinal LUNGS: clear to auscultation and percussion with normal breathing effort HEART: regular rate & rhythm and no murmurs and no lower extremity edema ABDOMEN:abdomen soft, non-tender and normal bowel sounds Musculoskeletal:no cyanosis of digits and no clubbing  NEURO: alert & oriented x 3 with fluent speech, no focal motor/sensory deficits  LABORATORY DATA:  I have reviewed the data as listed    Component Value Date/Time   NA 138 12/31/2013 1447   NA 133* 11/06/2013 0445   K  4.2 12/31/2013 1447   K 3.5* 11/06/2013 0445   CL 98 11/06/2013 0445   CO2 25 12/31/2013 1447   CO2 21 11/06/2013 0445   GLUCOSE 88 12/31/2013 1447   GLUCOSE 99 11/06/2013 0445   BUN 12.8 12/31/2013 1447   BUN 11 11/06/2013 0445   CREATININE 0.7 12/31/2013 1447   CREATININE 0.63 11/06/2013 0445   CALCIUM 8.6 12/31/2013 1447   CALCIUM 7.0* 11/06/2013 0445   PROT 5.7* 12/31/2013 1447   PROT 5.6* 11/03/2013 2220   ALBUMIN 3.2* 12/31/2013 1447   ALBUMIN 2.5* 11/03/2013 2220   AST 11 12/31/2013 1447   AST 7 11/03/2013 2220   ALT 7 12/31/2013 1447   ALT 9 11/03/2013 2220   ALKPHOS 53 12/31/2013 1447   ALKPHOS 53 11/03/2013 2220   BILITOT <0.20 12/31/2013 1447   BILITOT 0.6 11/03/2013 2220   GFRNONAA >90 11/06/2013 0445   GFRAA >90 11/06/2013 0445    No results found for this  basename: SPEP, UPEP,  kappa and lambda light chains    Lab Results  Component Value Date   WBC 13.2* 12/31/2013   NEUTROABS 8.7* 12/31/2013   HGB 9.6* 12/31/2013   HCT 29.9* 12/31/2013   MCV 104.1* 12/31/2013   PLT 481* 12/31/2013      Chemistry      Component Value Date/Time   NA 138 12/31/2013 1447   NA 133* 11/06/2013 0445   K 4.2 12/31/2013 1447   K 3.5* 11/06/2013 0445   CL 98 11/06/2013 0445   CO2 25 12/31/2013 1447   CO2 21 11/06/2013 0445   BUN 12.8 12/31/2013 1447   BUN 11 11/06/2013 0445   CREATININE 0.7 12/31/2013 1447   CREATININE 0.63 11/06/2013 0445      Component Value Date/Time   CALCIUM 8.6 12/31/2013 1447   CALCIUM 7.0* 11/06/2013 0445   ALKPHOS 53 12/31/2013 1447   ALKPHOS 53 11/03/2013 2220   AST 11 12/31/2013 1447   AST 7 11/03/2013 2220   ALT 7 12/31/2013 1447   ALT 9 11/03/2013 2220   BILITOT <0.20 12/31/2013 1447   BILITOT 0.6 11/03/2013 2220      ASSESSMENT & PLAN:  Cancer of base of tongue He has remarkable response to chemotherapy. Continue aggressive supportive care. The patient is encouraged. PET/CT scan has been ordered to be done within 2 weeks. I will present his case at the next tumor Board. I will proceed to remove his PICC line  today as it is no longer needed.     Anemia in neoplastic disease Recent workup such as anemia chronic disease. He is not symptomatic. Recommend observation only. I would transfuse if hemoglobin dropped to less than 8 g.    Weight loss He has not lost weight and the serum albumin is improving. I continue to encourage the patient to increase oral intake as tolerated..        Neck pain This is better.  He is weaning himself off pain medicine.  Leukocytosis, unspecified This is likely related to the recent Neulasta injection and necrotic tumor on his neck. It is improving. He has no signs to suggest infection.          Thrombocytosis This is due to recent treatment. Monitor closely. It is improving.       Wound discharge Continue daily wound dressing. The skin surrounding the wound does not appear to be infected. The external wound appears to be getting smaller.            Preventive measure We discussed the importance of preventive care and reviewed the vaccination programs. He does not have any prior allergic reactions to influenza vaccination. He agrees to proceed with influenza vaccination today and we will administer it today at the clinic.    Orders Placed This Encounter  Procedures  . PICC line removal    Standing Status: Standing     Number of Occurrences: 1     Standing Expiration Date:    All questions were answered. The patient knows to call the clinic with any problems, questions or concerns. No barriers to learning was detected. I spent 30 minutes counseling the patient face to face. The total time spent in the appointment was 40 minutes and more than 50% was on counseling and review of test results     [facility], [place], MD 01/01/2014 3:15 PM

## 2014-01-01 NOTE — Assessment & Plan Note (Signed)
Continue daily wound dressing. The skin surrounding the wound does not appear to be infected. The external wound appears to be getting smaller.

## 2014-01-01 NOTE — Assessment & Plan Note (Signed)
He has remarkable response to chemotherapy. Continue aggressive supportive care. The patient is encouraged. PET/CT scan has been ordered to be done within 2 weeks. I will present his case at the next tumor Board. I will proceed to remove his PICC line today as it is no longer needed.

## 2014-01-01 NOTE — Assessment & Plan Note (Signed)
Recent workup such as anemia chronic disease. He is not symptomatic. Recommend observation only. I would transfuse if hemoglobin dropped to less than 8 g.

## 2014-01-01 NOTE — Assessment & Plan Note (Signed)
We discussed the importance of preventive care and reviewed the vaccination programs. He does not have any prior allergic reactions to influenza vaccination. He agrees to proceed with influenza vaccination today and we will administer it today at the clinic.  

## 2014-01-01 NOTE — Assessment & Plan Note (Signed)
This is due to recent treatment. Monitor closely. It is improving.

## 2014-01-01 NOTE — Assessment & Plan Note (Signed)
He has not lost weight and the serum albumin is improving. I continue to encourage the patient to increase oral intake as tolerated.Marland Kitchen

## 2014-01-04 ENCOUNTER — Telehealth: Payer: Self-pay | Admitting: Hematology and Oncology

## 2014-01-04 NOTE — Telephone Encounter (Signed)
Pt confirmed ov per 09/04 POF, mailed pt sch...Marland KitchenMarland KitchenKJ

## 2014-01-10 ENCOUNTER — Ambulatory Visit (HOSPITAL_COMMUNITY)
Admission: RE | Admit: 2014-01-10 | Discharge: 2014-01-10 | Disposition: A | Discharge status: Home / Self Care | Payer: Medicaid Other | Source: Ambulatory Visit | Attending: Hematology and Oncology | Admitting: Hematology and Oncology

## 2014-01-10 DIAGNOSIS — C01 Malignant neoplasm of base of tongue: Secondary | ICD-10-CM | POA: Diagnosis not present

## 2014-01-10 LAB — GLUCOSE, CAPILLARY: GLUCOSE-CAPILLARY: 93 mg/dL (ref 70–99)

## 2014-01-10 MED ORDER — FLUDEOXYGLUCOSE F - 18 (FDG) INJECTION
6.1000 | Freq: Once | INTRAVENOUS | Status: AC | PRN
  Administered 2014-01-10: 6.1 via INTRAVENOUS

## 2014-01-13 ENCOUNTER — Encounter: Payer: Self-pay | Admitting: Hematology and Oncology

## 2014-01-13 ENCOUNTER — Ambulatory Visit (HOSPITAL_BASED_OUTPATIENT_CLINIC_OR_DEPARTMENT_OTHER): Payer: Medicaid Other | Admitting: Hematology and Oncology

## 2014-01-13 ENCOUNTER — Telehealth: Payer: Self-pay | Admitting: Hematology and Oncology

## 2014-01-13 VITALS — BP 135/71 | HR 86 | Temp 98.3°F | Resp 17 | Ht 65.0 in | Wt 124.9 lb

## 2014-01-13 DIAGNOSIS — M542 Cervicalgia: Secondary | ICD-10-CM

## 2014-01-13 DIAGNOSIS — C01 Malignant neoplasm of base of tongue: Secondary | ICD-10-CM

## 2014-01-13 DIAGNOSIS — T148XXA Other injury of unspecified body region, initial encounter: Secondary | ICD-10-CM

## 2014-01-13 NOTE — Assessment & Plan Note (Signed)
I have presented his case at the tumor Board yesterday. Overall consensus would be to proceed with radiation treatment. In the meantime, I will continue to provide supportive care.

## 2014-01-13 NOTE — Assessment & Plan Note (Signed)
This is better.  He is weaning himself off pain medicine.

## 2014-01-13 NOTE — Progress Notes (Signed)
[facility] OFFICE PROGRESS NOTE  Patient Care Team: [name], MD as PCP - General (Hematology and Oncology) Brooks Sailors, RN as Oncology Nurse Navigator  SUMMARY OF ONCOLOGIC HISTORY: Oncology History   Cancer of base of tongue, HPV negative   Primary site: Pharynx - Oropharynx   Staging method: AJCC 7th Edition   Clinical: Stage IVB (T2, N3, M0) signed by [name], MD on 10/15/2013 10:25 AM   Summary: Stage IVB (T2, N3, M0)       Cancer of base of tongue   09/17/2013 Imaging CT scan of the neck showed large bilateral neck lymphadenopathy with associated tongue mass.   09/23/2013 Procedure Fine needle aspirate of the left neck mass came back positive for squamous cell carcinoma.   10/04/2013 Surgery Laryngoscopy showed a firm mass within the left tongue base extending past the midline and encompassing much of the tongue base.  There is extension of the mass into the vallecula and on to the lingual surface of the epiglottis.   10/04/2013 Pathology Results Vallecula and tongue biopsy confirmed squamous cell carcinoma.   10/15/2013 Imaging PET/CT scan showed no evidence of distant metastatic disease apart from just regional lymph node involvement.   10/25/2013 - 11/01/2013 Hospital Admission    10/27/2013 - 12/13/2013 Chemotherapy He was given 3 cycles of induction chemo with cisplatin, taxotere, 5 FU   11/03/2013 - 11/07/2013 Hospital Admission He was hospitalized for dehydration   12/31/2013 Procedure PICC line was removed   01/10/2014 Imaging PET Ct scan showed near complete response to treatment    INTERVAL HISTORY: Please see below for problem oriented charting. He returns today to review test results. He felt that that wound discharge from his neck is getting smaller. The patient has not smoked recently.  REVIEW OF SYSTEMS:   Constitutional: Denies fevers, chills or abnormal weight loss Eyes: Denies blurriness of vision Ears, nose, mouth, throat, and face: Denies  mucositis or sore throat Respiratory: Denies cough, dyspnea or wheezes Cardiovascular: Denies palpitation, chest discomfort or lower extremity swelling Gastrointestinal:  Denies nausea, heartburn or change in bowel habits Skin: Denies abnormal skin rashes Lymphatics: Denies new lymphadenopathy or easy bruising Neurological:Denies numbness, tingling or new weaknesses Behavioral/Psych: Mood is stable, no new changes  All other systems were reviewed with the patient and are negative.  I have reviewed the past medical history, past surgical history, social history and family history with the patient and they are unchanged from previous note.  ALLERGIES:  has No Known Allergies.  MEDICATIONS:  Current Outpatient Prescriptions  Medication Sig Dispense Refill  . acetaminophen (TYLENOL) 325 MG tablet Take 650 mg by mouth every 6 (six) hours as needed for mild pain or moderate pain.      Marland Kitchen antiseptic oral rinse (BIOTENE) LIQD 15 mLs by Mouth Rinse route 2 (two) times daily.  1 Bottle  2  . chlorhexidine (PERIDEX) 0.12 % solution Rinse with 15 mls twice daily for 30 seconds. Use after breakfast and at bedtime. Spit out excess. Do not swallow.  480 mL  prn  . lactulose (CHRONULAC) 10 GM/15ML solution Take 15 mLs (10 g total) by mouth 2 (two) times daily as needed for mild constipation.  240 mL  2  . magnesium oxide (MAG-OX) 400 (241.3 MG) MG tablet Take 1 tablet (400 mg total) by mouth daily.  60 tablet  1  . morphine (MS CONTIN) 15 MG 12 hr tablet Take 1 tablet (15 mg total) by mouth every 12 (twelve)  hours.  60 tablet  0  . Morphine Sulfate (MORPHINE CONCENTRATE) 10 mg / 0.5 ml concentrated solution Take 0.25 mLs (5 mg total) by mouth every 2 (two) hours as needed for severe pain.  60 mL  0  . pantoprazole (PROTONIX) 40 MG tablet Take 1 tablet (40 mg total) by mouth daily.  30 tablet  2  . polyethylene glycol (MIRALAX / GLYCOLAX) packet Take 17 g by mouth daily.  30 each  2  . senna-docusate  (SENOKOT-S) 8.6-50 MG per tablet Take 2 tablets by mouth 2 (two) times daily.  120 tablet  0  . temazepam (RESTORIL) 15 MG capsule TAKE 1 CAPSULE BY MOUTH AT BEDTIME AS NEEDED FOR SLEEP  30 capsule  0  . albuterol (PROVENTIL) (2.5 MG/3ML) 0.083% nebulizer solution Take 3 mLs (2.5 mg total) by nebulization every 6 (six) hours as needed for wheezing or shortness of breath.  75 mL  12   No current facility-administered medications for this visit.    PHYSICAL EXAMINATION: ECOG PERFORMANCE STATUS: 0 - Asymptomatic  Filed Vitals:   01/13/14 1253  BP: 135/71  Pulse: 86  Temp: 98.3 F (36.8 C)  Resp: 17   Filed Weights   01/13/14 1253  Weight: 124 lb 14.4 oz (56.654 kg)    GENERAL:alert, no distress and comfortable SKIN: His neck wound discharge is improving slowly. I expressed a small tea spoon of pus and applied a clean bandage on it EYES: normal, Conjunctiva are pink and non-injected, sclera clear OROPHARYNX:no exudate, no erythema and lips, buccal mucosa, and tongue normal  Musculoskeletal:no cyanosis of digits and no clubbing  NEURO: alert & oriented x 3 with fluent speech, no focal motor/sensory deficits  LABORATORY DATA:  I have reviewed the data as listed    Component Value Date/Time   NA 138 12/31/2013 1447   NA 133* 11/06/2013 0445   K 4.2 12/31/2013 1447   K 3.5* 11/06/2013 0445   CL 98 11/06/2013 0445   CO2 25 12/31/2013 1447   CO2 21 11/06/2013 0445   GLUCOSE 88 12/31/2013 1447   GLUCOSE 99 11/06/2013 0445   BUN 12.8 12/31/2013 1447   BUN 11 11/06/2013 0445   CREATININE 0.7 12/31/2013 1447   CREATININE 0.63 11/06/2013 0445   CALCIUM 8.6 12/31/2013 1447   CALCIUM 7.0* 11/06/2013 0445   PROT 5.7* 12/31/2013 1447   PROT 5.6* 11/03/2013 2220   ALBUMIN 3.2* 12/31/2013 1447   ALBUMIN 2.5* 11/03/2013 2220   AST 11 12/31/2013 1447   AST 7 11/03/2013 2220   ALT 7 12/31/2013 1447   ALT 9 11/03/2013 2220   ALKPHOS 53 12/31/2013 1447   ALKPHOS 53 11/03/2013 2220   BILITOT <0.20 12/31/2013 1447   BILITOT 0.6  11/03/2013 2220   GFRNONAA >90 11/06/2013 0445   GFRAA >90 11/06/2013 0445    No results found for this basename: SPEP, UPEP,  kappa and lambda light chains    Lab Results  Component Value Date   WBC 13.2* 12/31/2013   NEUTROABS 8.7* 12/31/2013   HGB 9.6* 12/31/2013   HCT 29.9* 12/31/2013   MCV 104.1* 12/31/2013   PLT 481* 12/31/2013      Chemistry      Component Value Date/Time   NA 138 12/31/2013 1447   NA 133* 11/06/2013 0445   K 4.2 12/31/2013 1447   K 3.5* 11/06/2013 0445   CL 98 11/06/2013 0445   CO2 25 12/31/2013 1447   CO2 21 11/06/2013 0445   BUN 12.8  12/31/2013 1447   BUN 11 11/06/2013 0445   CREATININE 0.7 12/31/2013 1447   CREATININE 0.63 11/06/2013 0445      Component Value Date/Time   CALCIUM 8.6 12/31/2013 1447   CALCIUM 7.0* 11/06/2013 0445   ALKPHOS 53 12/31/2013 1447   ALKPHOS 53 11/03/2013 2220   AST 11 12/31/2013 1447   AST 7 11/03/2013 2220   ALT 7 12/31/2013 1447   ALT 9 11/03/2013 2220   BILITOT <0.20 12/31/2013 1447   BILITOT 0.6 11/03/2013 2220      ASSESSMENT & PLAN:  Cancer of base of tongue I have presented his case at the tumor Board yesterday. Overall consensus would be to proceed with radiation treatment. In the meantime, I will continue to provide supportive care.  Neck pain This is better.  He is weaning himself off pain medicine.  Wound discharge Continue daily wound dressing. The skin surrounding the wound does not appear to be infected. The external wound appears to be getting smaller.    All questions were answered. The patient knows to call the clinic with any problems, questions or concerns. No barriers to learning was detected. I spent 25 minutes counseling the patient face to face. The total time spent in the appointment was 30 minutes and more than 50% was on counseling and review of test results     [facility], [place], MD 01/13/2014 8:07 PM

## 2014-01-13 NOTE — Assessment & Plan Note (Signed)
Continue daily wound dressing. The skin surrounding the wound does not appear to be infected. The external wound appears to be getting smaller.

## 2014-01-13 NOTE — Telephone Encounter (Signed)
Pt confirmed labs/ov per 09/17 POF, gave pt AVS...KJ °

## 2014-01-14 ENCOUNTER — Telehealth: Payer: Self-pay | Admitting: Hematology and Oncology

## 2014-01-14 NOTE — Progress Notes (Signed)
Head and Neck Cancer Location of Tumor / Histology:Invasive Squamous Cell Carcinoma of the Base of Tongue  Patient presented to Dr. Redmond Baseman on 09/23/13 concerning a steadily increasing mass with drainage(lower part) on the left side of his neck for ~ 6-8 weeks prior to this visit. He reported "soreness of the lower throat, increased difficulty swallowing, clearing throat frequently, and mild hoarseness in the am.    Currently has a necrotic tumor on his neck - Daily wound dressing    CT of Neck from 09/17/13 Reveals"  There is a large, centrally necrotic mass deep to the left  sternocleidomastoid spanning levels II and III and measuring 6.6 x  5.5 x 6.2 cm (AP by transverse by craniocaudal). Finding is  compatible with a necrotic nodal mass. Necrotic left level II lymph  node superior to this dominant mass measures 2.5 x 2.2 cm. Necrotic  right level II lymph nodes measure up to 2.2 x 1.9 cm in size. Right  level III lymph nodes measure up to 6 mm and short axis. A mildly  asymmetric left supraclavicular lymph node measures 6 mm.  There is a large enhancing mass in the tongue base measuring 3.7 x  3.5 x 2.9 cm (transverse by AP by craniocaudal). Mass extends into  the vallecula and superior aspect of the pre epiglottic space.   There is a large enhancing mass in the tongue base measuring 3.7 x  3.5 x 2.9 cm (transverse by AP by craniocaudal). Mass extends into  the vallecula and superior aspect of the pre epiglottic space.   10/04/13  Diagnosis  1. Tongue, biopsy, left base  - INVASIVE SQUAMOUS CELL CARCINOMA.  - LYMPHOVASCULAR INVASION IS IDENTIFIED.  - SEE COMMENT.  2. Oropharynx, biopsy, Vallecula  - INVASIVE SQUAMOUS CELL CARCINOMA  09/23/13  Diagnosis  NECK, FINE NEEDLE ASPIRATION, LEFT, (SPECIMEN 1 OF 1 COLLECTED ON 09/23/13):  MALIGNANT CELLS PRESENT SEE COMMENT  COMMENT: THE MALIGNANT CELLS ARE CONSISTENT WITH SQUAMOUS CELL CARCINOMA. THE  CASE WAS REVIEWED WITH DR HILLARD WHO  CONCURS.    Nutrition Status:  Weight changes:  Swallowing status: no painful swallowing, soft easy diet Plans, if any, for PEG tube: no   Tobacco/Marijuana/Snuff/ETOH use: 2 PPD x 40 years, former Heavy alcohol drinker, Use of Marijuana   Past/Anticipated interventions by otolaryngology, if any: Biopsy by Dr. Redmond Baseman   Past/Anticipated interventions by medical oncology, if any: Dr. [name] - He was given 3 cycles of induction chemo with cisplatin, taxotere, 5 FU from 10/27/13 - 12/13/13    Referrals yet, to any of the following?  Social Work? 10/14/15  Dentistry? 10/18/13 - all teeth removed  Swallowing therapy Nutrition? 10/13/13  Med/Onc? Dr.Gorsuch 10/05/13  PEG placement: no  SAFETY ISSUES:  Prior radiation? No  Pacemaker/ICD? No  Possible current pregnancy? N/A  Is the patient on methotrexate? No   Current Complaints: Pt reports he continues to smoke.  He is eating a healthy soft diet.  He reports pain (rated 5) over his left shoulder when lifting his left arm. Mucus membranes moist, noted small amount of dried yellow exudate on tongue. PERRLA

## 2014-01-14 NOTE — Telephone Encounter (Signed)
s.w. pt and advised on Sept appt pt ok and aware °

## 2014-01-17 ENCOUNTER — Ambulatory Visit
Admission: RE | Admit: 2014-01-17 | Discharge: 2014-01-17 | Disposition: A | Discharge status: Home / Self Care | Payer: Medicaid Other | Source: Ambulatory Visit | Attending: Radiation Oncology | Admitting: Radiation Oncology

## 2014-01-17 ENCOUNTER — Encounter: Payer: Self-pay | Admitting: Radiation Oncology

## 2014-01-17 ENCOUNTER — Encounter: Payer: Self-pay | Admitting: *Deleted

## 2014-01-17 ENCOUNTER — Telehealth: Payer: Self-pay | Admitting: *Deleted

## 2014-01-17 ENCOUNTER — Encounter: Payer: Self-pay | Admitting: Hematology and Oncology

## 2014-01-17 VITALS — BP 144/101 | HR 108 | Temp 98.0°F | Resp 12 | Wt 125.7 lb

## 2014-01-17 DIAGNOSIS — Z9089 Acquired absence of other organs: Secondary | ICD-10-CM | POA: Insufficient documentation

## 2014-01-17 DIAGNOSIS — C779 Secondary and unspecified malignant neoplasm of lymph node, unspecified: Secondary | ICD-10-CM | POA: Insufficient documentation

## 2014-01-17 DIAGNOSIS — C01 Malignant neoplasm of base of tongue: Secondary | ICD-10-CM | POA: Diagnosis not present

## 2014-01-17 DIAGNOSIS — G4700 Insomnia, unspecified: Secondary | ICD-10-CM | POA: Insufficient documentation

## 2014-01-17 DIAGNOSIS — K5900 Constipation, unspecified: Secondary | ICD-10-CM | POA: Diagnosis not present

## 2014-01-17 DIAGNOSIS — Z510 Encounter for antineoplastic radiation therapy: Secondary | ICD-10-CM | POA: Insufficient documentation

## 2014-01-17 DIAGNOSIS — Z87891 Personal history of nicotine dependence: Secondary | ICD-10-CM | POA: Diagnosis not present

## 2014-01-17 DIAGNOSIS — Z79899 Other long term (current) drug therapy: Secondary | ICD-10-CM | POA: Diagnosis not present

## 2014-01-17 MED ORDER — TEMAZEPAM 15 MG PO CAPS: ORAL_CAPSULE | ORAL | Status: DC

## 2014-01-17 MED ORDER — PROCHLORPERAZINE MALEATE 10 MG PO TABS: 10.0000 mg | ORAL_TABLET | Freq: Four times a day (QID) | ORAL | Status: DC | PRN

## 2014-01-17 MED ORDER — SODIUM CHLORIDE 0.9 % IJ SOLN
10.0000 mL | Freq: Once | INTRAMUSCULAR | Status: AC
  Administered 2014-01-17: 10 mL via INTRAVENOUS

## 2014-01-17 NOTE — Progress Notes (Signed)
Simulation, IMRT treatment planning note   Outpatient  Diagnosis: head and neck cancer   ICD-9-CM ICD-10-CM   1. Cancer of base of tongue 141.0 C01 Ambulatory Referral to Neuro Rehab     The patient was taken to the CT simulator and laid in the supine position on the table. An Aquaplast head and shoulder mask was custom fitted to the patient's anatomy. High-resolution CT axial imaging was obtained of the head and neck with contrast. I verified that the quality of the imaging is good for treatment planning. 1 Medically Necessary Treatment Device was fabricated and supervised by me: Aquaplast mask.   Treatment planning note I plan to treat the patient with helical Tomotherapy, IMRT. I plan to treat the patient's tumor bed and bilateral neck nodes. I plan to treat to a total dose of 70 Gray in 35  fractions   IMRT planning Note  IMRT is an important modality to deliver adequate dose to the patient's at risk tissues while sparing the patient's normal structures, including the: esophagus, parotid tissue, mandible, brain stem, spinal cord, oral cavity, brachial plexus.  This justifies the use of IMRT in the patient's treatment.    -----------------------------------  Eppie Gibson, MD

## 2014-01-17 NOTE — Progress Notes (Addendum)
Vital signs taken and documented today on the New Consult encounter. IV start in right ventral forearm with a 22 gauge angiocath with only one attempt.  Brisk blood return.  Catheter secured with tape and clear opsite dressing.  Patient for simulation today with IV contrast.  He denies any prior contrast reaction, and does not have diabetes.  BUN 12.8 and Creat,. 0.7 on 12/31/13.  Patient accompanied by his spouse.  Waiting for simulation for 1pm today.

## 2014-01-17 NOTE — Telephone Encounter (Signed)
CALLED PATIENT TO INFORM OF APPT. WITH Angel [name] ON 01-24-14- ARRIVAL TIME - 3:15 PM, SPOKE WITH PATIENT AND HE IS AWARE OF THIS APPT.

## 2014-01-17 NOTE — Progress Notes (Addendum)
Radiation Oncology         [phone]) [phone] ________________________________  Outpatient Followup  Name: Angel [name] MRN: [id number]  Date: 01/17/2014  DOB: [birth year]  HE:RDEYCXK, NI, MD  Melida Quitter, MD   REFERRING PHYSICIAN: Melida Quitter, MD  DIAGNOSIS:  T2N3M0 IVB squamous cell carcinoma, base of tongue  HISTORY OF PRESENT ILLNESS::Angel [name] is a 64 y.o. male who returns for followup today. Since consultation, staging PET ruled out distant metastases.  He  was given 3 cycles of induction chemo with cisplatin, taxotere, 5 FU with partial and substantial clinical response.  Restaging PET from 01-10-14 was reviewed last week at tumor board; demonstrated esolution of hypermetabolic activity at the base of the tongue. The left and right hypermetabolic lymph nodes are significantly decreased in size but they remain hypermetabolic consistent residual active carcinoma.  No evidence of distant metastatic disease. Consensus was to proceed with RT.  Dr Elson Areas will provide continued supportive care.  Large L neck node has been drained in her clinic over the last few cycles of chemotherapy. Drainage tapering off, now, to scant fluid.  He has cut down substantially on cigarettes. Swallows soft food.  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    PAST SURGICAL HISTORY: Past Surgical History  Procedure Laterality Date  . Testicle surgery      as a infant  . Appendectomy    . Mandible surgery      from [place]  . Back surgery      Lumbar  . Tonsillectomy    . Panendoscopy N/A 10/04/2013    Procedure: Direct Laryngoscopy  WITH BIOPSY;  Surgeon: Melida Quitter, MD;  Location: [facility];  Service: ENT;  Laterality: N/A;  . Esophagoscopy  10/04/2013      Procedure: ESOPHAGOSCOPY;  Surgeon: Melida Quitter, MD;  Location: [facility];  Service: ENT;;  . Multiple extractions with alveoloplasty N/A 10/08/2013    Procedure: extraction of tooth #'s 2,3,4,5,6,11,12,13,14,15,18,19,20,21,22,23,24,25,26, 27,28, 29, 31 with alveoloplasty ;  Surgeon: Lenn Cal, DDS;  Location: [facility];  Service: Oral Surgery;  Laterality: N/A;    FAMILY HISTORY: family history includes Cancer in his father and mother.  SOCIAL HISTORY:  reports that he has quit smoking. His smoking use included Cigarettes. He has a 90 pack-year smoking history. He has never used smokeless tobacco. He reports that he uses illicit drugs (Marijuana). He reports that he does not drink alcohol.  ALLERGIES: Review of patient's allergies indicates no known allergies.  MEDICATIONS:  Current Outpatient Prescriptions  Medication Sig Dispense Refill  . acetaminophen (TYLENOL) 325 MG tablet Take 650 mg by mouth every 6 (six) hours as needed for mild pain or moderate pain.      Marland Kitchen albuterol (PROVENTIL) (2.5 MG/3ML) 0.083% nebulizer solution Take 3 mLs (2.5 mg total) by nebulization every 6 (six) hours as needed for wheezing or shortness of breath.  75 mL  12  . antiseptic oral rinse (BIOTENE) LIQD 15 mLs by Mouth Rinse route 2 (two) times daily.  1 Bottle  2  . chlorhexidine (PERIDEX) 0.12 % solution Rinse with 15 mls twice daily for 30 seconds. Use after breakfast and at bedtime. Spit out excess. Do not swallow.  480 mL  prn  . lactulose (CHRONULAC) 10 GM/15ML solution Take 15 mLs (10  g total) by mouth 2 (two) times daily as needed for mild constipation.  240 mL  2  . magnesium oxide (MAG-OX) 400 (241.3 MG) MG tablet Take 1 tablet (400 mg total) by mouth daily.  60 tablet  1  . morphine (MS CONTIN) 15 MG 12 hr tablet Take 1 tablet (15 mg total) by mouth every 12 (twelve) hours.  60 tablet  0  . Morphine Sulfate (MORPHINE CONCENTRATE) 10 mg / 0.5 ml concentrated solution Take 0.25 mLs (5 mg total) by  mouth every 2 (two) hours as needed for severe pain.  60 mL  0  . pantoprazole (PROTONIX) 40 MG tablet Take 1 tablet (40 mg total) by mouth daily.  30 tablet  2  . polyethylene glycol (MIRALAX / GLYCOLAX) packet Take 17 g by mouth daily.  30 each  2  . prochlorperazine (COMPAZINE) 10 MG tablet Take 1 tablet (10 mg total) by mouth every 6 (six) hours as needed for nausea or vomiting.  60 tablet  3  . senna-docusate (SENOKOT-S) 8.6-50 MG per tablet Take 2 tablets by mouth 2 (two) times daily.  120 tablet  0  . temazepam (RESTORIL) 15 MG capsule TAKE 1 CAPSULE BY MOUTH AT BEDTIME AS NEEDED FOR SLEEP  30 capsule  0   No current facility-administered medications for this encounter.    REVIEW OF SYSTEMS:  Notable for that above.   PHYSICAL EXAM:  weight is 125 lb 11.2 oz (57.017 kg). His oral temperature is 98 F (36.7 C). His blood pressure is 144/101 and his pulse is 108. His respiration is 12 and oxygen saturation is 100%.   Oropharyngeal mucosa is intact with no thrush or lesions.  Tobacco stains on tongue.  Neck notable for marked decrease in size of left level III mass with open wound, draining scant whitish fluid.  ECOG = 2  0 - Asymptomatic (Fully active, able to carry on all predisease activities without restriction)  1 - Symptomatic but completely ambulatory (Restricted in physically strenuous activity but ambulatory and able to carry out work of a light or sedentary nature. For example, light housework, office work)  2 - Symptomatic, <50% in bed during the day (Ambulatory and capable of all self care but unable to carry out any work activities. Up and about more than 50% of waking hours)  3 - Symptomatic, >50% in bed, but not bedbound (Capable of only limited self-care, confined to bed or chair 50% or more of waking hours)  4 - Bedbound (Completely disabled. Cannot carry on any self-care. Totally confined to bed or chair)  5 - Death   Eustace Pen MM, Creech RH, Tormey DC, et al. [phone]).  "Toxicity and response criteria of the [facility] Group". [place] Oncol. 5 (6): 649-55   LABORATORY DATA:  Lab Results  Component Value Date   WBC 13.2* 12/31/2013   HGB 9.6* 12/31/2013   HCT 29.9* 12/31/2013   MCV 104.1* 12/31/2013   PLT 481* 12/31/2013   CMP     Component Value Date/Time   NA 138 12/31/2013 1447   NA 133* 11/06/2013 0445   K 4.2 12/31/2013 1447   K 3.5* 11/06/2013 0445   CL 98 11/06/2013 0445   CO2 25 12/31/2013 1447   CO2 21 11/06/2013 0445   GLUCOSE 88 12/31/2013 1447   GLUCOSE 99 11/06/2013 0445   BUN 12.8 12/31/2013 1447   BUN 11 11/06/2013 0445   CREATININE 0.7 12/31/2013 1447   CREATININE 0.63 11/06/2013  0445   CALCIUM 8.6 12/31/2013 1447   CALCIUM 7.0* 11/06/2013 0445   PROT 5.7* 12/31/2013 1447   PROT 5.6* 11/03/2013 2220   ALBUMIN 3.2* 12/31/2013 1447   ALBUMIN 2.5* 11/03/2013 2220   AST 11 12/31/2013 1447   AST 7 11/03/2013 2220   ALT 7 12/31/2013 1447   ALT 9 11/03/2013 2220   ALKPHOS 53 12/31/2013 1447   ALKPHOS 53 11/03/2013 2220   BILITOT <0.20 12/31/2013 1447   BILITOT 0.6 11/03/2013 2220   GFRNONAA >90 11/06/2013 0445   GFRAA >90 11/06/2013 0445         RADIOGRAPHY: Nm Pet Image Restag (ps) Skull Base To Thigh  01/10/2014   CLINICAL DATA:  Subsequent treatment strategy for head neck carcinoma. Carcinoma of the tongue of vallecula.  EXAM: NUCLEAR MEDICINE PET SKULL BASE TO THIGH  TECHNIQUE: 6.1 mCi F-18 FDG was injected intravenously. Full-ring PET imaging was performed from the skull base to thigh after the radiotracer. CT data was obtained and used for attenuation correction and anatomic localization.  FASTING BLOOD GLUCOSE:  Value: 94 mg/dl  COMPARISON:  PET-CT 10/15/2013 CT 11/04/2012  FINDINGS: NECK  Resolution of the metabolic activity base of the tongue.  There is interval decrease in size of the large necrotic is level 2 lymph node. This lymph node measures 23 mm decreased from 73 mm. This lymph node remains intensely hypermetabolic with SUV max 83.3 compared  to 14.5. There is a persistent hypermetabolic right level 2 lymph node which is also smaller measuring 12 mm (image 34, series 4) decreased from 23 mm on prior. The metabolic activity of this right level 2 lymph node is decreased with SUV maximal 4.8 decreased 10.1.  CHEST  No hypermetabolic mediastinal lymph nodes. There is increased metabolic activity associated with the right diaphragm noted on image 100 of fused series. On the CT scout the left hemidiaphragm is elevated. This is consistent with injury of the left phrenic nerve and left diaphragmatic paralysis. Additionally there is increased intracostal muscular activity suggesting some increased work of breathing.  There are no suspicious pulmonary nodules.  ABDOMEN/PELVIS  No abnormal hypermetabolic activity within the liver, pancreas, adrenal glands, or spleen. No hypermetabolic lymph nodes in the abdomen or pelvis.  SKELETON  No focal hypermetabolic activity to suggest skeletal metastasis.  IMPRESSION: 1. Resolution of hypermetabolic activity at the base of the tongue. 2. While the left and right hypermetabolic lymph nodes are significantly decreased in size they remain hypermetabolic consistent residual active carcinoma. 3. No evidence of distant metastatic disease. 4. 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 Some increased work of breathing noted.   Electronically Signed   By: Suzy Bouchard M.D.   On: 01/10/2014 11:11      IMPRESSION/PLAN:  We discussed the risks, benefits, and side effects of radiotherapy. Side effects may include but not necessarily be limited AS:[id number] soreness of the mouth and throat, changes in taste, changes in salivary function, skin irritation, hair loss, dehydration, weight loss and fatigue. We talked about late effects which include but are not necessarily limited to dysphagia, hypothyroidism, dry mouth, trismus, neck edema and nerve or spinal cord  injury No guarantees of treatment were given. A consent form was signed and placed in the patient's medical record. Simulation will occur today, treatment to start in about 9 days.  Neck wound is closing up. Patient understands that as discussed at tumor board, proceeding with RT is advised,  as the risks of tumor progression outweigh risks of a nonhealing wound. Tissue grafts could be considered in long term, if needed. Non adherent guaze provided today for wound.  Referral to swallowing therapist for dysphagia prevention.  The patient continues to use tobacco. The patient was counseled to stop using tobacco and was offered pharmacotherapy and further counseling to help with this. The patient declined pharmacotherapy but may try further counseling via [place] at this time.  >25 minutes of face to face time spent with patient, over 50% on counseling and coordination of care. __________________________________________   Eppie Gibson, MD

## 2014-01-17 NOTE — Addendum Note (Signed)
Encounter addended by: Deirdre Evener, RN on: 01/17/2014  6:39 PM<BR>     Documentation filed: Visit Diagnoses

## 2014-01-17 NOTE — Addendum Note (Signed)
Encounter addended by: Eppie Gibson, MD on: 01/17/2014  2:43 PM<BR>     Documentation filed: Flowsheet VN, Notes Section, Orders

## 2014-01-17 NOTE — Progress Notes (Signed)
Put disability form on nurse's desk. °

## 2014-01-18 ENCOUNTER — Telehealth: Payer: Self-pay | Admitting: *Deleted

## 2014-01-18 ENCOUNTER — Telehealth: Payer: Self-pay

## 2014-01-18 ENCOUNTER — Other Ambulatory Visit: Payer: Self-pay | Admitting: Radiation Oncology

## 2014-01-18 ENCOUNTER — Encounter: Payer: Self-pay | Admitting: Hematology and Oncology

## 2014-01-18 DIAGNOSIS — C01 Malignant neoplasm of base of tongue: Secondary | ICD-10-CM

## 2014-01-18 NOTE — Telephone Encounter (Signed)
CALLED PATIENT TO INFORM OF PT APPT. FOR LYMPHEDEMA ON 01-19-14- ARRIVAL TIME - 3 PM @ [place], [facility], SPOKE WITH PATIENT AND HE IS AWARE OF THIS APPT.

## 2014-01-18 NOTE — Progress Notes (Signed)
Met with patient and his wife during f/u consult with Dr. Isidore Moos. 1. Provided introductory explanation of radiation treatment including SIM planning and fitting of head mask, showed them example of mask.   2. Provided a tour of SIM and Tomo areas, explained treatment and arrival procedures.   They verbalized understanding of information provided.   3. Patient gave me Continuing Disability Claim Form to be completed by Dr. Alvy Bimler and to then be faxed to ins. Co.  I will forward to Carmelina Noun in [facility].  Gayleen Orem, RN, BSN, [place] at [place] [phone]

## 2014-01-18 NOTE — Telephone Encounter (Signed)
Disability claim form sent to [place].  Faxed to  [phone].

## 2014-01-18 NOTE — Progress Notes (Signed)
Faxed disability form to [id number]

## 2014-01-19 ENCOUNTER — Ambulatory Visit: Payer: Self-pay | Admitting: Physical Therapy

## 2014-01-20 ENCOUNTER — Encounter: Payer: Self-pay | Admitting: Nutrition

## 2014-01-20 NOTE — Progress Notes (Signed)
Patient did not show up for scheduled nutrition appointment. 

## 2014-01-21 DIAGNOSIS — Z510 Encounter for antineoplastic radiation therapy: Secondary | ICD-10-CM | POA: Diagnosis not present

## 2014-01-24 ENCOUNTER — Ambulatory Visit: Payer: Medicaid Other | Attending: Radiation Oncology

## 2014-01-24 ENCOUNTER — Ambulatory Visit: Payer: Self-pay

## 2014-01-25 ENCOUNTER — Encounter: Payer: Self-pay | Admitting: Hematology and Oncology

## 2014-01-25 ENCOUNTER — Telehealth: Payer: Self-pay | Admitting: Hematology and Oncology

## 2014-01-25 ENCOUNTER — Ambulatory Visit (HOSPITAL_BASED_OUTPATIENT_CLINIC_OR_DEPARTMENT_OTHER): Payer: Medicaid Other | Admitting: Hematology and Oncology

## 2014-01-25 VITALS — BP 124/91 | HR 102 | Temp 99.0°F | Resp 18 | Ht 65.0 in | Wt 120.6 lb

## 2014-01-25 DIAGNOSIS — R519 Headache, unspecified: Secondary | ICD-10-CM | POA: Insufficient documentation

## 2014-01-25 DIAGNOSIS — F17200 Nicotine dependence, unspecified, uncomplicated: Secondary | ICD-10-CM

## 2014-01-25 DIAGNOSIS — R51 Headache: Secondary | ICD-10-CM

## 2014-01-25 DIAGNOSIS — Z720 Tobacco use: Secondary | ICD-10-CM | POA: Insufficient documentation

## 2014-01-25 DIAGNOSIS — C01 Malignant neoplasm of base of tongue: Secondary | ICD-10-CM

## 2014-01-25 NOTE — Assessment & Plan Note (Signed)
I am pleasantly surprised to see this has healed.

## 2014-01-25 NOTE — Telephone Encounter (Signed)
lvm fo rpt regarding to todays appt... °

## 2014-01-25 NOTE — Assessment & Plan Note (Signed)
He has completed all his treatment. Radiation therapy will begin tomorrow. I will see him back in 2 weeks for supportive care.

## 2014-01-25 NOTE — Progress Notes (Signed)
[facility] OFFICE PROGRESS NOTE  Patient Care Team: [name], MD as PCP - General (Hematology and Oncology) Brooks Sailors, RN as Oncology Nurse Navigator  SUMMARY OF ONCOLOGIC HISTORY: Oncology History   Cancer of base of tongue, HPV negative   Primary site: Pharynx - Oropharynx   Staging method: AJCC 7th Edition   Clinical: Stage IVB (T2, N3, M0) signed by [name], MD on 10/15/2013 10:25 AM   Summary: Stage IVB (T2, N3, M0)       Cancer of base of tongue   09/17/2013 Imaging CT scan of the neck showed large bilateral neck lymphadenopathy with associated tongue mass.   09/23/2013 Procedure Fine needle aspirate of the left neck mass came back positive for squamous cell carcinoma.   10/04/2013 Surgery Laryngoscopy showed a firm mass within the left tongue base extending past the midline and encompassing much of the tongue base.  There is extension of the mass into the vallecula and on to the lingual surface of the epiglottis.   10/04/2013 Pathology Results Vallecula and tongue biopsy confirmed squamous cell carcinoma.   10/15/2013 Imaging PET/CT scan showed no evidence of distant metastatic disease apart from just regional lymph node involvement.   10/25/2013 - 11/01/2013 Hospital Admission    10/27/2013 - 12/13/2013 Chemotherapy He was given 3 cycles of induction chemo with cisplatin, taxotere, 5 FU   11/03/2013 - 11/07/2013 Hospital Admission He was hospitalized for dehydration   12/31/2013 Procedure PICC line was removed   01/10/2014 Imaging PET Ct scan showed near complete response to treatment    INTERVAL HISTORY: Please see below for problem oriented charting. He is seen urgently because of severe, uncontrolled headache nausea and vomiting. He took some pain medicine yesterday for severe headache and throughout. He took some antibiotics round-the-clock since last night and he has resolved. The wound around his neck has improved.  REVIEW OF SYSTEMS:   Constitutional:  Denies fevers, chills or abnormal weight loss Eyes: Denies blurriness of vision Ears, nose, mouth, throat, and face: Denies mucositis or sore throat Respiratory: Denies cough, dyspnea or wheezes Cardiovascular: Denies palpitation, chest discomfort or lower extremity swelling Skin: Denies abnormal skin rashes Lymphatics: Denies new lymphadenopathy or easy bruising Neurological:Denies numbness, tingling or new weaknesses Behavioral/Psych: Mood is stable, no new changes  All other systems were reviewed with the patient and are negative.  I have reviewed the past medical history, past surgical history, social history and family history with the patient and they are unchanged from previous note.  ALLERGIES:  has No Known Allergies.  MEDICATIONS:  Current Outpatient Prescriptions  Medication Sig Dispense Refill  . acetaminophen (TYLENOL) 325 MG tablet Take 650 mg by mouth every 6 (six) hours as needed for mild pain or moderate pain.      Marland Kitchen albuterol (PROVENTIL) (2.5 MG/3ML) 0.083% nebulizer solution Take 3 mLs (2.5 mg total) by nebulization every 6 (six) hours as needed for wheezing or shortness of breath.  75 mL  12  . antiseptic oral rinse (BIOTENE) LIQD 15 mLs by Mouth Rinse route 2 (two) times daily.  1 Bottle  2  . chlorhexidine (PERIDEX) 0.12 % solution Rinse with 15 mls twice daily for 30 seconds. Use after breakfast and at bedtime. Spit out excess. Do not swallow.  480 mL  prn  . lactulose (CHRONULAC) 10 GM/15ML solution Take 15 mLs (10 g total) by mouth 2 (two) times daily as needed for mild constipation.  240 mL  2  . magnesium oxide (  MAG-OX) 400 (241.3 MG) MG tablet Take 1 tablet (400 mg total) by mouth daily.  60 tablet  1  . morphine (MS CONTIN) 15 MG 12 hr tablet Take 1 tablet (15 mg total) by mouth every 12 (twelve) hours.  60 tablet  0  . Morphine Sulfate (MORPHINE CONCENTRATE) 10 mg / 0.5 ml concentrated solution Take 0.25 mLs (5 mg total) by mouth every 2 (two) hours as needed  for severe pain.  60 mL  0  . pantoprazole (PROTONIX) 40 MG tablet Take 1 tablet (40 mg total) by mouth daily.  30 tablet  2  . polyethylene glycol (MIRALAX / GLYCOLAX) packet Take 17 g by mouth daily.  30 each  2  . prochlorperazine (COMPAZINE) 10 MG tablet Take 1 tablet (10 mg total) by mouth every 6 (six) hours as needed for nausea or vomiting.  60 tablet  3  . senna-docusate (SENOKOT-S) 8.6-50 MG per tablet Take 2 tablets by mouth 2 (two) times daily.  120 tablet  0  . temazepam (RESTORIL) 15 MG capsule TAKE 1 CAPSULE BY MOUTH AT BEDTIME AS NEEDED FOR SLEEP  30 capsule  0   No current facility-administered medications for this visit.    PHYSICAL EXAMINATION: ECOG PERFORMANCE STATUS: 1 - Symptomatic but completely ambulatory  Filed Vitals:   01/25/14 0937  BP: 124/91  Pulse: 102  Temp: 99 F (37.2 C)  Resp: 18   Filed Weights   01/25/14 0937  Weight: 120 lb 9.6 oz (54.704 kg)    GENERAL:alert, no distress and comfortable. He looks thin and cachectic. SKIN: skin color, texture, turgor are normal, no rashes or significant lesions. The neck wound has healed. EYES: normal, Conjunctiva are pink and non-injected, sclera clear OROPHARYNX:no exudate, no erythema and lips, buccal mucosa, and tongue normal  NECK: supple, thyroid normal size, non-tender, without nodularity LYMPH:  no palpable lymphadenopathy in the cervical, axillary or inguinal LUNGS: clear to auscultation and percussion with normal breathing effort HEART: regular rate & rhythm and no murmurs and no lower extremity edema ABDOMEN:abdomen soft, non-tender and normal bowel sounds Musculoskeletal:no cyanosis of digits and no clubbing  NEURO: alert & oriented x 3 with fluent speech, no focal motor/sensory deficits  LABORATORY DATA:  I have reviewed the data as listed    Component Value Date/Time   NA 138 12/31/2013 1447   NA 133* 11/06/2013 0445   K 4.2 12/31/2013 1447   K 3.5* 11/06/2013 0445   CL 98 11/06/2013 0445    CO2 25 12/31/2013 1447   CO2 21 11/06/2013 0445   GLUCOSE 88 12/31/2013 1447   GLUCOSE 99 11/06/2013 0445   BUN 12.8 12/31/2013 1447   BUN 11 11/06/2013 0445   CREATININE 0.7 12/31/2013 1447   CREATININE 0.63 11/06/2013 0445   CALCIUM 8.6 12/31/2013 1447   CALCIUM 7.0* 11/06/2013 0445   PROT 5.7* 12/31/2013 1447   PROT 5.6* 11/03/2013 2220   ALBUMIN 3.2* 12/31/2013 1447   ALBUMIN 2.5* 11/03/2013 2220   AST 11 12/31/2013 1447   AST 7 11/03/2013 2220   ALT 7 12/31/2013 1447   ALT 9 11/03/2013 2220   ALKPHOS 53 12/31/2013 1447   ALKPHOS 53 11/03/2013 2220   BILITOT <0.20 12/31/2013 1447   BILITOT 0.6 11/03/2013 2220   GFRNONAA >90 11/06/2013 0445   GFRAA >90 11/06/2013 0445    No results found for this basename: SPEP, UPEP,  kappa and lambda light chains    Lab Results  Component Value Date  WBC 13.2* 12/31/2013   NEUTROABS 8.7* 12/31/2013   HGB 9.6* 12/31/2013   HCT 29.9* 12/31/2013   MCV 104.1* 12/31/2013   PLT 481* 12/31/2013      Chemistry      Component Value Date/Time   NA 138 12/31/2013 1447   NA 133* 11/06/2013 0445   K 4.2 12/31/2013 1447   K 3.5* 11/06/2013 0445   CL 98 11/06/2013 0445   CO2 25 12/31/2013 1447   CO2 21 11/06/2013 0445   BUN 12.8 12/31/2013 1447   BUN 11 11/06/2013 0445   CREATININE 0.7 12/31/2013 1447   CREATININE 0.63 11/06/2013 0445      Component Value Date/Time   CALCIUM 8.6 12/31/2013 1447   CALCIUM 7.0* 11/06/2013 0445   ALKPHOS 53 12/31/2013 1447   ALKPHOS 53 11/03/2013 2220   AST 11 12/31/2013 1447   AST 7 11/03/2013 2220   ALT 7 12/31/2013 1447   ALT 9 11/03/2013 2220   BILITOT <0.20 12/31/2013 1447   BILITOT 0.6 11/03/2013 2220     ASSESSMENT & PLAN:  Cancer of base of tongue He has completed all his treatment. Radiation therapy will begin tomorrow. I will see him back in 2 weeks for supportive care.  Wound discharge I am pleasantly surprised to see this has healed.  Headache This might have triggered recent episode of nausea and vomiting. It has resolved.  Tobacco abuse I spent some time  counseling the patient the importance of tobacco cessation. he is currently attempting to quit on his own   No orders of the defined types were placed in this encounter.   All questions were answered. The patient knows to call the clinic with any problems, questions or concerns. No barriers to learning was detected. I spent 15 minutes counseling the patient face to face. The total time spent in the appointment was 20 minutes and more than 50% was on counseling and review of test results     [facility], Aja Bolander, MD 01/25/2014 11:13 AM

## 2014-01-25 NOTE — Assessment & Plan Note (Signed)
This might have triggered recent episode of nausea and vomiting. It has resolved.

## 2014-01-25 NOTE — Assessment & Plan Note (Signed)
I spent some time counseling the patient the importance of tobacco cessation. he is currently attempting to quit on his own 

## 2014-01-26 ENCOUNTER — Telehealth: Payer: Self-pay | Admitting: Hematology and Oncology

## 2014-01-26 ENCOUNTER — Encounter: Payer: Self-pay | Admitting: *Deleted

## 2014-01-26 ENCOUNTER — Ambulatory Visit
Admission: RE | Admit: 2014-01-26 | Discharge: 2014-01-26 | Disposition: A | Discharge status: Home / Self Care | Payer: Medicaid Other | Source: Ambulatory Visit | Attending: Radiation Oncology | Admitting: Radiation Oncology

## 2014-01-26 DIAGNOSIS — C01 Malignant neoplasm of base of tongue: Secondary | ICD-10-CM

## 2014-01-26 DIAGNOSIS — Z510 Encounter for antineoplastic radiation therapy: Secondary | ICD-10-CM | POA: Diagnosis not present

## 2014-01-26 NOTE — Progress Notes (Signed)
IMRT Device Note    ICD-9-CM ICD-10-CM  1. Cancer of base of tongue 141.0 C01    8.8 delivered field widths represent one set of IMRT treatment devices. The code is [phone].  -----------------------------------  Eppie Gibson, MD

## 2014-01-26 NOTE — Telephone Encounter (Signed)
lvm for pt regarding to OCT appts....mailed pt appt sched/avs and letter

## 2014-01-26 NOTE — Progress Notes (Signed)
To provide support and encouragement, care continuity and to assess for needs, met with patient and his wife during [place] [facility] [place].  I re-educated them re: treatment procedure, addressed wife's question re: his being able to drive himself to appts, provided them an Epic schedule for upcoming appointments.  They expressed appreciation.  Gayleen Orem, RN, BSN, [place] at [place] [phone]

## 2014-01-27 ENCOUNTER — Encounter: Payer: Self-pay | Admitting: Hematology and Oncology

## 2014-01-27 ENCOUNTER — Ambulatory Visit: Payer: Self-pay | Admitting: Hematology and Oncology

## 2014-01-27 ENCOUNTER — Ambulatory Visit
Admission: RE | Admit: 2014-01-27 | Discharge: 2014-01-27 | Disposition: A | Discharge status: Home / Self Care | Payer: Medicaid Other | Source: Ambulatory Visit | Attending: Radiation Oncology | Admitting: Radiation Oncology

## 2014-01-27 ENCOUNTER — Ambulatory Visit: Payer: Medicaid Other | Attending: Radiation Oncology | Admitting: Physical Therapy

## 2014-01-27 ENCOUNTER — Other Ambulatory Visit: Payer: Self-pay

## 2014-01-27 DIAGNOSIS — C01 Malignant neoplasm of base of tongue: Secondary | ICD-10-CM | POA: Insufficient documentation

## 2014-01-27 DIAGNOSIS — C029 Malignant neoplasm of tongue, unspecified: Secondary | ICD-10-CM | POA: Diagnosis present

## 2014-01-27 DIAGNOSIS — F17210 Nicotine dependence, cigarettes, uncomplicated: Secondary | ICD-10-CM | POA: Insufficient documentation

## 2014-01-27 DIAGNOSIS — Z510 Encounter for antineoplastic radiation therapy: Secondary | ICD-10-CM | POA: Insufficient documentation

## 2014-01-27 DIAGNOSIS — R293 Abnormal posture: Secondary | ICD-10-CM | POA: Insufficient documentation

## 2014-01-27 DIAGNOSIS — Z9221 Personal history of antineoplastic chemotherapy: Secondary | ICD-10-CM | POA: Diagnosis not present

## 2014-01-27 DIAGNOSIS — C770 Secondary and unspecified malignant neoplasm of lymph nodes of head, face and neck: Secondary | ICD-10-CM | POA: Insufficient documentation

## 2014-01-27 NOTE — Progress Notes (Signed)
Put 2 disability forms on nurse's desk. °

## 2014-01-28 ENCOUNTER — Ambulatory Visit
Admission: RE | Admit: 2014-01-28 | Discharge: 2014-01-28 | Disposition: A | Discharge status: Home / Self Care | Payer: Medicaid Other | Source: Ambulatory Visit | Attending: Radiation Oncology | Admitting: Radiation Oncology

## 2014-01-28 DIAGNOSIS — Z510 Encounter for antineoplastic radiation therapy: Secondary | ICD-10-CM | POA: Diagnosis not present

## 2014-01-31 ENCOUNTER — Encounter: Payer: Self-pay | Admitting: Radiation Oncology

## 2014-01-31 ENCOUNTER — Encounter: Payer: Self-pay | Admitting: Hematology and Oncology

## 2014-01-31 ENCOUNTER — Ambulatory Visit
Admission: RE | Admit: 2014-01-31 | Discharge: 2014-01-31 | Disposition: A | Discharge status: Home / Self Care | Payer: Medicaid Other | Source: Ambulatory Visit | Attending: Radiation Oncology | Admitting: Radiation Oncology

## 2014-01-31 VITALS — BP 157/80 | HR 61 | Temp 97.8°F | Resp 20 | Ht 65.0 in | Wt 123.6 lb

## 2014-01-31 DIAGNOSIS — C01 Malignant neoplasm of base of tongue: Secondary | ICD-10-CM

## 2014-01-31 DIAGNOSIS — Z510 Encounter for antineoplastic radiation therapy: Secondary | ICD-10-CM | POA: Diagnosis not present

## 2014-01-31 MED ORDER — BIAFINE EX EMUL
Freq: Once | CUTANEOUS | Status: AC
  Administered 2014-01-31: 14:00:00 via TOPICAL

## 2014-01-31 NOTE — Progress Notes (Signed)
Faxed disability form to [phone] and [phone]

## 2014-01-31 NOTE — Progress Notes (Signed)
Weekly Management Note:  outpatient    ICD-9-CM ICD-10-CM   1. Cancer of base of tongue 141.0 C01 topical emolient (BIAFINE) emulsion    Current Dose:  8 Gy  Projected Dose: 70 Gy   Narrative:  The patient presents for routine under treatment assessment.  CBCT/MVCT images/Port film x-rays were reviewed.  The chart was checked. He has completed 4 fractions to his head/neck. He denies pain and is taking Morphine once daily at bedtimes. He denies a sore throat or trouble swallowing. He reports a poor appetite due to eating soups because all of his teeth were pulled. His weight is up 3 lbs from 01/25/14. He denies having any mouth sores. He reports that a lymph node on the right side of his neck feels larger to him.   Physical Findings:  height is 5\' 5"  (1.651 m) and weight is 123 lb 9.6 oz (56.065 kg). His oral temperature is 97.8 F (36.6 C). His blood pressure is 157/80 and his pulse is 61. His respiration is 20.  palpable bilateral neck masses, relatively stable.  Scant serous discharge from left neck scar. Pea sized Small skin nodule above neck scar on L.  CBC    Component Value Date/Time   WBC 13.2* 12/31/2013 1446   WBC 3.8* 11/06/2013 0445   RBC 2.87* 12/31/2013 1446   RBC 2.91* 11/06/2013 0445   HGB 9.6* 12/31/2013 1446   HGB 9.4* 11/06/2013 0445   HCT 29.9* 12/31/2013 1446   HCT 26.8* 11/06/2013 0445   PLT 481* 12/31/2013 1446   PLT 156 11/06/2013 0445   MCV 104.1* 12/31/2013 1446   MCV 92.1 11/06/2013 0445   MCH 33.2 12/31/2013 1446   MCH 32.3 11/06/2013 0445   MCHC 31.9* 12/31/2013 1446   MCHC 35.1 11/06/2013 0445   RDW 20.4* 12/31/2013 1446   RDW 14.0 11/06/2013 0445   LYMPHSABS 2.4 12/31/2013 1446   LYMPHSABS 1.1 11/06/2013 0445   MONOABS 1.3* 12/31/2013 1446   MONOABS 0.7 11/06/2013 0445   EOSABS 0.6* 12/31/2013 1446   EOSABS 0.0 11/06/2013 0445   BASOSABS 0.2* 12/31/2013 1446   BASOSABS 0.0 11/06/2013 0445     CMP     Component Value Date/Time   NA 138 12/31/2013 1447   NA 133* 11/06/2013 0445    K 4.2 12/31/2013 1447   K 3.5* 11/06/2013 0445   CL 98 11/06/2013 0445   CO2 25 12/31/2013 1447   CO2 21 11/06/2013 0445   GLUCOSE 88 12/31/2013 1447   GLUCOSE 99 11/06/2013 0445   BUN 12.8 12/31/2013 1447   BUN 11 11/06/2013 0445   CREATININE 0.7 12/31/2013 1447   CREATININE 0.63 11/06/2013 0445   CALCIUM 8.6 12/31/2013 1447   CALCIUM 7.0* 11/06/2013 0445   PROT 5.7* 12/31/2013 1447   PROT 5.6* 11/03/2013 2220   ALBUMIN 3.2* 12/31/2013 1447   ALBUMIN 2.5* 11/03/2013 2220   AST 11 12/31/2013 1447   AST 7 11/03/2013 2220   ALT 7 12/31/2013 1447   ALT 9 11/03/2013 2220   ALKPHOS 53 12/31/2013 1447   ALKPHOS 53 11/03/2013 2220   BILITOT <0.20 12/31/2013 1447   BILITOT 0.6 11/03/2013 2220   GFRNONAA >90 11/06/2013 0445   GFRAA >90 11/06/2013 0445     Impression:  The patient is tolerating radiotherapy.   Plan:  Continue radiotherapy as planned.   He was given the Radiation Therapy and You book and discussed potential side effects/management of fatigue, skin changes, throat changes and mouth changes. He was  given biafine cream and was instructed to apply it twice a day after treatment and at bedtime  Encouraged PO intake, as much as tolerated, and following advice of Dory [place]  -----------------------------------  Eppie Gibson, MD

## 2014-01-31 NOTE — Progress Notes (Signed)
Angel [name] has completed 4 fractions to his head/neck.  He denies pain and is taking Morphine once daily at bedtimes.  He denies a sore throat or trouble swallowing.  He reports a poor appetite due to eating soups because all of his teeth were pulled.  His weight is up 3 lbs from 01/25/14.  He denies having any mouth sores.  He reports that a lymph node on the right side of his neck that feels larger to him.  The skin on his neck is intact.  He was given the Radiation Therapy and You book and discussed potential side effects/management of fatigue, skin changes, throat changes and mouth changes.  He was given biafine cream and was instructed to apply it twice a day after treatment and at bedtime.

## 2014-02-01 ENCOUNTER — Ambulatory Visit
Admission: RE | Admit: 2014-02-01 | Discharge: 2014-02-01 | Disposition: A | Discharge status: Home / Self Care | Payer: Medicaid Other | Source: Ambulatory Visit | Attending: Radiation Oncology | Admitting: Radiation Oncology

## 2014-02-01 DIAGNOSIS — Z510 Encounter for antineoplastic radiation therapy: Secondary | ICD-10-CM | POA: Diagnosis not present

## 2014-02-02 ENCOUNTER — Ambulatory Visit
Admission: RE | Admit: 2014-02-02 | Discharge: 2014-02-02 | Disposition: A | Discharge status: Home / Self Care | Payer: Medicaid Other | Source: Ambulatory Visit | Attending: Radiation Oncology | Admitting: Radiation Oncology

## 2014-02-02 DIAGNOSIS — Z510 Encounter for antineoplastic radiation therapy: Secondary | ICD-10-CM | POA: Diagnosis not present

## 2014-02-03 ENCOUNTER — Ambulatory Visit: Payer: Self-pay | Admitting: Hematology and Oncology

## 2014-02-03 ENCOUNTER — Ambulatory Visit
Admission: RE | Admit: 2014-02-03 | Discharge: 2014-02-03 | Disposition: A | Discharge status: Home / Self Care | Payer: Medicaid Other | Source: Ambulatory Visit | Attending: Radiation Oncology | Admitting: Radiation Oncology

## 2014-02-03 DIAGNOSIS — Z510 Encounter for antineoplastic radiation therapy: Secondary | ICD-10-CM | POA: Diagnosis not present

## 2014-02-04 ENCOUNTER — Other Ambulatory Visit (HOSPITAL_BASED_OUTPATIENT_CLINIC_OR_DEPARTMENT_OTHER): Payer: Medicaid Other

## 2014-02-04 ENCOUNTER — Ambulatory Visit
Admission: RE | Admit: 2014-02-04 | Discharge: 2014-02-04 | Disposition: A | Discharge status: Home / Self Care | Payer: Medicaid Other | Source: Ambulatory Visit | Attending: Radiation Oncology | Admitting: Radiation Oncology

## 2014-02-04 DIAGNOSIS — C029 Malignant neoplasm of tongue, unspecified: Secondary | ICD-10-CM

## 2014-02-04 DIAGNOSIS — Z510 Encounter for antineoplastic radiation therapy: Secondary | ICD-10-CM | POA: Diagnosis not present

## 2014-02-04 DIAGNOSIS — C01 Malignant neoplasm of base of tongue: Secondary | ICD-10-CM

## 2014-02-04 LAB — CBC WITH DIFFERENTIAL/PLATELET
BASO%: 1.2 % (ref 0.0–2.0)
BASOS ABS: 0.1 10*3/uL (ref 0.0–0.1)
EOS ABS: 0.7 10*3/uL — AB (ref 0.0–0.5)
EOS%: 9.2 % — AB (ref 0.0–7.0)
HCT: 39.2 % (ref 38.4–49.9)
HEMOGLOBIN: 12.7 g/dL — AB (ref 13.0–17.1)
LYMPH#: 0.8 10*3/uL — AB (ref 0.9–3.3)
LYMPH%: 10.3 % — ABNORMAL LOW (ref 14.0–49.0)
MCH: 32.2 pg (ref 27.2–33.4)
MCHC: 32.5 g/dL (ref 32.0–36.0)
MCV: 99.3 fL — ABNORMAL HIGH (ref 79.3–98.0)
MONO#: 0.6 10*3/uL (ref 0.1–0.9)
MONO%: 6.9 % (ref 0.0–14.0)
NEUT%: 72.4 % (ref 39.0–75.0)
NEUTROS ABS: 5.9 10*3/uL (ref 1.5–6.5)
Platelets: 372 10*3/uL (ref 140–400)
RBC: 3.95 10*6/uL — ABNORMAL LOW (ref 4.20–5.82)
RDW: 16.4 % — AB (ref 11.0–14.6)
WBC: 8.2 10*3/uL (ref 4.0–10.3)

## 2014-02-04 LAB — COMPREHENSIVE METABOLIC PANEL (CC13)
ALBUMIN: 4 g/dL (ref 3.5–5.0)
ALT: 7 U/L (ref 0–55)
ANION GAP: 6 meq/L (ref 3–11)
AST: 11 U/L (ref 5–34)
Alkaline Phosphatase: 74 U/L (ref 40–150)
BUN: 8.5 mg/dL (ref 7.0–26.0)
CHLORIDE: 104 meq/L (ref 98–109)
CO2: 29 meq/L (ref 22–29)
Calcium: 10 mg/dL (ref 8.4–10.4)
Creatinine: 0.8 mg/dL (ref 0.7–1.3)
GLUCOSE: 103 mg/dL (ref 70–140)
POTASSIUM: 4.4 meq/L (ref 3.5–5.1)
Sodium: 139 mEq/L (ref 136–145)
Total Bilirubin: 0.25 mg/dL (ref 0.20–1.20)
Total Protein: 7 g/dL (ref 6.4–8.3)

## 2014-02-07 ENCOUNTER — Other Ambulatory Visit: Payer: Self-pay | Admitting: *Deleted

## 2014-02-07 ENCOUNTER — Ambulatory Visit
Admission: RE | Admit: 2014-02-07 | Discharge: 2014-02-07 | Disposition: A | Discharge status: Home / Self Care | Payer: Medicaid Other | Source: Ambulatory Visit | Attending: Radiation Oncology | Admitting: Radiation Oncology

## 2014-02-07 ENCOUNTER — Encounter: Payer: Self-pay | Admitting: Hematology and Oncology

## 2014-02-07 ENCOUNTER — Telehealth: Payer: Self-pay | Admitting: Hematology and Oncology

## 2014-02-07 ENCOUNTER — Ambulatory Visit (HOSPITAL_BASED_OUTPATIENT_CLINIC_OR_DEPARTMENT_OTHER): Payer: Medicaid Other | Admitting: Hematology and Oncology

## 2014-02-07 ENCOUNTER — Ambulatory Visit: Admission: RE | Admit: 2014-02-07 | Payer: Medicaid Other | Source: Ambulatory Visit

## 2014-02-07 ENCOUNTER — Encounter: Payer: Self-pay | Admitting: Radiation Oncology

## 2014-02-07 VITALS — BP 136/81 | HR 85 | Temp 98.6°F | Resp 20 | Wt 121.9 lb

## 2014-02-07 VITALS — BP 139/79 | HR 57 | Temp 97.9°F | Wt 121.4 lb

## 2014-02-07 DIAGNOSIS — Z720 Tobacco use: Secondary | ICD-10-CM

## 2014-02-07 DIAGNOSIS — R634 Abnormal weight loss: Secondary | ICD-10-CM

## 2014-02-07 DIAGNOSIS — C01 Malignant neoplasm of base of tongue: Secondary | ICD-10-CM

## 2014-02-07 DIAGNOSIS — T148XXA Other injury of unspecified body region, initial encounter: Secondary | ICD-10-CM

## 2014-02-07 MED ORDER — MORPHINE SULFATE (CONCENTRATE) 10 MG /0.5 ML PO SOLN: 5.0000 mg | ORAL | Status: DC | PRN

## 2014-02-07 MED ORDER — TEMAZEPAM 15 MG PO CAPS: ORAL_CAPSULE | ORAL | Status: DC

## 2014-02-07 NOTE — Assessment & Plan Note (Signed)
The wound is almost completely healed. Continue close observation and local wound care

## 2014-02-07 NOTE — Progress Notes (Addendum)
Weekly rad txs b/l neck, 8 tx completed, slight erythema, left side neck has a scab using neosporin on that biafine on rest of neck,poor appetite, no taste,, lost 2 lbs since last week, will add carnation instant breakfast to meals, drinking chocolate milk, and ginger ale tongue black, stated I drink coffee, down to 4 cigarettes daily,no smokeless tobacco stated, fatigued, had cream of chicken soup l;ast night for dinner, no breakfast or lunch as yet today pain in right side of neck, stated he slept wrong on it 3:09 PM

## 2014-02-07 NOTE — Progress Notes (Signed)
Weekly Management Note:  Site: Base of tongue/neck Current Dose:  1800  cGy Projected Dose: 7000  cGy  Narrative: The patient is seen today for routine under treatment assessment. CBCT/MVCT images/port films were reviewed. The chart was reviewed.   He is without complaints today. He lost 2 pounds over the past week. He denies dysphagia. He does not have a PEG tube. He received neoadjuvant chemotherapy but not concomitant chemotherapy. He has not seen by Angel [name] of nutrition in a number of weeks. He continues to smoke.  Physical Examination:  Filed Vitals:   02/07/14 1502  BP: 136/81  Pulse: 85  Temp: 98.6 F (37 C)  Resp: 20  .  Weight: 121 lb 14.4 oz (55.293 kg). There is induration along his left neck with scarring from his extensive nodal disease. There is no drainage. Oral cavity unremarkable to inspection. Indirect mirror examination not performed.  Laboratory data: Lab Results  Component Value Date   WBC 8.2 02/04/2014   HGB 12.7* 02/04/2014   HCT 39.2 02/04/2014   MCV 99.3* 02/04/2014   PLT 372 02/04/2014     Impression: Tolerating radiation therapy well. He does not have significant dysphagia, but he lost 2 pounds over the past week. His caloric intake is probably diminished. I would like for Angel [name] of nutrition to see him this week. I again encouraged him to stop smoking. He may end up needing a PEG tube.  Plan: Continue radiation therapy as planned.

## 2014-02-07 NOTE — Progress Notes (Signed)
[place] OFFICE PROGRESS NOTE  Patient Care Team: [name], MD as PCP - General (Hematology and Oncology) Brooks Sailors, RN as Oncology Nurse Navigator  SUMMARY OF ONCOLOGIC HISTORY: Oncology History   Cancer of base of tongue, HPV negative   Primary site: Pharynx - Oropharynx   Staging method: AJCC 7th Edition   Clinical: Stage IVB (T2, N3, M0) signed by [name], MD on 10/15/2013 10:25 AM   Summary: Stage IVB (T2, N3, M0)       Cancer of base of tongue   09/17/2013 Imaging CT scan of the neck showed large bilateral neck lymphadenopathy with associated tongue mass.   09/23/2013 Procedure Fine needle aspirate of the left neck mass came back positive for squamous cell carcinoma.   10/04/2013 Surgery Laryngoscopy showed a firm mass within the left tongue base extending past the midline and encompassing much of the tongue base.  There is extension of the mass into the vallecula and on to the lingual surface of the epiglottis.   10/04/2013 Pathology Results Vallecula and tongue biopsy confirmed squamous cell carcinoma.   10/15/2013 Imaging PET/CT scan showed no evidence of distant metastatic disease apart from just regional lymph node involvement.   10/25/2013 - 11/01/2013 Hospital Admission    10/27/2013 - 12/13/2013 Chemotherapy He was given 3 cycles of induction chemo with cisplatin, taxotere, 5 FU   11/03/2013 - 11/07/2013 Hospital Admission He was hospitalized for dehydration   12/31/2013 Procedure PICC line was removed   01/10/2014 Imaging PET Ct scan showed near complete response to treatment   01/26/2014 -  Radiation Therapy He was started on radiation treatment.    INTERVAL HISTORY: Please see below for problem oriented charting. He's doing well apart from mild weight loss due to altered taste sensation. He denies neck pain. His discharge on the left neck is almost completely healed.  REVIEW OF SYSTEMS:   Constitutional: Denies fevers, chills  Eyes: Denies blurriness  of vision Ears, nose, mouth, throat, and face: Denies mucositis or sore throat Respiratory: Denies cough, dyspnea or wheezes Cardiovascular: Denies palpitation, chest discomfort or lower extremity swelling Gastrointestinal:  Denies nausea, heartburn or change in bowel habits Skin: Denies abnormal skin rashes Lymphatics: Denies new lymphadenopathy or easy bruising Neurological:Denies numbness, tingling or new weaknesses Behavioral/Psych: Mood is stable, no new changes  All other systems were reviewed with the patient and are negative.  I have reviewed the past medical history, past surgical history, social history and family history with the patient and they are unchanged from previous note.  ALLERGIES:  has No Known Allergies.  MEDICATIONS:  Current Outpatient Prescriptions  Medication Sig Dispense Refill  . acetaminophen (TYLENOL) 325 MG tablet Take 650 mg by mouth every 6 (six) hours as needed for mild pain or moderate pain.      Marland Kitchen albuterol (PROVENTIL) (2.5 MG/3ML) 0.083% nebulizer solution Take 3 mLs (2.5 mg total) by nebulization every 6 (six) hours as needed for wheezing or shortness of breath.  75 mL  12  . antiseptic oral rinse (BIOTENE) LIQD 15 mLs by Mouth Rinse route 2 (two) times daily.  1 Bottle  2  . chlorhexidine (PERIDEX) 0.12 % solution Rinse with 15 mls twice daily for 30 seconds. Use after breakfast and at bedtime. Spit out excess. Do not swallow.  480 mL  prn  . emollient (BIAFINE) cream Apply topically as needed.      . lactulose (CHRONULAC) 10 GM/15ML solution Take 15 mLs (10 g total) by mouth  2 (two) times daily as needed for mild constipation.  240 mL  2  . magnesium oxide (MAG-OX) 400 (241.3 MG) MG tablet Take 1 tablet (400 mg total) by mouth daily.  60 tablet  1  . morphine (MS CONTIN) 15 MG 12 hr tablet Take 1 tablet (15 mg total) by mouth every 12 (twelve) hours.  60 tablet  0  . Morphine Sulfate (MORPHINE CONCENTRATE) 10 mg / 0.5 ml concentrated solution Take  0.25 mLs (5 mg total) by mouth every 2 (two) hours as needed for severe pain.  60 mL  0  . pantoprazole (PROTONIX) 40 MG tablet Take 1 tablet (40 mg total) by mouth daily.  30 tablet  2  . polyethylene glycol (MIRALAX / GLYCOLAX) packet Take 17 g by mouth daily.  30 each  2  . prochlorperazine (COMPAZINE) 10 MG tablet Take 1 tablet (10 mg total) by mouth every 6 (six) hours as needed for nausea or vomiting.  60 tablet  3  . senna-docusate (SENOKOT-S) 8.6-50 MG per tablet Take 2 tablets by mouth 2 (two) times daily.  120 tablet  0  . temazepam (RESTORIL) 15 MG capsule TAKE 1 CAPSULE BY MOUTH AT BEDTIME AS NEEDED FOR SLEEP  30 capsule  0   No current facility-administered medications for this visit.    PHYSICAL EXAMINATION: ECOG PERFORMANCE STATUS: 1 - Symptomatic but completely ambulatory  Filed Vitals:   02/07/14 1547  BP: 139/79  Pulse: 57  Temp: 97.9 F (36.6 C)   Filed Weights   02/07/14 1547  Weight: 121 lb 6.4 oz (55.067 kg)    GENERAL:alert, no distress and comfortable. He looks thin SKIN: skin color, texture, turgor are normal, no rashes or significant lesions. The neck is almost completely healed. EYES: normal, Conjunctiva are pink and non-injected, sclera clear OROPHARYNX:no exudate, no erythema and lips, buccal mucosa, and tongue normal  NECK: supple, thyroid normal size, non-tender, without nodularity LYMPH:  no palpable lymphadenopathy in the cervical, axillary or inguinal LUNGS: clear to auscultation and percussion with normal breathing effort HEART: regular rate & rhythm and no murmurs and no lower extremity edema ABDOMEN:abdomen soft, non-tender and normal bowel sounds Musculoskeletal:no cyanosis of digits and no clubbing  NEURO: alert & oriented x 3 with fluent speech, no focal motor/sensory deficits  LABORATORY DATA:  I have reviewed the data as listed    Component Value Date/Time   NA 139 02/04/2014 1602   NA 133* 11/06/2013 0445   K 4.4 02/04/2014 1602   K  3.5* 11/06/2013 0445   CL 98 11/06/2013 0445   CO2 29 02/04/2014 1602   CO2 21 11/06/2013 0445   GLUCOSE 103 02/04/2014 1602   GLUCOSE 99 11/06/2013 0445   BUN 8.5 02/04/2014 1602   BUN 11 11/06/2013 0445   CREATININE 0.8 02/04/2014 1602   CREATININE 0.63 11/06/2013 0445   CALCIUM 10.0 02/04/2014 1602   CALCIUM 7.0* 11/06/2013 0445   PROT 7.0 02/04/2014 1602   PROT 5.6* 11/03/2013 2220   ALBUMIN 4.0 02/04/2014 1602   ALBUMIN 2.5* 11/03/2013 2220   AST 11 02/04/2014 1602   AST 7 11/03/2013 2220   ALT 7 02/04/2014 1602   ALT 9 11/03/2013 2220   ALKPHOS 74 02/04/2014 1602   ALKPHOS 53 11/03/2013 2220   BILITOT 0.25 02/04/2014 1602   BILITOT 0.6 11/03/2013 2220   GFRNONAA >90 11/06/2013 0445   GFRAA >90 11/06/2013 0445    No results found for this basename: SPEP, UPEP,  kappa and  lambda light chains    Lab Results  Component Value Date   WBC 8.2 02/04/2014   NEUTROABS 5.9 02/04/2014   HGB 12.7* 02/04/2014   HCT 39.2 02/04/2014   MCV 99.3* 02/04/2014   PLT 372 02/04/2014      Chemistry      Component Value Date/Time   NA 139 02/04/2014 1602   NA 133* 11/06/2013 0445   K 4.4 02/04/2014 1602   K 3.5* 11/06/2013 0445   CL 98 11/06/2013 0445   CO2 29 02/04/2014 1602   CO2 21 11/06/2013 0445   BUN 8.5 02/04/2014 1602   BUN 11 11/06/2013 0445   CREATININE 0.8 02/04/2014 1602   CREATININE 0.63 11/06/2013 0445      Component Value Date/Time   CALCIUM 10.0 02/04/2014 1602   CALCIUM 7.0* 11/06/2013 0445   ALKPHOS 74 02/04/2014 1602   ALKPHOS 53 11/03/2013 2220   AST 11 02/04/2014 1602   AST 7 11/03/2013 2220   ALT 7 02/04/2014 1602   ALT 9 11/03/2013 2220   BILITOT 0.25 02/04/2014 1602   BILITOT 0.6 11/03/2013 2220     ASSESSMENT & PLAN:  Cancer of base of tongue He is tolerating radiation well apart from loss of taste sensation and mild weight loss. We'll continue current supportive care visits.  Wound discharge The wound is almost completely healed. Continue close observation and local wound care  Weight loss This  is related to altered taste sensation. I recommend increased oral intake and agreed with dietitian review.  Tobacco abuse I spent some time counseling the patient the importance of tobacco cessation. he is currently attempting to quit on his own     No orders of the defined types were placed in this encounter.   All questions were answered. The patient knows to call the clinic with any problems, questions or concerns. No barriers to learning was detected. I spent 25 minutes counseling the patient face to face. The total time spent in the appointment was 30 minutes and more than 50% was on counseling and review of test results     [facility], [place], MD 02/07/2014 7:09 PM

## 2014-02-07 NOTE — Assessment & Plan Note (Signed)
This is related to altered taste sensation. I recommend increased oral intake and agreed with dietitian review.

## 2014-02-07 NOTE — Telephone Encounter (Signed)
Pt confirmed labs/ov per 10/12 POF, gave pt AVS.... KJ °

## 2014-02-07 NOTE — Assessment & Plan Note (Signed)
I spent some time counseling the patient the importance of tobacco cessation. he is currently attempting to quit on his own 

## 2014-02-07 NOTE — Assessment & Plan Note (Signed)
He is tolerating radiation well apart from loss of taste sensation and mild weight loss. We'll continue current supportive care visits.

## 2014-02-08 ENCOUNTER — Encounter: Payer: Self-pay | Admitting: *Deleted

## 2014-02-08 ENCOUNTER — Other Ambulatory Visit: Payer: Self-pay | Admitting: *Deleted

## 2014-02-08 ENCOUNTER — Ambulatory Visit
Admission: RE | Admit: 2014-02-08 | Discharge: 2014-02-08 | Disposition: A | Discharge status: Home / Self Care | Payer: Medicaid Other | Source: Ambulatory Visit | Attending: Radiation Oncology | Admitting: Radiation Oncology

## 2014-02-08 DIAGNOSIS — M542 Cervicalgia: Secondary | ICD-10-CM

## 2014-02-08 DIAGNOSIS — Z510 Encounter for antineoplastic radiation therapy: Secondary | ICD-10-CM | POA: Diagnosis not present

## 2014-02-08 MED ORDER — MORPHINE SULFATE ER 15 MG PO TBCR: 15.0000 mg | EXTENDED_RELEASE_TABLET | Freq: Two times a day (BID) | ORAL | Status: DC

## 2014-02-08 NOTE — Progress Notes (Signed)
To provide support and encouragement, care continuity and to assess for needs, met with patient and his wife prior to his tomo tmt.  He stated has been tolerating the tmts without difficulty, denied any needs/concerns at this time.  Gayleen Orem, RN, BSN, [place] [facility] [place] [phone]

## 2014-02-09 ENCOUNTER — Ambulatory Visit
Admission: RE | Admit: 2014-02-09 | Discharge: 2014-02-09 | Disposition: A | Discharge status: Home / Self Care | Payer: Medicaid Other | Source: Ambulatory Visit | Attending: Radiation Oncology | Admitting: Radiation Oncology

## 2014-02-09 ENCOUNTER — Other Ambulatory Visit: Payer: Self-pay | Admitting: Hematology and Oncology

## 2014-02-09 ENCOUNTER — Other Ambulatory Visit: Payer: Self-pay | Admitting: *Deleted

## 2014-02-09 DIAGNOSIS — M542 Cervicalgia: Secondary | ICD-10-CM

## 2014-02-09 DIAGNOSIS — Z510 Encounter for antineoplastic radiation therapy: Secondary | ICD-10-CM | POA: Diagnosis not present

## 2014-02-09 MED ORDER — MORPHINE SULFATE ER 15 MG PO TBCR: 15.0000 mg | EXTENDED_RELEASE_TABLET | Freq: Two times a day (BID) | ORAL | Status: DC

## 2014-02-10 ENCOUNTER — Encounter: Payer: Self-pay | Admitting: Nutrition

## 2014-02-10 ENCOUNTER — Ambulatory Visit: Payer: Medicaid Other

## 2014-02-10 NOTE — Progress Notes (Signed)
Patient's wife called and cancelled nutrition appointment.  The car won't start.

## 2014-02-11 ENCOUNTER — Ambulatory Visit
Admission: RE | Admit: 2014-02-11 | Discharge: 2014-02-11 | Disposition: A | Discharge status: Home / Self Care | Payer: Medicaid Other | Source: Ambulatory Visit | Attending: Radiation Oncology | Admitting: Radiation Oncology

## 2014-02-11 DIAGNOSIS — Z510 Encounter for antineoplastic radiation therapy: Secondary | ICD-10-CM | POA: Diagnosis not present

## 2014-02-14 ENCOUNTER — Ambulatory Visit
Admission: RE | Admit: 2014-02-14 | Discharge: 2014-02-14 | Disposition: A | Discharge status: Home / Self Care | Payer: Medicaid Other | Source: Ambulatory Visit | Attending: Radiation Oncology | Admitting: Radiation Oncology

## 2014-02-14 ENCOUNTER — Encounter: Payer: Self-pay | Admitting: Radiation Oncology

## 2014-02-14 VITALS — BP 123/82 | HR 59 | Temp 97.9°F | Resp 12 | Wt 121.3 lb

## 2014-02-14 DIAGNOSIS — C01 Malignant neoplasm of base of tongue: Secondary | ICD-10-CM

## 2014-02-14 DIAGNOSIS — Z510 Encounter for antineoplastic radiation therapy: Secondary | ICD-10-CM | POA: Diagnosis not present

## 2014-02-14 MED ORDER — LIDOCAINE VISCOUS 2 % MT SOLN: OROMUCOSAL | Status: DC

## 2014-02-14 NOTE — Progress Notes (Signed)
He rates his pain as a 5 on a scale of 0-10. Pt complains of, Fatigue, Poor Appetite, Pain  "feel like a have crick in my neck" and Pain Occurs- Intermittently, reports he had this type of pain in the past, but it has returned in the last few days. Pt presenting appropriate quality, quantity and organization of sentences. Pt denies dysphagia. PO Diet: Regular-soft. Oral exam reveals mucous membranes moist, pharynx normal without lesions, noted a small amount of white/yellow exudate on tongue. Skin hyperpigmentation -  neck, erythema - neck, small amount of dry desquamation-neck.

## 2014-02-14 NOTE — Progress Notes (Signed)
   Weekly Management Note:  outpatient    ICD-9-CM ICD-10-CM  1. Cancer of base of tongue 141.0 C01    Current Dose:  24 Gy  Projected Dose: 70 Gy   Narrative:  The patient presents for routine under treatment assessment.  CBCT/MVCT images/Port film x-rays were reviewed.  The chart was checked. Doing well.  Has some left neck pain, but only needs morphine QHS currently. No throat pain. Pt denies dysphagia. PO Diet: Regular-soft   Physical Findings:  weight is 121 lb 4.8 oz (55.021 kg). His oral temperature is 97.9 F (36.6 C). His blood pressure is 123/82 and his pulse is 59. His respiration is 12 and oxygen saturation is 97%.   Minimal drainage from left neck skin with palpable masses in left lower cervical neck and right level II neck. Oropharyngeal mucosa is intact with no thrush or lesions.    CBC    Component Value Date/Time   WBC 8.2 02/04/2014 1602   WBC 3.8* 11/06/2013 0445   RBC 3.95* 02/04/2014 1602   RBC 2.91* 11/06/2013 0445   HGB 12.7* 02/04/2014 1602   HGB 9.4* 11/06/2013 0445   HCT 39.2 02/04/2014 1602   HCT 26.8* 11/06/2013 0445   PLT 372 02/04/2014 1602   PLT 156 11/06/2013 0445   MCV 99.3* 02/04/2014 1602   MCV 92.1 11/06/2013 0445   MCH 32.2 02/04/2014 1602   MCH 32.3 11/06/2013 0445   MCHC 32.5 02/04/2014 1602   MCHC 35.1 11/06/2013 0445   RDW 16.4* 02/04/2014 1602   RDW 14.0 11/06/2013 0445   LYMPHSABS 0.8* 02/04/2014 1602   LYMPHSABS 1.1 11/06/2013 0445   MONOABS 0.6 02/04/2014 1602   MONOABS 0.7 11/06/2013 0445   EOSABS 0.7* 02/04/2014 1602   EOSABS 0.0 11/06/2013 0445   BASOSABS 0.1 02/04/2014 1602   BASOSABS 0.0 11/06/2013 0445     CMP     Component Value Date/Time   NA 139 02/04/2014 1602   NA 133* 11/06/2013 0445   K 4.4 02/04/2014 1602   K 3.5* 11/06/2013 0445   CL 98 11/06/2013 0445   CO2 29 02/04/2014 1602   CO2 21 11/06/2013 0445   GLUCOSE 103 02/04/2014 1602   GLUCOSE 99 11/06/2013 0445   BUN 8.5 02/04/2014 1602   BUN 11 11/06/2013 0445   CREATININE 0.8  02/04/2014 1602   CREATININE 0.63 11/06/2013 0445   CALCIUM 10.0 02/04/2014 1602   CALCIUM 7.0* 11/06/2013 0445   PROT 7.0 02/04/2014 1602   PROT 5.6* 11/03/2013 2220   ALBUMIN 4.0 02/04/2014 1602   ALBUMIN 2.5* 11/03/2013 2220   AST 11 02/04/2014 1602   AST 7 11/03/2013 2220   ALT 7 02/04/2014 1602   ALT 9 11/03/2013 2220   ALKPHOS 74 02/04/2014 1602   ALKPHOS 53 11/03/2013 2220   BILITOT 0.25 02/04/2014 1602   BILITOT 0.6 11/03/2013 2220   GFRNONAA >90 11/06/2013 0445   GFRAA >90 11/06/2013 0445     Impression:  The patient is tolerating radiotherapy.   Plan:  Continue radiotherapy as planned.  Lidocaine Rx for MMW mixture to make at home. -----------------------------------  Eppie Gibson, MD

## 2014-02-15 ENCOUNTER — Ambulatory Visit: Payer: Medicaid Other

## 2014-02-15 ENCOUNTER — Encounter: Payer: Self-pay | Admitting: Nutrition

## 2014-02-15 ENCOUNTER — Other Ambulatory Visit (HOSPITAL_COMMUNITY): Payer: Self-pay | Admitting: Internal Medicine

## 2014-02-16 ENCOUNTER — Ambulatory Visit
Admission: RE | Admit: 2014-02-16 | Discharge: 2014-02-16 | Disposition: A | Discharge status: Home / Self Care | Payer: Medicaid Other | Source: Ambulatory Visit | Attending: Radiation Oncology | Admitting: Radiation Oncology

## 2014-02-16 DIAGNOSIS — Z510 Encounter for antineoplastic radiation therapy: Secondary | ICD-10-CM | POA: Diagnosis not present

## 2014-02-17 ENCOUNTER — Ambulatory Visit
Admission: RE | Admit: 2014-02-17 | Discharge: 2014-02-17 | Disposition: A | Discharge status: Home / Self Care | Payer: Medicaid Other | Source: Ambulatory Visit | Attending: Radiation Oncology | Admitting: Radiation Oncology

## 2014-02-17 ENCOUNTER — Encounter: Payer: Self-pay | Admitting: Nutrition

## 2014-02-17 DIAGNOSIS — Z510 Encounter for antineoplastic radiation therapy: Secondary | ICD-10-CM | POA: Diagnosis not present

## 2014-02-18 ENCOUNTER — Ambulatory Visit
Admission: RE | Admit: 2014-02-18 | Discharge: 2014-02-18 | Disposition: A | Discharge status: Home / Self Care | Payer: Medicaid Other | Source: Ambulatory Visit | Attending: Radiation Oncology | Admitting: Radiation Oncology

## 2014-02-18 DIAGNOSIS — Z510 Encounter for antineoplastic radiation therapy: Secondary | ICD-10-CM | POA: Diagnosis not present

## 2014-02-21 ENCOUNTER — Ambulatory Visit (HOSPITAL_BASED_OUTPATIENT_CLINIC_OR_DEPARTMENT_OTHER): Payer: Medicaid Other | Admitting: Hematology and Oncology

## 2014-02-21 ENCOUNTER — Other Ambulatory Visit (HOSPITAL_BASED_OUTPATIENT_CLINIC_OR_DEPARTMENT_OTHER): Payer: Medicaid Other

## 2014-02-21 ENCOUNTER — Encounter: Payer: Self-pay | Admitting: Hematology and Oncology

## 2014-02-21 ENCOUNTER — Telehealth: Payer: Self-pay | Admitting: Hematology and Oncology

## 2014-02-21 ENCOUNTER — Ambulatory Visit
Admission: RE | Admit: 2014-02-21 | Discharge: 2014-02-21 | Disposition: A | Discharge status: Home / Self Care | Payer: Medicaid Other | Source: Ambulatory Visit | Attending: Radiation Oncology | Admitting: Radiation Oncology

## 2014-02-21 ENCOUNTER — Encounter: Payer: Self-pay | Admitting: Radiation Oncology

## 2014-02-21 VITALS — BP 125/86 | HR 67 | Temp 98.2°F | Ht 65.0 in | Wt 118.4 lb

## 2014-02-21 VITALS — BP 113/83 | HR 75 | Temp 98.4°F | Resp 18 | Ht 65.0 in | Wt 118.8 lb

## 2014-02-21 DIAGNOSIS — C01 Malignant neoplasm of base of tongue: Secondary | ICD-10-CM

## 2014-02-21 DIAGNOSIS — Z720 Tobacco use: Secondary | ICD-10-CM

## 2014-02-21 DIAGNOSIS — B370 Candidal stomatitis: Secondary | ICD-10-CM

## 2014-02-21 DIAGNOSIS — B3781 Candidal esophagitis: Secondary | ICD-10-CM

## 2014-02-21 DIAGNOSIS — R634 Abnormal weight loss: Secondary | ICD-10-CM

## 2014-02-21 DIAGNOSIS — Z510 Encounter for antineoplastic radiation therapy: Secondary | ICD-10-CM | POA: Diagnosis not present

## 2014-02-21 DIAGNOSIS — K1231 Oral mucositis (ulcerative) due to antineoplastic therapy: Secondary | ICD-10-CM

## 2014-02-21 HISTORY — DX: Candidal stomatitis: B37.0

## 2014-02-21 LAB — COMPREHENSIVE METABOLIC PANEL (CC13)
ALBUMIN: 3.8 g/dL (ref 3.5–5.0)
ALT: 9 U/L (ref 0–55)
AST: 14 U/L (ref 5–34)
Alkaline Phosphatase: 72 U/L (ref 40–150)
Anion Gap: 8 mEq/L (ref 3–11)
BUN: 11.1 mg/dL (ref 7.0–26.0)
CALCIUM: 9.7 mg/dL (ref 8.4–10.4)
CHLORIDE: 101 meq/L (ref 98–109)
CO2: 28 mEq/L (ref 22–29)
Creatinine: 0.8 mg/dL (ref 0.7–1.3)
GLUCOSE: 103 mg/dL (ref 70–140)
POTASSIUM: 4.2 meq/L (ref 3.5–5.1)
SODIUM: 138 meq/L (ref 136–145)
Total Bilirubin: 0.27 mg/dL (ref 0.20–1.20)
Total Protein: 7 g/dL (ref 6.4–8.3)

## 2014-02-21 LAB — CBC WITH DIFFERENTIAL/PLATELET
BASO%: 1 % (ref 0.0–2.0)
BASOS ABS: 0.1 10*3/uL (ref 0.0–0.1)
EOS ABS: 0.5 10*3/uL (ref 0.0–0.5)
EOS%: 6.4 % (ref 0.0–7.0)
HCT: 40.6 % (ref 38.4–49.9)
HEMOGLOBIN: 13.2 g/dL (ref 13.0–17.1)
LYMPH#: 0.5 10*3/uL — AB (ref 0.9–3.3)
LYMPH%: 6.4 % — ABNORMAL LOW (ref 14.0–49.0)
MCH: 31.6 pg (ref 27.2–33.4)
MCHC: 32.5 g/dL (ref 32.0–36.0)
MCV: 97.4 fL (ref 79.3–98.0)
MONO#: 0.7 10*3/uL (ref 0.1–0.9)
MONO%: 8.2 % (ref 0.0–14.0)
NEUT#: 6.3 10*3/uL (ref 1.5–6.5)
NEUT%: 78 % — ABNORMAL HIGH (ref 39.0–75.0)
Platelets: 380 10*3/uL (ref 140–400)
RBC: 4.17 10*6/uL — AB (ref 4.20–5.82)
RDW: 15.4 % — AB (ref 11.0–14.6)
WBC: 8.1 10*3/uL (ref 4.0–10.3)

## 2014-02-21 MED ORDER — FLUCONAZOLE 100 MG PO TABS: 100.0000 mg | ORAL_TABLET | Freq: Every day | ORAL | Status: DC

## 2014-02-21 MED ORDER — PANTOPRAZOLE SODIUM 40 MG PO TBEC: 40.0000 mg | DELAYED_RELEASE_TABLET | Freq: Every day | ORAL | Status: DC

## 2014-02-21 MED ORDER — FENTANYL 50 MCG/HR TD PT72: 50.0000 ug | MEDICATED_PATCH | TRANSDERMAL | Status: DC

## 2014-02-21 NOTE — Assessment & Plan Note (Signed)
I spent some time counseling the patient the importance of tobacco cessation. he is currently attempting to quit on his own 

## 2014-02-21 NOTE — Assessment & Plan Note (Signed)
I will place him on treatment with antifungal.

## 2014-02-21 NOTE — Assessment & Plan Note (Signed)
I recommend him to increase oral intake or placement of feeding tube. The patient will try to eat more.

## 2014-02-21 NOTE — Telephone Encounter (Signed)
Pt confirmed MD visit per 10/26 POF, gave pt AVS.... KJ

## 2014-02-21 NOTE — Assessment & Plan Note (Signed)
He has severe, uncontrollable pain. I recommend a trial of fentanyl patch for pain control.

## 2014-02-21 NOTE — Assessment & Plan Note (Signed)
He is not doing well. I will continue active supportive care  And see him on a weekly basis.

## 2014-02-21 NOTE — Progress Notes (Signed)
[place] OFFICE PROGRESS NOTE  Patient Care Team: [name], MD as PCP - General (Hematology and Oncology) Brooks Sailors, RN as Oncology Nurse Navigator  SUMMARY OF ONCOLOGIC HISTORY: Oncology History   Cancer of base of tongue, HPV negative   Primary site: Pharynx - Oropharynx   Staging method: AJCC 7th Edition   Clinical: Stage IVB (T2, N3, M0) signed by [name], MD on 10/15/2013 10:25 AM   Summary: Stage IVB (T2, N3, M0)       Cancer of base of tongue   09/17/2013 Imaging CT scan of the neck showed large bilateral neck lymphadenopathy with associated tongue mass.   09/23/2013 Procedure Fine needle aspirate of the left neck mass came back positive for squamous cell carcinoma.   10/04/2013 Surgery Laryngoscopy showed a firm mass within the left tongue base extending past the midline and encompassing much of the tongue base.  There is extension of the mass into the vallecula and on to the lingual surface of the epiglottis.   10/04/2013 Pathology Results Vallecula and tongue biopsy confirmed squamous cell carcinoma.   10/15/2013 Imaging PET/CT scan showed no evidence of distant metastatic disease apart from just regional lymph node involvement.   10/25/2013 - 11/01/2013 Hospital Admission    10/27/2013 - 12/13/2013 Chemotherapy He was given 3 cycles of induction chemo with cisplatin, taxotere, 5 FU   11/03/2013 - 11/07/2013 Hospital Admission He was hospitalized for dehydration   12/31/2013 Procedure PICC line was removed   01/10/2014 Imaging PET Ct scan showed near complete response to treatment   01/26/2014 -  Radiation Therapy He was started on radiation treatment.    INTERVAL HISTORY: Please see below for problem oriented charting. He is seen as part of his supportive care visit. He has severe, uncontrolled pain. Due to lack of taste sensation, he has no appetite to eat and has lost weight.  REVIEW OF SYSTEMS:   Constitutional: Denies fevers, chills  Eyes: Denies  blurriness of vision Respiratory: Denies cough, dyspnea or wheezes Cardiovascular: Denies palpitation, chest discomfort or lower extremity swelling Gastrointestinal:  Denies nausea, heartburn or change in bowel habits Skin: Denies abnormal skin rashes Lymphatics: Denies new lymphadenopathy or easy bruising Neurological:Denies numbness, tingling or new weaknesses Behavioral/Psych: Mood is stable, no new changes  All other systems were reviewed with the patient and are negative.  I have reviewed the past medical history, past surgical history, social history and family history with the patient and they are unchanged from previous note.  ALLERGIES:  has No Known Allergies.  MEDICATIONS:  Current Outpatient Prescriptions  Medication Sig Dispense Refill  . acetaminophen (TYLENOL) 325 MG tablet Take 650 mg by mouth every 6 (six) hours as needed for mild pain or moderate pain.      Marland Kitchen albuterol (PROVENTIL) (2.5 MG/3ML) 0.083% nebulizer solution Take 3 mLs (2.5 mg total) by nebulization every 6 (six) hours as needed for wheezing or shortness of breath.  75 mL  12  . antiseptic oral rinse (BIOTENE) LIQD 15 mLs by Mouth Rinse route 2 (two) times daily.  1 Bottle  2  . chlorhexidine (PERIDEX) 0.12 % solution Rinse with 15 mls twice daily for 30 seconds. Use after breakfast and at bedtime. Spit out excess. Do not swallow.  480 mL  prn  . emollient (BIAFINE) cream Apply topically as needed.      . lactulose (CHRONULAC) 10 GM/15ML solution Take 15 mLs (10 g total) by mouth 2 (two) times daily as needed for  mild constipation.  240 mL  2  . lidocaine (XYLOCAINE) 2 % solution Patient: mix 1part 2% viscous lidocaine, 1part H20. Swallow 10 mL of this mixture 30 min before meals and at bedtime, up to QID.  100 mL  3  . magnesium oxide (MAG-OX) 400 (241.3 MG) MG tablet Take 1 tablet (400 mg total) by mouth daily.  60 tablet  1  . morphine (MS CONTIN) 15 MG 12 hr tablet Take 1 tablet (15 mg total) by mouth every  12 (twelve) hours.  60 tablet  0  . Morphine Sulfate (MORPHINE CONCENTRATE) 10 mg / 0.5 ml concentrated solution Take 0.25 mLs (5 mg total) by mouth every 2 (two) hours as needed for severe pain.  60 mL  0  . pantoprazole (PROTONIX) 40 MG tablet Take 1 tablet (40 mg total) by mouth daily.  90 tablet  2  . polyethylene glycol (MIRALAX / GLYCOLAX) packet Take 17 g by mouth daily.  30 each  2  . prochlorperazine (COMPAZINE) 10 MG tablet Take 1 tablet (10 mg total) by mouth every 6 (six) hours as needed for nausea or vomiting.  60 tablet  3  . senna-docusate (SENOKOT-S) 8.6-50 MG per tablet Take 2 tablets by mouth 2 (two) times daily.  120 tablet  0  . temazepam (RESTORIL) 15 MG capsule TAKE 1 CAPSULE BY MOUTH AT BEDTIME AS NEEDED FOR SLEEP  30 capsule  0  . fentaNYL (DURAGESIC - DOSED MCG/HR) 50 MCG/HR Place 1 patch (50 mcg total) onto the skin every 3 (three) days.  5 patch  0  . fluconazole (DIFLUCAN) 100 MG tablet Take 1 tablet (100 mg total) by mouth daily.  7 tablet  0   No current facility-administered medications for this visit.    PHYSICAL EXAMINATION: ECOG PERFORMANCE STATUS: 1 - Symptomatic but completely ambulatory  Filed Vitals:   02/21/14 1527  BP: 113/83  Pulse: 75  Temp: 98.4 F (36.9 C)  Resp: 18   Filed Weights   02/21/14 1527  Weight: 118 lb 12.8 oz (53.887 kg)    GENERAL:alert, no distress and comfortable. He looks thin and cachectic SKIN: skin color, texture, turgor are normal, no rashes or significant lesions. Mild radiation-induced dermatitis were seen. Previous neck warrant has resolved. EYES: normal, Conjunctiva are pink and non-injected, sclera clear OROPHARYNX: Noted mucositis. Ritta Slot is noted. NECK: supple, thyroid normal size, non-tender, without nodularity LYMPH:  no palpable lymphadenopathy in the cervical, axillary or inguinal LUNGS: clear to auscultation and percussion with normal breathing effort HEART: regular rate & rhythm and no murmurs and no  lower extremity edema ABDOMEN:abdomen soft, non-tender and normal bowel sounds Musculoskeletal:no cyanosis of digits and no clubbing  NEURO: alert & oriented x 3 with fluent speech, no focal motor/sensory deficits  LABORATORY DATA:  I have reviewed the data as listed    Component Value Date/Time   NA 139 02/04/2014 1602   NA 133* 11/06/2013 0445   K 4.4 02/04/2014 1602   K 3.5* 11/06/2013 0445   CL 98 11/06/2013 0445   CO2 29 02/04/2014 1602   CO2 21 11/06/2013 0445   GLUCOSE 103 02/04/2014 1602   GLUCOSE 99 11/06/2013 0445   BUN 8.5 02/04/2014 1602   BUN 11 11/06/2013 0445   CREATININE 0.8 02/04/2014 1602   CREATININE 0.63 11/06/2013 0445   CALCIUM 10.0 02/04/2014 1602   CALCIUM 7.0* 11/06/2013 0445   PROT 7.0 02/04/2014 1602   PROT 5.6* 11/03/2013 2220   ALBUMIN 4.0 02/04/2014 1602  ALBUMIN 2.5* 11/03/2013 2220   AST 11 02/04/2014 1602   AST 7 11/03/2013 2220   ALT 7 02/04/2014 1602   ALT 9 11/03/2013 2220   ALKPHOS 74 02/04/2014 1602   ALKPHOS 53 11/03/2013 2220   BILITOT 0.25 02/04/2014 1602   BILITOT 0.6 11/03/2013 2220   GFRNONAA >90 11/06/2013 0445   GFRAA >90 11/06/2013 0445    No results found for this basename: SPEP, UPEP,  kappa and lambda light chains    Lab Results  Component Value Date   WBC 8.1 02/21/2014   NEUTROABS 6.3 02/21/2014   HGB 13.2 02/21/2014   HCT 40.6 02/21/2014   MCV 97.4 02/21/2014   PLT 380 02/21/2014      Chemistry      Component Value Date/Time   NA 139 02/04/2014 1602   NA 133* 11/06/2013 0445   K 4.4 02/04/2014 1602   K 3.5* 11/06/2013 0445   CL 98 11/06/2013 0445   CO2 29 02/04/2014 1602   CO2 21 11/06/2013 0445   BUN 8.5 02/04/2014 1602   BUN 11 11/06/2013 0445   CREATININE 0.8 02/04/2014 1602   CREATININE 0.63 11/06/2013 0445      Component Value Date/Time   CALCIUM 10.0 02/04/2014 1602   CALCIUM 7.0* 11/06/2013 0445   ALKPHOS 74 02/04/2014 1602   ALKPHOS 53 11/03/2013 2220   AST 11 02/04/2014 1602   AST 7 11/03/2013 2220   ALT 7 02/04/2014 1602   ALT 9  11/03/2013 2220   BILITOT 0.25 02/04/2014 1602   BILITOT 0.6 11/03/2013 2220     ASSESSMENT & PLAN:  Cancer of base of tongue He is not doing well. I will continue active supportive care  And see him on a weekly basis.  Tobacco abuse I spent some time counseling the patient the importance of tobacco cessation. he is currently attempting to quit on his own   Weight loss  I recommend him to increase oral intake or placement of feeding tube. The patient will try to eat more.  Thrush of mouth and esophagus  I will place him on treatment with antifungal.  Mucositis due to antineoplastic therapy He has severe, uncontrollable pain. I recommend a trial of fentanyl patch for pain control.   No orders of the defined types were placed in this encounter.   All questions were answered. The patient knows to call the clinic with any problems, questions or concerns. No barriers to learning was detected. I spent 30 minutes counseling the patient face to face. The total time spent in the appointment was 40 minutes and more than 50% was on counseling and review of test results     [facility], [place], MD 02/21/2014 3:50 PM

## 2014-02-21 NOTE — Progress Notes (Addendum)
Angel [name]  Has received 16 fractions to his BOT and bilateral neck.  He denies any pain today, but has a sore throat at a level 7/10 when swallowing food, but when not eating, he has no pain.  He was prescribed Diflucan today for thrush which is notable in the back of his throat.  Note redness of his skin on neck, but remains intact.  He reports that he is sleeping a lot.   He has lost 3 lbs since 02/14/14.  Given Radiaton Therapy and You booklet with appropriate pages marked regarding fatigue, pain, skin care and management pain/difficulty swallowing.

## 2014-02-21 NOTE — Progress Notes (Signed)
   Weekly Management Note:  Outpatient    ICD-9-CM ICD-10-CM  1. Cancer of base of tongue 141.0 C01    Current Dose:  32 Gy  Projected Dose: 70 Gy   Narrative:  The patient presents for routine under treatment assessment.  CBCT/MVCT images/Port film x-rays were reviewed.  The chart was checked. He missed his appt with Dory Peru last week but it was scheduled again for tomorrow. Lost 3 lb.  Eating is a chore.  Sore throat.  Fentanyl added to med list today by Alvy Bimler.  Had thrush, starting Diflucan.    Physical Findings:  height is 5\' 5"  (1.651 m) and weight is 118 lb 6.4 oz (53.706 kg). His temperature is 98.2 F (36.8 C). His blood pressure is 125/86 and his pulse is 67.  NAD, thrush over palate.  No drainage from left neck wound.  Skin erythematous.  CBC    Component Value Date/Time   WBC 8.1 02/21/2014 1513   WBC 3.8* 11/06/2013 0445   RBC 4.17* 02/21/2014 1513   RBC 2.91* 11/06/2013 0445   HGB 13.2 02/21/2014 1513   HGB 9.4* 11/06/2013 0445   HCT 40.6 02/21/2014 1513   HCT 26.8* 11/06/2013 0445   PLT 380 02/21/2014 1513   PLT 156 11/06/2013 0445   MCV 97.4 02/21/2014 1513   MCV 92.1 11/06/2013 0445   MCH 31.6 02/21/2014 1513   MCH 32.3 11/06/2013 0445   MCHC 32.5 02/21/2014 1513   MCHC 35.1 11/06/2013 0445   RDW 15.4* 02/21/2014 1513   RDW 14.0 11/06/2013 0445   LYMPHSABS 0.5* 02/21/2014 1513   LYMPHSABS 1.1 11/06/2013 0445   MONOABS 0.7 02/21/2014 1513   MONOABS 0.7 11/06/2013 0445   EOSABS 0.5 02/21/2014 1513   EOSABS 0.0 11/06/2013 0445   BASOSABS 0.1 02/21/2014 1513   BASOSABS 0.0 11/06/2013 0445     CMP     Component Value Date/Time   NA 138 02/21/2014 1513   NA 133* 11/06/2013 0445   K 4.2 02/21/2014 1513   K 3.5* 11/06/2013 0445   CL 98 11/06/2013 0445   CO2 28 02/21/2014 1513   CO2 21 11/06/2013 0445   GLUCOSE 103 02/21/2014 1513   GLUCOSE 99 11/06/2013 0445   BUN 11.1 02/21/2014 1513   BUN 11 11/06/2013 0445   CREATININE 0.8 02/21/2014 1513   CREATININE 0.63  11/06/2013 0445   CALCIUM 9.7 02/21/2014 1513   CALCIUM 7.0* 11/06/2013 0445   PROT 7.0 02/21/2014 1513   PROT 5.6* 11/03/2013 2220   ALBUMIN 3.8 02/21/2014 1513   ALBUMIN 2.5* 11/03/2013 2220   AST 14 02/21/2014 1513   AST 7 11/03/2013 2220   ALT 9 02/21/2014 1513   ALT 9 11/03/2013 2220   ALKPHOS 72 02/21/2014 1513   ALKPHOS 53 11/03/2013 2220   BILITOT 0.27 02/21/2014 1513   BILITOT 0.6 11/03/2013 2220   GFRNONAA >90 11/06/2013 0445   GFRAA >90 11/06/2013 0445     Impression:  The patient is tolerating radiotherapy.   Plan:  Continue radiotherapy as planned. Talked for a while about critical nature of getting good nutrition.  Encouragement given.  Continue meds as prescribed. Xylocaine MW, Narcotics -----------------------------------  Eppie Gibson, MD

## 2014-02-22 ENCOUNTER — Ambulatory Visit
Admission: RE | Admit: 2014-02-22 | Discharge: 2014-02-22 | Disposition: A | Discharge status: Home / Self Care | Payer: Medicaid Other | Source: Ambulatory Visit | Attending: Radiation Oncology | Admitting: Radiation Oncology

## 2014-02-22 ENCOUNTER — Ambulatory Visit: Payer: Medicaid Other | Admitting: Nutrition

## 2014-02-22 ENCOUNTER — Encounter: Payer: Self-pay | Admitting: Hematology and Oncology

## 2014-02-22 DIAGNOSIS — Z510 Encounter for antineoplastic radiation therapy: Secondary | ICD-10-CM | POA: Diagnosis not present

## 2014-02-22 NOTE — Progress Notes (Signed)
Faxed fentanyl pa form to [facility] °

## 2014-02-22 NOTE — Progress Notes (Signed)
Nutrition followup completed with patient, wife. Currently undergoing radiation therapy for tongue cancer.  Weight decreased and documented as 118.4 pounds October 26.  Patient's usual body weight 125 pounds.  Noted patient weight 125.7 pounds September 21.  Patient reports eating is difficult.  He has altered taste.  His throat is sore, but the lidocaine has helped him to swallow.  Reports increasing oral intake yesterday after using lidocaine.  He uses Carnation breakfast essentials or Ensure Plus as nutrition supplements.  Patient refuses feeding tube placement.  Nutrition diagnosis: Unintended weight loss continues.  Intervention: Educated patient and wife on blenderizing foods with thin consistency for easier swallowing. Recommended, low acid, foods.   Recommended patient consume Ensure Plus or Carnation breakfast essentials 3 times a day between meals. Reviewed foods patient could try at mealtimes. Oral nutrition supplement samples and coupons provided.  Monitoring, evaluation, goals: Patient will work to increase oral intake to minimize further weight loss.  Next visit: Tuesday, November 3, before radiation treatment.  **Disclaimer: This note was dictated with voice recognition software. Similar sounding words can inadvertently be transcribed and this note may contain transcription errors which may not have been corrected upon publication of note.**

## 2014-02-23 ENCOUNTER — Ambulatory Visit
Admission: RE | Admit: 2014-02-23 | Discharge: 2014-02-23 | Disposition: A | Discharge status: Home / Self Care | Payer: Medicaid Other | Source: Ambulatory Visit | Attending: Radiation Oncology | Admitting: Radiation Oncology

## 2014-02-23 DIAGNOSIS — Z510 Encounter for antineoplastic radiation therapy: Secondary | ICD-10-CM | POA: Diagnosis not present

## 2014-02-24 ENCOUNTER — Ambulatory Visit
Admission: RE | Admit: 2014-02-24 | Discharge: 2014-02-24 | Disposition: A | Discharge status: Home / Self Care | Payer: Medicaid Other | Source: Ambulatory Visit | Attending: Radiation Oncology | Admitting: Radiation Oncology

## 2014-02-24 DIAGNOSIS — Z510 Encounter for antineoplastic radiation therapy: Secondary | ICD-10-CM | POA: Diagnosis not present

## 2014-02-25 ENCOUNTER — Ambulatory Visit
Admission: RE | Admit: 2014-02-25 | Discharge: 2014-02-25 | Disposition: A | Discharge status: Home / Self Care | Payer: Medicaid Other | Source: Ambulatory Visit | Attending: Radiation Oncology | Admitting: Radiation Oncology

## 2014-02-25 DIAGNOSIS — Z510 Encounter for antineoplastic radiation therapy: Secondary | ICD-10-CM | POA: Diagnosis not present

## 2014-02-28 ENCOUNTER — Ambulatory Visit (HOSPITAL_BASED_OUTPATIENT_CLINIC_OR_DEPARTMENT_OTHER): Payer: Medicaid Other | Admitting: Hematology and Oncology

## 2014-02-28 ENCOUNTER — Inpatient Hospital Stay
Admission: RE | Admit: 2014-02-28 | Discharge: 2014-02-28 | Disposition: A | Discharge status: Home / Self Care | Payer: Self-pay | Source: Ambulatory Visit | Attending: Radiation Oncology | Admitting: Radiation Oncology

## 2014-02-28 ENCOUNTER — Encounter: Payer: Self-pay | Admitting: Hematology and Oncology

## 2014-02-28 ENCOUNTER — Ambulatory Visit
Admission: RE | Admit: 2014-02-28 | Discharge: 2014-02-28 | Disposition: A | Discharge status: Home / Self Care | Payer: Medicaid Other | Source: Ambulatory Visit | Attending: Radiation Oncology | Admitting: Radiation Oncology

## 2014-02-28 ENCOUNTER — Encounter: Payer: Self-pay | Admitting: Radiation Oncology

## 2014-02-28 VITALS — BP 105/73 | HR 75 | Temp 98.2°F | Resp 18 | Ht 65.0 in | Wt 120.2 lb

## 2014-02-28 VITALS — BP 113/75 | HR 61 | Temp 98.5°F | Ht 65.0 in | Wt 121.1 lb

## 2014-02-28 DIAGNOSIS — C01 Malignant neoplasm of base of tongue: Secondary | ICD-10-CM

## 2014-02-28 DIAGNOSIS — B370 Candidal stomatitis: Secondary | ICD-10-CM

## 2014-02-28 DIAGNOSIS — Z720 Tobacco use: Secondary | ICD-10-CM

## 2014-02-28 DIAGNOSIS — E43 Unspecified severe protein-calorie malnutrition: Secondary | ICD-10-CM

## 2014-02-28 DIAGNOSIS — B3781 Candidal esophagitis: Secondary | ICD-10-CM

## 2014-02-28 DIAGNOSIS — Z510 Encounter for antineoplastic radiation therapy: Secondary | ICD-10-CM | POA: Diagnosis not present

## 2014-02-28 DIAGNOSIS — K1231 Oral mucositis (ulcerative) due to antineoplastic therapy: Secondary | ICD-10-CM

## 2014-02-28 MED ORDER — FENTANYL 50 MCG/HR TD PT72: 50.0000 ug | MEDICATED_PATCH | TRANSDERMAL | Status: DC

## 2014-02-28 NOTE — Assessment & Plan Note (Signed)
Since he was treated with fluconazole for yeast infection, he has gained back some weight. I continue to encourage to patient to increase oral food intake.

## 2014-02-28 NOTE — Assessment & Plan Note (Signed)
He is doing well with minimum side effects from radiation. Continue aggressive supportive care and I will see him on the weekly basis.

## 2014-02-28 NOTE — Assessment & Plan Note (Signed)
I spent some time counseling the patient the importance of tobacco cessation. he is currently attempting to quit on his own 

## 2014-02-28 NOTE — Progress Notes (Signed)
Angel [name] states he has level 3 throat pain today.  He appears very fatigued today.  Skin in treatment field is red and slightly dry, but remains intact.  Using Biafine Bid as instructed.  Gained 1 lb since 02/28/14

## 2014-02-28 NOTE — Assessment & Plan Note (Signed)
This has resolved. His last dose of fluconazole is today.

## 2014-02-28 NOTE — Progress Notes (Signed)
    Weekly Management Note:  Outpatient    ICD-9-CM ICD-10-CM   1. Cancer of base of tongue 141.0 C01     Current Dose:  42 Gy  Projected Dose: 70 Gy   Narrative:  The patient presents for routine under treatment assessment.  CBCT/MVCT images/Port film x-rays were reviewed.  The chart was checked.  Eating more.  Feels better than last week. No new complaints.  Using Biafine BID.   Physical Findings:  height is 5\' 5"  (1.651 m) and weight is 121 lb 1.6 oz (54.931 kg). His temperature is 98.5 F (36.9 C). His blood pressure is 113/75 and his pulse is 61. His oxygen saturation is 97%.  NAD, no  thrush;  No drainage from left neck wound.  Skin erythematous/ hyperpigmented over neck.  CBC    Component Value Date/Time   WBC 8.1 02/21/2014 1513   WBC 3.8* 11/06/2013 0445   RBC 4.17* 02/21/2014 1513   RBC 2.91* 11/06/2013 0445   HGB 13.2 02/21/2014 1513   HGB 9.4* 11/06/2013 0445   HCT 40.6 02/21/2014 1513   HCT 26.8* 11/06/2013 0445   PLT 380 02/21/2014 1513   PLT 156 11/06/2013 0445   MCV 97.4 02/21/2014 1513   MCV 92.1 11/06/2013 0445   MCH 31.6 02/21/2014 1513   MCH 32.3 11/06/2013 0445   MCHC 32.5 02/21/2014 1513   MCHC 35.1 11/06/2013 0445   RDW 15.4* 02/21/2014 1513   RDW 14.0 11/06/2013 0445   LYMPHSABS 0.5* 02/21/2014 1513   LYMPHSABS 1.1 11/06/2013 0445   MONOABS 0.7 02/21/2014 1513   MONOABS 0.7 11/06/2013 0445   EOSABS 0.5 02/21/2014 1513   EOSABS 0.0 11/06/2013 0445   BASOSABS 0.1 02/21/2014 1513   BASOSABS 0.0 11/06/2013 0445     CMP     Component Value Date/Time   NA 138 02/21/2014 1513   NA 133* 11/06/2013 0445   K 4.2 02/21/2014 1513   K 3.5* 11/06/2013 0445   CL 98 11/06/2013 0445   CO2 28 02/21/2014 1513   CO2 21 11/06/2013 0445   GLUCOSE 103 02/21/2014 1513   GLUCOSE 99 11/06/2013 0445   BUN 11.1 02/21/2014 1513   BUN 11 11/06/2013 0445   CREATININE 0.8 02/21/2014 1513   CREATININE 0.63 11/06/2013 0445   CALCIUM 9.7 02/21/2014 1513   CALCIUM 7.0* 11/06/2013 0445   PROT 7.0 02/21/2014 1513   PROT 5.6* 11/03/2013 2220   ALBUMIN 3.8 02/21/2014 1513   ALBUMIN 2.5* 11/03/2013 2220   AST 14 02/21/2014 1513   AST 7 11/03/2013 2220   ALT 9 02/21/2014 1513   ALT 9 11/03/2013 2220   ALKPHOS 72 02/21/2014 1513   ALKPHOS 53 11/03/2013 2220   BILITOT 0.27 02/21/2014 1513   BILITOT 0.6 11/03/2013 2220   GFRNONAA >90 11/06/2013 0445   GFRAA >90 11/06/2013 0445     Impression:  The patient is tolerating radiotherapy.   Plan:  Continue radiotherapy as planned. Compliance improved with nutritional intake.  Encouragement given.  Continue meds as prescribed. Xylocaine MW, Narcotics PRN - pain well controlled. -----------------------------------  Eppie Gibson, MD

## 2014-02-28 NOTE — Assessment & Plan Note (Signed)
His pain is better controlled with fentanyl patch. We'll continue the same.

## 2014-02-28 NOTE — Progress Notes (Signed)
[place] OFFICE PROGRESS NOTE  Patient Care Team: [name], MD as PCP - General (Hematology and Oncology) Brooks Sailors, RN as Oncology Nurse Navigator  SUMMARY OF ONCOLOGIC HISTORY: Oncology History   Cancer of base of tongue, HPV negative   Primary site: Pharynx - Oropharynx   Staging method: AJCC 7th Edition   Clinical: Stage IVB (T2, N3, M0) signed by [name], MD on 10/15/2013 10:25 AM   Summary: Stage IVB (T2, N3, M0)       Cancer of base of tongue   09/17/2013 Imaging CT scan of the neck showed large bilateral neck lymphadenopathy with associated tongue mass.   09/23/2013 Procedure Fine needle aspirate of the left neck mass came back positive for squamous cell carcinoma.   10/04/2013 Surgery Laryngoscopy showed a firm mass within the left tongue base extending past the midline and encompassing much of the tongue base.  There is extension of the mass into the vallecula and on to the lingual surface of the epiglottis.   10/04/2013 Pathology Results Vallecula and tongue biopsy confirmed squamous cell carcinoma.   10/15/2013 Imaging PET/CT scan showed no evidence of distant metastatic disease apart from just regional lymph node involvement.   10/25/2013 - 11/01/2013 Hospital Admission    10/27/2013 - 12/13/2013 Chemotherapy He was given 3 cycles of induction chemo with cisplatin, taxotere, 5 FU   11/03/2013 - 11/07/2013 Hospital Admission He was hospitalized for dehydration   12/31/2013 Procedure PICC line was removed   01/10/2014 Imaging PET Ct scan showed near complete response to treatment   01/26/2014 -  Radiation Therapy He was started on radiation treatment.    INTERVAL HISTORY: Please see below for problem oriented charting. He is doing better. His swallowing has improved and the pain in his throat has improved. He has skin some weight.  REVIEW OF SYSTEMS:   Constitutional: Denies fevers, chills or abnormal weight loss Eyes: Denies blurriness of vision Ears,  nose, mouth, throat, and face: Denies mucositis or sore throat Respiratory: Denies cough, dyspnea or wheezes Cardiovascular: Denies palpitation, chest discomfort or lower extremity swelling Gastrointestinal:  Denies nausea, heartburn or change in bowel habits Skin: Denies abnormal skin rashes Lymphatics: Denies new lymphadenopathy or easy bruising Neurological:Denies numbness, tingling or new weaknesses Behavioral/Psych: Mood is stable, no new changes  All other systems were reviewed with the patient and are negative.  I have reviewed the past medical history, past surgical history, social history and family history with the patient and they are unchanged from previous note.  ALLERGIES:  has No Known Allergies.  MEDICATIONS:  Current Outpatient Prescriptions  Medication Sig Dispense Refill  . acetaminophen (TYLENOL) 325 MG tablet Take 650 mg by mouth every 6 (six) hours as needed for mild pain or moderate pain.    Marland Kitchen albuterol (PROVENTIL) (2.5 MG/3ML) 0.083% nebulizer solution Take 3 mLs (2.5 mg total) by nebulization every 6 (six) hours as needed for wheezing or shortness of breath. 75 mL 12  . antiseptic oral rinse (BIOTENE) LIQD 15 mLs by Mouth Rinse route 2 (two) times daily. 1 Bottle 2  . chlorhexidine (PERIDEX) 0.12 % solution Rinse with 15 mls twice daily for 30 seconds. Use after breakfast and at bedtime. Spit out excess. Do not swallow. 480 mL prn  . emollient (BIAFINE) cream Apply topically as needed.    . fentaNYL (DURAGESIC - DOSED MCG/HR) 50 MCG/HR Place 1 patch (50 mcg total) onto the skin every 3 (three) days. 5 patch 0  . fluconazole (  DIFLUCAN) 100 MG tablet Take 1 tablet (100 mg total) by mouth daily. 7 tablet 0  . lactulose (CHRONULAC) 10 GM/15ML solution Take 15 mLs (10 g total) by mouth 2 (two) times daily as needed for mild constipation. 240 mL 2  . lidocaine (XYLOCAINE) 2 % solution Patient: mix 1part 2% viscous lidocaine, 1part H20. Swallow 10 mL of this mixture 30 min  before meals and at bedtime, up to QID. 100 mL 3  . magnesium oxide (MAG-OX) 400 (241.3 MG) MG tablet Take 1 tablet (400 mg total) by mouth daily. 60 tablet 1  . morphine (MS CONTIN) 15 MG 12 hr tablet Take 1 tablet (15 mg total) by mouth every 12 (twelve) hours. 60 tablet 0  . Morphine Sulfate (MORPHINE CONCENTRATE) 10 mg / 0.5 ml concentrated solution Take 0.25 mLs (5 mg total) by mouth every 2 (two) hours as needed for severe pain. 60 mL 0  . pantoprazole (PROTONIX) 40 MG tablet Take 1 tablet (40 mg total) by mouth daily. 90 tablet 2  . polyethylene glycol (MIRALAX / GLYCOLAX) packet Take 17 g by mouth daily. 30 each 2  . prochlorperazine (COMPAZINE) 10 MG tablet Take 1 tablet (10 mg total) by mouth every 6 (six) hours as needed for nausea or vomiting. 60 tablet 3  . senna-docusate (SENOKOT-S) 8.6-50 MG per tablet Take 2 tablets by mouth 2 (two) times daily. 120 tablet 0  . temazepam (RESTORIL) 15 MG capsule TAKE 1 CAPSULE BY MOUTH AT BEDTIME AS NEEDED FOR SLEEP 30 capsule 0   No current facility-administered medications for this visit.    PHYSICAL EXAMINATION: ECOG PERFORMANCE STATUS: 0 - Asymptomatic  Filed Vitals:   02/28/14 1532  BP: 105/73  Pulse: 75  Temp: 98.2 F (36.8 C)  Resp: 18   Filed Weights   02/28/14 1532  Weight: 120 lb 3.2 oz (54.522 kg)    GENERAL:alert, no distress and comfortable. He is thin SKIN: no other radiation-induced skin dermatitis. No ulceration. EYES: normal, Conjunctiva are pink and non-injected, sclera clear OROPHARYNX:no exudate, no erythema and lips, buccal mucosa, and tongue normal  NECK: supple, thyroid normal size, non-tender, without nodularity LYMPH:  no palpable lymphadenopathy in the cervical, axillary or inguinal LUNGS: clear to auscultation and percussion with normal breathing effort HEART: regular rate & rhythm and no murmurs and no lower extremity edema ABDOMEN:abdomen soft, non-tender and normal bowel sounds Musculoskeletal:no  cyanosis of digits and no clubbing  NEURO: alert & oriented x 3 with fluent speech, no focal motor/sensory deficits  LABORATORY DATA:  I have reviewed the data as listed    Component Value Date/Time   NA 138 02/21/2014 1513   NA 133* 11/06/2013 0445   K 4.2 02/21/2014 1513   K 3.5* 11/06/2013 0445   CL 98 11/06/2013 0445   CO2 28 02/21/2014 1513   CO2 21 11/06/2013 0445   GLUCOSE 103 02/21/2014 1513   GLUCOSE 99 11/06/2013 0445   BUN 11.1 02/21/2014 1513   BUN 11 11/06/2013 0445   CREATININE 0.8 02/21/2014 1513   CREATININE 0.63 11/06/2013 0445   CALCIUM 9.7 02/21/2014 1513   CALCIUM 7.0* 11/06/2013 0445   PROT 7.0 02/21/2014 1513   PROT 5.6* 11/03/2013 2220   ALBUMIN 3.8 02/21/2014 1513   ALBUMIN 2.5* 11/03/2013 2220   AST 14 02/21/2014 1513   AST 7 11/03/2013 2220   ALT 9 02/21/2014 1513   ALT 9 11/03/2013 2220   ALKPHOS 72 02/21/2014 1513   ALKPHOS 53 11/03/2013 2220  BILITOT 0.27 02/21/2014 1513   BILITOT 0.6 11/03/2013 2220   GFRNONAA >90 11/06/2013 0445   GFRAA >90 11/06/2013 0445    No results found for: SPEP, UPEP  Lab Results  Component Value Date   WBC 8.1 02/21/2014   NEUTROABS 6.3 02/21/2014   HGB 13.2 02/21/2014   HCT 40.6 02/21/2014   MCV 97.4 02/21/2014   PLT 380 02/21/2014      Chemistry      Component Value Date/Time   NA 138 02/21/2014 1513   NA 133* 11/06/2013 0445   K 4.2 02/21/2014 1513   K 3.5* 11/06/2013 0445   CL 98 11/06/2013 0445   CO2 28 02/21/2014 1513   CO2 21 11/06/2013 0445   BUN 11.1 02/21/2014 1513   BUN 11 11/06/2013 0445   CREATININE 0.8 02/21/2014 1513   CREATININE 0.63 11/06/2013 0445      Component Value Date/Time   CALCIUM 9.7 02/21/2014 1513   CALCIUM 7.0* 11/06/2013 0445   ALKPHOS 72 02/21/2014 1513   ALKPHOS 53 11/03/2013 2220   AST 14 02/21/2014 1513   AST 7 11/03/2013 2220   ALT 9 02/21/2014 1513   ALT 9 11/03/2013 2220   BILITOT 0.27 02/21/2014 1513   BILITOT 0.6 11/03/2013 2220     ASSESSMENT  & PLAN:  Cancer of base of tongue He is doing well with minimum side effects from radiation. Continue aggressive supportive care and I will see him on the weekly basis.  Mucositis due to antineoplastic therapy His pain is better controlled with fentanyl patch. We'll continue the same.    Thrush of mouth and esophagus This has resolved. His last dose of fluconazole is today.  Tobacco abuse I spent some time counseling the patient the importance of tobacco cessation. he is currently attempting to quit on his own  Protein-calorie malnutrition, severe Since he was treated with fluconazole for yeast infection, he has gained back some weight. I continue to encourage to patient to increase oral food intake.   No orders of the defined types were placed in this encounter.   All questions were answered. The patient knows to call the clinic with any problems, questions or concerns. No barriers to learning was detected. I spent 25 minutes counseling the patient face to face. The total time spent in the appointment was 30 minutes and more than 50% was on counseling and review of test results     [facility], [place], MD 02/28/2014 3:50 PM

## 2014-03-01 ENCOUNTER — Encounter: Payer: Self-pay | Admitting: *Deleted

## 2014-03-01 ENCOUNTER — Ambulatory Visit: Payer: Medicaid - Dental | Admitting: Nutrition

## 2014-03-01 ENCOUNTER — Ambulatory Visit
Admission: RE | Admit: 2014-03-01 | Discharge: 2014-03-01 | Disposition: A | Discharge status: Home / Self Care | Payer: Medicaid Other | Source: Ambulatory Visit | Attending: Radiation Oncology | Admitting: Radiation Oncology

## 2014-03-01 DIAGNOSIS — Z510 Encounter for antineoplastic radiation therapy: Secondary | ICD-10-CM | POA: Diagnosis not present

## 2014-03-01 NOTE — Progress Notes (Signed)
Nutrition followup completed with patient.  Currently undergoing radiation therapy for tongue cancer.  Weight has increased and was documented as 121.1 pounds November 2.  Increased from 118.4 pounds October 26.  Patient states oral intake has improved.  He feels better overall.  He is drinking 2 oral nutrition supplements daily.  He has enough samples to get through until next week.  No nutrition concerns.  Nutrition diagnosis: Unintended weight loss improved.  Intervention: Recommended patient continue increased oral intake with oral nutrition supplements at least twice a day. Teach back method used.  Monitoring, evaluation, goals: Patient will continue to work to increase oral intake to promote weight gain or stabilization.  Next visit: Tuesday, November 10, before radiation treatment.  **Disclaimer: This note was dictated with voice recognition software. Similar sounding words can inadvertently be transcribed and this note may contain transcription errors which may not have been corrected upon publication of note.**

## 2014-03-02 ENCOUNTER — Ambulatory Visit
Admission: RE | Admit: 2014-03-02 | Discharge: 2014-03-02 | Disposition: A | Discharge status: Home / Self Care | Payer: Medicaid Other | Source: Ambulatory Visit | Attending: Radiation Oncology | Admitting: Radiation Oncology

## 2014-03-02 DIAGNOSIS — C01 Malignant neoplasm of base of tongue: Secondary | ICD-10-CM

## 2014-03-02 DIAGNOSIS — Z510 Encounter for antineoplastic radiation therapy: Secondary | ICD-10-CM | POA: Diagnosis not present

## 2014-03-02 NOTE — Progress Notes (Signed)
To provide support and encouragement, care continuity and to assess for needs, met with patient and his wife during [place].  He reported: 1. Increasing fatigue, "sleeping a lot". 2. BID application of Biafine, though he forgets the evening application on occasion. 3. Acceptable pain control with 50 mcg Fentanyl, minimal PRN pain medication. 4. Improved oral intake. He denied any needs/concerns, understands he can contact me.  Gayleen Orem, RN, BSN, [place] [facility] [phone]

## 2014-03-03 ENCOUNTER — Other Ambulatory Visit: Payer: Self-pay | Admitting: Hematology and Oncology

## 2014-03-03 ENCOUNTER — Ambulatory Visit
Admission: RE | Admit: 2014-03-03 | Discharge: 2014-03-03 | Disposition: A | Discharge status: Home / Self Care | Payer: Medicaid Other | Source: Ambulatory Visit | Attending: Radiation Oncology | Admitting: Radiation Oncology

## 2014-03-03 DIAGNOSIS — Z510 Encounter for antineoplastic radiation therapy: Secondary | ICD-10-CM | POA: Diagnosis not present

## 2014-03-04 ENCOUNTER — Ambulatory Visit
Admission: RE | Admit: 2014-03-04 | Discharge: 2014-03-04 | Disposition: A | Discharge status: Home / Self Care | Payer: Medicaid Other | Source: Ambulatory Visit | Attending: Radiation Oncology | Admitting: Radiation Oncology

## 2014-03-04 DIAGNOSIS — Z510 Encounter for antineoplastic radiation therapy: Secondary | ICD-10-CM | POA: Diagnosis not present

## 2014-03-04 NOTE — Addendum Note (Signed)
Encounter addended by: Deirdre Evener, RN on: 03/04/2014  1:23 PM<BR>     Documentation filed: Notes Section

## 2014-03-07 ENCOUNTER — Encounter: Payer: Self-pay | Admitting: Radiation Oncology

## 2014-03-07 ENCOUNTER — Ambulatory Visit
Admission: RE | Admit: 2014-03-07 | Discharge: 2014-03-07 | Disposition: A | Discharge status: Home / Self Care | Payer: Medicaid Other | Source: Ambulatory Visit | Attending: Radiation Oncology | Admitting: Radiation Oncology

## 2014-03-07 VITALS — BP 131/75 | HR 57 | Temp 98.4°F | Ht 65.0 in | Wt 118.8 lb

## 2014-03-07 DIAGNOSIS — Z510 Encounter for antineoplastic radiation therapy: Secondary | ICD-10-CM | POA: Insufficient documentation

## 2014-03-07 DIAGNOSIS — C01 Malignant neoplasm of base of tongue: Secondary | ICD-10-CM | POA: Diagnosis not present

## 2014-03-07 MED ORDER — BIAFINE EX EMUL: Freq: Two times a day (BID) | CUTANEOUS | Status: DC

## 2014-03-07 MED ORDER — BIAFINE EX EMUL
Freq: Two times a day (BID) | CUTANEOUS | Status: DC
Start: 2014-03-07 — End: 2014-03-08
  Administered 2014-03-07: 18:00:00 via TOPICAL

## 2014-03-07 MED ORDER — BIAFINE EX EMUL
Freq: Two times a day (BID) | CUTANEOUS | Status: DC
  Administered 2014-03-07: 18:00:00 via TOPICAL

## 2014-03-07 NOTE — Addendum Note (Signed)
Encounter addended by: Deirdre Evener, RN on: 03/07/2014  5:27 PM<BR>     Documentation filed: [employer], Orders

## 2014-03-07 NOTE — Progress Notes (Signed)
Angel [name] has received 26 fractions to his BOT and bilateral neck.  He denies any pain today.  Note bright erythema of neck with tenderness, dryness and itching. that he grades as a level 5/10.  He admits to fatigue and is "tired all the time."  He has lost 3 lbs since 02/28/14.  He is eating soft foods, and staying hydrated.  VSS.

## 2014-03-07 NOTE — Addendum Note (Signed)
Encounter addended by: Deirdre Evener, RN on: 03/07/2014  6:07 PM<BR>     Documentation filed: Orders, [employer], Inpatient [facility]

## 2014-03-07 NOTE — Progress Notes (Signed)
   Weekly Management Note:  outpatient    ICD-9-CM ICD-10-CM   1. Cancer of base of tongue 141.0 C01 topical emolient (BIAFINE) emulsion    Current Dose:  52 Gy  Projected Dose: 70 Gy   Narrative:  The patient presents for routine under treatment assessment.  CBCT/MVCT images/Port film x-rays were reviewed.  The chart was checked. Feeling relatively well. No pain.  Poor appetite, but staying hydrating, and eating bland food.    Physical Findings:  height is 5\' 5"  (1.651 m) and weight is 118 lb 12.8 oz (53.887 kg). His temperature is 98.4 F (36.9 C). His blood pressure is 131/75 and his pulse is 57. His oxygen saturation is 100%.  NAD, erythematous oropharyngeal mucosa.  Skin over neck erythematous, dry.  CBC    Component Value Date/Time   WBC 8.1 02/21/2014 1513   WBC 3.8* 11/06/2013 0445   RBC 4.17* 02/21/2014 1513   RBC 2.91* 11/06/2013 0445   HGB 13.2 02/21/2014 1513   HGB 9.4* 11/06/2013 0445   HCT 40.6 02/21/2014 1513   HCT 26.8* 11/06/2013 0445   PLT 380 02/21/2014 1513   PLT 156 11/06/2013 0445   MCV 97.4 02/21/2014 1513   MCV 92.1 11/06/2013 0445   MCH 31.6 02/21/2014 1513   MCH 32.3 11/06/2013 0445   MCHC 32.5 02/21/2014 1513   MCHC 35.1 11/06/2013 0445   RDW 15.4* 02/21/2014 1513   RDW 14.0 11/06/2013 0445   LYMPHSABS 0.5* 02/21/2014 1513   LYMPHSABS 1.1 11/06/2013 0445   MONOABS 0.7 02/21/2014 1513   MONOABS 0.7 11/06/2013 0445   EOSABS 0.5 02/21/2014 1513   EOSABS 0.0 11/06/2013 0445   BASOSABS 0.1 02/21/2014 1513   BASOSABS 0.0 11/06/2013 0445     CMP     Component Value Date/Time   NA 138 02/21/2014 1513   NA 133* 11/06/2013 0445   K 4.2 02/21/2014 1513   K 3.5* 11/06/2013 0445   CL 98 11/06/2013 0445   CO2 28 02/21/2014 1513   CO2 21 11/06/2013 0445   GLUCOSE 103 02/21/2014 1513   GLUCOSE 99 11/06/2013 0445   BUN 11.1 02/21/2014 1513   BUN 11 11/06/2013 0445   CREATININE 0.8 02/21/2014 1513   CREATININE 0.63 11/06/2013 0445   CALCIUM 9.7  02/21/2014 1513   CALCIUM 7.0* 11/06/2013 0445   PROT 7.0 02/21/2014 1513   PROT 5.6* 11/03/2013 2220   ALBUMIN 3.8 02/21/2014 1513   ALBUMIN 2.5* 11/03/2013 2220   AST 14 02/21/2014 1513   AST 7 11/03/2013 2220   ALT 9 02/21/2014 1513   ALT 9 11/03/2013 2220   ALKPHOS 72 02/21/2014 1513   ALKPHOS 53 11/03/2013 2220   BILITOT 0.27 02/21/2014 1513   BILITOT 0.6 11/03/2013 2220   GFRNONAA >90 11/06/2013 0445   GFRAA >90 11/06/2013 0445     Impression:  The patient is tolerating radiotherapy.   Plan:  Continue radiotherapy as planned. Encouraged nutrition / PO intake.  -----------------------------------  Eppie Gibson, MD

## 2014-03-08 ENCOUNTER — Ambulatory Visit: Payer: Medicaid Other | Admitting: Nutrition

## 2014-03-08 ENCOUNTER — Ambulatory Visit
Admission: RE | Admit: 2014-03-08 | Discharge: 2014-03-08 | Disposition: A | Discharge status: Home / Self Care | Payer: Medicaid Other | Source: Ambulatory Visit | Attending: Radiation Oncology | Admitting: Radiation Oncology

## 2014-03-08 DIAGNOSIS — Z510 Encounter for antineoplastic radiation therapy: Secondary | ICD-10-CM | POA: Diagnosis not present

## 2014-03-08 NOTE — Progress Notes (Signed)
Patient presents to nutrition followup. Weight documented as 119.4 pounds today down slightly from 121.1 pounds November 2.  Increased from 118.4 pounds October 26.  Patient reports appetite has improved.  If he forgets to take his lidocaine, swallowing is very difficult.  He is still drinking 2 oral nutrition supplements daily.  Nutrition diagnosis: Unintended weight loss improved.  Intervention: Patient educated to increase oral nutrition supplements to 3 times a day.  Provided additional samples. Questions were answered.  Teach back method used.    Monitoring, evaluation, goals: Patient will continue to work to increase oral intake to promote weight maintenance.  Next visit:Tuesday, November 17.  **Disclaimer: This note was dictated with voice recognition software. Similar sounding words can inadvertently be transcribed and this note may contain transcription errors which may not have been corrected upon publication of note.**

## 2014-03-09 ENCOUNTER — Ambulatory Visit
Admission: RE | Admit: 2014-03-09 | Discharge: 2014-03-09 | Disposition: A | Discharge status: Home / Self Care | Payer: Medicaid Other | Source: Ambulatory Visit | Attending: Radiation Oncology | Admitting: Radiation Oncology

## 2014-03-09 DIAGNOSIS — Z510 Encounter for antineoplastic radiation therapy: Secondary | ICD-10-CM | POA: Diagnosis not present

## 2014-03-10 ENCOUNTER — Encounter: Payer: Self-pay | Admitting: Hematology and Oncology

## 2014-03-10 ENCOUNTER — Ambulatory Visit
Admission: RE | Admit: 2014-03-10 | Discharge: 2014-03-10 | Disposition: A | Discharge status: Home / Self Care | Payer: Medicaid Other | Source: Ambulatory Visit | Attending: Radiation Oncology | Admitting: Radiation Oncology

## 2014-03-10 ENCOUNTER — Telehealth: Payer: Self-pay | Admitting: Hematology and Oncology

## 2014-03-10 ENCOUNTER — Ambulatory Visit (HOSPITAL_BASED_OUTPATIENT_CLINIC_OR_DEPARTMENT_OTHER): Payer: Medicaid Other | Admitting: Hematology and Oncology

## 2014-03-10 ENCOUNTER — Other Ambulatory Visit: Payer: Self-pay | Admitting: Hematology and Oncology

## 2014-03-10 VITALS — BP 122/84 | HR 62 | Temp 98.3°F | Resp 18 | Ht 65.0 in | Wt 117.7 lb

## 2014-03-10 DIAGNOSIS — C01 Malignant neoplasm of base of tongue: Secondary | ICD-10-CM

## 2014-03-10 DIAGNOSIS — R634 Abnormal weight loss: Secondary | ICD-10-CM

## 2014-03-10 DIAGNOSIS — B370 Candidal stomatitis: Secondary | ICD-10-CM

## 2014-03-10 DIAGNOSIS — B3781 Candidal esophagitis: Secondary | ICD-10-CM

## 2014-03-10 DIAGNOSIS — Z510 Encounter for antineoplastic radiation therapy: Secondary | ICD-10-CM | POA: Diagnosis not present

## 2014-03-10 DIAGNOSIS — Z720 Tobacco use: Secondary | ICD-10-CM

## 2014-03-10 NOTE — Assessment & Plan Note (Signed)
I recommend him to increase oral intake. The patient will try to eat more. The dietitian is following closely

## 2014-03-10 NOTE — Assessment & Plan Note (Signed)
I spent some time counseling the patient the importance of tobacco cessation. he is currently attempting to quit on his own 

## 2014-03-10 NOTE — Assessment & Plan Note (Signed)
He is doing well with minimum side effects from radiation. Continue aggressive supportive care and I will see him on the weekly basis for supportive care.

## 2014-03-10 NOTE — Progress Notes (Signed)
[facility] OFFICE PROGRESS NOTE  Patient Care Team: [name], MD as PCP - General (Hematology and Oncology) Brooks Sailors, RN as Oncology Nurse Navigator  SUMMARY OF ONCOLOGIC HISTORY: Oncology History   Cancer of base of tongue, HPV negative   Primary site: Pharynx - Oropharynx   Staging method: AJCC 7th Edition   Clinical: Stage IVB (T2, N3, M0) signed by [name], MD on 10/15/2013 10:25 AM   Summary: Stage IVB (T2, N3, M0)       Cancer of base of tongue   09/17/2013 Imaging CT scan of the neck showed large bilateral neck lymphadenopathy with associated tongue mass.   09/23/2013 Procedure Fine needle aspirate of the left neck mass came back positive for squamous cell carcinoma.   10/04/2013 Surgery Laryngoscopy showed a firm mass within the left tongue base extending past the midline and encompassing much of the tongue base.  There is extension of the mass into the vallecula and on to the lingual surface of the epiglottis.   10/04/2013 Pathology Results Vallecula and tongue biopsy confirmed squamous cell carcinoma.   10/15/2013 Imaging PET/CT scan showed no evidence of distant metastatic disease apart from just regional lymph node involvement.   10/25/2013 - 11/01/2013 Hospital Admission    10/27/2013 - 12/13/2013 Chemotherapy He was given 3 cycles of induction chemo with cisplatin, taxotere, 5 FU   11/03/2013 - 11/07/2013 Hospital Admission He was hospitalized for dehydration   12/31/2013 Procedure PICC line was removed   01/10/2014 Imaging PET Ct scan showed near complete response to treatment   01/26/2014 -  Radiation Therapy He was started on radiation treatment.    INTERVAL HISTORY: Please see below for problem oriented charting. He is seen as part of supportive care visit. He denies neck wound discharge. Complained of altered taste sensation. Pain is under good control.  REVIEW OF SYSTEMS:   Constitutional: Denies fevers, chills  Eyes: Denies blurriness of  vision Ears, nose, mouth, throat, and face: Denies mucositis or sore throat Respiratory: Denies cough, dyspnea or wheezes Cardiovascular: Denies palpitation, chest discomfort or lower extremity swelling Gastrointestinal:  Denies nausea, heartburn or change in bowel habits Skin: Denies abnormal skin rashes Lymphatics: Denies new lymphadenopathy or easy bruising Neurological:Denies numbness, tingling or new weaknesses Behavioral/Psych: Mood is stable, no new changes  All other systems were reviewed with the patient and are negative.  I have reviewed the past medical history, past surgical history, social history and family history with the patient and they are unchanged from previous note.  ALLERGIES:  has No Known Allergies.  MEDICATIONS:  Current Outpatient Prescriptions  Medication Sig Dispense Refill  . acetaminophen (TYLENOL) 325 MG tablet Take 650 mg by mouth every 6 (six) hours as needed for mild pain or moderate pain.    Marland Kitchen albuterol (PROVENTIL) (2.5 MG/3ML) 0.083% nebulizer solution Take 3 mLs (2.5 mg total) by nebulization every 6 (six) hours as needed for wheezing or shortness of breath. 75 mL 12  . antiseptic oral rinse (BIOTENE) LIQD 15 mLs by Mouth Rinse route 2 (two) times daily. 1 Bottle 2  . chlorhexidine (PERIDEX) 0.12 % solution Rinse with 15 mls twice daily for 30 seconds. Use after breakfast and at bedtime. Spit out excess. Do not swallow. 480 mL prn  . emollient (BIAFINE) cream Apply topically as needed.    . fentaNYL (DURAGESIC - DOSED MCG/HR) 50 MCG/HR Place 1 patch (50 mcg total) onto the skin every 3 (three) days. 5 patch 0  . lactulose (  CHRONULAC) 10 GM/15ML solution Take 15 mLs (10 g total) by mouth 2 (two) times daily as needed for mild constipation. 240 mL 2  . lidocaine (XYLOCAINE) 2 % solution Patient: mix 1part 2% viscous lidocaine, 1part H20. Swallow 10 mL of this mixture 30 min before meals and at bedtime, up to QID. 100 mL 3  . magnesium oxide (MAG-OX) 400  (241.3 MG) MG tablet Take 1 tablet (400 mg total) by mouth daily. 60 tablet 1  . Morphine Sulfate (MORPHINE CONCENTRATE) 10 mg / 0.5 ml concentrated solution Take 0.25 mLs (5 mg total) by mouth every 2 (two) hours as needed for severe pain. 60 mL 0  . pantoprazole (PROTONIX) 40 MG tablet Take 1 tablet (40 mg total) by mouth daily. 90 tablet 2  . polyethylene glycol (MIRALAX / GLYCOLAX) packet Take 17 g by mouth daily. 30 each 2  . prochlorperazine (COMPAZINE) 10 MG tablet Take 1 tablet (10 mg total) by mouth every 6 (six) hours as needed for nausea or vomiting. 60 tablet 3  . senna-docusate (SENOKOT-S) 8.6-50 MG per tablet Take 2 tablets by mouth 2 (two) times daily. 120 tablet 0  . temazepam (RESTORIL) 15 MG capsule TAKE 1 CAPSULE BY MOUTH AT BEDTIME AS NEEDED FOR SLEEP 30 capsule 0   No current facility-administered medications for this visit.    PHYSICAL EXAMINATION: ECOG PERFORMANCE STATUS: 1 - Symptomatic but completely ambulatory  Filed Vitals:   03/10/14 1606  BP: 122/84  Pulse: 62  Temp: 98.3 F (36.8 C)  Resp: 18   Filed Weights   03/10/14 1606  Weight: 117 lb 11.2 oz (53.388 kg)    GENERAL:alert, no distress and comfortable SKIN: He has radiation skin dermatitis. No ulceration. Previous neck wound has healed. EYES: normal, Conjunctiva are pink and non-injected, sclera clear OROPHARYNX:no exudate, no erythema and lips, buccal mucosa, and tongue normal . Mild mucositis. No thrush NECK: supple, thyroid normal size, non-tender, without nodularity LYMPH:  no palpable lymphadenopathy in the cervical, axillary or inguinal LUNGS: clear to auscultation and percussion with normal breathing effort HEART: regular rate & rhythm and no murmurs and no lower extremity edema ABDOMEN:abdomen soft, non-tender and normal bowel sounds Musculoskeletal:no cyanosis of digits and no clubbing  NEURO: alert & oriented x 3 with fluent speech, no focal motor/sensory deficits  LABORATORY DATA:  I  have reviewed the data as listed    Component Value Date/Time   NA 138 02/21/2014 1513   NA 133* 11/06/2013 0445   K 4.2 02/21/2014 1513   K 3.5* 11/06/2013 0445   CL 98 11/06/2013 0445   CO2 28 02/21/2014 1513   CO2 21 11/06/2013 0445   GLUCOSE 103 02/21/2014 1513   GLUCOSE 99 11/06/2013 0445   BUN 11.1 02/21/2014 1513   BUN 11 11/06/2013 0445   CREATININE 0.8 02/21/2014 1513   CREATININE 0.63 11/06/2013 0445   CALCIUM 9.7 02/21/2014 1513   CALCIUM 7.0* 11/06/2013 0445   PROT 7.0 02/21/2014 1513   PROT 5.6* 11/03/2013 2220   ALBUMIN 3.8 02/21/2014 1513   ALBUMIN 2.5* 11/03/2013 2220   AST 14 02/21/2014 1513   AST 7 11/03/2013 2220   ALT 9 02/21/2014 1513   ALT 9 11/03/2013 2220   ALKPHOS 72 02/21/2014 1513   ALKPHOS 53 11/03/2013 2220   BILITOT 0.27 02/21/2014 1513   BILITOT 0.6 11/03/2013 2220   GFRNONAA >90 11/06/2013 0445   GFRAA >90 11/06/2013 0445    No results found for: SPEP, UPEP  Lab Results  Component Value Date   WBC 8.1 02/21/2014   NEUTROABS 6.3 02/21/2014   HGB 13.2 02/21/2014   HCT 40.6 02/21/2014   MCV 97.4 02/21/2014   PLT 380 02/21/2014      Chemistry      Component Value Date/Time   NA 138 02/21/2014 1513   NA 133* 11/06/2013 0445   K 4.2 02/21/2014 1513   K 3.5* 11/06/2013 0445   CL 98 11/06/2013 0445   CO2 28 02/21/2014 1513   CO2 21 11/06/2013 0445   BUN 11.1 02/21/2014 1513   BUN 11 11/06/2013 0445   CREATININE 0.8 02/21/2014 1513   CREATININE 0.63 11/06/2013 0445      Component Value Date/Time   CALCIUM 9.7 02/21/2014 1513   CALCIUM 7.0* 11/06/2013 0445   ALKPHOS 72 02/21/2014 1513   ALKPHOS 53 11/03/2013 2220   AST 14 02/21/2014 1513   AST 7 11/03/2013 2220   ALT 9 02/21/2014 1513   ALT 9 11/03/2013 2220   BILITOT 0.27 02/21/2014 1513   BILITOT 0.6 11/03/2013 2220      ASSESSMENT & PLAN:  Cancer of base of tongue He is doing well with minimum side effects from radiation. Continue aggressive supportive care and I  will see him on the weekly basis for supportive care.    Tobacco abuse I spent some time counseling the patient the importance of tobacco cessation. he is currently attempting to quit on his own    Weight loss  I recommend him to increase oral intake. The patient will try to eat more. The dietitian is following closely     No orders of the defined types were placed in this encounter.   All questions were answered. The patient knows to call the clinic with any problems, questions or concerns. No barriers to learning was detected. I spent 15 minutes counseling the patient face to face. The total time spent in the appointment was 20 minutes and more than 50% was on counseling and review of test results     [facility], [place], MD 03/10/2014 8:03 PM

## 2014-03-10 NOTE — Telephone Encounter (Signed)
Gave avs & cal for Nov.  °

## 2014-03-11 ENCOUNTER — Ambulatory Visit
Admission: RE | Admit: 2014-03-11 | Discharge: 2014-03-11 | Disposition: A | Discharge status: Home / Self Care | Payer: Medicaid Other | Source: Ambulatory Visit | Attending: Radiation Oncology | Admitting: Radiation Oncology

## 2014-03-11 DIAGNOSIS — Z510 Encounter for antineoplastic radiation therapy: Secondary | ICD-10-CM | POA: Diagnosis not present

## 2014-03-11 MED ORDER — FENTANYL 50 MCG/HR TD PT72: 50.0000 ug | MEDICATED_PATCH | TRANSDERMAL | Status: DC

## 2014-03-14 ENCOUNTER — Ambulatory Visit
Admission: RE | Admit: 2014-03-14 | Discharge: 2014-03-14 | Disposition: A | Discharge status: Home / Self Care | Payer: Medicaid Other | Source: Ambulatory Visit | Attending: Radiation Oncology | Admitting: Radiation Oncology

## 2014-03-14 ENCOUNTER — Encounter: Payer: Self-pay | Admitting: Radiation Oncology

## 2014-03-14 VITALS — BP 126/76 | HR 62 | Temp 98.3°F | Resp 16 | Ht 65.0 in | Wt 119.0 lb

## 2014-03-14 DIAGNOSIS — C01 Malignant neoplasm of base of tongue: Secondary | ICD-10-CM | POA: Diagnosis not present

## 2014-03-14 DIAGNOSIS — Z510 Encounter for antineoplastic radiation therapy: Secondary | ICD-10-CM | POA: Diagnosis present

## 2014-03-14 MED ORDER — BIAFINE EX EMUL
Freq: Once | CUTANEOUS | Status: AC
  Administered 2014-03-14: 17:00:00 via TOPICAL

## 2014-03-14 NOTE — Progress Notes (Signed)
   Weekly Management Note:  outpatient    ICD-9-CM ICD-10-CM   1. Cancer of base of tongue 141.0 C01 topical emolient (BIAFINE) emulsion    Current Dose:  62 Gy  Projected Dose: 70 Gy   Narrative:  The patient presents for routine under treatment assessment.  CBCT/MVCT images/Port film x-rays were reviewed.  The chart was checked. Doing well.  Only c/o - worsening dysphagia.  Physical Findings:  height is 5\' 5"  (1.651 m) and weight is 119 lb (53.978 kg). His oral temperature is 98.3 F (36.8 C). His blood pressure is 126/76 and his pulse is 62. His respiration is 16 and oxygen saturation is 98%.  NAD, exam unchanged. CBC    Component Value Date/Time   WBC 8.1 02/21/2014 1513   WBC 3.8* 11/06/2013 0445   RBC 4.17* 02/21/2014 1513   RBC 2.91* 11/06/2013 0445   HGB 13.2 02/21/2014 1513   HGB 9.4* 11/06/2013 0445   HCT 40.6 02/21/2014 1513   HCT 26.8* 11/06/2013 0445   PLT 380 02/21/2014 1513   PLT 156 11/06/2013 0445   MCV 97.4 02/21/2014 1513   MCV 92.1 11/06/2013 0445   MCH 31.6 02/21/2014 1513   MCH 32.3 11/06/2013 0445   MCHC 32.5 02/21/2014 1513   MCHC 35.1 11/06/2013 0445   RDW 15.4* 02/21/2014 1513   RDW 14.0 11/06/2013 0445   LYMPHSABS 0.5* 02/21/2014 1513   LYMPHSABS 1.1 11/06/2013 0445   MONOABS 0.7 02/21/2014 1513   MONOABS 0.7 11/06/2013 0445   EOSABS 0.5 02/21/2014 1513   EOSABS 0.0 11/06/2013 0445   BASOSABS 0.1 02/21/2014 1513   BASOSABS 0.0 11/06/2013 0445    CMP     Component Value Date/Time   NA 138 02/21/2014 1513   NA 133* 11/06/2013 0445   K 4.2 02/21/2014 1513   K 3.5* 11/06/2013 0445   CL 98 11/06/2013 0445   CO2 28 02/21/2014 1513   CO2 21 11/06/2013 0445   GLUCOSE 103 02/21/2014 1513   GLUCOSE 99 11/06/2013 0445   BUN 11.1 02/21/2014 1513   BUN 11 11/06/2013 0445   CREATININE 0.8 02/21/2014 1513   CREATININE 0.63 11/06/2013 0445   CALCIUM 9.7 02/21/2014 1513   CALCIUM 7.0* 11/06/2013 0445   PROT 7.0 02/21/2014 1513   PROT 5.6*  11/03/2013 2220   ALBUMIN 3.8 02/21/2014 1513   ALBUMIN 2.5* 11/03/2013 2220   AST 14 02/21/2014 1513   AST 7 11/03/2013 2220   ALT 9 02/21/2014 1513   ALT 9 11/03/2013 2220   ALKPHOS 72 02/21/2014 1513   ALKPHOS 53 11/03/2013 2220   BILITOT 0.27 02/21/2014 1513   BILITOT 0.6 11/03/2013 2220   GFRNONAA >90 11/06/2013 0445   GFRAA >90 11/06/2013 0445      Impression:  The patient is tolerating radiotherapy.  Plan:  Continue radiotherapy as planned. Urged to use Biafine BID or TID for dry skin.  Re-refer to SLP.  Patient does not recall meeting Garald Balding.  F/u 2 wks with me post RT.  ________________________________   Eppie Gibson, M.D.

## 2014-03-14 NOTE — Progress Notes (Signed)
Angel [name] has completed 31 fractions to his oropharynx.  He denies pain.  He is using a 50 mcg fentanyl patch.  He continues to report occasional difficulty swallowing with food not going down and feeling like he is chocking.  He is using lidocaine once a day.  He reports a poor appetite and says he can't taste anything.  He is drinking 2-3 ensures and carnation instant breakfasts daily.  His weight is stable compared to last week.  He reports a dry mouth.  He has a brown coating on his tongue.  The skin on his neck is red with peeling on both sides of his neck.  He is using biafine once a day.  Encouraged him to apply it 2-3 times a day.  He also needs a refill and another tube will be given.  He reports fatigue.

## 2014-03-15 ENCOUNTER — Telehealth: Payer: Self-pay | Admitting: Radiation Oncology

## 2014-03-15 ENCOUNTER — Ambulatory Visit: Payer: Medicaid Other

## 2014-03-15 ENCOUNTER — Ambulatory Visit: Payer: Medicaid Other | Admitting: Nutrition

## 2014-03-15 ENCOUNTER — Ambulatory Visit
Admission: RE | Admit: 2014-03-15 | Discharge: 2014-03-15 | Disposition: A | Discharge status: Home / Self Care | Payer: Medicaid Other | Source: Ambulatory Visit | Attending: Radiation Oncology | Admitting: Radiation Oncology

## 2014-03-15 DIAGNOSIS — Z510 Encounter for antineoplastic radiation therapy: Secondary | ICD-10-CM | POA: Diagnosis not present

## 2014-03-15 NOTE — Telephone Encounter (Signed)
Spoke with [name], patient's wife, to let her know of Mr. Dollar upcoming appt: Barium Swallow on 03/18/14 @ 2:00 & 03/21/14 @ 3:30 to see Garald Balding. She was agreeable.

## 2014-03-15 NOTE — Progress Notes (Signed)
Patient completes treatment this week. Weight documented as 119 pounds.  This is stable.  Patient continues to struggle to get in adequate calories and protein. Lidocaine is helpful for easier swallowing. He continues to drink oral nutrition supplements throughout the day.  Nutrition diagnosis: Unintended weight loss improved.  Intervention: Patient educated to continue strategies for adequate calories and protein. Patient to continue oral nutrition supplements 3-4 times daily as needed. Provided additional samples. Recommended patient contact me if oral intake does not improve after treatment completes.  Monitoring, evaluation, goals: Patient will continue to work to increase oral intake to minimize weight loss after treatment.  Next visit: Patient will call me for scheduling as needed.  **Disclaimer: This note was dictated with voice recognition software. Similar sounding words can inadvertently be transcribed and this note may contain transcription errors which may not have been corrected upon publication of note.**

## 2014-03-16 ENCOUNTER — Other Ambulatory Visit (HOSPITAL_COMMUNITY): Payer: Self-pay | Admitting: Radiation Oncology

## 2014-03-16 ENCOUNTER — Ambulatory Visit: Payer: Medicaid Other

## 2014-03-16 ENCOUNTER — Ambulatory Visit
Admission: RE | Admit: 2014-03-16 | Discharge: 2014-03-16 | Disposition: A | Discharge status: Home / Self Care | Payer: Medicaid Other | Source: Ambulatory Visit | Attending: Radiation Oncology | Admitting: Radiation Oncology

## 2014-03-16 DIAGNOSIS — R1314 Dysphagia, pharyngoesophageal phase: Secondary | ICD-10-CM

## 2014-03-16 DIAGNOSIS — Z510 Encounter for antineoplastic radiation therapy: Secondary | ICD-10-CM | POA: Diagnosis not present

## 2014-03-17 ENCOUNTER — Ambulatory Visit: Payer: Medicaid Other

## 2014-03-17 ENCOUNTER — Ambulatory Visit
Admission: RE | Admit: 2014-03-17 | Discharge: 2014-03-17 | Disposition: A | Discharge status: Home / Self Care | Payer: Medicaid Other | Source: Ambulatory Visit | Attending: Radiation Oncology | Admitting: Radiation Oncology

## 2014-03-17 ENCOUNTER — Other Ambulatory Visit (HOSPITAL_BASED_OUTPATIENT_CLINIC_OR_DEPARTMENT_OTHER): Payer: Medicaid Other

## 2014-03-17 DIAGNOSIS — Z510 Encounter for antineoplastic radiation therapy: Secondary | ICD-10-CM | POA: Diagnosis not present

## 2014-03-17 DIAGNOSIS — C01 Malignant neoplasm of base of tongue: Secondary | ICD-10-CM

## 2014-03-17 LAB — COMPREHENSIVE METABOLIC PANEL (CC13)
ALK PHOS: 54 U/L (ref 40–150)
ALT: 7 U/L (ref 0–55)
AST: 12 U/L (ref 5–34)
Albumin: 3.9 g/dL (ref 3.5–5.0)
Anion Gap: 9 mEq/L (ref 3–11)
BILIRUBIN TOTAL: 0.26 mg/dL (ref 0.20–1.20)
BUN: 8.2 mg/dL (ref 7.0–26.0)
CO2: 27 mEq/L (ref 22–29)
Calcium: 9.3 mg/dL (ref 8.4–10.4)
Chloride: 102 mEq/L (ref 98–109)
Creatinine: 0.9 mg/dL (ref 0.7–1.3)
Glucose: 92 mg/dl (ref 70–140)
Potassium: 4.3 mEq/L (ref 3.5–5.1)
Sodium: 138 mEq/L (ref 136–145)
TOTAL PROTEIN: 6.4 g/dL (ref 6.4–8.3)

## 2014-03-17 LAB — CBC WITH DIFFERENTIAL/PLATELET
BASO%: 1.8 % (ref 0.0–2.0)
Basophils Absolute: 0.1 10*3/uL (ref 0.0–0.1)
EOS%: 11 % — ABNORMAL HIGH (ref 0.0–7.0)
Eosinophils Absolute: 0.6 10*3/uL — ABNORMAL HIGH (ref 0.0–0.5)
HCT: 40.8 % (ref 38.4–49.9)
HGB: 13.3 g/dL (ref 13.0–17.1)
LYMPH%: 8.4 % — AB (ref 14.0–49.0)
MCH: 31.1 pg (ref 27.2–33.4)
MCHC: 32.6 g/dL (ref 32.0–36.0)
MCV: 95.3 fL (ref 79.3–98.0)
MONO#: 0.5 10*3/uL (ref 0.1–0.9)
MONO%: 9.4 % (ref 0.0–14.0)
NEUT#: 3.5 10*3/uL (ref 1.5–6.5)
NEUT%: 69.4 % (ref 39.0–75.0)
PLATELETS: 289 10*3/uL (ref 140–400)
RBC: 4.28 10*6/uL (ref 4.20–5.82)
RDW: 15.1 % — AB (ref 11.0–14.6)
WBC: 5 10*3/uL (ref 4.0–10.3)
lymph#: 0.4 10*3/uL — ABNORMAL LOW (ref 0.9–3.3)

## 2014-03-18 ENCOUNTER — Encounter: Payer: Self-pay | Admitting: Hematology and Oncology

## 2014-03-18 ENCOUNTER — Encounter: Payer: Self-pay | Admitting: Radiation Oncology

## 2014-03-18 ENCOUNTER — Ambulatory Visit
Admission: RE | Admit: 2014-03-18 | Discharge: 2014-03-18 | Disposition: A | Discharge status: Home / Self Care | Payer: Medicaid Other | Source: Ambulatory Visit | Attending: Radiation Oncology | Admitting: Radiation Oncology

## 2014-03-18 ENCOUNTER — Ambulatory Visit: Payer: Medicaid Other

## 2014-03-18 ENCOUNTER — Ambulatory Visit (HOSPITAL_COMMUNITY)
Admission: RE | Admit: 2014-03-18 | Discharge: 2014-03-18 | Disposition: A | Discharge status: Home / Self Care | Payer: Medicaid Other | Source: Ambulatory Visit | Attending: Radiation Oncology | Admitting: Radiation Oncology

## 2014-03-18 ENCOUNTER — Ambulatory Visit (HOSPITAL_BASED_OUTPATIENT_CLINIC_OR_DEPARTMENT_OTHER): Payer: Medicaid Other | Admitting: Hematology and Oncology

## 2014-03-18 ENCOUNTER — Telehealth: Payer: Self-pay | Admitting: Hematology and Oncology

## 2014-03-18 VITALS — BP 132/73 | HR 50 | Temp 97.4°F | Resp 18 | Ht 65.0 in | Wt 118.2 lb

## 2014-03-18 DIAGNOSIS — Z720 Tobacco use: Secondary | ICD-10-CM | POA: Insufficient documentation

## 2014-03-18 DIAGNOSIS — R1313 Dysphagia, pharyngeal phase: Secondary | ICD-10-CM | POA: Insufficient documentation

## 2014-03-18 DIAGNOSIS — G4700 Insomnia, unspecified: Secondary | ICD-10-CM

## 2014-03-18 DIAGNOSIS — R1314 Dysphagia, pharyngoesophageal phase: Secondary | ICD-10-CM

## 2014-03-18 DIAGNOSIS — E43 Unspecified severe protein-calorie malnutrition: Secondary | ICD-10-CM

## 2014-03-18 DIAGNOSIS — R1310 Dysphagia, unspecified: Secondary | ICD-10-CM | POA: Diagnosis present

## 2014-03-18 DIAGNOSIS — C01 Malignant neoplasm of base of tongue: Secondary | ICD-10-CM

## 2014-03-18 DIAGNOSIS — M542 Cervicalgia: Secondary | ICD-10-CM

## 2014-03-18 DIAGNOSIS — C7989 Secondary malignant neoplasm of other specified sites: Secondary | ICD-10-CM | POA: Diagnosis not present

## 2014-03-18 DIAGNOSIS — B3781 Candidal esophagitis: Secondary | ICD-10-CM

## 2014-03-18 DIAGNOSIS — B370 Candidal stomatitis: Secondary | ICD-10-CM

## 2014-03-18 DIAGNOSIS — Z510 Encounter for antineoplastic radiation therapy: Secondary | ICD-10-CM | POA: Diagnosis not present

## 2014-03-18 MED ORDER — BIAFINE EX EMUL
Freq: Two times a day (BID) | CUTANEOUS | Status: DC
  Administered 2014-03-18: 16:00:00 via TOPICAL

## 2014-03-18 MED ORDER — BIAFINE EX EMUL: Freq: Two times a day (BID) | CUTANEOUS | Status: DC

## 2014-03-18 MED ORDER — TEMAZEPAM 15 MG PO CAPS: ORAL_CAPSULE | ORAL | Status: DC

## 2014-03-18 MED ORDER — FENTANYL 25 MCG/HR TD PT72: 25.0000 ug | MEDICATED_PATCH | TRANSDERMAL | Status: DC

## 2014-03-18 NOTE — Telephone Encounter (Signed)
Pt confirmed MD visit per 11/20 POF, gave pt AVS..... KJ
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
## 2014-03-19 NOTE — Assessment & Plan Note (Signed)
He is doing well with minimum side effects from radiation. Continue aggressive supportive care and I will see him every other week for supportive care.

## 2014-03-19 NOTE — Assessment & Plan Note (Signed)
This is improving. Will initiate Fentanyl taper today. He will continue to use morphine prn for breakthrough pain
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
## 2014-03-19 NOTE — Assessment & Plan Note (Signed)
I spent some time counseling the patient the importance of tobacco cessation. he is currently attempting to quit on his own 

## 2014-03-19 NOTE — Assessment & Plan Note (Signed)
Since he was treated with fluconazole for yeast infection, he has gained back some weight. I continue to encourage to patient to increase oral food intake with goal of 120 pounds by the next visit

## 2014-03-21 ENCOUNTER — Ambulatory Visit: Payer: Medicaid Other

## 2014-03-21 ENCOUNTER — Ambulatory Visit: Payer: Medicaid Other | Attending: Radiation Oncology

## 2014-03-21 DIAGNOSIS — R1313 Dysphagia, pharyngeal phase: Secondary | ICD-10-CM | POA: Insufficient documentation

## 2014-03-21 NOTE — Patient Instructions (Signed)
SWALLOWING EXERCISES Do 6 of 7 days per week, until [facility], then 3 times a week after that   1. Effortful Swallows - Squeeze hard with the muscles in your neck while you swallow your  saliva or a sip of water - Repeat up to 15-20 times, 2-3 times a day, and use whenever you eat or drink  2. Masako Swallow - swallow with your tongue sticking out - Stick tongue out and gently bite tongue with your teeth - Swallow, while holding your tongue with your teeth - Repeat up to 15-20 times, 2-3 times a day  3. Pitch Raise - Repeat "he", once per second in as high of a pitch as you can - Repeat up to 20 times, 2-3 times a day  4. Shaker Exercise - head lift - Lie flat on your back in your bed or on a couch without pillows - Raise your head and look at your feet  - KEEP YOUR SHOULDERS DOWN - HOLD FOR 45 SECONDS, then lower your head back down - Repeat 3 times, 2-3 times a day  5. Mendelsohn Maneuver - "half swallow" exercise - Start to swallow, and keep your Adam's apple up by squeezing hard with the muscles of the throat - Hold the squeeze for 3-5 seconds and then relax - Repeat 15 times, 2-3 times a day  6. Tongue Press - Press your entire tongue as hard as you can against the roof of your mouth for 3-5 seconds - Repeat 15-20 times, 2-3 times a day  7. Breath Hold - Say "HUH!" loudly, holding your breath tightly at the level of your voice box for 3 seconds - Repeat 15-20 times, 2-3 times a day  8. Chin pushback - Open your mouth  - Place your fist UNDER your chin near your neck, and push back with your fist for 3-5 seconds - Repeat 15-20 times, 2-3 times a day  Pt was provided paper copy of these exercises 03-21-14  ============================================================================================ Signs of Aspiration Pneumonia   . Chest pain/tightness . Fever (can be low grade) . Cough  o With foul-smelling phlegm (sputum) o With sputum containing pus or  blood o With greenish sputum . Fatigue  . Shortness of breath  . Wheezing   **IF YOU HAVE THESE SIGNS, CONTACT YOUR DOCTOR OR GO TO THE EMERGENCY DEPARTMENT OR URGENT CARE AS SOON AS POSSIBLE**   Pt was provided paper copy of this documentation (aspiration pna) on 03-21-14

## 2014-03-21 NOTE — Therapy (Signed)
Speech Language Pathology Evaluation  Patient Details  Name: Angel [name] MRN: [id number] Date of Birth: [birth year]  Encounter Date: 03/21/2014      End of Session - 03/21/14 1659    Visit Number 1   Number of Visits 4   Date for SLP Re-Evaluation 09/19/14   Authorization Type medicaid - pending auth   SLP Start Time 9562   SLP Time Calculation (min) 1415   SLP Time Calculation (min) 1362 min      Past Medical History  Diagnosis Date  . Constipation due to pain medication   . Malignant neoplasm of base of tongue     left neck lymph and Left BOT cancer  . Current smoker   . 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 Insomnia 11/12/2013  . Hypomagnesemia 11/12/2013  . Thrush of mouth and esophagus 02/21/2014    Past Surgical History  Procedure Laterality Date  . Testicle surgery      as a infant  . Appendectomy    . Mandible surgery      from [place]  . Back surgery      Lumbar  . Tonsillectomy    . Panendoscopy N/A 10/04/2013    Procedure: Direct Laryngoscopy  WITH BIOPSY;  Surgeon: Melida Quitter, MD;  Location: [place];  Service: ENT;  Laterality: N/A;  . Esophagoscopy  10/04/2013    Procedure: ESOPHAGOSCOPY;  Surgeon: Melida Quitter, MD;  Location: [place];  Service: ENT;;  . Multiple extractions with alveoloplasty N/A 10/08/2013    Procedure: extraction of tooth #'s 2,3,4,5,6,11,12,13,14,15,18,19,20,21,22,23,24,25,26, 27,28, 29, 31 with alveoloplasty ;  Surgeon: Lenn Cal, DDS;  Location: [place];  Service: Oral Surgery;  Laterality: N/A;    There were no vitals taken for this visit.  Visit Diagnosis: Dysphagia, pharyngeal      Subjective Assessment - 03/21/14 1615    Symptoms "I just eat soft foods, and drinks." Pt is endentulous. He also reports x2-3 per meal a feeling of either gagging or of a stoppage in what appears to be UES (upon pt pointing to where he feels this sensation). He believes this  sensation to be "in my head". He regurgitates bolus and reswallows without further incident 100% of the time.    Pt also       SLP Evaluation [facility] - 03/21/14 1616    Pain Assessment   Currently in Pain? 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  Hx includes May 2015 dx SCCA involving the left neck with large tongue base mass that extended into the vallecula and superior aspect of the pre-epiglottic space, by CT. CT of the neck on 09/17/13 showed:  Large tongue base malignancy with necrotic, bulky metastatic cervical lymphadenopathy, left greater than right. Chemo completed learly-mid October, rad tx completed 03-18-14. Pt now on soft diet with thin liquids.    Behavioral/Cognition Pleasant and cooperative.   Mobility Status Ambulatory     Pt currently tolerates soft diet with thin liquids. Modified barium swallow eval 03-18-14 rec this diet with small sips/bites, slow rate and alternate bite/sip. Oral motor assessment revealed WNL lingual ROM and strength, as well as WNL labial ROM and strength. Velar ROM appeared WNL. POs: Pt without overt s/s aspiration with water today, and took small sips. Thyroid elevation appeared WFL, and swallows appeared timely. Pt's swallow deemed [facility]  at this time.   Because data states the risk for dysphagia during and after radiation treatment is high due to undergoing radiation tx, SLP taught pt about the possibility of reduced/limited ability for PO intake during rad tx. SLP encouraged pt to continue swallowing POs as far into rad tx as possible, even ingesting POs and/or completing HEP shortly after administration of pain meds.   SLP educated pt re: changes to swallowing musculature after rad tx, and why adherence to dysphagia HEP provided today and PO consumption was necessary to inhibit muscular disuse atrophy and to reduce muscle fibrosis following rad tx. Pt demonstrated understanding  of these things to SLP. Further education was provided [id number] PNA signs/symptoms, and late effects head/neck radiation.   After eval tasks, SLP then developed a HEP for pt and pt was instructed how to perform exercises involving lingual, vocal, and pharyngeal strengthening. SLP performed each exercise and pt return demonstrated each exercise. SLP ensured pt performance was correct prior to moving on to next exercise. Pt was instructed to complete this program 2-3 times a day, 6-7 days/week until 60 days after their last rad tx, then x3 a week after that.        SLP Education - 03/21/14 1658    Education provided Yes   Education Details HEP, late effects radiation on swallowing, aspiration PNA signs/symptoms   Person(s) Educated Patient   Methods Explanation;Demonstration;Verbal cues   Comprehension Verbalized understanding;Verbal cues required;Returned demonstration          SLP Short Term Goals - 03/21/14 1701    SLP SHORT TERM GOAL #1   Title perform HEP with rare min A   Baseline total assist   Time 2   Period Months   Status New   SLP SHORT TERM GOAL #2   Title tell SLP signs/symptoms aspiration PNA with modified independence   Baseline total assist   Time 1   Period Months   Status New   SLP SHORT TERM GOAL #3   Title pt will tell SLP why he is completing HEP   Baseline total assist   Time 1   Period Months   Status New          SLP Long Term Goals - 03/21/14 1702    SLP LONG TERM GOAL #1   Title pt will perform HEP with modified independence   Baseline toatl assist   Time 2   Period Months   Status New   SLP LONG TERM GOAL #2   Title pt will tell SLP benefits from keeping a food journal to expediate return to safest deit   Baseline not given yet   Time 3   Period Months   Status New          Plan - 03/21/14 1659    Clinical Impression Statement pt presents with mild dysphagia diagnosed at modified barium swallow eval Friday 03-18-14. Skilled  ST needed to verify continued procedural success with HEP and to assess safety with POs after rad tx   Speech Therapy Frequency --  approx every 4-6 weeks   Duration --  x 3 visits   Treatment/Interventions Aspiration precaution training;SLP instruction and feedback;Pharyngeal strengthening exercises;Oral motor exercises;Trials of upgraded texture/liquids;Patient/family education   Potential to Achieve Goals Good   SLP Home Exercise Plan HEP provided today   Consulted and Agree with Plan of Care Patient        Problem List Patient Active Problem List   Diagnosis Date Noted  .  Thrush of mouth and esophagus 02/21/2014  . Mucositis due to antineoplastic therapy 02/21/2014  . Headache 01/25/2014  . Tobacco abuse 01/25/2014  . Preventive measure 01/01/2014  . Diarrhea 11/15/2013  . Bilateral leg edema 11/15/2013  . Wound discharge 11/13/2013  . Insomnia 11/12/2013  . Hypomagnesemia 11/12/2013  . Generalized weakness 10/27/2013  . Fall 10/27/2013  . Thrombocytosis 10/27/2013  . Protein-calorie malnutrition, severe 10/26/2013  . Leukocytosis, unspecified 10/25/2013  . Neck pain 10/14/2013  . Anemia in neoplastic disease 10/05/2013  . Weight loss 10/05/2013  . Cancer of base of tongue 09/17/2013               Garald Balding, MS, [place] Phone: [phone] Fax: [phone]  03/21/2014, 5:06 PM

## 2014-03-23 NOTE — Progress Notes (Signed)
  Radiation Oncology         [phone]) [phone] ________________________________  Name: Angel [name] MRN: [id number]  Date: 03/18/2014  DOB: [birth year]  End of Treatment Note  Diagnosis: T2N3M0 IVB squamous cell carcinoma, base of tongue  Indication for treatment:  Curative, following induction chemotherapy       Radiation treatment dates:   01/26/2014-03/18/2014  Site/dose:   Base of tongue and bilateral neck / 70 Gy in 35 fractions to gross disease, 63 Gy in 35 fractions to high risk nodal echelons, and 56 Gy in 35 fractions to intermediate risk nodal echelons  Beams/energy:   Helical IMRT / 6 MV photons   Narrative: The patient tolerated radiation treatment relatively well.     Plan: The patient has completed radiation treatment. The patient will return to radiation oncology clinic for routine followup in one half month. I advised them to call or return sooner if they have any questions or concerns related to their recovery or treatment.  -----------------------------------  Eppie Gibson, MD

## 2014-03-29 ENCOUNTER — Telehealth: Payer: Self-pay | Admitting: *Deleted

## 2014-03-29 NOTE — Telephone Encounter (Signed)
Called patient in f/u to his final RT on 11/20, spoke with his wife as he was sleeping.  She reported that he has been doing well, sleeping a lot.  she denied any needs/concerns.  She understands that they can contact me.  Gayleen Orem, RN, BSN, [facility] [place] [phone]

## 2014-03-30 ENCOUNTER — Telehealth: Payer: Self-pay | Admitting: *Deleted

## 2014-03-30 NOTE — Telephone Encounter (Signed)
Notified pt of appt moved to 3 pm tomorrow.  He is aware and ok w/ that.

## 2014-03-31 ENCOUNTER — Telehealth: Payer: Self-pay | Admitting: Hematology and Oncology

## 2014-03-31 ENCOUNTER — Ambulatory Visit (HOSPITAL_BASED_OUTPATIENT_CLINIC_OR_DEPARTMENT_OTHER): Payer: Medicaid Other | Admitting: Hematology and Oncology

## 2014-03-31 VITALS — BP 131/69 | HR 60 | Temp 97.7°F | Resp 18 | Ht 66.0 in | Wt 117.1 lb

## 2014-03-31 DIAGNOSIS — K1231 Oral mucositis (ulcerative) due to antineoplastic therapy: Secondary | ICD-10-CM

## 2014-03-31 DIAGNOSIS — Z720 Tobacco use: Secondary | ICD-10-CM

## 2014-03-31 DIAGNOSIS — B370 Candidal stomatitis: Secondary | ICD-10-CM

## 2014-03-31 DIAGNOSIS — M542 Cervicalgia: Secondary | ICD-10-CM

## 2014-03-31 DIAGNOSIS — C01 Malignant neoplasm of base of tongue: Secondary | ICD-10-CM

## 2014-03-31 DIAGNOSIS — B3781 Candidal esophagitis: Secondary | ICD-10-CM

## 2014-03-31 MED ORDER — FENTANYL 12 MCG/HR TD PT72: 12.0000 ug | MEDICATED_PATCH | TRANSDERMAL | Status: DC

## 2014-03-31 NOTE — Assessment & Plan Note (Signed)
He is doing well with no clinical signs of disease. I will continue supportive care and see him on a monthly basis.

## 2014-03-31 NOTE — Assessment & Plan Note (Signed)
This has resolved.

## 2014-03-31 NOTE — Telephone Encounter (Signed)
LEFT MESSAGE FOR PATIENT AND GAVE NEW TIME FOR APPT 12/03 @ 3 W/DR. [place]

## 2014-03-31 NOTE — Assessment & Plan Note (Signed)
I spent some time counseling the patient the importance of tobacco cessation. he is currently attempting to quit on his own 

## 2014-03-31 NOTE — Progress Notes (Signed)
[place] OFFICE PROGRESS NOTE  Patient Care Team: [name], MD as PCP - General (Hematology and Oncology) Brooks Sailors, RN as Oncology Nurse Navigator  SUMMARY OF ONCOLOGIC HISTORY: Oncology History   Cancer of base of tongue, HPV negative   Primary site: Pharynx - Oropharynx   Staging method: AJCC 7th Edition   Clinical: Stage IVB (T2, N3, M0) signed by [name], MD on 10/15/2013 10:25 AM   Summary: Stage IVB (T2, N3, M0)       Cancer of base of tongue   09/17/2013 Imaging CT scan of the neck showed large bilateral neck lymphadenopathy with associated tongue mass.   09/23/2013 Procedure Fine needle aspirate of the left neck mass came back positive for squamous cell carcinoma.   10/04/2013 Surgery Laryngoscopy showed a firm mass within the left tongue base extending past the midline and encompassing much of the tongue base.  There is extension of the mass into the vallecula and on to the lingual surface of the epiglottis.   10/04/2013 Pathology Results Vallecula and tongue biopsy confirmed squamous cell carcinoma.   10/15/2013 Imaging PET/CT scan showed no evidence of distant metastatic disease apart from just regional lymph node involvement.   10/25/2013 - 11/01/2013 Hospital Admission He was admitted to the hospital due to malignant hypercalcemia and treatment was initiated.   10/27/2013 - 12/13/2013 Chemotherapy He was given 3 cycles of induction chemo with cisplatin, taxotere, 5 FU   11/03/2013 - 11/07/2013 Hospital Admission He was hospitalized for dehydration   12/31/2013 Procedure PICC line was removed   01/10/2014 Imaging PET Ct scan showed near complete response to treatment   01/26/2014 - 03/18/2014 Radiation Therapy He was treated with radiation    INTERVAL HISTORY: Please see below for problem oriented charting. He is seen as part of his supportive care visits. He is doing well. Denies further wound discharge. He has persistent left neck pain, improved. He has  difficulties lifting his left shoulder due to reduced mobility from pain. Denies recent mucositis. No dysphagia.  REVIEW OF SYSTEMS:   Constitutional: Denies fevers, chills or abnormal weight loss Eyes: Denies blurriness of vision Ears, nose, mouth, throat, and face: Denies mucositis or sore throat Respiratory: Denies cough, dyspnea or wheezes Cardiovascular: Denies palpitation, chest discomfort or lower extremity swelling Gastrointestinal:  Denies nausea, heartburn or change in bowel habits Skin: Denies abnormal skin rashes Lymphatics: Denies new lymphadenopathy or easy bruising Neurological:Denies numbness, tingling or new weaknesses Behavioral/Psych: Mood is stable, no new changes  All other systems were reviewed with the patient and are negative.  I have reviewed the past medical history, past surgical history, social history and family history with the patient and they are unchanged from previous note.  ALLERGIES:  has No Known Allergies.  MEDICATIONS:  Current Outpatient Prescriptions  Medication Sig Dispense Refill  . acetaminophen (TYLENOL) 325 MG tablet Take 650 mg by mouth every 6 (six) hours as needed for mild pain or moderate pain.    Marland Kitchen albuterol (PROVENTIL) (2.5 MG/3ML) 0.083% nebulizer solution Take 3 mLs (2.5 mg total) by nebulization every 6 (six) hours as needed for wheezing or shortness of breath. 75 mL 12  . antiseptic oral rinse (BIOTENE) LIQD 15 mLs by Mouth Rinse route 2 (two) times daily. 1 Bottle 2  . chlorhexidine (PERIDEX) 0.12 % solution Rinse with 15 mls twice daily for 30 seconds. Use after breakfast and at bedtime. Spit out excess. Do not swallow. 480 mL prn  . emollient (BIAFINE) cream  Apply topically as needed.    . fentaNYL (DURAGESIC - DOSED MCG/HR) 12 MCG/HR Place 1 patch (12.5 mcg total) onto the skin every 3 (three) days. 10 patch 0  . lactulose (CHRONULAC) 10 GM/15ML solution Take 15 mLs (10 g total) by mouth 2 (two) times daily as needed for mild  constipation. 240 mL 2  . lidocaine (XYLOCAINE) 2 % solution Patient: mix 1part 2% viscous lidocaine, 1part H20. Swallow 10 mL of this mixture 30 min before meals and at bedtime, up to QID. 100 mL 3  . magnesium oxide (MAG-OX) 400 (241.3 MG) MG tablet Take 1 tablet (400 mg total) by mouth daily. 60 tablet 1  . Morphine Sulfate (MORPHINE CONCENTRATE) 10 mg / 0.5 ml concentrated solution Take 0.25 mLs (5 mg total) by mouth every 2 (two) hours as needed for severe pain. 60 mL 0  . pantoprazole (PROTONIX) 40 MG tablet Take 1 tablet (40 mg total) by mouth daily. 90 tablet 2  . polyethylene glycol (MIRALAX / GLYCOLAX) packet Take 17 g by mouth daily. 30 each 2  . prochlorperazine (COMPAZINE) 10 MG tablet Take 1 tablet (10 mg total) by mouth every 6 (six) hours as needed for nausea or vomiting. 60 tablet 3  . senna-docusate (SENOKOT-S) 8.6-50 MG per tablet Take 2 tablets by mouth 2 (two) times daily. 120 tablet 0  . temazepam (RESTORIL) 15 MG capsule TAKE 1 CAPSULE BY MOUTH AT BEDTIME AS NEEDED FOR SLEEP 30 capsule 0   No current facility-administered medications for this visit.    PHYSICAL EXAMINATION: ECOG PERFORMANCE STATUS: 0 - Asymptomatic  Filed Vitals:   03/31/14 1507  BP: 131/69  Pulse: 60  Temp: 97.7 F (36.5 C)  Resp: 18   Filed Weights   03/31/14 1507  Weight: 117 lb 1.6 oz (53.116 kg)    GENERAL:alert, no distress and comfortable. He looks thin. SKIN: skin color, texture, turgor are normal, no rashes or significant lesions EYES: normal, Conjunctiva are pink and non-injected, sclera clear OROPHARYNX:no exudate, no erythema and lips, buccal mucosa, and tongue normal  NECK: supple, thyroid normal size, non-tender, without nodularity. Prior wound discharge has healed. LYMPH:  no palpable lymphadenopathy in the cervical, axillary or inguinal LUNGS: clear to auscultation and percussion with normal breathing effort HEART: regular rate & rhythm and no murmurs and no lower extremity  edema ABDOMEN:abdomen soft, non-tender and normal bowel sounds Musculoskeletal:no cyanosis of digits and no clubbing  NEURO: alert & oriented x 3 with fluent speech, no focal motor/sensory deficits  LABORATORY DATA:  I have reviewed the data as listed    Component Value Date/Time   NA 138 03/17/2014 1616   NA 133* 11/06/2013 0445   K 4.3 03/17/2014 1616   K 3.5* 11/06/2013 0445   CL 98 11/06/2013 0445   CO2 27 03/17/2014 1616   CO2 21 11/06/2013 0445   GLUCOSE 92 03/17/2014 1616   GLUCOSE 99 11/06/2013 0445   BUN 8.2 03/17/2014 1616   BUN 11 11/06/2013 0445   CREATININE 0.9 03/17/2014 1616   CREATININE 0.63 11/06/2013 0445   CALCIUM 9.3 03/17/2014 1616   CALCIUM 7.0* 11/06/2013 0445   PROT 6.4 03/17/2014 1616   PROT 5.6* 11/03/2013 2220   ALBUMIN 3.9 03/17/2014 1616   ALBUMIN 2.5* 11/03/2013 2220   AST 12 03/17/2014 1616   AST 7 11/03/2013 2220   ALT 7 03/17/2014 1616   ALT 9 11/03/2013 2220   ALKPHOS 54 03/17/2014 1616   ALKPHOS 53 11/03/2013 2220  BILITOT 0.26 03/17/2014 1616   BILITOT 0.6 11/03/2013 2220   GFRNONAA >90 11/06/2013 0445   GFRAA >90 11/06/2013 0445    No results found for: SPEP, UPEP  Lab Results  Component Value Date   WBC 5.0 03/17/2014   NEUTROABS 3.5 03/17/2014   HGB 13.3 03/17/2014   HCT 40.8 03/17/2014   MCV 95.3 03/17/2014   PLT 289 03/17/2014      Chemistry      Component Value Date/Time   NA 138 03/17/2014 1616   NA 133* 11/06/2013 0445   K 4.3 03/17/2014 1616   K 3.5* 11/06/2013 0445   CL 98 11/06/2013 0445   CO2 27 03/17/2014 1616   CO2 21 11/06/2013 0445   BUN 8.2 03/17/2014 1616   BUN 11 11/06/2013 0445   CREATININE 0.9 03/17/2014 1616   CREATININE 0.63 11/06/2013 0445      Component Value Date/Time   CALCIUM 9.3 03/17/2014 1616   CALCIUM 7.0* 11/06/2013 0445   ALKPHOS 54 03/17/2014 1616   ALKPHOS 53 11/03/2013 2220   AST 12 03/17/2014 1616   AST 7 11/03/2013 2220   ALT 7 03/17/2014 1616   ALT 9 11/03/2013 2220    BILITOT 0.26 03/17/2014 1616   BILITOT 0.6 11/03/2013 2220      ASSESSMENT & PLAN:  Cancer of base of tongue He is doing well with no clinical signs of disease. I will continue supportive care and see him on a monthly basis.  Mucositis due to antineoplastic therapy This has resolved.  Neck pain The patient has signs of reduced mobility over his left neck. Recommend physical exercise. I will start to initiate pain medication taper.  Tobacco abuse I spent some time counseling the patient the importance of tobacco cessation. he is currently attempting to quit on his own     No orders of the defined types were placed in this encounter.   All questions were answered. The patient knows to call the clinic with any problems, questions or concerns. No barriers to learning was detected. I spent 25 minutes counseling the patient face to face. The total time spent in the appointment was 30 minutes and more than 50% was on counseling and review of test results     [facility], Soumya Colson, MD 03/31/2014 8:21 PM

## 2014-03-31 NOTE — Telephone Encounter (Signed)
Pt confirmed labs/ov per 12/03 POF, gave pt AVS... KJ °

## 2014-03-31 NOTE — Assessment & Plan Note (Signed)
The patient has signs of reduced mobility over his left neck. Recommend physical exercise. I will start to initiate pain medication taper.

## 2014-04-01 ENCOUNTER — Encounter: Payer: Self-pay | Admitting: Radiation Oncology

## 2014-04-01 ENCOUNTER — Ambulatory Visit: Payer: Medicaid Other | Admitting: Radiation Oncology

## 2014-04-04 ENCOUNTER — Ambulatory Visit
Admission: RE | Admit: 2014-04-04 | Discharge: 2014-04-04 | Disposition: A | Discharge status: Home / Self Care | Payer: Medicaid Other | Source: Ambulatory Visit | Attending: Radiation Oncology | Admitting: Radiation Oncology

## 2014-04-04 ENCOUNTER — Encounter: Payer: Self-pay | Admitting: Radiation Oncology

## 2014-04-04 VITALS — BP 104/78 | HR 68 | Temp 98.6°F | Ht 66.0 in | Wt 112.1 lb

## 2014-04-04 DIAGNOSIS — Z9221 Personal history of antineoplastic chemotherapy: Secondary | ICD-10-CM | POA: Diagnosis not present

## 2014-04-04 DIAGNOSIS — Z85810 Personal history of malignant neoplasm of tongue: Secondary | ICD-10-CM | POA: Insufficient documentation

## 2014-04-04 DIAGNOSIS — K08109 Complete loss of teeth, unspecified cause, unspecified class: Secondary | ICD-10-CM | POA: Diagnosis not present

## 2014-04-04 DIAGNOSIS — Z08 Encounter for follow-up examination after completed treatment for malignant neoplasm: Secondary | ICD-10-CM | POA: Diagnosis not present

## 2014-04-04 DIAGNOSIS — R634 Abnormal weight loss: Secondary | ICD-10-CM

## 2014-04-04 DIAGNOSIS — Z923 Personal history of irradiation: Secondary | ICD-10-CM | POA: Diagnosis not present

## 2014-04-04 DIAGNOSIS — C01 Malignant neoplasm of base of tongue: Secondary | ICD-10-CM

## 2014-04-04 DIAGNOSIS — F17210 Nicotine dependence, cigarettes, uncomplicated: Secondary | ICD-10-CM | POA: Diagnosis not present

## 2014-04-04 HISTORY — DX: Personal history of irradiation: Z92.3

## 2014-04-04 MED ORDER — LIDOCAINE VISCOUS 2 % MT SOLN: OROMUCOSAL | Status: DC

## 2014-04-04 MED ORDER — BIAFINE EX EMUL
Freq: Two times a day (BID) | CUTANEOUS | Status: DC
  Administered 2014-04-04: 14:00:00 via TOPICAL

## 2014-04-04 NOTE — Progress Notes (Signed)
Angel [name] is here for reassessment s/p radiation therapy for Squamous Cell Carcinoma of the base of Tongue.  He denies any mouth or throat pain and denies any pain upon swallowing.  He admits to haviing a decrease in appetite and he has lost 5 lbs since 03/31/14.   His oral mucosa is moist and intact and he reports minimal dry mouth. He reports fatigue and takes naps during the day and, intermittently, he has difficulty sleeping at night.  "I have no routine in my life".
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
## 2014-04-05 ENCOUNTER — Telehealth: Payer: Self-pay | Admitting: *Deleted

## 2014-04-05 NOTE — Telephone Encounter (Signed)
CALLED PATIENT TO INFORM OF LAB, TEST , FU AND NUTRITION APPT.,  LVM FOR A RETURN CALL

## 2014-04-06 ENCOUNTER — Encounter (HOSPITAL_COMMUNITY): Payer: Self-pay | Admitting: Dentistry

## 2014-04-06 ENCOUNTER — Ambulatory Visit (HOSPITAL_COMMUNITY): Payer: Medicaid - Dental | Admitting: Dentistry

## 2014-04-06 VITALS — BP 123/61 | HR 46 | Temp 97.8°F | Wt 112.0 lb

## 2014-04-06 DIAGNOSIS — K117 Disturbances of salivary secretion: Secondary | ICD-10-CM

## 2014-04-06 DIAGNOSIS — Z9221 Personal history of antineoplastic chemotherapy: Secondary | ICD-10-CM

## 2014-04-06 DIAGNOSIS — R432 Parageusia: Secondary | ICD-10-CM

## 2014-04-06 DIAGNOSIS — Z0189 Encounter for other specified special examinations: Secondary | ICD-10-CM

## 2014-04-06 DIAGNOSIS — M264 Malocclusion, unspecified: Secondary | ICD-10-CM

## 2014-04-06 DIAGNOSIS — C01 Malignant neoplasm of base of tongue: Secondary | ICD-10-CM

## 2014-04-06 DIAGNOSIS — Z923 Personal history of irradiation: Secondary | ICD-10-CM

## 2014-04-06 DIAGNOSIS — K08109 Complete loss of teeth, unspecified cause, unspecified class: Secondary | ICD-10-CM

## 2014-04-06 DIAGNOSIS — R682 Dry mouth, unspecified: Secondary | ICD-10-CM

## 2014-04-06 DIAGNOSIS — K0820 Unspecified atrophy of edentulous alveolar ridge: Secondary | ICD-10-CM

## 2014-04-06 NOTE — Progress Notes (Signed)
04/06/2014  Patient Name:   Angel [name] Date of Birth:   [birth year] Medical Record Number: [id number]  BP 123/61 mmHg  Pulse 46  Temp(Src) 97.8 F (36.6 C) (Oral)  Wt 112 lb (50.803 kg)  Angel [name] presents for oral examination after radiation therapy. Patient completed radiation treatments from 01/26/14 through 03/18/14. Patient completed induction chemotherapy treatment with Dr. Alvy Bimler prior to radiation treatments.  REVIEW OF CHIEF COMPLAINTS:  DRY MOUTH: Yes HARD TO SWALLOW: No  HURT TO SWALLOW: No TASTE CHANGES: Taste is slowly returning SORES IN MOUTH: No TRISMUS: No problems with trismus. WEIGHT: 112 pounds from 128 pounds at start of treatment. Recent 4-5 lb weigh loss by report. Will refer to [place]:  BRUSHING: Not applicable FLOSSING: Not applicable RINSING: Using salt water and baking soda rinses and Biotene rinses as needed. FLUORIDE: Not applicable TRISMUS EXERCISES:  Maximum interincisal opening: 45 mm. Trismus device was prefabricated for the patient today.   DENTAL EXAM:  Oral Hygiene:(PLAQUE): Edentulous. Patient advised to brush his tongue daily. LOCATION OF MUCOSITIS: None noted DESCRIPTION OF SALIVA: Incipient xerostomia ANY EXPOSED BONE: None noted OTHER WATCHED AREAS: Previous extraction sites DX: Xerostomia, Dysgeusia, Weight Loss and Edentulous, atropy of alveolar ridges  RECOMMENDATIONS: 1. Brush tongue daily. 2. Use trismus exercises as directed. 3. Use Biotene Rinse or salt water/baking soda rinses. 4. Multiple sips of water as needed. 5. Return to clinic in two months for start of upper and lower dentures after Medicaid  prior approval oral exam after radiation therapy. 6. Refer patient to Ernestene Kiel for nutrition evaluation and consideration of Breeze and Ensure Active products. Call if problems before then.  Lenn Cal, DDS

## 2014-04-06 NOTE — Patient Instructions (Addendum)
RECOMMENDATIONS: 1. Brush tongue daily. 2. Use trismus exercises as directed. 3. Use Biotene Rinse or salt water/baking soda rinses. 4. Multiple sips of water as needed. 5. Return to clinic in two months for start of upper and lower dentures after Medicaid  prior approval oral exam after radiation therapy. 6. Refer patient to Ernestene Kiel for nutrition evaluation and consideration of Breeze and Ensure Active products. Call if problems before then.  Lenn Cal, DDS   RADIATION THERAPY AND DECISIONS REGARDING YOUR TEETH  Xerostomia (dry mouth) Your salivary glands may be in the filed of radiation.  Radiation may include all or part of your saliva glands.  This will cause your saliva to dry up and you will have a dry mouth.  The dry mouth will be for the rest of your life unless your radiation oncologist tells you otherwise.  Your saliva has many functions:  Saliva wets your tongue for speaking.  It coats your teeth and the inside of your mouth for easier movement.  It helps with chewing and swallowing food.  It helps clean away harmful acid and toxic products made by the germs in your mouth, therefore it helps prevent cavities.  It kills some germs in your mouth and helps to prevent gum disease.  It helps to carry flavor to your taste buds.  Once you have lost your saliva you will be at higher risk for tooth decay and gum disease.  What can be done to help improve your mouth when there's not enough saliva:  1.  Your dentist may give a prescription for Salagen.  It will not bring back all of your saliva but may bring back some of it.  Also your saliva may be thick and ropy or white and foamy. It will not feel like it use to feel.  2.  You will need to swish with water every time your mouth feels dry.  YOU CANNOT suck on any cough drops, mints, lemon drops, candy, vitamin C or any other products.  You cannot use anything other than water to make your mouth feel less dry.  If  you want to drink anything else you have to drink it all at once and brush afterwards.  Be sure to discuss the details of your diet habits with your dentist or hygienist.  Radiation caries: This is decay that happens very quickly once your mouth is very dry due to radiation therapy.  Normally cavities take six months to two years to become a problem.  When you have dry mouth cavities may take as little as eight weeks to cause you a problem.  This is why dental check ups every two months are necessary as long as you have a dry mouth. Radiation caries typically, but not always, start at your gum line where it is hard to see the cavity.  It is therefore also hard to fill these cavities adequately.  This high rate of cavities happens because your mouth no longer has saliva and therefore the acid made by the germs starts the decay process.  Whenever you eat anything the germs in your mouth change the food into acid.  The acid then burns a small hole in your tooth.  This small hole is the beginning of a cavity.  If this is not treated then it will grow bigger and become a cavity.  The way to avoid this hole getting bigger is to use fluoride every evening as prescribed by your dentist.  You have to  make sure that your teeth are very clean before you use the fluoride.  This fluoride in turn will strengthen your teeth and prepare them for another day of fighting acid.  If you develop radiation caries many times the damage is so large that you will have to have all your teeth removed.  This could be a big problem if some of these teeth are in the field of radiation.  Further details of why this could be a big problem will follow.  (See Osteoradionecrosis).  Loss of taste (dysgeusia) This happens to varying degrees once you've had radiation therapy to your jaw region.  Many times taste is not completely lost but becomes limited.  The loss of taste is mostly due to radiation affecting your taste buds.  However if you  have no saliva in your mouth to carry the flavor to your taste buds it would be difficult for your taste buds to taste anything.  That is why using water or a prescription for Salagen prior to meals and during meals may help with some of the taste.  Keep in mind that taste generally returns very slowly over the course of several months or several years after radiation therapy.  Don't give up hope.  Trismus According to your Radiation Oncologist your TMJ or jaw joints are going to be partially or fully in the field of radiation.  This means that over time the muscles that help you open and close your mouth may get stiff.  This will potentially result in your not being able to open your mouth wide enough or as wide as you can open it now.  Le me give you an example of how slowly this happens and how unaware people are of it.  A gentlemen that had radiation therapy two years ago came back to me complaining that bananas are just too large for him to be able to fit them in between his teeth.  He was not able to open wide enough to bite into a banana.  This happens slowly and over a period of time.  What do we do to try and prevent this?  Your dentist will probably give you a stack of sticks called a trismus exercise device .  This stack will help your remind your muscles and your jaw joint to open up to the same distance every day.  Use these sticks every morning when you wake up according to the instructions given by the dentist.   You must use these sticks for at least one to two years after radiation therapy.  The reason for that is because it happens so slowly and keeps going on for about two years after radiation therapy.  Your hospital dentist will help you monitor your mouth opening and make sure that it's not getting smaller.  Osteoradionecrosis (ORN) This is a condition where your jaw bone after having had radiation therapy becomes very dry.  It has very little blood supply to keep it alive.  If you  develop a cavity that turns into an abscess or an infection then the jaw bone does not have enough blood supply to help fight the infection.  At this point it is very likely that the infection could cause the death of your jaw bone.  When you have dead bone it has to be removed.  Therefore you might end up having to have surgery to remove part of your jaw bone, the part of the jaw bone that has been affected.  Healing is also a problem if you are to have surgery in the areas where the bone has had radiation therapy.  The same reasons apply.  If you have surgery you need more blood supply which is not available.  When blood supply and oxygen are not available again, there is a chance for the bone to die.  Occasionally ORN happens on its own with no obvious reason.  This is quite rare.  We believe that patients who continue to smoke and/or drink alcohol have a higher chance of having this bone problem.  Therefore once your jaw bone has had radiation therapy if there are any teeth in that area, you should never have them pulled.  You should also never have any surgery on your teeth or gums in that area unless the oral surgeon or Periodontist is aware of your history of radiation. There is some expensive management techniques that might be used to limit your risks.  The risks for ORN either from infection or spontaneous ( or on it's own) are life long.    TRISMUS  Trismus is a condition where the jaw does not allow the mouth to open as wide as it usually does.  This can happen almost suddenly, or in other cases the process is so slow, it is hard to notice it-until it is too far along.  When the jaw joints and/or muscles have been exposed to radiation treatments, the onset of Trismus is very slow.  This is because the muscles are losing their stretching ability over a long period of time, as long as 2 YEARS after the end of radiation.  It is therefore important to exercise these muscles and joints.  TRISMUS  EXERCISES   Stack of tongue depressors measuring the same or a little less than the last documented MIO (Maximum Interincisal Opening).  Secure them with a rubber band on both ends.  Place the stack in the patient's mouth, supporting the other end.  Allow 30 seconds for muscle stretching.  Rest for a few seconds.  Repeat 3-5 times  For all radiation patients, this exercise is recommended in the mornings and evenings unless otherwise instructed.  The exercise should be done for a period of 2 YEARS after the end of radiation.  MIO should be checked routinely on recall dental visits by the general dentist or the hospital dentist.  The patient is advised to report any changes, soreness, or difficulties encountered when doing the exercises.

## 2014-04-14 ENCOUNTER — Ambulatory Visit (HOSPITAL_COMMUNITY): Payer: Medicaid Other

## 2014-04-14 ENCOUNTER — Ambulatory Visit: Payer: Medicaid Other | Admitting: Nutrition

## 2014-04-14 NOTE — Progress Notes (Signed)
Follow up completed with patient and wife. Patient reports no trouble swallowing.  His appetite is improving. States he is getting dentures on Feb 22. Is drinking oral nutrition supplements BID to TID. Weight increased to 114.2 pounds today from 112 pounds on Dec 9.  Nutrition diagnosis:  Unintended weight loss improving.  Intervention: Educated patient to increase oral nutrition supplements. Provided samples for patient. Reviewed high calorie, high protein snacks. Questions answered.  Teach back method used.    Monitoring, evaluation, Goals:  Patient will increase oral intake to promote weight gain.  Next Visit will be scheduled as needed.  **Disclaimer: This note was dictated with voice recognition software. Similar sounding words can inadvertently be transcribed and this note may contain transcription errors which may not have been corrected upon publication of note.**

## 2014-04-28 ENCOUNTER — Other Ambulatory Visit: Payer: Self-pay | Admitting: Hematology and Oncology

## 2014-04-28 DIAGNOSIS — G4700 Insomnia, unspecified: Secondary | ICD-10-CM

## 2014-04-28 MED ORDER — TEMAZEPAM 15 MG PO CAPS: ORAL_CAPSULE | ORAL | Status: DC

## 2014-05-05 ENCOUNTER — Ambulatory Visit (HOSPITAL_BASED_OUTPATIENT_CLINIC_OR_DEPARTMENT_OTHER): Payer: Medicaid Other | Admitting: Hematology and Oncology

## 2014-05-05 ENCOUNTER — Encounter: Payer: Self-pay | Admitting: Hematology and Oncology

## 2014-05-05 VITALS — BP 108/65 | HR 85 | Temp 98.9°F | Resp 18 | Ht 66.0 in | Wt 113.3 lb

## 2014-05-05 DIAGNOSIS — I890 Lymphedema, not elsewhere classified: Secondary | ICD-10-CM

## 2014-05-05 DIAGNOSIS — E43 Unspecified severe protein-calorie malnutrition: Secondary | ICD-10-CM

## 2014-05-05 DIAGNOSIS — C01 Malignant neoplasm of base of tongue: Secondary | ICD-10-CM

## 2014-05-05 DIAGNOSIS — K1231 Oral mucositis (ulcerative) due to antineoplastic therapy: Secondary | ICD-10-CM

## 2014-05-05 DIAGNOSIS — Z720 Tobacco use: Secondary | ICD-10-CM

## 2014-05-05 HISTORY — DX: Lymphedema, not elsewhere classified: I89.0

## 2014-05-05 NOTE — Assessment & Plan Note (Signed)
He is not able to gain weight and he attributed that to inability to chew food. He has appointment to see the dentist for denture placement

## 2014-05-05 NOTE — Assessment & Plan Note (Signed)
I spent some time counseling the patient the importance of tobacco cessation. he is currently attempting to quit on his own 

## 2014-05-05 NOTE — Assessment & Plan Note (Signed)
He is doing well with no clinical signs of disease. His PET scan is scheduled for March 2016. I would discharge him from the clinic

## 2014-05-05 NOTE — Assessment & Plan Note (Signed)
I will refer him to PT for massage just and exercise to reduce chronic lymphedema around his neck.

## 2014-05-05 NOTE — Assessment & Plan Note (Signed)
This has resolved. He is almost completely wean off his pain prescription

## 2014-05-05 NOTE — Progress Notes (Signed)
[place] OFFICE PROGRESS NOTE  Patient Care Team: [name], MD as PCP - General (Hematology and Oncology) Brooks Sailors, RN as Oncology Nurse Navigator  SUMMARY OF ONCOLOGIC HISTORY: Oncology History   Cancer of base of tongue, HPV negative   Primary site: Pharynx - Oropharynx   Staging method: AJCC 7th Edition   Clinical: Stage IVB (T2, N3, M0) signed by [name], MD on 10/15/2013 10:25 AM   Summary: Stage IVB (T2, N3, M0)       Cancer of base of tongue   09/17/2013 Imaging CT scan of the neck showed large bilateral neck lymphadenopathy with associated tongue mass.   09/23/2013 Procedure Fine needle aspirate of the left neck mass came back positive for squamous cell carcinoma.   10/04/2013 Surgery Laryngoscopy showed a firm mass within the left tongue base extending past the midline and encompassing much of the tongue base.  There is extension of the mass into the vallecula and on to the lingual surface of the epiglottis.   10/04/2013 Pathology Results Vallecula and tongue biopsy confirmed squamous cell carcinoma.   10/15/2013 Imaging PET/CT scan showed no evidence of distant metastatic disease apart from just regional lymph node involvement.   10/25/2013 - 11/01/2013 Hospital Admission He was admitted to the hospital due to malignant hypercalcemia and treatment was initiated.   10/27/2013 - 12/13/2013 Chemotherapy He was given 3 cycles of induction chemo with cisplatin, taxotere, 5 FU   11/03/2013 - 11/07/2013 Hospital Admission He was hospitalized for dehydration   12/31/2013 Procedure PICC line was removed   01/10/2014 Imaging PET Ct scan showed near complete response to treatment   01/26/2014 - 03/18/2014 Radiation Therapy He was treated with radiation    INTERVAL HISTORY: Please see below for problem oriented charting. He feels well. His pain is almost completely gone. He denies dysphagia. He have altered taste sensation and persistent dry mouth. He have lost a little bit  of weight which he attributed to difficulties chewing food.  REVIEW OF SYSTEMS:   Constitutional: Denies fevers, chills  Eyes: Denies blurriness of vision Ears, nose, mouth, throat, and face: Denies mucositis or sore throat Respiratory: Denies cough, dyspnea or wheezes Cardiovascular: Denies palpitation, chest discomfort or lower extremity swelling Gastrointestinal:  Denies nausea, heartburn or change in bowel habits Skin: Denies abnormal skin rashes Lymphatics: Denies new lymphadenopathy or easy bruising Neurological:Denies numbness, tingling or new weaknesses Behavioral/Psych: Mood is stable, no new changes  All other systems were reviewed with the patient and are negative.  I have reviewed the past medical history, past surgical history, social history and family history with the patient and they are unchanged from previous note.  ALLERGIES:  has No Known Allergies.  MEDICATIONS:  Current Outpatient Prescriptions  Medication Sig Dispense Refill  . acetaminophen (TYLENOL) 325 MG tablet Take 650 mg by mouth every 6 (six) hours as needed for mild pain or moderate pain.    Marland Kitchen albuterol (PROVENTIL) (2.5 MG/3ML) 0.083% nebulizer solution Take 3 mLs (2.5 mg total) by nebulization every 6 (six) hours as needed for wheezing or shortness of breath. 75 mL 12  . antiseptic oral rinse (BIOTENE) LIQD 15 mLs by Mouth Rinse route 2 (two) times daily. 1 Bottle 2  . fentaNYL (DURAGESIC - DOSED MCG/HR) 12 MCG/HR Place 1 patch (12.5 mcg total) onto the skin every 3 (three) days. 10 patch 0  . pantoprazole (PROTONIX) 40 MG tablet Take 1 tablet (40 mg total) by mouth daily. 90 tablet 2  .  prochlorperazine (COMPAZINE) 10 MG tablet Take 1 tablet (10 mg total) by mouth every 6 (six) hours as needed for nausea or vomiting. 60 tablet 3  . senna-docusate (SENOKOT-S) 8.6-50 MG per tablet Take 2 tablets by mouth 2 (two) times daily. 120 tablet 0  . temazepam (RESTORIL) 15 MG capsule TAKE 1 CAPSULE BY MOUTH AT  BEDTIME AS NEEDED FOR SLEEP 30 capsule 0  . temazepam (RESTORIL) 15 MG capsule TAKE 1 CAPSULE BY MOUTH AT BEDTIME AS NEEDED FOR SLEEP 30 capsule 0   No current facility-administered medications for this visit.    PHYSICAL EXAMINATION: ECOG PERFORMANCE STATUS: 0 - Asymptomatic  Filed Vitals:   05/05/14 1515  BP: 108/65  Pulse: 85  Temp: 98.9 F (37.2 C)  Resp: 18   Filed Weights   05/05/14 1515  Weight: 113 lb 4.8 oz (51.393 kg)    GENERAL:alert, no distress and comfortable. He looks thin SKIN: skin color, texture, turgor are normal, no rashes or significant lesions EYES: normal, Conjunctiva are pink and non-injected, sclera clear OROPHARYNX:no exudate, no erythema and lips, buccal mucosa, and tongue normal  NECK: Noted lymphedema around his neck. LYMPH:  no palpable lymphadenopathy in the cervical, axillary or inguinal LUNGS: clear to auscultation and percussion with normal breathing effort HEART: regular rate & rhythm and no murmurs and no lower extremity edema ABDOMEN:abdomen soft, non-tender and normal bowel sounds Musculoskeletal:no cyanosis of digits and no clubbing  NEURO: alert & oriented x 3 with fluent speech, no focal motor/sensory deficits  LABORATORY DATA:  I have reviewed the data as listed    Component Value Date/Time   NA 138 03/17/2014 1616   NA 133* 11/06/2013 0445   K 4.3 03/17/2014 1616   K 3.5* 11/06/2013 0445   CL 98 11/06/2013 0445   CO2 27 03/17/2014 1616   CO2 21 11/06/2013 0445   GLUCOSE 92 03/17/2014 1616   GLUCOSE 99 11/06/2013 0445   BUN 8.2 03/17/2014 1616   BUN 11 11/06/2013 0445   CREATININE 0.9 03/17/2014 1616   CREATININE 0.63 11/06/2013 0445   CALCIUM 9.3 03/17/2014 1616   CALCIUM 7.0* 11/06/2013 0445   PROT 6.4 03/17/2014 1616   PROT 5.6* 11/03/2013 2220   ALBUMIN 3.9 03/17/2014 1616   ALBUMIN 2.5* 11/03/2013 2220   AST 12 03/17/2014 1616   AST 7 11/03/2013 2220   ALT 7 03/17/2014 1616   ALT 9 11/03/2013 2220   ALKPHOS 54  03/17/2014 1616   ALKPHOS 53 11/03/2013 2220   BILITOT 0.26 03/17/2014 1616   BILITOT 0.6 11/03/2013 2220   GFRNONAA >90 11/06/2013 0445   GFRAA >90 11/06/2013 0445    No results found for: SPEP, UPEP  Lab Results  Component Value Date   WBC 5.0 03/17/2014   NEUTROABS 3.5 03/17/2014   HGB 13.3 03/17/2014   HCT 40.8 03/17/2014   MCV 95.3 03/17/2014   PLT 289 03/17/2014      Chemistry      Component Value Date/Time   NA 138 03/17/2014 1616   NA 133* 11/06/2013 0445   K 4.3 03/17/2014 1616   K 3.5* 11/06/2013 0445   CL 98 11/06/2013 0445   CO2 27 03/17/2014 1616   CO2 21 11/06/2013 0445   BUN 8.2 03/17/2014 1616   BUN 11 11/06/2013 0445   CREATININE 0.9 03/17/2014 1616   CREATININE 0.63 11/06/2013 0445      Component Value Date/Time   CALCIUM 9.3 03/17/2014 1616   CALCIUM 7.0* 11/06/2013 0445   ALKPHOS  54 03/17/2014 1616   ALKPHOS 53 11/03/2013 2220   AST 12 03/17/2014 1616   AST 7 11/03/2013 2220   ALT 7 03/17/2014 1616   ALT 9 11/03/2013 2220   BILITOT 0.26 03/17/2014 1616   BILITOT 0.6 11/03/2013 2220      ASSESSMENT & PLAN:  Cancer of base of tongue He is doing well with no clinical signs of disease. His PET scan is scheduled for March 2016. I would discharge him from the clinic   Mucositis due to antineoplastic therapy This has resolved. He is almost completely wean off his pain prescription  Protein-calorie malnutrition, severe He is not able to gain weight and he attributed that to inability to chew food. He has appointment to see the dentist for denture placement  Tobacco abuse I spent some time counseling the patient the importance of tobacco cessation. he is currently attempting to quit on his own    Lymphedema I will refer him to PT for massage just and exercise to reduce chronic lymphedema around his neck.   Orders Placed This Encounter  Procedures  . Ambulatory referral to Physical Therapy    Referral Priority:  Routine    Referral  Type:  Physical Medicine    Referral Reason:  Specialty Services Required    Requested Specialty:  Physical Therapy    Number of Visits Requested:  1   All questions were answered. The patient knows to call the clinic with any problems, questions or concerns. No barriers to learning was detected. I spent 25 minutes counseling the patient face to face. The total time spent in the appointment was 30 minutes and more than 50% was on counseling and review of test results     [facility], [place], MD 05/05/2014 3:46 PM

## 2014-05-25 ENCOUNTER — Other Ambulatory Visit: Payer: Self-pay | Admitting: Hematology and Oncology

## 2014-05-26 ENCOUNTER — Other Ambulatory Visit: Payer: Self-pay | Admitting: Hematology and Oncology

## 2014-05-26 NOTE — Telephone Encounter (Signed)
Informed wife we faxed refill on Restoril to [facility].   She verbalized understanding.

## 2014-06-20 ENCOUNTER — Encounter (HOSPITAL_COMMUNITY): Payer: Self-pay | Admitting: Dentistry

## 2014-06-21 ENCOUNTER — Encounter (HOSPITAL_COMMUNITY): Payer: Self-pay | Admitting: Dentistry

## 2014-06-21 ENCOUNTER — Ambulatory Visit (HOSPITAL_COMMUNITY): Payer: Medicaid - Dental | Admitting: Dentistry

## 2014-06-21 VITALS — BP 113/77 | HR 60 | Temp 98.2°F

## 2014-06-21 DIAGNOSIS — K0820 Unspecified atrophy of edentulous alveolar ridge: Secondary | ICD-10-CM

## 2014-06-21 DIAGNOSIS — Z463 Encounter for fitting and adjustment of dental prosthetic device: Secondary | ICD-10-CM

## 2014-06-21 DIAGNOSIS — C01 Malignant neoplasm of base of tongue: Secondary | ICD-10-CM

## 2014-06-21 DIAGNOSIS — Z923 Personal history of irradiation: Secondary | ICD-10-CM

## 2014-06-21 DIAGNOSIS — Z9221 Personal history of antineoplastic chemotherapy: Secondary | ICD-10-CM

## 2014-06-21 DIAGNOSIS — K08109 Complete loss of teeth, unspecified cause, unspecified class: Secondary | ICD-10-CM

## 2014-06-21 DIAGNOSIS — K117 Disturbances of salivary secretion: Secondary | ICD-10-CM

## 2014-06-21 DIAGNOSIS — R682 Dry mouth, unspecified: Secondary | ICD-10-CM

## 2014-06-21 NOTE — Patient Instructions (Signed)
Return to clinic as scheduled for continued upper lower complete denture fabrication. Dr. Kulinski 

## 2014-06-21 NOTE — Progress Notes (Signed)
06/21/2014  Patient Name:   Angel [name] Date of Birth:   02-25-1951 Medical Record Number: [id number]  BP 113/77 mmHg  Pulse 60  Temp(Src) 98.2 F (36.8 C) (Oral)  [name] presents for start of upper and lower denture fabrication.  Exam: Patient is edentulous. Patient has xerostomia. Discussed procedures involved in upper and lower denture fabrication and prognosis for successful ability to wear dentures. Price for dentures confirmed.  Patient agrees to proceed with upper and lower denture fabrication. Procedure:  Upper and lower denture primary impressions in alginate. Lab pour. To [facility] for upper and lower denture custom tray fabrication. RTC for upper and lower denture final impressions.  Lenn Cal, DDS

## 2014-06-24 ENCOUNTER — Other Ambulatory Visit: Payer: Self-pay | Admitting: Hematology and Oncology

## 2014-06-24 ENCOUNTER — Telehealth: Payer: Self-pay | Admitting: Hematology and Oncology

## 2014-06-24 MED ORDER — TEMAZEPAM 15 MG PO CAPS: 15.0000 mg | ORAL_CAPSULE | Freq: Every day | ORAL | Status: DC

## 2014-06-24 NOTE — Telephone Encounter (Signed)
At wife's request, called patient regarding rejection of the refill for Restoril.  Explained that we cannot determine today why it was rejected.  He can call back Monday when Dr. Elson Areas is in the office so that information can be relayed.

## 2014-06-27 ENCOUNTER — Telehealth: Payer: Self-pay | Admitting: *Deleted

## 2014-06-27 ENCOUNTER — Other Ambulatory Visit: Payer: Self-pay | Admitting: Hematology and Oncology

## 2014-06-27 DIAGNOSIS — G4700 Insomnia, unspecified: Secondary | ICD-10-CM

## 2014-06-27 MED ORDER — TEMAZEPAM 15 MG PO CAPS: 15.0000 mg | ORAL_CAPSULE | Freq: Every day | ORAL | Status: DC

## 2014-06-27 NOTE — Telephone Encounter (Signed)
Spoke with patient wife, informed her that written Rx for Restoril is available for pick-up at [facility].  She verbalized understanding.  Gayleen Orem, RN, BSN, [place] at [place] [phone]

## 2014-06-27 NOTE — Telephone Encounter (Signed)
Patient wife called stating that Restoril refill has been denied by [facility].  I indicated I would follow-up with Pharmacy and Dr. Alvy Bimler on Monday.  Gayleen Orem, RN, BSN, [place] at [place] [phone]

## 2014-06-29 ENCOUNTER — Ambulatory Visit (HOSPITAL_COMMUNITY): Payer: Medicaid - Dental | Admitting: Dentistry

## 2014-06-29 ENCOUNTER — Encounter (HOSPITAL_COMMUNITY): Payer: Self-pay | Admitting: Dentistry

## 2014-06-29 VITALS — BP 122/74 | HR 70 | Temp 98.5°F

## 2014-06-29 DIAGNOSIS — K0820 Unspecified atrophy of edentulous alveolar ridge: Secondary | ICD-10-CM

## 2014-06-29 DIAGNOSIS — Z9221 Personal history of antineoplastic chemotherapy: Secondary | ICD-10-CM

## 2014-06-29 DIAGNOSIS — Z463 Encounter for fitting and adjustment of dental prosthetic device: Secondary | ICD-10-CM

## 2014-06-29 DIAGNOSIS — C01 Malignant neoplasm of base of tongue: Secondary | ICD-10-CM

## 2014-06-29 DIAGNOSIS — Z923 Personal history of irradiation: Secondary | ICD-10-CM

## 2014-06-29 DIAGNOSIS — K08109 Complete loss of teeth, unspecified cause, unspecified class: Secondary | ICD-10-CM

## 2014-06-29 NOTE — Patient Instructions (Signed)
Return to clinic as scheduled for continued upper lower complete denture fabrication. Dr. Youssouf Shipley 

## 2014-06-29 NOTE — Progress Notes (Signed)
06/29/2014  Patient Name:   Angel [name] Date of Birth:   1950-11-29 Medical Record Number: [id number]  BP 122/74 mmHg  Pulse 70  Temp(Src) 98.5 F (36.9 C) (Oral)  [name] presents for continued upper and lower complete denture fabrication.  Procedure:  Upper and lower border molding and final impressions in Aquasil. Patient tolerated procedure well. To [facility] for custom baseplates with rims. Return to clinic for upper and lower complete denture jaw relations.  Lenn Cal, DDS

## 2014-07-05 ENCOUNTER — Encounter: Payer: Self-pay | Admitting: Radiation Oncology

## 2014-07-05 NOTE — Progress Notes (Signed)
A peer-to-peer is setup for tomorrow, 3/9 (4pm) for Dr. Melida Gimenez, at [facility], to speak to our Dr. Isidore Moos) in regards to denial of PET scan.

## 2014-07-07 ENCOUNTER — Encounter: Payer: Self-pay | Admitting: Radiation Oncology

## 2014-07-07 ENCOUNTER — Encounter (HOSPITAL_COMMUNITY): Payer: Self-pay | Admitting: Dentistry

## 2014-07-07 ENCOUNTER — Ambulatory Visit (HOSPITAL_COMMUNITY): Payer: Medicaid - Dental | Admitting: Dentistry

## 2014-07-07 VITALS — BP 118/66 | HR 66 | Temp 98.9°F

## 2014-07-07 DIAGNOSIS — Z9221 Personal history of antineoplastic chemotherapy: Secondary | ICD-10-CM

## 2014-07-07 DIAGNOSIS — Z463 Encounter for fitting and adjustment of dental prosthetic device: Secondary | ICD-10-CM

## 2014-07-07 DIAGNOSIS — C01 Malignant neoplasm of base of tongue: Secondary | ICD-10-CM

## 2014-07-07 DIAGNOSIS — Z923 Personal history of irradiation: Secondary | ICD-10-CM

## 2014-07-07 DIAGNOSIS — K08109 Complete loss of teeth, unspecified cause, unspecified class: Secondary | ICD-10-CM

## 2014-07-07 NOTE — Progress Notes (Signed)
Authorization given for PET scan scheduled on 3/17-[id number].

## 2014-07-07 NOTE — Progress Notes (Signed)
07/07/2014  Patient Name:   Angel [name] Date of Birth:   02-21-1951 Medical Record Number: [id number]  BP 118/66 mmHg  Pulse 66  Temp(Src) 98.9 F (37.2 C) (Oral)  [name] presents for continued denture fabrication.  Procedure:  Upper and lower denture Jaw relations with aluwax bite registration. Patient agrees to tooth selection of 22E, H, and 10 degree posteriors to match with Portrait A2 shade. Patient tolerated procedure well. RTC for denture wax try in.   Lenn Cal, DDS

## 2014-07-07 NOTE — Patient Instructions (Signed)
Return to clinic as scheduled for continued upper and lower complete denture fabrication. Dr. Vickye Astorino 

## 2014-07-08 ENCOUNTER — Encounter: Payer: Self-pay | Admitting: *Deleted

## 2014-07-14 ENCOUNTER — Ambulatory Visit (HOSPITAL_COMMUNITY)
Admission: RE | Admit: 2014-07-14 | Discharge: 2014-07-14 | Disposition: A | Discharge status: Home / Self Care | Payer: Medicaid Other | Source: Ambulatory Visit | Attending: Radiation Oncology | Admitting: Radiation Oncology

## 2014-07-14 ENCOUNTER — Ambulatory Visit
Admission: RE | Admit: 2014-07-14 | Discharge: 2014-07-14 | Disposition: A | Discharge status: Home / Self Care | Payer: Medicaid Other | Source: Ambulatory Visit | Attending: Radiation Oncology | Admitting: Radiation Oncology

## 2014-07-14 DIAGNOSIS — R634 Abnormal weight loss: Secondary | ICD-10-CM

## 2014-07-14 DIAGNOSIS — C01 Malignant neoplasm of base of tongue: Secondary | ICD-10-CM

## 2014-07-14 DIAGNOSIS — C760 Malignant neoplasm of head, face and neck: Secondary | ICD-10-CM | POA: Diagnosis not present

## 2014-07-14 LAB — GLUCOSE, CAPILLARY: Glucose-Capillary: 95 mg/dL (ref 70–99)

## 2014-07-14 LAB — TSH CHCC: TSH: 1.488 m[IU]/L (ref 0.320–4.118)

## 2014-07-14 MED ORDER — FLUDEOXYGLUCOSE F - 18 (FDG) INJECTION: 5.5000 | Freq: Once | INTRAVENOUS | Status: AC | PRN

## 2014-07-15 ENCOUNTER — Ambulatory Visit
Admission: RE | Admit: 2014-07-15 | Discharge: 2014-07-15 | Disposition: A | Discharge status: Home / Self Care | Payer: Medicaid Other | Source: Ambulatory Visit | Attending: Radiation Oncology | Admitting: Radiation Oncology

## 2014-07-15 ENCOUNTER — Encounter: Payer: Self-pay | Admitting: Radiation Oncology

## 2014-07-15 ENCOUNTER — Telehealth: Payer: Self-pay | Admitting: *Deleted

## 2014-07-15 ENCOUNTER — Encounter: Payer: Self-pay | Admitting: *Deleted

## 2014-07-15 ENCOUNTER — Other Ambulatory Visit: Payer: Self-pay | Admitting: Hematology and Oncology

## 2014-07-15 VITALS — BP 117/68 | HR 61 | Temp 97.7°F | Resp 20 | Ht 65.0 in | Wt 114.0 lb

## 2014-07-15 DIAGNOSIS — C01 Malignant neoplasm of base of tongue: Secondary | ICD-10-CM

## 2014-07-15 NOTE — Progress Notes (Signed)
Follow up s/p rad txs base tongue  01/26/14-03/18/14, her for Pet scan results from 07/14/14, TSH results=1.488, no difficulty swallowing, no c/o nausea, or pain, appetite fair waiting on dentures, appt with Dr. Enrique Sack  07/18/14, using bioten rinses daily , still has dryness in mouth, porr stamina and strength 11:34 AM

## 2014-07-15 NOTE — Telephone Encounter (Signed)
Called patient to inform of Pet Scan and Fu, spoke with patient and he is aware of these appts.

## 2014-07-15 NOTE — Progress Notes (Addendum)
Radiation Oncology         [phone]) [phone] ________________________________  Name: MICAEL BARB MRN: [id number]  Date: 04/04/2014  DOB: [birth year]  Follow-Up Visit Note  CC: [name], MD  [name], MD  Diagnosis and Prior Radiotherapy:        ICD-9-CM ICD-10-CM   1. Cancer of base of tongue 141.0 C01    T2N3M0 IVB squamous cell carcinoma, base of tongue  Indication for treatment:  Curative, following induction chemotherapy       Radiation treatment dates:   01/26/2014-03/18/2014  Site/dose:   Base of tongue and bilateral neck / 70 Gy in 35 fractions to gross disease, 63 Gy in 35 fractions to high risk nodal echelons, and 56 Gy in 35 fractions to intermediate risk nodal echelons  Narrative:  The patient returns today for routine follow-up.  He complaints of swelling in anterior neck, tightness in neck, and decreased ROM in left shoulder.  No dysphagia, nausea, or pain.  Still smoking.  Weight up 2 lbs.  Waiting on dentures.  Poor stamina/strength   ALLERGIES:  has No Known Allergies.  Meds:  Current outpatient prescriptions:  .  acetaminophen (TYLENOL) 325 MG tablet, Take 650 mg by mouth every 6 (six) hours as needed for mild pain or moderate pain., Disp: , Rfl:  .  antiseptic oral rinse (BIOTENE) LIQD, 15 mLs by Mouth Rinse route 2 (two) times daily., Disp: 1 Bottle, Rfl: 2 .  temazepam (RESTORIL) 15 MG capsule, Take 1 capsule (15 mg total) by mouth at bedtime., Disp: 30 capsule, Rfl: 0 .  albuterol (PROVENTIL) (2.5 MG/3ML) 0.083% nebulizer solution, Take 3 mLs (2.5 mg total) by nebulization every 6 (six) hours as needed for wheezing or shortness of breath. (Patient not taking: Reported on 07/15/2014), Disp: 75 mL, Rfl: 12 .  fentaNYL (DURAGESIC - DOSED MCG/HR) 12 MCG/HR, Place 1 patch (12.5 mcg total) onto the skin every 3 (three) days. (Patient not taking: Reported on 06/21/2014), Disp: 10 patch, Rfl: 0 .  pantoprazole (PROTONIX) 40 MG tablet, Take 1 tablet (40 mg total) by  mouth daily. (Patient not taking: Reported on 07/15/2014), Disp: 90 tablet, Rfl: 2 .  prochlorperazine (COMPAZINE) 10 MG tablet, Take 1 tablet (10 mg total) by mouth every 6 (six) hours as needed for nausea or vomiting. (Patient not taking: Reported on 06/21/2014), Disp: 60 tablet, Rfl: 3 .  senna-docusate (SENOKOT-S) 8.6-50 MG per tablet, Take 2 tablets by mouth 2 (two) times daily. (Patient not taking: Reported on 06/21/2014), Disp: 120 tablet, Rfl: 0   Physical Findings:  Filed Vitals:   07/15/14 1130  BP: 117/68  Pulse: 61  Temp: 97.7 F (36.5 C)  Resp: 20   Filed Weights   07/15/14 1130  Weight: 114 lb (51.71 kg)    Oropharyngeal mucosa is intact with no thrush or lesions. No palpable cervical or supraclavicular lymphadenopathy. Skin intact and smooth over neck.  Lungs with scattered wheezes, heart RRR.   Lab Findings:  CBC    Component Value Date/Time   WBC 5.0 03/17/2014 1615   WBC 3.8* 11/06/2013 0445   RBC 4.28 03/17/2014 1615   RBC 2.91* 11/06/2013 0445   HGB 13.3 03/17/2014 1615   HGB 9.4* 11/06/2013 0445   HCT 40.8 03/17/2014 1615   HCT 26.8* 11/06/2013 0445   PLT 289 03/17/2014 1615   PLT 156 11/06/2013 0445   MCV 95.3 03/17/2014 1615   MCV 92.1 11/06/2013 0445   MCH 31.1 03/17/2014 1615   MCH 32.3  11/06/2013 0445   MCHC 32.6 03/17/2014 1615   MCHC 35.1 11/06/2013 0445   RDW 15.1* 03/17/2014 1615   RDW 14.0 11/06/2013 0445   LYMPHSABS 0.4* 03/17/2014 1615   LYMPHSABS 1.1 11/06/2013 0445   MONOABS 0.5 03/17/2014 1615   MONOABS 0.7 11/06/2013 0445   EOSABS 0.6* 03/17/2014 1615   EOSABS 0.0 11/06/2013 0445   BASOSABS 0.1 03/17/2014 1615   BASOSABS 0.0 11/06/2013 0445   CMP     Component Value Date/Time   NA 138 03/17/2014 1616   NA 133* 11/06/2013 0445   K 4.3 03/17/2014 1616   K 3.5* 11/06/2013 0445   CL 98 11/06/2013 0445   CO2 27 03/17/2014 1616   CO2 21 11/06/2013 0445   GLUCOSE 92 03/17/2014 1616   GLUCOSE 99 11/06/2013 0445   BUN 8.2  03/17/2014 1616   BUN 11 11/06/2013 0445   CREATININE 0.9 03/17/2014 1616   CREATININE 0.63 11/06/2013 0445   CALCIUM 9.3 03/17/2014 1616   CALCIUM 7.0* 11/06/2013 0445   PROT 6.4 03/17/2014 1616   PROT 5.6* 11/03/2013 2220   ALBUMIN 3.9 03/17/2014 1616   ALBUMIN 2.5* 11/03/2013 2220   AST 12 03/17/2014 1616   AST 7 11/03/2013 2220   ALT 7 03/17/2014 1616   ALT 9 11/03/2013 2220   ALKPHOS 54 03/17/2014 1616   ALKPHOS 53 11/03/2013 2220   BILITOT 0.26 03/17/2014 1616   BILITOT 0.6 11/03/2013 2220   GFRNONAA >90 11/06/2013 0445   GFRAA >90 11/06/2013 0445    Lab Results  Component Value Date   TSH 1.488 07/14/2014       Radiographic Findings: Nm Pet Image Restag (ps) Skull Base To Thigh  07/14/2014   CLINICAL DATA:  Subsequent treatment strategy for head and neck cancer.  EXAM: NUCLEAR MEDICINE PET SKULL BASE TO THIGH  TECHNIQUE: 5.5 mCi F-18 FDG was injected intravenously. Full-ring PET imaging was performed from the skull base to thigh after the radiotracer. CT data was obtained and used for attenuation correction and anatomic localization.  FASTING BLOOD GLUCOSE:  Value: 95 mg/dl  COMPARISON:  01/10/2014  FINDINGS: NECK  Resolution of previous hypermetabolic right axillary adenopathy. Significant interval decrease in size and degree of FDG uptake associated with hypermetabolic level 2 lymph nodes. Persistent activity within the left level-II lymph node chain is identified within SUV max equal to 3.5, previously 14.2. No new areas of increased uptake identified within the neck.  CHEST  No hypermetabolic mediastinal or hilar nodes. Calcified atherosclerotic disease involves the thoracic aorta as well as the LAD Coronary artery. No suspicious pulmonary nodules on the CT scan. No pleural effusion identified. Within the left upper lobe there is an irregular nodular density which measures 5 x 8 mm and has an SUV max equal to 1.7. This is a new finding when compared with previous exam.   ABDOMEN/PELVIS  No abnormal hypermetabolic activity within the liver, pancreas, adrenal glands, or spleen. No hypermetabolic lymph nodes in the abdomen or pelvis.  SKELETON  No focal hypermetabolic activity to suggest skeletal metastasis.  IMPRESSION: 1. Improved but persistent abnormal FDG uptake associated with left level 2 lymph nodes compatible with response to therapy but with residual metabolically active tumor. 2. Resolution of previous hypermetabolic right level 2 lymph nodes. 3. There is a new pulmonary nodule in the left upper lobe. There is low level, malignant range FDG uptake associated with this nodule. This may be inflammatory or infectious. Metastatic disease is not excluded. Suggest short-term followup in  3 months to confirm persistence.   Electronically Signed   By: Kerby Moors M.D.   On: 07/14/2014 14:14   Impression/Plan:    1) Head and Neck Cancer Status: discussed impressive response to ChRT on PET images with persistent PET activity that is nonspecific. 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  Watch lung nodule.  2) Nutritional Status:refer back to nutrition for guidance - weight: stabilizing  - PEG tube: N/A  3) Risk Factors: The patient has been educated about risk factors including alcohol and tobacco abuse; they understand that avoidance of alcohol and tobacco is important to prevent recurrences as well as other cancers. The patient continues to use tobacco. The patient was counseled to stop using tobacco: materials given to education him and provide counseling options.   4) Swallowing: denies active problems  5) Dental: waiting on dentures. Edentulous.  6) Thyroid function: normal TSH   7) Social:  Invited to survivorship celebration in April 2016  8) Follow-up plan: due to nonspecific findings on PET, will order repeat PET imaging in 97mo, prior to f/u. Discussed impressive response to ChRT on PET images with persistent PET activity  that is nonspecific. 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 Cannot rule out residual cancer.  Watch lung nodule.  Discuss case at tumor board.  9) OTHER: PT referral for decreased strength, edema in neck, ROM issues in left shoulder, tightness in neck    The patient was encouraged to call with any issues or questions before then. _____________________________________   Eppie Gibson, MD

## 2014-07-18 ENCOUNTER — Encounter (HOSPITAL_COMMUNITY): Payer: Self-pay | Admitting: Dentistry

## 2014-07-18 ENCOUNTER — Ambulatory Visit (HOSPITAL_COMMUNITY): Payer: Medicaid Other | Admitting: Dentistry

## 2014-07-18 VITALS — BP 114/62 | HR 53 | Temp 98.4°F

## 2014-07-18 DIAGNOSIS — C01 Malignant neoplasm of base of tongue: Secondary | ICD-10-CM

## 2014-07-18 DIAGNOSIS — Z923 Personal history of irradiation: Secondary | ICD-10-CM

## 2014-07-18 DIAGNOSIS — K0820 Unspecified atrophy of edentulous alveolar ridge: Secondary | ICD-10-CM

## 2014-07-18 DIAGNOSIS — Z463 Encounter for fitting and adjustment of dental prosthetic device: Secondary | ICD-10-CM

## 2014-07-18 DIAGNOSIS — Z9221 Personal history of antineoplastic chemotherapy: Secondary | ICD-10-CM

## 2014-07-18 DIAGNOSIS — K08109 Complete loss of teeth, unspecified cause, unspecified class: Secondary | ICD-10-CM

## 2014-07-18 NOTE — Patient Instructions (Signed)
Return to clinic as scheduled for insertion of upper lower complete dentures. Dr. Abbott Jasinski 

## 2014-07-18 NOTE — Progress Notes (Signed)
07/18/2014  Patient Name:   Angel [name] Date of Birth:   [birth year] Medical Record Number: [id number]   BP 114/62 mmHg  Pulse 53  Temp(Src) 98.4 F (36.9 C) (Oral)  [name] presents for continued upper and lower denture fabrication.  Procedure:  Upper and lower denture wax tryin. Patient accepts esthetics, phonetics, fit and function. Patient agrees to process "as is" in [place] [address]. Patient to RTC for  upper and lower denture insertion.  Lenn Cal, DDS

## 2014-07-19 ENCOUNTER — Encounter: Payer: Self-pay | Admitting: Radiation Oncology

## 2014-07-19 ENCOUNTER — Ambulatory Visit: Payer: Medicaid Other | Attending: Radiation Oncology | Admitting: Physical Therapy

## 2014-07-19 DIAGNOSIS — I890 Lymphedema, not elsewhere classified: Secondary | ICD-10-CM | POA: Insufficient documentation

## 2014-07-19 DIAGNOSIS — R6889 Other general symptoms and signs: Secondary | ICD-10-CM | POA: Diagnosis not present

## 2014-07-19 DIAGNOSIS — M436 Torticollis: Secondary | ICD-10-CM | POA: Diagnosis not present

## 2014-07-19 DIAGNOSIS — M25612 Stiffness of left shoulder, not elsewhere classified: Secondary | ICD-10-CM | POA: Insufficient documentation

## 2014-07-19 NOTE — Progress Notes (Signed)
To provide support, care continuity and to assess for needs, met with patient during 74-monthf/u appt with Dr. SIsidore Moos  He was accompanied by his wife.  To support his expressed understanding of the importance of smoking cessation, I provided him, again, Quitline [place] and [place] information.  They understand I can be contacted with needs/concerns.  RGayleen Orem RN, BSN, [phone]

## 2014-07-19 NOTE — Therapy (Signed)
[place], [place], [address] Phone: [phone]   Fax:  [phone]  Physical Therapy Evaluation  Patient Details  Name: Angel [name] MRN: [id number] Date of Birth: March 23, 1951 Referring Provider:  Eppie Gibson, MD  Encounter Date: 07/19/2014      PT End of Session - 07/19/14 1608    Visit Number 1   Number of Visits 4   Date for PT Re-Evaluation 09/17/14   PT Start Time 8032   PT Stop Time 1348   PT Time Calculation (min) 43 min   Activity Tolerance Patient tolerated treatment well   Behavior During Therapy [facility] for tasks assessed/performed      Past Medical History  Diagnosis Date  . Constipation due to pain medication   . Malignant neoplasm of base of tongue     left neck lymph and Left BOT cancer  . Current smoker   . 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 Insomnia 11/12/2013  . Hypomagnesemia 11/12/2013  . Thrush of mouth and esophagus 02/21/2014  . S/P radiation therapy 01/26/2014-03/18/2014    base of tongue, bilateral neck/ 70 Gy/35 fx,   . Lymphedema 05/05/2014    Past Surgical History  Procedure Laterality Date  . Testicle surgery      as a infant  . Appendectomy    . Mandible surgery      from [place]  . Back surgery      Lumbar  . Tonsillectomy    . Panendoscopy N/A 10/04/2013    Procedure: Direct Laryngoscopy  WITH BIOPSY;  Surgeon: Melida Quitter, MD;  Location: [place];  Service: ENT;  Laterality: N/A;  . Esophagoscopy  10/04/2013    Procedure: ESOPHAGOSCOPY;  Surgeon: Melida Quitter, MD;  Location: [place];  Service: ENT;;  . Multiple extractions with alveoloplasty N/A 10/08/2013    Procedure: extraction of tooth #'s 2,3,4,5,6,11,12,13,14,15,18,19,20,21,22,23,24,25,26, 27,28, 29, 31 with alveoloplasty ;  Surgeon: Lenn Cal, DDS;  Location: [place];  Service: Oral Surgery;  Laterality: N/A;    There were no  vitals filed for this visit.  Visit Diagnosis:  Acquired lymphedema - Plan: PT plan of care cert/re-cert  Stiffness of neck - Plan: PT plan of care cert/re-cert  Stiffness of joint, shoulder region, left - Plan: PT plan of care cert/re-cert  Impaired function of upper extremity - Plan: PT plan of care cert/re-cert      Subjective Assessment - 07/19/14 1311    Symptoms "Dr. Isidore Moos was looking at my neck and was feeling around my neck and she was going to recommend me to come over  here.  I have a little bit of swelling. The mass is all but gone."  Neck has been like a solid brick since treatment; relaxation helps it.   Pertinent History Diagnosed with tongue carcinoma in spring 2015; completed chemoradiation November 2015.   Patient Stated Goals neck less firm and less sore--"If you can tell me what's going on up there."   Currently in Pain? Yes   Pain Score 0-No pain  up to 6/10 when it occurs   Pain Location Neck   Pain Orientation Posterior   Pain Descriptors / Indicators Sore;Other (Comment)  not debilitating--uncomfortable   Pain Frequency Other (Comment)  daily   Aggravating Factors  muscle tension   Pain Relieving Factors intentionally relaxing the muscles  [facility] PT Assessment - 07/19/14 0001    Assessment   Medical Diagnosis squamous cell carcinoma of the tongue   Precautions   Precautions Other (comment)  cancer precautions   Restrictions   Weight Bearing Restrictions No   Balance Screen   Has the patient fallen in the past 6 months No   Has the patient had a decrease in activity level because of a fear of falling?  No   Is the patient reluctant to leave their home because of a fear of falling?  No   Home Environment   Living Enviornment Private residence   Living Arrangements Spouse/significant other   Home Layout One level   Prior Function   Level of Independence Independent with basic ADLs;Independent with homemaking with ambulation;Independent  with gait   Vocation On disability   Leisure work in the yard   Observation/Other Assessments   Observations atrophied at left supraspinatus fossa; some winging of left scapula   Posture/Postural Control   Posture/Postural Control Postural limitations   Postural Limitations Forward head;Rounded Shoulders   Posture Comments atrophy/hollowing inferior to left clavicle and by patient report, at left axilla   ROM / Strength   AROM / PROM / Strength AROM;Strength;PROM   AROM   AROM Assessment Site Cervical;Shoulder   Right/Left Shoulder Right;Left   Right Shoulder Flexion 140 Degrees   Right Shoulder ABduction 165 Degrees   Left Shoulder Flexion 120 Degrees   Left Shoulder ABduction 103 Degrees   Cervical Flexion WFL  fullness in anterior neck limits slightly   Cervical Extension 50% loss  right   Cervical - Right Side Bend 25% loss   Cervical - Left Side Bend 50% loss   Cervical - Right Rotation 30% loss   Cervical - Left Rotation 30% loss  feels tight left upper trap   PROM   PROM Assessment Site Shoulder   Right/Left Shoulder Right;Left   Right Shoulder Internal Rotation 60 Degrees   Right Shoulder External Rotation 81 Degrees   Left Shoulder Flexion 145 Degrees   Left Shoulder ABduction 98 Degrees   Left Shoulder Internal Rotation --  [facility]   Left Shoulder External Rotation 40 Degrees   Strength   Overall Strength Comments Right shoulder grossly 5/5, left 3- to 4/5   Palpation   Palpation firm at anterior neck           LYMPHEDEMA/ONCOLOGY QUESTIONNAIRE - 07/19/14 1320    Type   Cancer Type squamous cell carcinoma of tongue   Treatment   Past Chemotherapy Treatment Yes   Date 03/14/15  approx.   Past Radiation Treatment Yes   Date 03/14/15  approx.   Body Site head/neck   Lymphedema Assessments   Lymphedema Assessments Head and Neck   Head and Neck   4 cm superior to sternal notch around neck 39.2 cm   6 cm superior to sternal notch around neck 40.3 cm   8  cm superior to sternal notch around neck 42.4 cm   Other 44  10 cm. proximal to sternal notch   Other --  note that patient lost 20 lbs. during treatment                                [place] - 07/19/14 1614    CC Long Term Goal  #1   Title Patient will be independent in self-manual lymph drainage for his neck swelling.  Baseline no knowledge   Time 3   Period Weeks   Status New   CC Long Term Goal  #2   Title Patient will be knowledgeable about compression garments for neck swelling.   Baseline no knowledge   Time 3   Period Weeks   Status New   CC Long Term Goal  #3   Title Pt. will be independent in home exercise program for neck and left shoulder ROM and left shoulder strengthening.   Baseline limited knowledge   Time 3   Period Weeks   Status New            Plan - 07/19/14 1608    Clinical Impression Statement Patient with fullness and firmness at anterior neck, tightness in neck, and limited ROM of left shoulder s/p chemoradiation for head & neck cancer will benefit from instruction in management of these problems.   Pt will benefit from skilled therapeutic intervention in order to improve on the following deficits Increased edema;Increased fascial restricitons;Decreased range of motion;Impaired UE functional use   Rehab Potential Good   PT Frequency 1x / week   PT Duration 3 weeks   PT Treatment/Interventions Manual lymph drainage;Patient/family education;ADLs/Self Care Home Management;Therapeutic exercise;Manual techniques   PT Next Visit Plan Perform and teach manual lymph drainage for neck; fashion foam chip pack and discuss compression garments; later teach neck and left shoulder ROM and left UE strengthening.   Consulted and Agree with Plan of Care Patient         Problem List Patient Active Problem List   Diagnosis Date Noted  . Lymphedema 05/05/2014  . Mucositis due to antineoplastic therapy 02/21/2014  .  Headache 01/25/2014  . Tobacco abuse 01/25/2014  . Preventive measure 01/01/2014  . Diarrhea 11/15/2013  . Bilateral leg edema 11/15/2013  . Insomnia 11/12/2013  . Hypomagnesemia 11/12/2013  . Generalized weakness 10/27/2013  . Fall 10/27/2013  . Thrombocytosis 10/27/2013  . Protein-calorie malnutrition, severe 10/26/2013  . Leukocytosis, unspecified 10/25/2013  . Neck pain 10/14/2013  . Anemia in neoplastic disease 10/05/2013  . Weight loss 10/05/2013  . Cancer of base of tongue 09/17/2013    SALISBURY,DONNA 07/19/2014, 4:17 PM  [place], [place], [address] Phone: [phone]   Fax:  [place], PT 07/19/2014 4:17 PM

## 2014-07-19 NOTE — Progress Notes (Signed)
3.22.16:  Rec'd [place] [employer] form from patient.  Forward to RN for processing

## 2014-07-25 ENCOUNTER — Encounter: Payer: Self-pay | Admitting: Radiation Oncology

## 2014-07-25 NOTE — Progress Notes (Signed)
3.28.16:  Rec'd completed [place] Form.  Fax to [phone] and will mail patient the original with confirmation of fax.

## 2014-07-26 ENCOUNTER — Ambulatory Visit (HOSPITAL_COMMUNITY): Payer: Medicaid Other | Admitting: Dentistry

## 2014-07-26 ENCOUNTER — Encounter (HOSPITAL_COMMUNITY): Payer: Self-pay | Admitting: Dentistry

## 2014-07-26 VITALS — BP 122/75 | HR 59 | Temp 98.5°F

## 2014-07-26 DIAGNOSIS — K0820 Unspecified atrophy of edentulous alveolar ridge: Secondary | ICD-10-CM

## 2014-07-26 DIAGNOSIS — Z923 Personal history of irradiation: Secondary | ICD-10-CM

## 2014-07-26 DIAGNOSIS — C01 Malignant neoplasm of base of tongue: Secondary | ICD-10-CM

## 2014-07-26 DIAGNOSIS — K08109 Complete loss of teeth, unspecified cause, unspecified class: Secondary | ICD-10-CM

## 2014-07-26 DIAGNOSIS — Z9221 Personal history of antineoplastic chemotherapy: Secondary | ICD-10-CM

## 2014-07-26 DIAGNOSIS — Z463 Encounter for fitting and adjustment of dental prosthetic device: Secondary | ICD-10-CM

## 2014-07-26 NOTE — Progress Notes (Signed)
07/26/2014  Patient Name:   Angel [name] Date of Birth:   1951-01-13 Medical Record Number: [id number]  BP 122/75 mmHg  Pulse 59  Temp(Src) 98.5 F (36.9 C) (Oral)   [name] presents for insertion of upper and lower complete dentures.  Procedure: Pressure indicating paste was applied to the dentures. Adjustments were made as needed. [place] Occlusion evaluated and adjustments made as needed for Centric Relation and protrusive strokes. The patient with tendency to protrude mandible in a protrusive position. Patient was provided instructions in finding maximum intercuspation position. Good esthetics, phonetics, fit, and function noted. Patient accepts results. Post op instructions provided in written and verbal formats on use and care of dentures. Gave patient denture brush and cup. Patient to keep dentures out if sore spots develop. Use salt water rinses as needed to aid healing. Return to clinic as scheduled for denture adjustment.   Call if problems arise before then.  Lenn Cal, DDS

## 2014-07-26 NOTE — Patient Instructions (Signed)
Instructions for Denture Use and Care  Congratulations, you are on the way to oral rehabilitation!  You have just received a new set of complete or partial dentures.  These prostheses will help to improve both your appearance and chewing ability.  These instructions will help you get adjusted to your dentures as well as care for them properly.  Please read these instructions carefully and completely as soon as you get home.  If you or your caregiver have any questions please notify the [facility] at [phone].  HOW YOUR DENTURES LOOK AND FEEL Soon after you begin wearing your dentures, you may feel that your dentures are too large or even loose.  As our mouth and facial muscles become accustomed to the dentures, these feelings will go away.  You also may feel that you are salivating more than you normally do.  This feeling should go away as you get used to having the dentures in your mouth.  You may bite your cheek or your tongue; this will eventually resolve itself as you wear your dentures.  Some soreness is to be expected, but you should not hurt.  If your mouth hurts, call your dentist.  A denture adhesive may occasionally be necessary to hold your dentures in place more securely.  The dentist will let you know when one is recommended for you.  SPEAKING Wearing dentures will change the sound of your voice initially.  This will be noticed by you more than anyone else.  Bite and swallow before you speak, in order to place your dentures in position so that you may speak more clearly.  Practice speaking by reading aloud or counting from 1 to 100 very slowly and distinctly.  After some practice your mouth will become accustomed to your dentures and you will speak more clearly.  EATING Chewing will definitely be different after you receive your dentures.  With a little practice and patience you should be able to eat just about any kind of food.  Begin by eating small quantities of food  that are cut into small pieces.  Star with soft foods such as eggs, cooked vegetables, or puddings.  As you gain confidence advance  Your diet to whatever texture foods you can tolerate.  DENTURE CARE Dentures can collect plaque and calculus much the same as natural teeth can.  If not removed on a regular basis, your dentures will not look or feel clean, and you will experience denture odor.  It is very important that you remove your dentures at bedtime and clean them thoroughly.  You should: 1. Clean your dentures over a sink full of water so if dropped, breakage will be prevented. 2. Rinse your dentures with cool water to remove any large food particles. 3. Use soap and water or a denture cleanser or paste to clean the dentures.  Do not use regular toothpaste as it may abrade the denture base or teeth. 4. Use a moistened denture brush to clean all surfaces (inside and outside). 5. Rinse thoroughly to remove any remaining soap or denture cleanser. 6. Use a soft bristle toothbrush to gently brush any natural teeth, gums, tongue, and palate at bedtime and before reinserting your dentures. 7. Do not sleep with your dentures in your mouth at night.  Remove your dentures and soak them overnight in a denture cup filled with water or denture solution as recommended by your dentist.  This routine will become second nature and will increase the life and comfort   of your dentures.  Please do not try to adjust these dentures yourself; you could damage them.  FOLLOW-UP You should call or make an appointment with your dentist.  Your dentist would like to see you at least once a year for a check-up and examination. 

## 2014-07-29 ENCOUNTER — Telehealth: Payer: Self-pay | Admitting: *Deleted

## 2014-07-29 ENCOUNTER — Other Ambulatory Visit: Payer: Self-pay | Admitting: Hematology and Oncology

## 2014-07-29 ENCOUNTER — Encounter: Payer: Self-pay | Admitting: Hematology and Oncology

## 2014-07-29 NOTE — Telephone Encounter (Signed)
Refill already written. Family already called it is ready for pickup

## 2014-07-29 NOTE — Telephone Encounter (Signed)
Returned call from patient's wife who states she called [facility] outpt pharmacy for Restoril medication for her husband and was told the pharmacy did not have refill request.  Med ordered by Dr Alvy Bimler. Unable to reach Dr Calton Dach RN by phone. Per Epic, Restoril entered on 07/29/14; called pharmacy and was informed pharmacy does not have refill from Dr Alvy Bimler.  Per Dr Isidore Moos, faxed Epic Restoril order note dated 07/29/14 to [facility] and phoned pharmacist back to inform her. Pharmacist stated she would call patient if unable to fill this prescription. This RN called Ms Hemphill and informed her of above; she verbalized understanding.

## 2014-08-01 ENCOUNTER — Ambulatory Visit (HOSPITAL_COMMUNITY): Payer: Self-pay | Admitting: Dentistry

## 2014-08-01 ENCOUNTER — Encounter (HOSPITAL_COMMUNITY): Payer: Self-pay | Admitting: Dentistry

## 2014-08-01 VITALS — BP 125/76 | HR 68

## 2014-08-01 DIAGNOSIS — K08109 Complete loss of teeth, unspecified cause, unspecified class: Secondary | ICD-10-CM

## 2014-08-01 NOTE — Patient Instructions (Signed)
Patient to keep dentures out if sore spots develop. Use salt water rinses as needed to aid healing. Return to clinic as scheduled for denture adjustment.   Call if problems arise before then.  Kylyn Sookram F. Makaelyn Aponte, DDS  

## 2014-08-01 NOTE — Progress Notes (Signed)
08/01/2014  Patient Name:   Angel [name] Date of Birth:   March 10, 1951 Medical Record Number: [id number]  BP 125/76 mmHg  Pulse 68   Angel [name] presents for evaluation of recently inserted upper and lower complete dentures.  SUBJECTIVE: The patient is complaining of denture irritation to the maxillary right buccal extension and lower right lingual frenum area. OBJECTIVE: There is no sign of erythema or denture ulceration. Procedure: Pressure indicating paste was applied to the dentures. Adjustments were made as needed. [place] Thick PIP was applied to the denture borders. Adjustments were made as needed. [place] Occlusion evaluated and adjustments made as needed for Centric Relation and protrusive strokes. The patient still with tendency to protrude mandible in a protrusive position. Patient was provided instructions in finding maximum intercuspation position. Patient accepts results. Patient to keep dentures out if sore spots develop. Use salt water rinses as needed to aid healing. Return to clinic as scheduled for denture adjustment.   Call if problems arise before then.  Lenn Cal, DDS

## 2014-08-08 ENCOUNTER — Ambulatory Visit: Payer: 59 | Attending: Radiation Oncology | Admitting: Physical Therapy

## 2014-08-08 DIAGNOSIS — M25612 Stiffness of left shoulder, not elsewhere classified: Secondary | ICD-10-CM | POA: Insufficient documentation

## 2014-08-08 DIAGNOSIS — I890 Lymphedema, not elsewhere classified: Secondary | ICD-10-CM | POA: Insufficient documentation

## 2014-08-08 DIAGNOSIS — R6889 Other general symptoms and signs: Secondary | ICD-10-CM | POA: Diagnosis not present

## 2014-08-08 DIAGNOSIS — M436 Torticollis: Secondary | ICD-10-CM | POA: Diagnosis not present

## 2014-08-08 NOTE — Therapy (Addendum)
[place], [place], [address] Phone: [phone]   Fax:  [phone]  Physical Therapy Treatment  Patient Details  Name: Angel [name] MRN: [id number] Date of Birth: [birth year] Referring Provider:  [name], MD  Encounter Date: 08/08/2014      PT End of Session - 08/08/14 1703    Visit Number 2   Number of Visits 4   Date for PT Re-Evaluation 09/17/14   PT Start Time 1608   PT Stop Time 1655   PT Time Calculation (min) 47 min      Past Medical History  Diagnosis Date  . Constipation due to pain medication   . Malignant neoplasm of base of tongue     left neck lymph and Left BOT cancer  . Current smoker   . 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 Insomnia 11/12/2013  . Hypomagnesemia 11/12/2013  . Thrush of mouth and esophagus 02/21/2014  . S/P radiation therapy 01/26/2014-03/18/2014    base of tongue, bilateral neck/ 70 Gy/35 fx,   . Lymphedema 05/05/2014    Past Surgical History  Procedure Laterality Date  . Testicle surgery      as a infant  . Appendectomy    . Mandible surgery      from [place]  . Back surgery      Lumbar  . Tonsillectomy    . Panendoscopy N/A 10/04/2013    Procedure: Direct Laryngoscopy  WITH BIOPSY;  Surgeon: Melida Quitter, MD;  Location: [place];  Service: ENT;  Laterality: N/A;  . Esophagoscopy  10/04/2013    Procedure: ESOPHAGOSCOPY;  Surgeon: Melida Quitter, MD;  Location: [place];  Service: ENT;;  . Multiple extractions with alveoloplasty N/A 10/08/2013    Procedure: extraction of tooth #'s 2,3,4,5,6,11,12,13,14,15,18,19,20,21,22,23,24,25,26, 27,28, 29, 31 with alveoloplasty ;  Surgeon: Lenn Cal, DDS;  Location: [place];  Service: Oral Surgery;  Laterality: N/A;    There were no vitals filed for this visit.  Visit Diagnosis:  Acquired lymphedema      Subjective Assessment - 08/08/14 1609     Subjective Nothing new.  Neck still stiff; left arm still starts hurting when he goes to elevate it.   Currently in Pain? No/denies                       [facility] Adult PT Treatment/Exercise - 08/08/14 0001    Manual Therapy   Manual Therapy Edema management;Manual Lymphatic Drainage (MLD)   Edema Management Fabricated foam chip pack in stockinette, fit it to patient, and instructed him in its use.   Manual Lymphatic Drainage (MLD) Short neck, bilateral anterior shoulders, bilateral axillae, jaw, cheeks, and preauricular area.  This was done with patient in supine with head inclined and then instructed to patient with him in sitting in front of mirror.                PT Education - 08/08/14 1700    Education provided Yes   Education Details self-manual lymph drainage, use of foam chip pack for compression 4 hours a day (not while sleeping)   Person(s) Educated Patient   Methods Explanation;Demonstration;Handout   Comprehension Verbalized understanding;Returned demonstration                [place] - 07/19/14 1614    CC Long Term Goal  #1  Title Patient will be independent in self-manual lymph drainage for his neck swelling.   Baseline no knowledge   Time 3   Period Weeks   Status New   CC Long Term Goal  #2   Title Patient will be knowledgeable about compression garments for neck swelling.   Baseline no knowledge   Time 3   Period Weeks   Status New   CC Long Term Goal  #3   Title Pt. will be independent in home exercise program for neck and left shoulder ROM and left shoulder strengthening.   Baseline limited knowledge   Time 3   Period Weeks   Status New            Plan - 08/08/14 1703    Clinical Impression Statement Patient did fairly well with instructions in use of chip pack and manual lymph drainage today, but needs practice on self-manual lymph drainage.   Pt will benefit from skilled therapeutic intervention in  order to improve on the following deficits Increased edema;Increased fascial restricitons;Decreased range of motion;Impaired UE functional use   Rehab Potential Good   PT Frequency 1x / week   PT Duration 3 weeks   PT Treatment/Interventions Manual lymph drainage;Patient/family education;ADLs/Self Care Home Management   PT Next Visit Plan Review self manual lymph drainage; show neck and shoulder ROM and teach HEP; show manufactured compression garment options.   Consulted and Agree with Plan of Care Patient        Problem List Patient Active Problem List   Diagnosis Date Noted  . Lymphedema 05/05/2014  . Mucositis due to antineoplastic therapy 02/21/2014  . Headache 01/25/2014  . Tobacco abuse 01/25/2014  . Preventive measure 01/01/2014  . Diarrhea 11/15/2013  . Bilateral leg edema 11/15/2013  . Insomnia 11/12/2013  . Hypomagnesemia 11/12/2013  . Generalized weakness 10/27/2013  . Fall 10/27/2013  . Thrombocytosis 10/27/2013  . Protein-calorie malnutrition, severe 10/26/2013  . Leukocytosis, unspecified 10/25/2013  . Neck pain 10/14/2013  . Anemia in neoplastic disease 10/05/2013  . Weight loss 10/05/2013  . Cancer of base of tongue 09/17/2013    Yina Riviere 08/08/2014, 5:05 PM  [place], [place], [address] Phone: [phone]   Fax:  [place], PT 08/08/2014 5:06 PM    PHYSICAL THERAPY DISCHARGE SUMMARY  Visits from Start of Care: 2  Current functional level related to goals / functional outcomes: Goals partially met as noted above.   Remaining deficits: Unknown:  Patient did not complete his course of therapy.   Education / Equipment: Self-manual lymph drainage, foam chip pack for compression of neck swelling. Plan: Patient agrees to discharge.  Patient goals were partially met. Patient is being discharged due to financial reasons.  ?????        Serafina Royals, PT 06/01/2015 5:46 PM

## 2014-08-08 NOTE — Patient Instructions (Addendum)
Manual Lymph Drainage for face/neck   Do this sitting in front of a mirror.  1) Place hands on areas just above collar bones and do 15-20 stationary circles with pressure outward toward shoulders 2) Place hands on either side of neck and do 15-20 stationary circles with pressure back and downwards 3) Do stationary circles at each side of neck, a little more toward the front than in #2, 15-20 times. 4) Do stationary circles at right armpit area (about 10 times) and the left armpit (about 10 times) 5) Do stationary circles at the front of each shoulder, with pressure on with the downward stroke, toward underarms. 6) Do stationary circles on each side of the face at the jaw line (15-20) 7) Do stationary circles on each side of the face on the cheeks (15-20) 8) Do stationary circles on each side of the face between the eyes and ears (15-20) 9) Repeat step 3, then 2 and 1 10) Repeat step 4 and then 5     Do not slide on the skin Only give enough pressure to stretch the skin Make sure to always wash your hands prior to massage

## 2014-08-09 ENCOUNTER — Encounter (HOSPITAL_COMMUNITY): Payer: Self-pay | Admitting: Dentistry

## 2014-08-09 ENCOUNTER — Ambulatory Visit (HOSPITAL_COMMUNITY): Payer: Medicaid - Dental | Admitting: Dentistry

## 2014-08-09 VITALS — BP 109/67 | HR 58 | Temp 97.6°F

## 2014-08-09 DIAGNOSIS — Z9221 Personal history of antineoplastic chemotherapy: Secondary | ICD-10-CM

## 2014-08-09 DIAGNOSIS — Z463 Encounter for fitting and adjustment of dental prosthetic device: Secondary | ICD-10-CM

## 2014-08-09 DIAGNOSIS — K08109 Complete loss of teeth, unspecified cause, unspecified class: Secondary | ICD-10-CM

## 2014-08-09 DIAGNOSIS — Z923 Personal history of irradiation: Secondary | ICD-10-CM

## 2014-08-09 DIAGNOSIS — C01 Malignant neoplasm of base of tongue: Secondary | ICD-10-CM

## 2014-08-09 DIAGNOSIS — K117 Disturbances of salivary secretion: Secondary | ICD-10-CM

## 2014-08-09 DIAGNOSIS — R682 Dry mouth, unspecified: Secondary | ICD-10-CM

## 2014-08-09 NOTE — Progress Notes (Signed)
08/09/2014  Patient Name:   Angel [name] Date of Birth:   1950/06/18 Medical Record Number: [id number]  BP 109/67 mmHg  Pulse 58  Temp(Src) 97.6 F (36.4 C) (Oral)   [name] presents for evaluation of recently inserted upper and lower complete dentures.  SUBJECTIVE: The patient is not complaining of any denture irritation. OBJECTIVE: There is no sign of erythema or denture ulceration. Procedure: Pressure indicating paste was applied to the dentures. Adjustments were made as needed. [place] Occlusion evaluated and adjustments made as needed for Centric Relation and protrusive strokes. The patient still with tendency to protrude mandible in a protrusive position.  Patient was again provided instructions on finding maximum intercuspation position. Patient accepts results. Patient to keep dentures out if sore spots develop. Use salt water rinses as needed to aid healing. Return to clinic as scheduled for denture adjustment.   Call if problems arise before then.  Lenn Cal, DDS

## 2014-08-09 NOTE — Patient Instructions (Addendum)
Patient to keep dentures out if sore spots develop. Use salt water rinses as needed to aid healing. Return to clinic as scheduled for denture adjustment.   Call if problems arise before then.  Ronald F. Kulinski, DDS  

## 2014-08-10 ENCOUNTER — Ambulatory Visit: Payer: 59 | Admitting: Physical Therapy

## 2014-08-15 ENCOUNTER — Ambulatory Visit: Payer: 59 | Admitting: Physical Therapy

## 2014-08-30 ENCOUNTER — Other Ambulatory Visit: Payer: Self-pay | Admitting: Hematology and Oncology

## 2014-08-30 ENCOUNTER — Telehealth: Payer: Self-pay | Admitting: *Deleted

## 2014-08-30 NOTE — Telephone Encounter (Signed)
Informed wife of Rx for Restoril for 30 days per Dr. Alvy Bimler ready to pick up.   Instructed pt to get further rxs from PCP since Dr. Alvy Bimler does not need to see pt anymore.  Wife says pt does not have PCP.  Instructed her pt should arrange to have PCP as it is very important for his health maintenance and appropriate for PCP to manage insomnia and any other non cancer related health problems pt may have.  She verbalized understanding.

## 2014-09-09 ENCOUNTER — Other Ambulatory Visit: Payer: Self-pay | Admitting: Radiation Oncology

## 2014-09-09 DIAGNOSIS — C01 Malignant neoplasm of base of tongue: Secondary | ICD-10-CM

## 2014-09-12 ENCOUNTER — Telehealth: Payer: Self-pay | Admitting: *Deleted

## 2014-09-12 ENCOUNTER — Encounter (HOSPITAL_COMMUNITY): Payer: Self-pay | Admitting: Dentistry

## 2014-09-12 ENCOUNTER — Ambulatory Visit (HOSPITAL_COMMUNITY): Payer: Medicaid - Dental | Admitting: Dentistry

## 2014-09-12 VITALS — BP 120/73 | HR 67 | Temp 98.6°F

## 2014-09-12 DIAGNOSIS — K08109 Complete loss of teeth, unspecified cause, unspecified class: Secondary | ICD-10-CM

## 2014-09-12 DIAGNOSIS — Z463 Encounter for fitting and adjustment of dental prosthetic device: Secondary | ICD-10-CM

## 2014-09-12 DIAGNOSIS — C01 Malignant neoplasm of base of tongue: Secondary | ICD-10-CM

## 2014-09-12 DIAGNOSIS — Z923 Personal history of irradiation: Secondary | ICD-10-CM

## 2014-09-12 DIAGNOSIS — Z9221 Personal history of antineoplastic chemotherapy: Secondary | ICD-10-CM

## 2014-09-12 DIAGNOSIS — K117 Disturbances of salivary secretion: Secondary | ICD-10-CM

## 2014-09-12 DIAGNOSIS — R682 Dry mouth, unspecified: Secondary | ICD-10-CM

## 2014-09-12 NOTE — Progress Notes (Signed)
09/12/2014  Patient Name:   Angel [name] Date of Birth:   [birth year] Medical Record Number: [id number]  BP 120/73 mmHg  Pulse 67  Temp(Src) 98.6 F (103 C) (Oral)   [name] presents for evaluation of recently inserted upper and lower complete dentures.  Orthopantogram was also taken to evaluate area of previous periapical radiolucency at the apex of tooth number 28. SUBJECTIVE: The patient is not complaining of any denture irritation.  OBJECTIVE: There is no sign of erythema or denture ulceration. The patient is edentulous. The patient has atrophy of the edentulous alveolar ridges. Orthopantogram was reviewed and there is a persistent periapical radiolucency in the area of #28 although this appears to be filling in and has less radiolucency then noted on the 10/07/2013 full series. This most likely represents a residual periapical cyst. There is no evidence of mandible fracture in the area #28 per review of the orthopantogram. The patient denies having any tenderness to palpation in the area of tooth #28. Upper and lower dentures appear to be stable and retentive. Procedure: Pressure indicating paste was applied to the dentures. Adjustments were made as needed. [place] Occlusion evaluated and adjustments made as needed for Centric Relation and protrusive strokes. The patient still with tendency to protrude mandible in a protrusive position.  Patient was again provided instructions on finding maximum intercuspation position. Patient accepts results. Patient to keep dentures out if sore spots develop. Use salt water rinses as needed to aid healing. Return to clinic as scheduled for denture adjustment.   Call if problems arise before then.  Lenn Cal, DDS

## 2014-09-12 NOTE — Patient Instructions (Signed)
Patient to keep dentures out if sore spots develop. Use salt water rinses as needed to aid healing. Return to clinic as scheduled for denture adjustment.   Call if problems arise before then.  Ronald F. Kulinski, DDS  

## 2014-09-12 NOTE — Telephone Encounter (Signed)
CALLED PATIENT TO INFORM THAT PET SCAN HAS BEEN CANCELLED PER DR. SQUIRE ON 09-22-14 AND A CT ARRANGED INSTEAD FOR 09-22-14, SPOKE WITH PATIENT'S WIFE BETSY AND SHE IS AWARE OF THIS TEST CHANGE AND HIS FU WITH DR. Isidore Moos ON 09-23-14.

## 2014-09-15 ENCOUNTER — Telehealth: Payer: Self-pay | Admitting: *Deleted

## 2014-09-15 NOTE — Telephone Encounter (Signed)
Received disability claim form from patient for Dr. Isidore Moos to sign   [place]

## 2014-09-21 ENCOUNTER — Ambulatory Visit: Payer: Medicaid Other

## 2014-09-21 ENCOUNTER — Telehealth: Payer: Self-pay | Admitting: *Deleted

## 2014-09-21 NOTE — Telephone Encounter (Signed)
CALLED PATIENT TO REMIND OF LAB, AND CT FOR 09-22-14, SPOKE WITH PATIENT'S WIFE AND SHE IS AWARE OF THESE APPTS.

## 2014-09-22 ENCOUNTER — Ambulatory Visit (HOSPITAL_BASED_OUTPATIENT_CLINIC_OR_DEPARTMENT_OTHER)
Admission: RE | Admit: 2014-09-22 | Discharge: 2014-09-22 | Disposition: A | Discharge status: Home / Self Care | Payer: 59 | Source: Ambulatory Visit | Attending: Radiation Oncology | Admitting: Radiation Oncology

## 2014-09-22 ENCOUNTER — Ambulatory Visit (HOSPITAL_COMMUNITY)
Admission: RE | Admit: 2014-09-22 | Discharge: 2014-09-22 | Disposition: A | Discharge status: Home / Self Care | Payer: 59 | Source: Ambulatory Visit | Attending: Radiation Oncology | Admitting: Radiation Oncology

## 2014-09-22 ENCOUNTER — Ambulatory Visit (HOSPITAL_COMMUNITY): Payer: Medicaid Other

## 2014-09-22 DIAGNOSIS — Z85810 Personal history of malignant neoplasm of tongue: Secondary | ICD-10-CM | POA: Insufficient documentation

## 2014-09-22 DIAGNOSIS — C01 Malignant neoplasm of base of tongue: Secondary | ICD-10-CM

## 2014-09-22 DIAGNOSIS — R911 Solitary pulmonary nodule: Secondary | ICD-10-CM | POA: Diagnosis not present

## 2014-09-22 LAB — BUN AND CREATININE (CC13)
BUN: 8.4 mg/dL (ref 7.0–26.0)
Creatinine: 0.9 mg/dL (ref 0.7–1.3)

## 2014-09-22 MED ORDER — IOHEXOL 300 MG/ML  SOLN
100.0000 mL | Freq: Once | INTRAMUSCULAR | Status: AC | PRN
  Administered 2014-09-22: 100 mL via INTRAVENOUS

## 2014-09-23 ENCOUNTER — Encounter: Payer: Self-pay | Admitting: Radiation Oncology

## 2014-09-23 ENCOUNTER — Ambulatory Visit
Admission: RE | Admit: 2014-09-23 | Discharge: 2014-09-23 | Disposition: A | Discharge status: Home / Self Care | Payer: 59 | Source: Ambulatory Visit | Attending: Radiation Oncology | Admitting: Radiation Oncology

## 2014-09-23 VITALS — BP 115/79 | HR 83 | Temp 99.0°F | Resp 16 | Ht 65.0 in | Wt 112.4 lb

## 2014-09-23 DIAGNOSIS — C01 Malignant neoplasm of base of tongue: Secondary | ICD-10-CM

## 2014-09-23 NOTE — Progress Notes (Signed)
Radiation Oncology         [id number]) [phone] ________________________________  Name: Angel [name] MRN: [id number]  Date: 04/04/2014  DOB: [birth year]  Follow-Up Visit Note  CC: [name], MD  [name], MD  Diagnosis and Prior Radiotherapy:        ICD-9-CM ICD-10-CM   1. Cancer of base of tongue 141.0 C01 CT Soft Tissue Neck W Contrast   T2N3M0 IVB squamous cell carcinoma, base of tongue  Indication for treatment:  Curative, following induction chemotherapy       Radiation treatment dates:   01/26/2014-03/18/2014  Site/dose:   Base of tongue and bilateral neck / 70 Gy in 35 fractions to gross disease, 63 Gy in 35 fractions to high risk nodal echelons, and 56 Gy in 35 fractions to intermediate risk nodal echelons  Narrative:  The patient returns today for routine follow-up. The pt still smokes at this time. Pain Status: He has behind the neck pain when he sits for long periods of time. Describes it as a shocking pain and is located just to that spot and does not travel down the spine. Physical therapy did not help this. Nutritional Status a) intake: Has a new set of dentures and is learning how to chew with them. He is eating mostly softer foods.  b) using a feeding tube?: PEG removed. No. c) weight changes, if any: down 2 lbs since his last visit.  Swallowing Status: swallowing without any complaints. Pt denies a sore throat. Dental (if applicable): When was last visit with dentistry 1 week ago.   When was last ENT visit? Has not had an appointment since his biopsy. When is next ENT visit? unknown  Other notable issues, if any: he reports a dry mouth - using biotene.  ALLERGIES:  has No Known Allergies.  Meds:  Current outpatient prescriptions:  .  acetaminophen (TYLENOL) 325 MG tablet, Take 650 mg by mouth every 6 (six) hours as needed for mild pain or moderate pain., Disp: , Rfl:  .  antiseptic oral rinse (BIOTENE) LIQD, 15 mLs by Mouth Rinse route 2 (two) times daily., Disp:  1 Bottle, Rfl: 2 .  Multiple Vitamin (MULTIVITAMIN) tablet, Take 1 tablet by mouth daily., Disp: , Rfl:  .  albuterol (PROVENTIL) (2.5 MG/3ML) 0.083% nebulizer solution, Take 3 mLs (2.5 mg total) by nebulization every 6 (six) hours as needed for wheezing or shortness of breath. (Patient not taking: Reported on 07/15/2014), Disp: 75 mL, Rfl: 12 .  fentaNYL (DURAGESIC - DOSED MCG/HR) 12 MCG/HR, Place 1 patch (12.5 mcg total) onto the skin every 3 (three) days. (Patient not taking: Reported on 06/21/2014), Disp: 10 patch, Rfl: 0 .  pantoprazole (PROTONIX) 40 MG tablet, Take 1 tablet (40 mg total) by mouth daily. (Patient not taking: Reported on 07/15/2014), Disp: 90 tablet, Rfl: 2 .  prochlorperazine (COMPAZINE) 10 MG tablet, Take 1 tablet (10 mg total) by mouth every 6 (six) hours as needed for nausea or vomiting. (Patient not taking: Reported on 06/21/2014), Disp: 60 tablet, Rfl: 3 .  senna-docusate (SENOKOT-S) 8.6-50 MG per tablet, Take 2 tablets by mouth 2 (two) times daily. (Patient not taking: Reported on 06/21/2014), Disp: 120 tablet, Rfl: 0 .  temazepam (RESTORIL) 15 MG capsule, TAKE 1 CAPSULE BY MOUTH ONCE AT BEDTIME (Patient not taking: Reported on 09/23/2014), Disp: 30 capsule, Rfl: 0   Physical Findings:  Filed Vitals:   09/23/14 1411  BP: 115/79  Pulse: 83  Temp: 99 F (37.2 C)  Resp: 16  General: Alert and oriented, in no acute distress HEENT: Head is normocephalic. Extraocular movements are intact. Oropharynx is clear with no thrush or lesions. Neck: Neck is supple, no palpable cervical or supraclavicular lymphadenopathy. Heart: Regular in rate and rhythm with no murmurs, rubs, or gallops. Chest: Clear to auscultation bilaterally, with no rhonchi, wheezes, or rales. Abdomen: Soft, nontender, nondistended, with no rigidity or guarding. PEG was removed Extremities: No cyanosis or edema. Lymphatics: see Neck Exam Skin: No concerning lesions. Musculoskeletal: symmetric strength and  muscle tone throughout. Neurologic: Cranial nerves II through XII are grossly intact. No obvious focalities. Speech is fluent. Coordination is intact. Psychiatric: Judgment and insight are intact. Affect is appropriate.   Lab Findings:  CBC    Component Value Date/Time   WBC 5.0 03/17/2014 1615   WBC 3.8* 11/06/2013 0445   RBC 4.28 03/17/2014 1615   RBC 2.91* 11/06/2013 0445   HGB 13.3 03/17/2014 1615   HGB 9.4* 11/06/2013 0445   HCT 40.8 03/17/2014 1615   HCT 26.8* 11/06/2013 0445   PLT 289 03/17/2014 1615   PLT 156 11/06/2013 0445   MCV 95.3 03/17/2014 1615   MCV 92.1 11/06/2013 0445   MCH 31.1 03/17/2014 1615   MCH 32.3 11/06/2013 0445   MCHC 32.6 03/17/2014 1615   MCHC 35.1 11/06/2013 0445   RDW 15.1* 03/17/2014 1615   RDW 14.0 11/06/2013 0445   LYMPHSABS 0.4* 03/17/2014 1615   LYMPHSABS 1.1 11/06/2013 0445   MONOABS 0.5 03/17/2014 1615   MONOABS 0.7 11/06/2013 0445   EOSABS 0.6* 03/17/2014 1615   EOSABS 0.0 11/06/2013 0445   BASOSABS 0.1 03/17/2014 1615   BASOSABS 0.0 11/06/2013 0445   CMP     Component Value Date/Time   NA 138 03/17/2014 1616   NA 133* 11/06/2013 0445   K 4.3 03/17/2014 1616   K 3.5* 11/06/2013 0445   CL 98 11/06/2013 0445   CO2 27 03/17/2014 1616   CO2 21 11/06/2013 0445   GLUCOSE 92 03/17/2014 1616   GLUCOSE 99 11/06/2013 0445   BUN 8.4 09/22/2014 1417   BUN 11 11/06/2013 0445   CREATININE 0.9 09/22/2014 1417   CREATININE 0.63 11/06/2013 0445   CALCIUM 9.3 03/17/2014 1616   CALCIUM 7.0* 11/06/2013 0445   PROT 6.4 03/17/2014 1616   PROT 5.6* 11/03/2013 2220   ALBUMIN 3.9 03/17/2014 1616   ALBUMIN 2.5* 11/03/2013 2220   AST 12 03/17/2014 1616   AST 7 11/03/2013 2220   ALT 7 03/17/2014 1616   ALT 9 11/03/2013 2220   ALKPHOS 54 03/17/2014 1616   ALKPHOS 53 11/03/2013 2220   BILITOT 0.26 03/17/2014 1616   BILITOT 0.6 11/03/2013 2220   GFRNONAA >90 11/06/2013 0445   GFRAA >90 11/06/2013 0445    Lab Results  Component Value Date    TSH 1.488 07/14/2014    Radiographic Findings: Ct Soft Tissue Neck W Contrast  09/23/2014   CLINICAL DATA:  Tongue base cancer. History of radiation therapy. Subsequent treatment strategy.  EXAM: CT NECK WITH CONTRAST  TECHNIQUE: Multidetector CT imaging of the neck was performed using the standard protocol following the bolus administration of intravenous contrast.  CONTRAST:  168mL OMNIPAQUE IOHEXOL 300 MG/ML  SOLN  COMPARISON:  PET scan 07/14/2014.  FINDINGS: Pharynx and larynx: Post radiation changes are noted within the oral cavity and tongue base. There is chronic edematous change of the supraglottic larynx. Vocal cords are midline and symmetric. No discrete lesion is evident.  Salivary glands: The salivary glands are hyperdense  following radiation. No focal lesions are present.  Thyroid: Negative  Lymph nodes: A calcified posterior left level 2 lymph node corresponds to the area of previously noted residual FDG activity. This is immediately posterior to the carotid sheath without a discernible fat plane from the overlying sternocleidomastoid.  No new adenopathy is present.  Vascular: Atherosclerotic calcifications are again noted at the right greater than left carotid bifurcation without significant stenosis.  Limited intracranial: Negative  Visualized orbits: Negative  Mastoids and visualized paranasal sinuses: Clear. A remote right orbital blowout fracture is again seen.  Skeleton: Chronic degenerative changes are noted in the cervical spine with stable rightward curvature. With  Upper chest: Emphysematous changes are noted at the lung apices. Mild scarring is stable. This may be related to radiation.  IMPRESSION: 1. Calcified posterior left level 2 lymph node is again noted. This corresponds with the area of residual FDG uptake on the most recent PET scan. Follow-up PET would be necessary to evaluate residual metabolic activity. 2. No new adenopathy or nodes with increasing size. 3. No evidence for  residual or recurrent primary tumor. 4. Diffuse post radiation changes of the neck.   Electronically Signed   By: San Morelle M.D.   On: 09/23/2014 06:47   Ct Chest W Contrast  09/22/2014   CLINICAL DATA:  Followup of tongue base cancer.  Lung nodule.  EXAM: CT CHEST WITH CONTRAST  TECHNIQUE: Multidetector CT imaging of the chest was performed during intravenous contrast administration.  CONTRAST:  178mL OMNIPAQUE IOHEXOL 300 MG/ML  SOLN  COMPARISON:  PET of 07/14/2014.  No prior diagnostic CTs.  FINDINGS: Mediastinum/Nodes: Aortic and branch vessel atherosclerosis. Normal heart size, without pericardial effusion. LAD and probable left main coronary artery atherosclerosis. No central pulmonary embolism, on this non-dedicated study. No mediastinal or hilar adenopathy. Distal esophagus is mildly thick walled, likely chronic. Example image 46.  Lungs/Pleura: No pleural fluid. Lower lobe predominant bronchial wall thickening. Mild centrilobular emphysema.  Resolution of previously described left upper lobe nodular density.  No new pulmonary nodules to suggest metastatic disease.  Upper abdomen: Normal imaged portions of the liver, spleen, stomach, pancreas, adrenal glands, kidneys.  Musculoskeletal: Mid thoracic degenerative disc disease. S-shaped thoracic spine curvature.  IMPRESSION: 1. Resolution of left upper lobe nodule, likely infectious or inflammatory. 2.  No acute process or evidence of metastatic disease in the chest. 3. Distal esophageal wall thickening is felt to be similar and suspicious for esophagitis. 4.  Atherosclerosis, including within the coronary arteries.   Electronically Signed   By: Abigail Miyamoto M.D.   On: 09/22/2014 17:02   Impression/Plan:    1) Head and Neck Cancer Status: No evidence of disease.  2) Nutritional Status: He got some new dentures and is chewing soft foods at this moment. - weight: Down 2 lbs since March - PEG tube: Removed  3) Risk Factors: The patient has  been educated about risk factors including alcohol and tobacco abuse; they understand that avoidance of alcohol and tobacco is important to prevent recurrences as well as other cancers. The patient continues to use smoking tobacco. The patient was counseled to stop using tobacco: materials given to education him and provide counseling options; such as our head and neck cancer navigator.   4) Swallowing: No complaints  5) Dental: Edentulous.  6) Thyroid function: normal TSH   Lab Results  Component Value Date   TSH 1.488 07/14/2014    7) Social: Still smoking, community resources discussed.  8) Follow-up plan:  See me back in 3 months with repeat CT scan of the neck. PET would not be approved by insurance. Discuss plan at tumor board  9) OTHER: The pt's Medicaid runs out at the end of this month and he inquired about PCPs. I advised him to look into the physicians that are at his wife's practice since he is transitioning onto her insurance.  This document serves as a record of services personally performed by Eppie Gibson, MD. It was created on her behalf by Darcus Austin, a trained medical scribe. The creation of this record is based on the scribe's personal observations and the provider's statements to them. This document has been checked and approved by the attending provider.     _____________________________________   Eppie Gibson, MD

## 2014-09-23 NOTE — Progress Notes (Signed)
Pain Status: none - denies trouble swallowing  Weight changes, if any: down 2 lbs since March.    Nutritional Status a) intake: Has a new set of dentures and is learning how to chew with them.  He is eating mostly softer foods. b) using a feeding tube?: no c) weight changes, if any: down 2 lbs since his last visit.  Swallowing Status: swallowing without any complaints  Dental (if applicable): When was last visit with dentistry 1 week ago Using fluoride trays daily? no   When was last ENT visit? Has not had an appointment since his biopsy   When is next ENT visit? unknown  Other notable issues, if any: he reports a dry mouth - using biotene.  No mouth sores noted.  He does have a yellowish coating on his tongue.  BP 115/79 mmHg  Pulse 83  Temp(Src) 99 F (37.2 C) (Oral)  Resp 16  Ht 5\' 5"  (1.651 m)  Wt 112 lb 6.4 oz (50.984 kg)  BMI 18.70 kg/m2  SpO2 97%   Wt Readings from Last 3 Encounters:  07/15/14 114 lb (51.71 kg)  05/05/14 113 lb 4.8 oz (51.393 kg)  04/14/14 114 lb 3.2 oz (51.801 kg)

## 2014-09-27 ENCOUNTER — Telehealth: Payer: Self-pay | Admitting: *Deleted

## 2014-09-27 NOTE — Telephone Encounter (Signed)
Called patient to inform of CT and fu, lvm for a return call 

## 2014-11-03 ENCOUNTER — Encounter: Payer: Self-pay | Admitting: Radiation Oncology

## 2014-11-03 NOTE — Progress Notes (Signed)
Spoke with patient's wife and explained to her that I had spoken previously with patient about his disability.  I told her that Dr. Isidore Moos could not qualify him for disability based on his oncology history alone, given he is in remission.  It is recommended that he talk with his PCP or talk with a doctor that specializes in permanent disability.

## 2014-12-12 ENCOUNTER — Ambulatory Visit (HOSPITAL_COMMUNITY): Payer: Medicaid - Dental | Admitting: Dentistry

## 2014-12-12 ENCOUNTER — Encounter (HOSPITAL_COMMUNITY): Payer: Self-pay | Admitting: Dentistry

## 2014-12-12 VITALS — BP 96/66 | HR 69 | Temp 98.2°F

## 2014-12-12 DIAGNOSIS — Z463 Encounter for fitting and adjustment of dental prosthetic device: Secondary | ICD-10-CM

## 2014-12-12 DIAGNOSIS — R682 Dry mouth, unspecified: Secondary | ICD-10-CM

## 2014-12-12 DIAGNOSIS — K08109 Complete loss of teeth, unspecified cause, unspecified class: Secondary | ICD-10-CM

## 2014-12-12 DIAGNOSIS — Z923 Personal history of irradiation: Secondary | ICD-10-CM

## 2014-12-12 DIAGNOSIS — K117 Disturbances of salivary secretion: Secondary | ICD-10-CM

## 2014-12-12 DIAGNOSIS — C01 Malignant neoplasm of base of tongue: Secondary | ICD-10-CM

## 2014-12-12 DIAGNOSIS — Z9221 Personal history of antineoplastic chemotherapy: Secondary | ICD-10-CM

## 2014-12-12 NOTE — Patient Instructions (Signed)
Patient to keep dentures out if sore spots develop. Use salt water rinses as needed to aid healing. Return to clinic as scheduled for denture adjustment.   Call if problems arise before then.  Heiress Williamson F. Areeb Corron, DDS  

## 2014-12-12 NOTE — Progress Notes (Signed)
12/12/2014  Patient Name:   Angel [name] Date of Birth:   10-29-1950 Medical Record Number: [id number]  BP 96/66 mmHg  Pulse 69  Temp(Src) 98.2 F (36.8 C) (Oral)   [name] presents for evaluation of recently inserted upper and lower complete dentures.   SUBJECTIVE: The patient is not complaining of any denture irritation.  OBJECTIVE: There is no sign of erythema or denture ulceration. The patient is edentulous. The patient has atrophy of the edentulous alveolar ridges. Upper and lower dentures appear to be stable and retentive.  Procedure: Pressure indicating paste was applied to the dentures. Adjustments were made as needed. [place] Occlusion evaluated and adjustments made as needed for Centric Relation and protrusive strokes. The patient still with tendency to protrude mandible in a protrusive position.  Patient was again provided instructions on finding maximum intercuspation position. Patient accepts results. Patient to keep dentures out if sore spots develop. Use salt water rinses as needed to aid healing. Return to clinic as scheduled for denture adjustment.   Call if problems arise before then.  Lenn Cal, DDS

## 2014-12-15 ENCOUNTER — Ambulatory Visit (HOSPITAL_COMMUNITY): Payer: 59

## 2014-12-15 ENCOUNTER — Ambulatory Visit: Payer: Medicaid Other

## 2014-12-16 ENCOUNTER — Ambulatory Visit
Admission: RE | Admit: 2014-12-16 | Discharge: 2014-12-16 | Disposition: A | Discharge status: Home / Self Care | Payer: Medicaid Other | Source: Ambulatory Visit | Attending: Radiation Oncology | Admitting: Radiation Oncology

## 2014-12-23 ENCOUNTER — Encounter (HOSPITAL_COMMUNITY): Payer: Self-pay

## 2014-12-23 ENCOUNTER — Other Ambulatory Visit: Payer: Self-pay | Admitting: Radiation Oncology

## 2014-12-23 ENCOUNTER — Ambulatory Visit (HOSPITAL_BASED_OUTPATIENT_CLINIC_OR_DEPARTMENT_OTHER)
Admission: RE | Admit: 2014-12-23 | Discharge: 2014-12-23 | Disposition: A | Discharge status: Home / Self Care | Payer: 59 | Source: Ambulatory Visit | Attending: Radiation Oncology | Admitting: Radiation Oncology

## 2014-12-23 ENCOUNTER — Ambulatory Visit (HOSPITAL_COMMUNITY)
Admission: RE | Admit: 2014-12-23 | Discharge: 2014-12-23 | Disposition: A | Discharge status: Home / Self Care | Payer: 59 | Source: Ambulatory Visit | Attending: Radiation Oncology | Admitting: Radiation Oncology

## 2014-12-23 DIAGNOSIS — Z923 Personal history of irradiation: Secondary | ICD-10-CM | POA: Diagnosis not present

## 2014-12-23 DIAGNOSIS — C01 Malignant neoplasm of base of tongue: Secondary | ICD-10-CM

## 2014-12-23 DIAGNOSIS — Z08 Encounter for follow-up examination after completed treatment for malignant neoplasm: Secondary | ICD-10-CM | POA: Insufficient documentation

## 2014-12-23 DIAGNOSIS — C760 Malignant neoplasm of head, face and neck: Secondary | ICD-10-CM | POA: Diagnosis present

## 2014-12-23 DIAGNOSIS — R590 Localized enlarged lymph nodes: Secondary | ICD-10-CM | POA: Insufficient documentation

## 2014-12-23 LAB — BUN AND CREATININE (CC13)
BUN: 9.4 mg/dL (ref 7.0–26.0)
Creatinine: 1 mg/dL (ref 0.7–1.3)
EGFR: 84 mL/min/{1.73_m2} — ABNORMAL LOW (ref >90)

## 2014-12-23 MED ORDER — IOHEXOL 300 MG/ML  SOLN
100.0000 mL | Freq: Once | INTRAMUSCULAR | Status: AC | PRN
  Administered 2014-12-23: 100 mL via INTRAVENOUS

## 2015-01-06 ENCOUNTER — Ambulatory Visit
Admission: RE | Admit: 2015-01-06 | Discharge: 2015-01-06 | Disposition: A | Discharge status: Home / Self Care | Payer: 59 | Source: Ambulatory Visit | Attending: Radiation Oncology | Admitting: Radiation Oncology

## 2015-01-06 ENCOUNTER — Encounter: Payer: Self-pay | Admitting: Radiation Oncology

## 2015-01-06 VITALS — BP 103/74 | HR 76 | Temp 98.8°F | Resp 16 | Ht 65.0 in | Wt 115.0 lb

## 2015-01-06 DIAGNOSIS — R635 Abnormal weight gain: Secondary | ICD-10-CM

## 2015-01-06 DIAGNOSIS — C01 Malignant neoplasm of base of tongue: Secondary | ICD-10-CM

## 2015-01-06 DIAGNOSIS — L989 Disorder of the skin and subcutaneous tissue, unspecified: Secondary | ICD-10-CM

## 2015-01-06 NOTE — Progress Notes (Signed)
Radiation Oncology         [phone]) [phone] ________________________________  Name: Angel [name] MRN: [id number]  Date: 04/04/2014  DOB: Oct 29, 1950  Follow-Up Visit Note  CC: [name], MD  [name], MD  Diagnosis and Prior Radiotherapy:        ICD-9-CM ICD-10-CM   1. Cancer of base of tongue 141.0 C01    T2N3M0 IVB squamous cell carcinoma, base of tongue  Indication for treatment:  Curative, following induction chemotherapy       Radiation treatment dates:   01/26/2014-03/18/2014  Site/dose: Base of tongue and bilateral neck / 70 Gy in 35 fractions to gross disease, 63 Gy in 35 fractions to high risk nodal echelons, and 56 Gy in 35 fractions to intermediate risk nodal echelons  Narrative:  The patient returns today for routine follow-up appointment with radiation oncology. The patient has recently gained weight. His major symptom of concern was a mole located on the back of the patients neck. Another symptom of concern is pain located in the back of the patient's neck. It occurred two times this week, to the extent that he could not make it out of bed. "It's on the right side, real stiff," he stated. In the past physical therapy "on [address] did not help alleviate this symptom. The patient projected a healthy mental status and was accompanied by his wife for today's radiation oncology appointment.  Pain issues, if any: neck stiffness, reports it comes and goes.  He said it lasts for a day or two.  He reports not being able to turn to the right.  He takes motrin as needed. Using a feeding tube?: no Weight changes, if any: weight is up 3 lbs from 09/23/2014. Swallowing issues, if any: reports food occasionally gets stuck.  He has to take his time eating. Smoking or chewing tobacco? Smokes 1 pack of cigarettes per day. Using fluoride trays daily? Does not have teeth Last ENT visit was on: no Other notable issues, if any: He has a scaly, raised bump on the back of his neck.  He  said it showed up after treatment ended.  He reports that it itches.  He also has hyperpigmetation on the left side of his neck.  Nutritional Status a) intake: Has a new set of dentures and is learning how to chew with them. He is eating mostly softer foods.  b) using a feeding tube?: PEG removed. No. c) weight changes, if any: down 2 lbs since his last visit.  Swallowing Status: swallowing without any complaints. Pt denies a sore throat. Dental (if applicable): When was last visit with dentistry 1 week ago.   When was last ENT visit? Has not had an appointment since his biopsy. When is next ENT visit? unknown  Other notable issues, if any: he reports a dry mouth - using biotene.  I reviewed the CT images of his neck from August 26th of 2016. There is no evidence of disease in the neck or upper lungs. There is a stable 6mm calcified left level III node.  ALLERGIES:  has No Known Allergies.  Meds:  Current outpatient prescriptions:  .  acetaminophen (TYLENOL) 325 MG tablet, Take 650 mg by mouth every 6 (six) hours as needed for mild pain or moderate pain., Disp: , Rfl:  .  antiseptic oral rinse (BIOTENE) LIQD, 15 mLs by Mouth Rinse route 2 (two) times daily., Disp: 1 Bottle, Rfl: 2 .  ibuprofen (ADVIL,MOTRIN) 200 MG tablet, Take 200 mg by  mouth every 6 (six) hours as needed., Disp: , Rfl:  .  Multiple Vitamin (MULTIVITAMIN) tablet, Take 1 tablet by mouth daily., Disp: , Rfl:  .  albuterol (PROVENTIL) (2.5 MG/3ML) 0.083% nebulizer solution, Take 3 mLs (2.5 mg total) by nebulization every 6 (six) hours as needed for wheezing or shortness of breath. (Patient not taking: Reported on 07/15/2014), Disp: 75 mL, Rfl: 12 .  fentaNYL (DURAGESIC - DOSED MCG/HR) 12 MCG/HR, Place 1 patch (12.5 mcg total) onto the skin every 3 (three) days. (Patient not taking: Reported on 06/21/2014), Disp: 10 patch, Rfl: 0 .  pantoprazole (PROTONIX) 40 MG tablet, Take 1 tablet (40 mg total) by mouth daily. (Patient not  taking: Reported on 07/15/2014), Disp: 90 tablet, Rfl: 2 .  prochlorperazine (COMPAZINE) 10 MG tablet, Take 1 tablet (10 mg total) by mouth every 6 (six) hours as needed for nausea or vomiting. (Patient not taking: Reported on 06/21/2014), Disp: 60 tablet, Rfl: 3 .  senna-docusate (SENOKOT-S) 8.6-50 MG per tablet, Take 2 tablets by mouth 2 (two) times daily. (Patient not taking: Reported on 06/21/2014), Disp: 120 tablet, Rfl: 0 .  temazepam (RESTORIL) 15 MG capsule, TAKE 1 CAPSULE BY MOUTH ONCE AT BEDTIME (Patient not taking: Reported on 09/23/2014), Disp: 30 capsule, Rfl: 0   Physical Findings:  Filed Vitals:   01/06/15 1623  BP: 103/74  Pulse: 76  Temp: 98.8 F (37.1 C)  Resp: 16     General: Alert and oriented, in no acute distress Upon examination of the mole, it was not observed to be cancerous. His PCP came to the same conclusion. SKIN: scaly raised lesion that is 1cm in maximum width over the posterior lower neck, to the right of midline. NECK: no palpable masses HENT: mucous membranes are moist, no lesions in the oropharynx HEART: heart is regular in rate and rhthm LUNGS: scattered wheezes throughout both lungs   Lab Findings:  CBC    Component Value Date/Time   WBC 5.0 03/17/2014 1615   WBC 3.8* 11/06/2013 0445   RBC 4.28 03/17/2014 1615   RBC 2.91* 11/06/2013 0445   HGB 13.3 03/17/2014 1615   HGB 9.4* 11/06/2013 0445   HCT 40.8 03/17/2014 1615   HCT 26.8* 11/06/2013 0445   PLT 289 03/17/2014 1615   PLT 156 11/06/2013 0445   MCV 95.3 03/17/2014 1615   MCV 92.1 11/06/2013 0445   MCH 31.1 03/17/2014 1615   MCH 32.3 11/06/2013 0445   MCHC 32.6 03/17/2014 1615   MCHC 35.1 11/06/2013 0445   RDW 15.1* 03/17/2014 1615   RDW 14.0 11/06/2013 0445   LYMPHSABS 0.4* 03/17/2014 1615   LYMPHSABS 1.1 11/06/2013 0445   MONOABS 0.5 03/17/2014 1615   MONOABS 0.7 11/06/2013 0445   EOSABS 0.6* 03/17/2014 1615   EOSABS 0.0 11/06/2013 0445   BASOSABS 0.1 03/17/2014 1615    BASOSABS 0.0 11/06/2013 0445   CMP     Component Value Date/Time   NA 138 03/17/2014 1616   NA 133* 11/06/2013 0445   K 4.3 03/17/2014 1616   K 3.5* 11/06/2013 0445   CL 98 11/06/2013 0445   CO2 27 03/17/2014 1616   CO2 21 11/06/2013 0445   GLUCOSE 92 03/17/2014 1616   GLUCOSE 99 11/06/2013 0445   BUN 9.4 12/23/2014 1057   BUN 11 11/06/2013 0445   CREATININE 1.0 12/23/2014 1057   CREATININE 0.63 11/06/2013 0445   CALCIUM 9.3 03/17/2014 1616   CALCIUM 7.0* 11/06/2013 0445   PROT 6.4 03/17/2014 1616  PROT 5.6* 11/03/2013 2220   ALBUMIN 3.9 03/17/2014 1616   ALBUMIN 2.5* 11/03/2013 2220   AST 12 03/17/2014 1616   AST 7 11/03/2013 2220   ALT 7 03/17/2014 1616   ALT 9 11/03/2013 2220   ALKPHOS 54 03/17/2014 1616   ALKPHOS 53 11/03/2013 2220   BILITOT 0.26 03/17/2014 1616   BILITOT 0.6 11/03/2013 2220   GFRNONAA >90 11/06/2013 0445   GFRAA >90 11/06/2013 0445    Lab Results  Component Value Date   TSH 1.488 07/14/2014    Radiographic Findings: No results found.   Impression/Plan: Pj Zehner is a 64 year old male presenting to clinic in regards to his T2N3M0 IVB squamous cell carcinoma, base of tongue. He is recovering well from his radiation therapy. The results from his most recent CT scan was reviewed and discussed. The patient understands the results. It is recommended that he discussed with his PCP to be screened annually for lung cancer due to smoking history.   1) Head and Neck Cancer Status: No evidence of disease.  2) Nutritional Status: He got some new dentures and is chewing soft foods at this moment. - weight: Down 2 lbs since March - PEG tube: Removed  3) Risk Factors: The patient has been educated about risk factors including alcohol and tobacco abuse; they understand that avoidance of alcohol and tobacco is important to prevent recurrences as well as other cancers. The patient continues to use smoking tobacco. The patient was counseled to stop using  tobacco: materials given to education him and provide counseling options; such as our head and neck cancer navigator.   4) Swallowing: No complaints  5) Dental: Edentulous.  6) Thyroid function: normal TSH   Lab Results  Component Value Date   TSH 1.488 07/14/2014    7) Social: Still smoking, community resources discussed.  8) Follow-up plan:  F/u in April 2017  9) OTHER: Mole on neck:referred to dermatology for further examination. The importance of completing annual lab work to monitor thyroid function was emphasized. He and his wife were invited to partake in the [facility] program.The importance and health benefits in relation to his recovery towards quitting smoking was emphasized. Nicotine patches and a support group were healthy methods offered that can be provided upon patient request. To address the pain and stiffness in the patient's neck (right side, back of the neck) he is declining referral to physical therapist. All vocalized questions and concerns have been addressed. If the patient develops any further questions or concerns in regards to his treatment and recovery, he has been encouraged to contact Dr. Isidore Moos, MD. The patient is aware of his follow-up appointment with Dr. Redmond Baseman to take place in three months, and of his follow-up appointment with radiation oncology to take place in seven months.   This document serves as a record of services personally performed by Eppie Gibson, MD. It was created on her behalf by Lenn Cal, a trained medical scribe. The creation of this record is based on the scribe's personal observations and the provider's statements to them. This document has been checked and approved by the attending provider.    _____________________________________   Eppie Gibson, MD

## 2015-01-06 NOTE — Progress Notes (Signed)
Pain issues, if any: neck stiffness, reports it comes and goes.  He said it lasts for a day or two.  He reports not being able to turn to the right.  He takes motrin as needed. Using a feeding tube?: no Weight changes, if any: weight is up 3 lbs from 09/23/14. Swallowing issues, if any: reports food occasionally gets stuck.  He has to take his time eating. Smoking or chewing tobacco? Smokes 1 pack of cigarettes per day. Using fluoride trays daily? Does not have teeth Last ENT visit was on: no Other notable issues, if any: He has a scaly, raised bump on the back of his neck.  He said it showed up after treatment ended.  He reports that it itches.  He also has hyperpigmetation on the left side of his neck.  BP 103/74 mmHg  Pulse 76  Temp(Src) 98.8 F (37.1 C) (Oral)  Resp 16  Ht 5\' 5"  (1.651 m)  Wt 115 lb (52.164 kg)  BMI 19.14 kg/m2  SpO2 95%   Wt Readings from Last 3 Encounters:  01/06/15 115 lb (52.164 kg)  09/23/14 112 lb 6.4 oz (50.984 kg)  07/15/14 114 lb (51.71 kg)

## 2015-01-09 ENCOUNTER — Telehealth: Payer: Self-pay | Admitting: *Deleted

## 2015-01-09 NOTE — Telephone Encounter (Signed)
  Oncology Nurse Navigator Documentation   Navigator Encounter Type: Telephone (01/09/15 1454)         Interventions: Coordination of Care (01/09/15 1454)     Per Dr. Pearlie Oyster guidance, called [facility] ENT to arrange follow-up visit.  Spoke with Janett Billow, requested that patient be contacted and appt be arranged to see Dr. Redmond Baseman in 3 months.   She verbalized understanding.  Gayleen Orem, RN, BSN, [place] [facility] [place] [phone]            Time Spent with Patient: 15 (01/09/15 1454)

## 2015-01-16 ENCOUNTER — Telehealth: Payer: Self-pay | Admitting: *Deleted

## 2015-01-16 NOTE — Telephone Encounter (Signed)
CALLED PATIENT TO INFORM OF LAB AND FU FOR 07-2015, SPOKE WITH PATIENT'S WIFE- BETSY AND SHE IS AWARE OF THESE APPTS.

## 2015-01-17 ENCOUNTER — Telehealth: Payer: Self-pay | Admitting: *Deleted

## 2015-01-17 NOTE — Telephone Encounter (Signed)
SPOKE WITH PATIENT AND HE OPTED TO CANCEL THE APPT. WITH [facility], HE STATED THAT HE WENT TO HIS PC AND HE IS BETTER

## 2015-01-17 NOTE — Telephone Encounter (Signed)
CALLED PATIENT TO INFORM OF CONSULT WITH DR. Manuela Schwartz STEINHELFER ON 03-14-15- ARRIVAL TIME - 11:50 AM, LVM FOR A RETURN CALL

## 2015-02-27 ENCOUNTER — Encounter (HOSPITAL_COMMUNITY): Payer: Self-pay | Admitting: Emergency Medicine

## 2015-02-27 ENCOUNTER — Emergency Department (HOSPITAL_COMMUNITY)
Admission: EM | Admit: 2015-02-27 | Discharge: 2015-02-28 | Disposition: A | Discharge status: Home / Self Care | Payer: 59 | Attending: Emergency Medicine | Admitting: Emergency Medicine

## 2015-02-27 ENCOUNTER — Emergency Department (HOSPITAL_COMMUNITY): Payer: 59

## 2015-02-27 DIAGNOSIS — K5903 Drug induced constipation: Secondary | ICD-10-CM | POA: Diagnosis not present

## 2015-02-27 DIAGNOSIS — M79622 Pain in left upper arm: Secondary | ICD-10-CM | POA: Diagnosis present

## 2015-02-27 DIAGNOSIS — M62838 Other muscle spasm: Secondary | ICD-10-CM | POA: Diagnosis not present

## 2015-02-27 DIAGNOSIS — G4700 Insomnia, unspecified: Secondary | ICD-10-CM | POA: Insufficient documentation

## 2015-02-27 DIAGNOSIS — Z8619 Personal history of other infectious and parasitic diseases: Secondary | ICD-10-CM | POA: Insufficient documentation

## 2015-02-27 DIAGNOSIS — Z87891 Personal history of nicotine dependence: Secondary | ICD-10-CM | POA: Insufficient documentation

## 2015-02-27 DIAGNOSIS — M542 Cervicalgia: Secondary | ICD-10-CM | POA: Diagnosis not present

## 2015-02-27 DIAGNOSIS — Z79899 Other long term (current) drug therapy: Secondary | ICD-10-CM | POA: Insufficient documentation

## 2015-02-27 DIAGNOSIS — Z8679 Personal history of other diseases of the circulatory system: Secondary | ICD-10-CM | POA: Diagnosis not present

## 2015-02-27 DIAGNOSIS — Z8639 Personal history of other endocrine, nutritional and metabolic disease: Secondary | ICD-10-CM | POA: Insufficient documentation

## 2015-02-27 DIAGNOSIS — Z85810 Personal history of malignant neoplasm of tongue: Secondary | ICD-10-CM | POA: Diagnosis not present

## 2015-02-27 MED ORDER — DIAZEPAM 2 MG PO TABS
2.0000 mg | ORAL_TABLET | Freq: Once | ORAL | Status: AC
  Administered 2015-02-28: 2 mg via ORAL
  Filled 2015-02-27: qty 1

## 2015-02-27 NOTE — ED Notes (Signed)
Pt states he has pain on his right side and back, left axilla pain, and neck pain  Pt states he has had this pain since Friday and it has gotten worse  Pt states it feels like a pulled muscle but much worse  Pt is splinting with cough

## 2015-02-27 NOTE — ED Provider Notes (Signed)
CSN: [id number]     Arrival date & time 02/27/15  2233 History   By signing my name below, I, Forrestine Him, attest that this documentation has been prepared under the direction and in the presence of Leo Grosser, MD.  Electronically Signed: Forrestine Him, ED Scribe. 02/27/2015. 11:53 PM.   Chief Complaint  Patient presents with  . Muscle Pain    The history is provided by the patient. No language interpreter was used.    HPI Comments: TRAVARUS TRUDO is a 64 y.o. male who presents to the Emergency Department complaining of constant, ongoing, worsening generalized myalgias x 2-3 days. Pt states pain is particularly located in his R side, L axilla, back, and neck. Pain is made worse with certain movements and positioning. No alleviating factors at this time. Prescribed Toradol and muscle relaxants attempted at home without any improvement. OTC Ibuprofen also attempted without any relief. No recent fever, chills, nausea, vomiting, or abdominal pain. No numbness, loss of sensation, or weakness. PSHx includes back surgery and neck surgery. Mr. Arcia plans to establish with a pain clinic in the near future.   PCP: [name], MD    Past Medical History  Diagnosis Date  . Constipation due to pain medication   . Current smoker   . Insomnia 11/12/2013  . Hypomagnesemia 11/12/2013  . Thrush of mouth and esophagus ([place]) 02/21/2014  . S/P radiation therapy 01/26/2014-03/18/2014    base of tongue, bilateral neck/ 70 Gy/35 fx,   . Lymphedema 05/05/2014  . Malignant neoplasm of base of tongue ([facility])     left neck lymph and Left BOT cancer  . Metastasis to lymph nodes ([place]) 10/06/2013  . 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 Testicle surgery      as a infant  . Appendectomy    . Mandible surgery      from [place]  . Back surgery      Lumbar  . Tonsillectomy    . Panendoscopy N/A 10/04/2013    Procedure: Direct  Laryngoscopy  WITH BIOPSY;  Surgeon: Melida Quitter, MD;  Location: [facility];  Service: ENT;  Laterality: N/A;  . Esophagoscopy  10/04/2013    Procedure: ESOPHAGOSCOPY;  Surgeon: Melida Quitter, MD;  Location: [place];  Service: ENT;;  . Multiple extractions with alveoloplasty N/A 10/08/2013    Procedure: extraction of tooth #'s 2,3,4,5,6,11,12,13,14,15,18,19,20,21,22,23,24,25,26, 27,28, 29, 31 with alveoloplasty ;  Surgeon: Lenn Cal, DDS;  Location: [facility];  Service: Oral Surgery;  Laterality: N/A;   Family History  Problem Relation Age of Onset  . Cancer Mother     Lung  . Cancer Father     ? Lung   Social History  Substance Use Topics  . Smoking status: Former Smoker -- 2.00 packs/day for 45 years    Types: Cigarettes  . Smokeless tobacco: Never Used     Comment: reports using 2 cigarettes a day  . Alcohol Use: No     Comment: "Heavy" Alcohol Use in the Past    Review of Systems  Constitutional: Negative for fever and chills.  Respiratory: Negative for cough and shortness of breath.   Cardiovascular: Negative for chest pain.  Gastrointestinal: Negative for nausea, vomiting and abdominal pain.  Musculoskeletal: Positive for back pain, arthralgias and neck pain.  Skin: Negative for rash.  Neurological: Negative for headaches.  Psychiatric/Behavioral: Negative for confusion.  All other  systems reviewed and are negative.     Allergies  Review of patient's allergies indicates no known allergies.  Home Medications   Prior to Admission medications   Medication Sig Start Date End Date Taking? Authorizing Provider  temazepam (RESTORIL) 15 MG capsule TAKE 1 CAPSULE BY MOUTH ONCE AT BEDTIME Patient taking differently: TAKE 15 MG BY MOUTH AT BEDTIME AS NEEDED FOR SLEEP 08/30/14  Yes [name], MD  albuterol (PROVENTIL) (2.5 MG/3ML) 0.083% nebulizer solution Take 3 mLs (2.5 mg total) by nebulization every 6 (six) hours as needed for wheezing or shortness of breath. Patient not taking:  Reported on 07/15/2014 11/07/13   Kinnie Feil, MD  antiseptic oral rinse (BIOTENE) LIQD 15 mLs by Mouth Rinse route 2 (two) times daily. Patient not taking: Reported on 02/27/2015 11/01/13   Venetia Maxon Rama, MD  fentaNYL (DURAGESIC - DOSED MCG/HR) 12 MCG/HR Place 1 patch (12.5 mcg total) onto the skin every 3 (three) days. Patient not taking: Reported on 06/21/2014 03/31/14   [name], MD  pantoprazole (PROTONIX) 40 MG tablet Take 1 tablet (40 mg total) by mouth daily. Patient not taking: Reported on 07/15/2014 02/21/14   [name], MD  prochlorperazine (COMPAZINE) 10 MG tablet Take 1 tablet (10 mg total) by mouth every 6 (six) hours as needed for nausea or vomiting. Patient not taking: Reported on 06/21/2014 01/17/14   Eppie Gibson, MD  senna-docusate (SENOKOT-S) 8.6-50 MG per tablet Take 2 tablets by mouth 2 (two) times daily. Patient not taking: Reported on 06/21/2014 11/01/13   Venetia Maxon Rama, MD   Triage Vitals: BP 163/83 mmHg  Pulse 85  Temp(Src) 97.7 F (36.5 C) (Oral)  Resp 18  Ht 5\' 5"  (1.651 m)  Wt 114 lb (51.71 kg)  BMI 18.97 kg/m2  SpO2 99%   Physical Exam  Constitutional: He is oriented to person, place, and time. He appears well-developed and well-nourished.  HENT:  Head: Normocephalic and atraumatic.  Eyes: EOM are normal.  Neck: Normal range of motion.  Bilateral paraspinal neck muscles  Cardiovascular: Normal rate, regular rhythm, normal heart sounds and intact distal pulses.   Pulmonary/Chest: Effort normal and breath sounds normal. No respiratory distress.  Abdominal: Soft. He exhibits no distension. There is no tenderness.  Musculoskeletal: Normal range of motion.  Neurological: He is alert and oriented to person, place, and time.  Full strength to bilateral upper extremities   Skin: Skin is warm and dry.  Psychiatric: He has a normal mood and affect. Judgment normal.  Nursing note and vitals reviewed.   ED Course  Procedures (including critical care  time) DIAGNOSTIC STUDIES: Oxygen Saturation is 99% on RA, Normal by my interpretation.    COORDINATION OF CARE: 11:47 PM- Will give Valium. Will order CXR. Discussed treatment plan with pt at bedside and pt agreed to plan.     Labs Review Labs Reviewed - No data to display  Imaging Review Dg Chest Port 1 View  02/27/2015  CLINICAL DATA:  Mid chest pain and shortness of Breath EXAM: PORTABLE CHEST - 1 VIEW COMPARISON:  None FINDINGS: The heart size and mediastinal contours are within normal limits. Both lungs are clear. The visualized skeletal structures are unremarkable. IMPRESSION: No active disease. Electronically Signed   By: Inez Catalina M.D.   On: 02/27/2015 23:39   I have personally reviewed and evaluated these images and lab results as part of my medical decision-making.   EKG Interpretation None      MDM   Final  diagnoses:  Muscle spasm of left shoulder  Muscle spasm of right shoulder    64 year old male presents with bilateral shoulder muscle continuous spasm. He has had neck surgery on the soft tissues remotely. He is continually shrugging with point tenderness over bilateral trapezius muscles. No neurologic deficits and has full strength of bilateral upper extremities. Provided Valium for short-term relief of symptoms in the ED and I explained why a long-term protrusion of this medication is more detrimental than the benefit. I recommended NSAIDs, deep tissue massage of the area, exercise, and heat therapy as needed. He is pending a pain management consultation for this chronic ongoing pain. Initially had a cough and left-sided chest pain 3 days ago which has resolved. No evidence of pneumothorax or other complication on plain film. Plan to follow up with PCP as needed and return precautions discussed for worsening or new concerning symptoms.   I personally performed the services described in this documentation, which was scribed in my presence. The recorded information has  been reviewed and is accurate.   Leo Grosser, MD 02/28/15 [phone]

## 2015-02-28 ENCOUNTER — Telehealth: Payer: Self-pay | Admitting: *Deleted

## 2015-02-28 MED ORDER — IBUPROFEN 800 MG PO TABS: 800.0000 mg | ORAL_TABLET | Freq: Three times a day (TID) | ORAL | Status: DC | PRN

## 2015-02-28 NOTE — Discharge Instructions (Signed)
Heat Therapy °Heat therapy can help ease sore, stiff, injured, and tight muscles and joints. Heat relaxes your muscles, which may help ease your pain.  °RISKS AND COMPLICATIONS °If you have any of the following conditions, do not use heat therapy unless your health care provider has approved: °· Poor circulation. °· Healing wounds or scarred skin in the area being treated. °· Diabetes, heart disease, or high blood pressure. °· Not being able to feel (numbness) the area being treated. °· Unusual swelling of the area being treated. °· Active infections. °· Blood clots. °· Cancer. °· Inability to communicate pain. This may include young children and people who have problems with their brain function (dementia). °· Pregnancy. °Heat therapy should only be used on old, pre-existing, or long-lasting (chronic) injuries. Do not use heat therapy on new injuries unless directed by your health care provider. °HOW TO USE HEAT THERAPY °There are several different kinds of heat therapy, including: °· Moist heat pack. °· Warm water bath. °· Hot water bottle. °· Electric heating pad. °· Heated gel pack. °· Heated wrap. °· Electric heating pad. °Use the heat therapy method suggested by your health care provider. Follow your health care provider's instructions on when and how to use heat therapy. °GENERAL HEAT THERAPY RECOMMENDATIONS °· Do not sleep while using heat therapy. Only use heat therapy while you are awake. °· Your skin may turn pink while using heat therapy. Do not use heat therapy if your skin turns red. °· Do not use heat therapy if you have new pain. °· High heat or long exposure to heat can cause burns. Be careful when using heat therapy to avoid burning your skin. °· Do not use heat therapy on areas of your skin that are already irritated, such as with a rash or sunburn. °SEEK MEDICAL CARE IF: °· You have blisters, redness, swelling, or numbness. °· You have new pain. °· Your pain is worse. °MAKE SURE  YOU: °· Understand these instructions. °· Will watch your condition. °· Will get help right away if you are not doing well or get worse. °  °This information is not intended to replace advice given to you by your health care provider. Make sure you discuss any questions you have with your health care provider. °  °Document Released: 07/08/2011 Document Revised: 05/06/2014 Document Reviewed: 06/08/2013 °Elsevier Interactive Patient Education ©2016 Elsevier Inc. ° °Muscle Cramps and Spasms °Muscle cramps and spasms occur when a muscle or muscles tighten and you have no control over this tightening (involuntary muscle contraction). They are a common problem and can develop in any muscle. The most common place is in the calf muscles of the leg. Both muscle cramps and muscle spasms are involuntary muscle contractions, but they also have differences:  °· Muscle cramps are sporadic and painful. They may last a few seconds to a quarter of an hour. Muscle cramps are often more forceful and last longer than muscle spasms. °· Muscle spasms may or may not be painful. They may also last just a few seconds or much longer. °CAUSES  °It is uncommon for cramps or spasms to be due to a serious underlying problem. In many cases, the cause of cramps or spasms is unknown. Some common causes are:  °· Overexertion.   °· Overuse from repetitive motions (doing the same thing over and over).   °· Remaining in a certain position for a long period of time.   °· Improper preparation, form, or technique while performing a sport or activity.   °·   Dehydration.   °· Injury.   °· Side effects of some medicines.   °· Abnormally low levels of the salts and ions in your blood (electrolytes), especially potassium and calcium. This could happen if you are taking water pills (diuretics) or you are pregnant.   °Some underlying medical problems can make it more likely to develop cramps or spasms. These include, but are not limited to:  °· Diabetes.    °· Parkinson disease.   °· Hormone disorders, such as thyroid problems.   °· Alcohol abuse.   °· Diseases specific to muscles, joints, and bones.   °· Blood vessel disease where not enough blood is getting to the muscles.   °HOME CARE INSTRUCTIONS  °· Stay well hydrated. Drink enough water and fluids to keep your urine clear or pale yellow. °· It may be helpful to massage, stretch, and relax the affected muscle. °· For tight or tense muscles, use a warm towel, heating pad, or hot shower water directed to the affected area. °· If you are sore or have pain after a cramp or spasm, applying ice to the affected area may relieve discomfort. °¨ Put ice in a plastic bag. °¨ Place a towel between your skin and the bag. °¨ Leave the ice on for 15-20 minutes, 03-04 times a day. °· Medicines used to treat a known cause of cramps or spasms may help reduce their frequency or severity. Only take over-the-counter or prescription medicines as directed by your caregiver. °SEEK MEDICAL CARE IF:  °Your cramps or spasms get more severe, more frequent, or do not improve over time.  °MAKE SURE YOU:  °· Understand these instructions. °· Will watch your condition. °· Will get help right away if you are not doing well or get worse. °  °This information is not intended to replace advice given to you by your health care provider. Make sure you discuss any questions you have with your health care provider. °  °Document Released: 10/05/2001 Document Revised: 08/10/2012 Document Reviewed: 04/01/2012 °Elsevier Interactive Patient Education ©2016 Elsevier Inc. ° °

## 2015-03-03 NOTE — Telephone Encounter (Signed)
  Oncology Nurse Navigator Documentation   Navigator Encounter Type: Telephone (02/28/15 1455)         Interventions: Coordination of Care (02/28/15 1455)    Spoke with patient wife in follow-up to this morning's VMM indicating he visited ER last HS for muscle spasms. ER physician's recommendation to see Dr. Alvy Bimler.  Per Dr. Alvy Bimler, he should pursue f/u with PCP.  Wife indicated patient also has been planning to schedule appt with pain mgt clinic to address ongoing pain issues.  I encouraged her to move forward with that appt as well.  She verbalized understanding.  Gayleen Orem, RN, BSN, [place] [facility] [place] [phone]         Time Spent with Patient: 15 (02/28/15 1455)

## 2015-06-12 ENCOUNTER — Encounter (HOSPITAL_COMMUNITY): Payer: Self-pay | Admitting: Dentistry

## 2015-06-12 IMAGING — CT CT ABD-PELV W/ CM
2 of 6 series · 17 of 46 positions shown, 19 images · IV contrast (OMNIPAQUE)
Comparison: PET-CT 10/15/2013

CLINICAL DATA: Leukocytosis, fever, diarrhea, past history tongue
cancer May 2013 with ongoing chemotherapy, smoking history,
appendectomy

EXAM:
CT ABDOMEN AND PELVIS WITH CONTRAST
TECHNIQUE: Multidetector CT imaging of the abdomen and pelvis was performed
using the standard protocol following bolus administration of
intravenous contrast.
CONTRAST:  80mL OMNIPAQUE IOHEXOL 300 MG/ML  SOLN

[Series 2: rtn a/p with · axial · 0.72mm/px · z∈[-682,-318]mm · 14 of 83 slices shown, 16 images]
[im 5/83  soft-tissue]
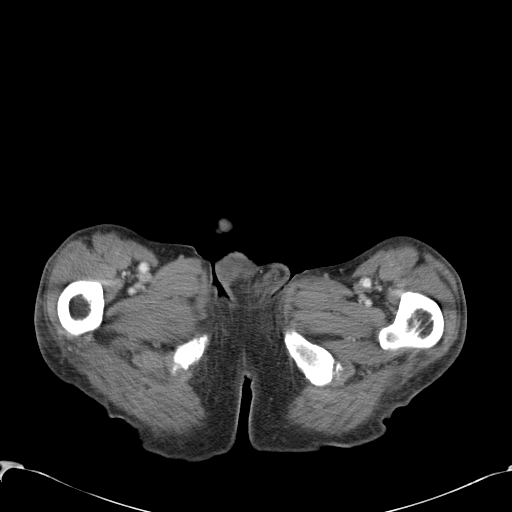
[im 5/83  bone]
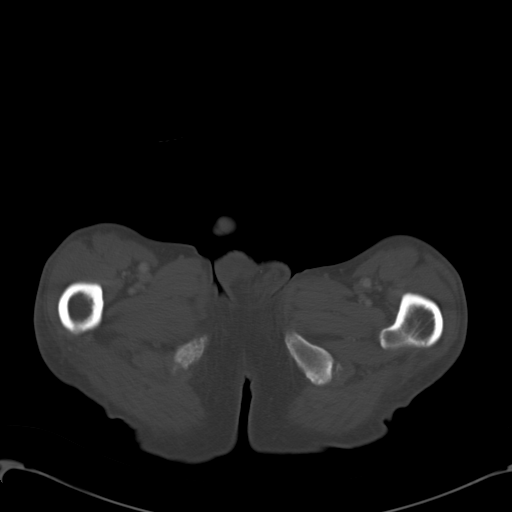
[im 10/83  soft-tissue]
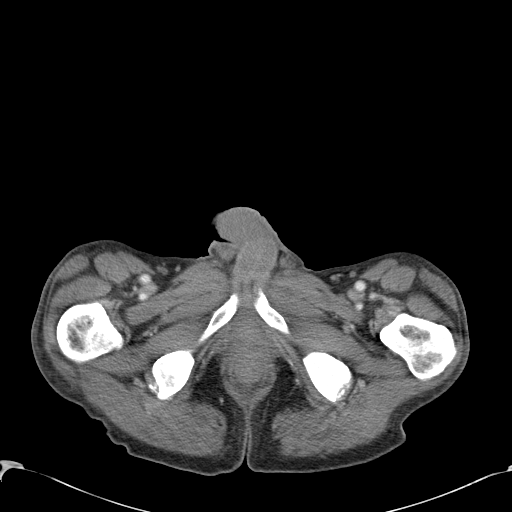
[im 15/83  soft-tissue]
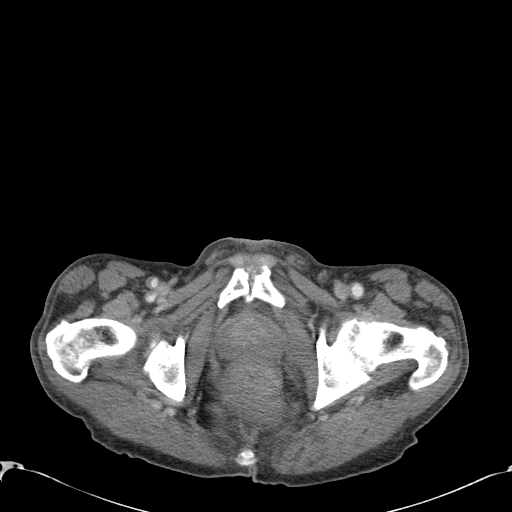
[im 25/83  soft-tissue]
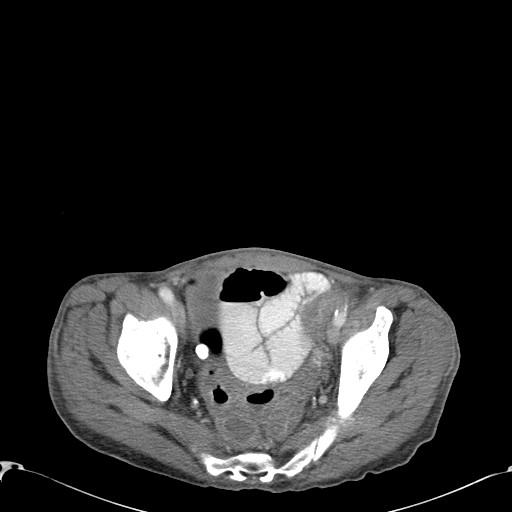
[im 29/83  soft-tissue]
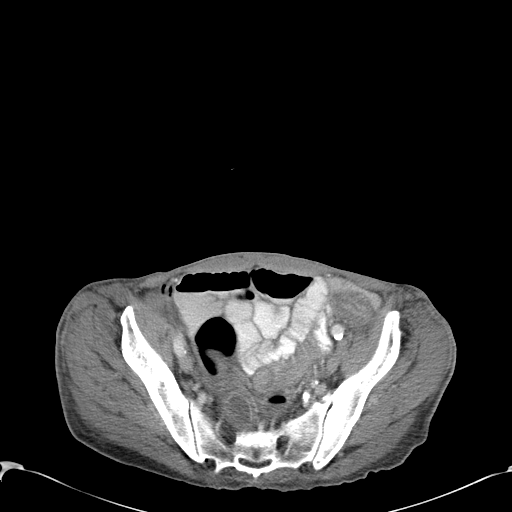
[im 34/83  soft-tissue]
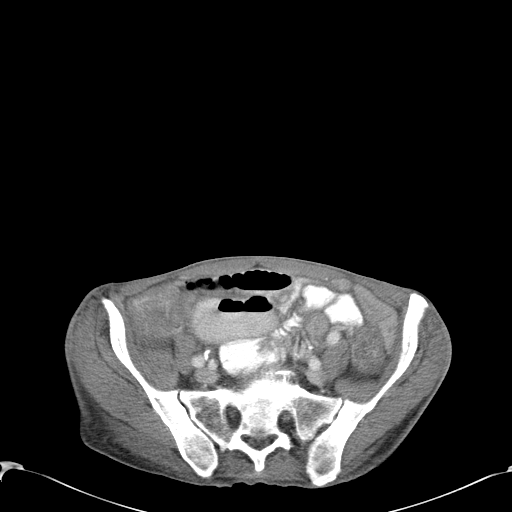
[im 39/83  soft-tissue]
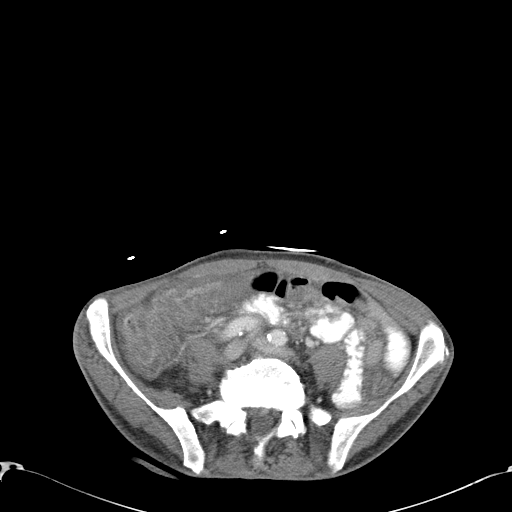
[im 44/83  soft-tissue]
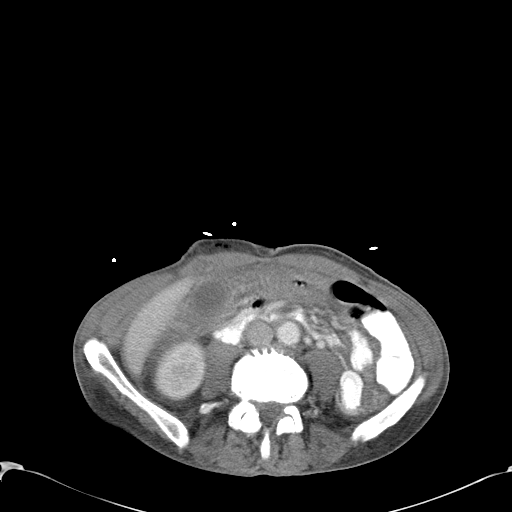
[im 49/83  soft-tissue]
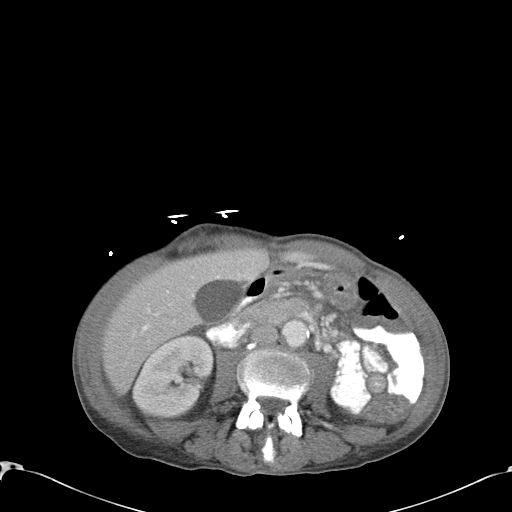
[im 49/83  bone]
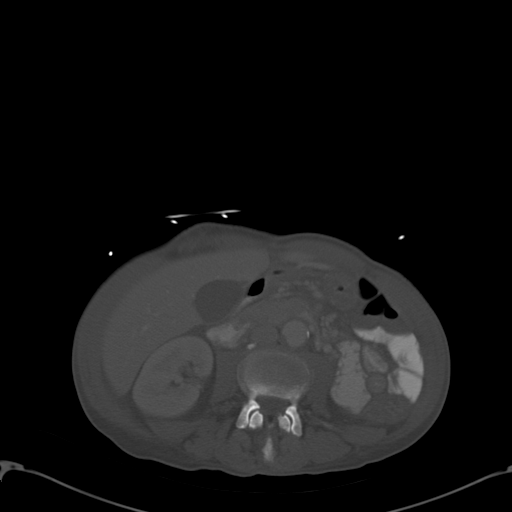
[im 54/83  soft-tissue]
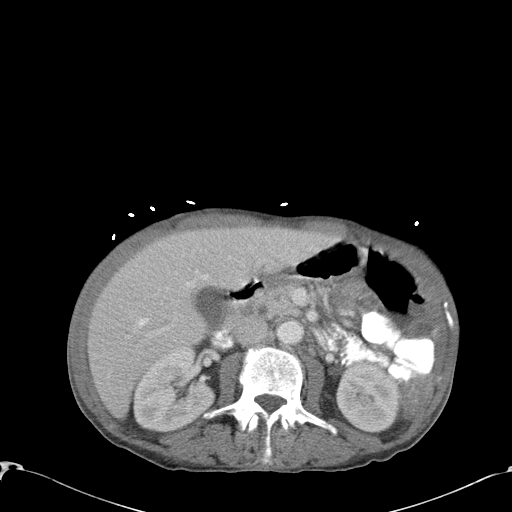
[im 63/83  soft-tissue]
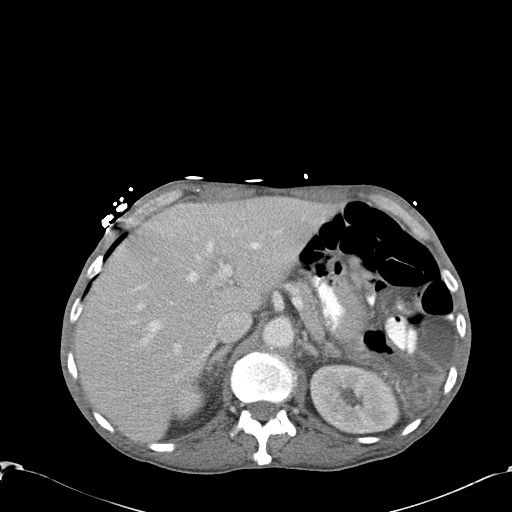
[im 68/83  soft-tissue]
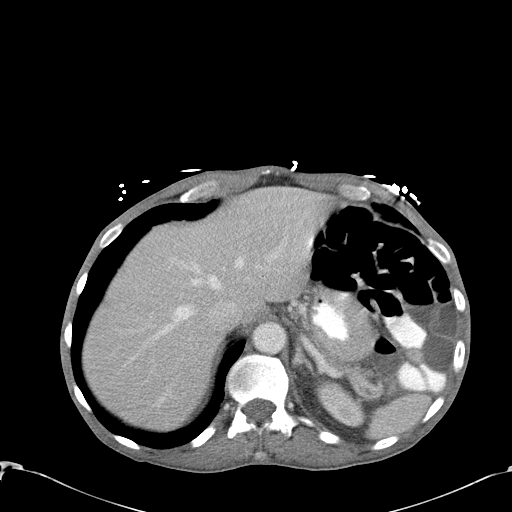
[im 73/83  soft-tissue]
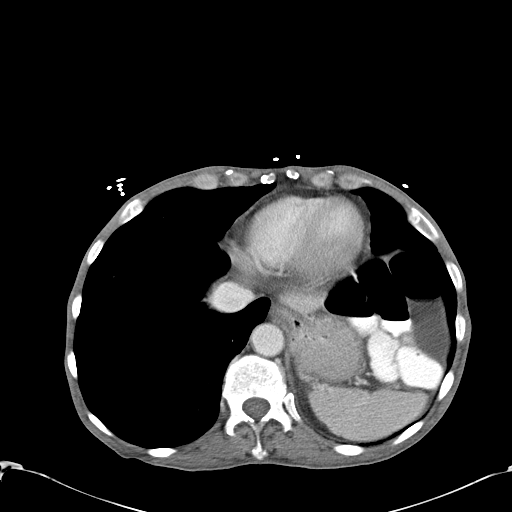
[im 78/83  soft-tissue]
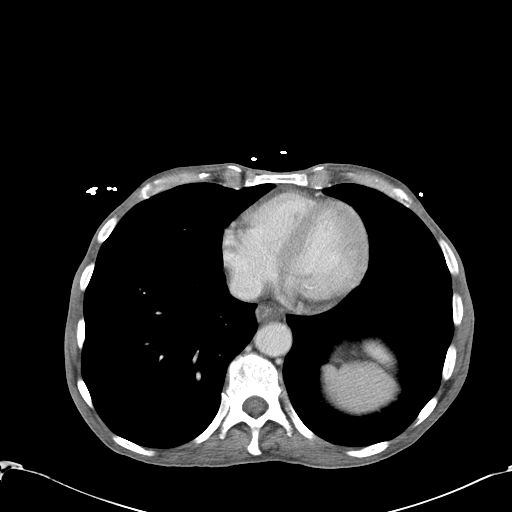

[Series 602: <mpr thick range> · coronal · 0.81mm/px · 3 of 75 slices shown]
[im 19/75  soft-tissue]
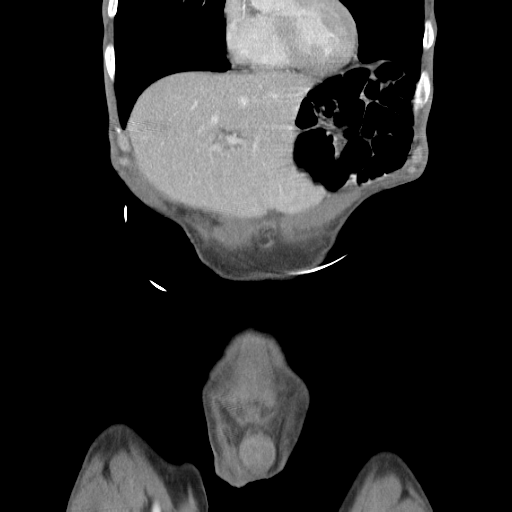
[im 38/75  soft-tissue]
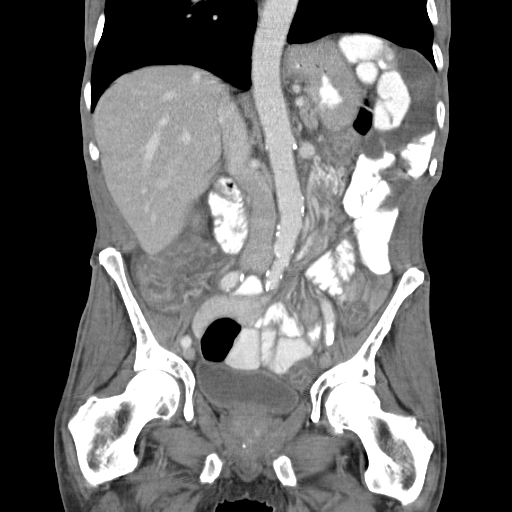
[im 56/75  soft-tissue]
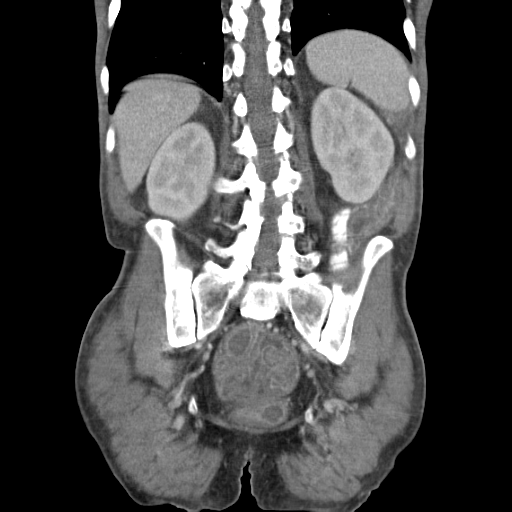

[17 of 46 positions shown; findings below may reference images not displayed]

FINDINGS: Port-A-Cath tip RIGHT atrium.

Elevation of LEFT diaphragm.

Lung bases clear.

Liver, spleen, pancreas, kidneys, and adrenal glands normal
appearance.

Gastric wall appears thickened question artifact from
underdistention versus true wall thickening.

Diffuse edema and wall thickening of colon throughout its length
compatible with colitis.

Small bowel loops unremarkable.

Scattered atherosclerotic calcification without aneurysm.

Unremarkable bladder and ureters.

Question tiny umbilical hernia containing fat.

Tiny foci of subcutaneous gas anterior abdominal wall at upper
pelvis question medication injection site.

No intra-abdominal mass, adenopathy, free fluid or free air.

Extensive degenerative disc disease changes and sclerosis at L4-L5
and L5-S1.

No definite acute osseous findings.
IMPRESSION: Diffuse colonic wall thickening compatible with colitis.

Differential diagnosis includes infection or inflammatory bowel
disease, with ischemia considered unlikely due to length of
involvement.

Unable to exclude gastric wall thickening though this could be an
artifact from underdistention.

Questionable tiny umbilical hernia containing fat.

## 2015-08-08 NOTE — Progress Notes (Signed)
Angel [name] presents for follow up of radiation completed 03/18/2014 to his base of tongue and bilateral neck.     Pain issues, if any: He reports chronic pain to his upper shoulders and neck. Using a feeding tube?: No Weight changes, if any:  Wt Readings from Last 3 Encounters:  08/11/15 110 lb 14.4 oz (50.304 kg)  02/27/15 114 lb (51.71 kg)  01/06/15 115 lb (52.164 kg)   Swallowing issues, if any: He denies any difficulty swallowing. He reports a decreased appetite. Smoking or chewing tobacco? He is smoking currently, and states it is fewer than 10 a day.  Using fluoride trays daily? He has dentures Last ENT visit was on: ? Dr. Redmond Baseman 09/23/13, he has no future appointment scheduled per their office.  Other notable issues, if any: He has no concerns at this time.   BP 139/70 mmHg  Pulse 78  Temp(Src) 98 F (36.7 C)  Ht 5\' 5"  (1.651 m)  Wt 110 lb 14.4 oz (50.304 kg)  BMI 18.45 kg/m2  SpO2 100%

## 2015-08-11 ENCOUNTER — Ambulatory Visit
Admission: RE | Admit: 2015-08-11 | Discharge: 2015-08-11 | Disposition: A | Discharge status: Home / Self Care | Payer: 59 | Source: Ambulatory Visit | Attending: Radiation Oncology | Admitting: Radiation Oncology

## 2015-08-11 ENCOUNTER — Encounter: Payer: Self-pay | Admitting: Radiation Oncology

## 2015-08-11 VITALS — BP 139/70 | HR 78 | Temp 98.0°F | Ht 65.0 in | Wt 110.9 lb

## 2015-08-11 DIAGNOSIS — R634 Abnormal weight loss: Secondary | ICD-10-CM

## 2015-08-11 DIAGNOSIS — R635 Abnormal weight gain: Secondary | ICD-10-CM

## 2015-08-11 DIAGNOSIS — C01 Malignant neoplasm of base of tongue: Secondary | ICD-10-CM

## 2015-08-11 LAB — TSH: TSH: 3.995 m(IU)/L (ref 0.320–4.118)

## 2015-08-11 NOTE — Progress Notes (Signed)
Radiation Oncology         [phone] ________________________________  Name: Angel [name] MRN: [id number]  Date: 04/04/2014  DOB: 1950/11/13  Follow-Up Visit Note   Diagnosis and Prior Radiotherapy:      ICD 10 code C01;  T2N3M0 IVB squamous cell carcinoma, base of tongue  Indication for treatment:  Curative, following induction chemotherapy       Radiation treatment dates:   01/26/2014-03/18/2014  Site/dose: Base of tongue and bilateral neck / 70 Gy in 35 fractions to gross disease, 63 Gy in 35 fractions to high risk nodal echelons, and 56 Gy in 35 fractions to intermediate risk nodal echelons  Narrative:  Angel [name] presents for follow up of radiation completed 03/18/2014 to his base of tongue and bilateral neck.   Pain issues, if any: He reports chronic pain to his upper shoulders and neck. Using a feeding tube?: No Weight changes, if any:  Wt Readings from Last 3 Encounters:  08/11/15 110 lb 14.4 oz (50.304 kg)  02/27/15 114 lb (51.71 kg)  01/06/15 115 lb (52.164 kg)   Swallowing issues, if any: He denies any difficulty swallowing. He reports a decreased appetite but eating the same amount. Smoking or chewing tobacco? He is smoking currently, and states it is fewer than 10 a day.  Using fluoride trays daily? He has dentures Last ENT visit was on: ? Dr. Redmond Baseman 09/23/13, he has no future appointment scheduled per their office.  Other notable issues, if any: He has no concerns at this time.    Patient states he visited PT but was not satisfied and discontinued. Patient states he has had a colonoscopy when he was getting his PSA checked (not sure if this is accurate).   ALLERGIES:  has No Known Allergies.  Meds:  Current outpatient prescriptions:  .  antiseptic oral rinse (BIOTENE) LIQD, 15 mLs by Mouth Rinse route 2 (two) times daily., Disp: 1 Bottle, Rfl: 2 .  ibuprofen (ADVIL,MOTRIN) 800 MG tablet, Take 1 tablet (800 mg total) by mouth every 8 (eight) hours as  needed for cramping., Disp: 21 tablet, Rfl: 0 .  methocarbamol (ROBAXIN) 500 MG tablet, Take 500 mg by mouth every 6 (six) hours as needed for muscle spasms., Disp: , Rfl:  .  temazepam (RESTORIL) 15 MG capsule, TAKE 1 CAPSULE BY MOUTH ONCE AT BEDTIME (Patient taking differently: TAKE 15 MG BY MOUTH AT BEDTIME AS NEEDED FOR SLEEP), Disp: 30 capsule, Rfl: 0 .  traMADol (ULTRAM) 50 MG tablet, Take 50 mg by mouth every 6 (six) hours as needed., Disp: , Rfl:  .  albuterol (PROVENTIL) (2.5 MG/3ML) 0.083% nebulizer solution, Take 3 mLs (2.5 mg total) by nebulization every 6 (six) hours as needed for wheezing or shortness of breath. (Patient not taking: Reported on 07/15/2014), Disp: 75 mL, Rfl: 12   Physical Findings:  Filed Vitals:   08/11/15 1008  BP: 139/70  Pulse: 78  Temp: 98 F (36.7 C)     General: Alert and oriented, in no acute distress NECK: no palpable masses in supraclavicular,cervical. Post treatment fibrosis in the left neck HEENT: mucous membranes are moist, no lesions in the oropharynx. No thrush HEART: heart is regular in rate and rhthm LUNGS: Chest is clear to auscultation   Lab Findings:  CBC    Component Value Date/Time   WBC 5.0 03/17/2014 1615   WBC 3.8* 11/06/2013 0445   RBC 4.28 03/17/2014 1615   RBC 2.91* 11/06/2013 0445   HGB 13.3 03/17/2014 1615  HGB 9.4* 11/06/2013 0445   HCT 40.8 03/17/2014 1615   HCT 26.8* 11/06/2013 0445   PLT 289 03/17/2014 1615   PLT 156 11/06/2013 0445   MCV 95.3 03/17/2014 1615   MCV 92.1 11/06/2013 0445   MCH 31.1 03/17/2014 1615   MCH 32.3 11/06/2013 0445   MCHC 32.6 03/17/2014 1615   MCHC 35.1 11/06/2013 0445   RDW 15.1* 03/17/2014 1615   RDW 14.0 11/06/2013 0445   LYMPHSABS 0.4* 03/17/2014 1615   LYMPHSABS 1.1 11/06/2013 0445   MONOABS 0.5 03/17/2014 1615   MONOABS 0.7 11/06/2013 0445   EOSABS 0.6* 03/17/2014 1615   EOSABS 0.0 11/06/2013 0445   BASOSABS 0.1 03/17/2014 1615   BASOSABS 0.0 11/06/2013 0445   CMP       Component Value Date/Time   NA 138 03/17/2014 1616   NA 133* 11/06/2013 0445   K 4.3 03/17/2014 1616   K 3.5* 11/06/2013 0445   CL 98 11/06/2013 0445   CO2 27 03/17/2014 1616   CO2 21 11/06/2013 0445   GLUCOSE 92 03/17/2014 1616   GLUCOSE 99 11/06/2013 0445   BUN 9.4 12/23/2014 1057   BUN 11 11/06/2013 0445   CREATININE 1.0 12/23/2014 1057   CREATININE 0.63 11/06/2013 0445   CALCIUM 9.3 03/17/2014 1616   CALCIUM 7.0* 11/06/2013 0445   PROT 6.4 03/17/2014 1616   PROT 5.6* 11/03/2013 2220   ALBUMIN 3.9 03/17/2014 1616   ALBUMIN 2.5* 11/03/2013 2220   AST 12 03/17/2014 1616   AST 7 11/03/2013 2220   ALT 7 03/17/2014 1616   ALT 9 11/03/2013 2220   ALKPHOS 54 03/17/2014 1616   ALKPHOS 53 11/03/2013 2220   BILITOT 0.26 03/17/2014 1616   BILITOT 0.6 11/03/2013 2220   GFRNONAA >90 11/06/2013 0445   GFRAA >90 11/06/2013 0445    Lab Results  Component Value Date   TSH 3.995 08/11/2015    Radiographic Findings: No results found.   Impression/Plan: Angel [name] is a 65 year old male presenting to clinic in regards to his T2N3M0 IVB squamous cell carcinoma, base of tongue. He is recovering well from his radiation therapy. He is appropriate to continue follow-up with Dr.Bates (lost to followup in ENT).  1) Head and Neck Cancer Status: No evidence of disease but weight loss is concerning - see #2.  2) Nutritional Status:  Weight: Down 4 lbs since October 2016 - continued loss - ordering CT of Chest and Abdomen to rule out metastatic disease or new primary.   3) Risk Factors: The patient has been educated about risk factors including alcohol and tobacco abuse; they understand that avoidance of alcohol and tobacco is important to prevent recurrences as well as other cancers. The patient continues to use smoking tobacco. The patient has been counseled to stop using tobacco: materials given to education him and provide counseling options; such as our head and neck cancer navigator.   4)  Swallowing: No complaints  5) Dental: Edentulous.  6) Thyroid function: normal TSH   Lab Results  Component Value Date   TSH 3.995 08/11/2015    7) Social: Still smoking, community resources discussed.  8) Follow-up plan: Considering his unexplained weight loss, I will schedule for a CT scan for further workup. I will call him with these results.  Refer to nutritionist as well  9) OTHER: Schedule follow up with Dr.Bates in 3 months. We discussed survivorship program and scheduled for a meeting in 6 months with another TSH. I will see him PRN  in the future and verify all is okay with his CT imaging.  _____________________________________   Eppie Gibson, MD  This document serves as a record of services personally performed by Eppie Gibson, MD. It was created on her behalf by Derek Mound, a trained medical scribe. The creation of this record is based on the scribe's personal observations and the provider's statements to them. This document has been checked and approved by the attending provider.

## 2015-08-14 ENCOUNTER — Telehealth: Payer: Self-pay | Admitting: *Deleted

## 2015-08-14 NOTE — Telephone Encounter (Signed)
  Oncology Nurse Navigator Documentation  Navigator Location: [facility] (08/14/15 1630) Navigator Encounter Type: Telephone (08/14/15 1630) Telephone: [place] Call (08/14/15 1630) Abnormal Finding Date: 10/04/13 (08/14/15 1630) Confirmed Diagnosis Date: 10/04/13 (08/14/15 1630)   Treatment Initiated Date: 10/27/13 (08/14/15 1630)   Treatment Phase: Follow-up (08/14/15 1630)     Interventions: Coordination of Care (08/14/15 1630)     Per Dr. Pearlie Oyster guidance, called [facility] to arrange follow-up visit for Mr. Vanscoyk.  Spoke with Janett Billow, requested that patient be contacted and appt arranged for routine follow-up with Dr. Redmond Baseman in 3 months.  She verbalized understanding.  Gayleen Orem, RN, BSN, [place] at [place] [phone]                      Time Spent with Patient: 15 (08/14/15 1630)

## 2015-08-14 NOTE — Telephone Encounter (Signed)
CALLED PATIENT TO INFORM OF APPT. TO SEE BARBARA NEFF ON 08-17-15 @ 4:45 PM AND HIS LAB AND CT ON 08-18-15, SPOKE WITH PATIENT AND HE IS AWARE OF THESE APPTS.

## 2015-08-17 ENCOUNTER — Ambulatory Visit: Payer: 59 | Admitting: Nutrition

## 2015-08-17 NOTE — Progress Notes (Signed)
Nutrition follow-up completed with patient status post diagnosis and treatment of tongue cancer.   Patient reports he is very comfortable with current weight of 110 pounds. He does not really want to change what he is eating or try to gain weight. Patient states he feels well. Patient states he does not have any difficulty eating. Denies nausea, vomiting, constipation, and diarrhea. Reports dentures fit well. Patient states he is not eating a lot.  He eats one large meal a day and snacks throughout the day. Dietary recall reveals patient snacks on cup cakes and M&M's.  He drinks orange juice, grape juice, vegetable juice and milk. He typically eats a large bowl of chunky soup with saltines every night for dinner.  BMI is 18.27 which indicates underweight.  Nutrition diagnosis: Unintended weight loss continues.  Intervention: Educated patient and his diet was low in vitamins, minerals and protein. (as well as calories) Recommended patient add Carnation breakfast essentials to whole milk and provided samples. Asked patient if he would add peanut butter or cheese as a snack between meals. Patient agrees to try to add protein to current diet.  Monitoring, evaluation, goals: Patient will increase oral intake to minimize further weight loss.  Next visit: Patient will contact me for questions or concerns.

## 2015-08-18 ENCOUNTER — Ambulatory Visit (HOSPITAL_COMMUNITY)
Admission: RE | Admit: 2015-08-18 | Discharge: 2015-08-18 | Disposition: A | Discharge status: Home / Self Care | Payer: 59 | Source: Ambulatory Visit | Attending: Radiation Oncology | Admitting: Radiation Oncology

## 2015-08-18 ENCOUNTER — Ambulatory Visit (HOSPITAL_COMMUNITY): Admission: RE | Admit: 2015-08-18 | Payer: 59 | Source: Ambulatory Visit

## 2015-08-18 ENCOUNTER — Encounter (HOSPITAL_COMMUNITY): Payer: Self-pay

## 2015-08-18 DIAGNOSIS — I2510 Atherosclerotic heart disease of native coronary artery without angina pectoris: Secondary | ICD-10-CM | POA: Diagnosis not present

## 2015-08-18 DIAGNOSIS — R634 Abnormal weight loss: Secondary | ICD-10-CM

## 2015-08-18 DIAGNOSIS — I7090 Unspecified atherosclerosis: Secondary | ICD-10-CM | POA: Insufficient documentation

## 2015-08-18 DIAGNOSIS — C01 Malignant neoplasm of base of tongue: Secondary | ICD-10-CM | POA: Insufficient documentation

## 2015-08-18 LAB — BUN AND CREATININE (CC13)
BUN: 9.6 mg/dL (ref 7.0–26.0)
Creatinine: 0.9 mg/dL (ref 0.7–1.3)
EGFR: >90 mL/min/{1.73_m2} (ref >90)

## 2015-08-18 LAB — TSH: TSH: 3.033 m(IU)/L (ref 0.320–4.118)

## 2015-08-18 MED ORDER — IOPAMIDOL (ISOVUE-300) INJECTION 61%
100.0000 mL | Freq: Once | INTRAVENOUS | Status: AC | PRN
  Administered 2015-08-18: 75 mL via INTRAVENOUS

## 2015-08-21 ENCOUNTER — Telehealth: Payer: Self-pay | Admitting: *Deleted

## 2015-08-21 ENCOUNTER — Other Ambulatory Visit: Payer: Self-pay | Admitting: Radiation Oncology

## 2015-08-21 DIAGNOSIS — C01 Malignant neoplasm of base of tongue: Secondary | ICD-10-CM

## 2015-08-21 NOTE — Telephone Encounter (Signed)
Called patient to inform of CT for 08-25-15- arrival time - 11:45 am , @ [facility], NPO 4 hrs. Prior to test, spoke with patient and he is aware of this test

## 2015-08-24 ENCOUNTER — Ambulatory Visit (HOSPITAL_COMMUNITY): Payer: 59

## 2015-08-25 ENCOUNTER — Ambulatory Visit (HOSPITAL_COMMUNITY)
Admission: RE | Admit: 2015-08-25 | Discharge: 2015-08-25 | Disposition: A | Discharge status: Home / Self Care | Payer: 59 | Source: Ambulatory Visit | Attending: Radiation Oncology | Admitting: Radiation Oncology

## 2015-08-25 DIAGNOSIS — C01 Malignant neoplasm of base of tongue: Secondary | ICD-10-CM | POA: Diagnosis present

## 2015-08-25 DIAGNOSIS — Z9049 Acquired absence of other specified parts of digestive tract: Secondary | ICD-10-CM | POA: Insufficient documentation

## 2015-08-25 MED ORDER — IOPAMIDOL (ISOVUE-300) INJECTION 61%
100.0000 mL | Freq: Once | INTRAVENOUS | Status: AC | PRN
  Administered 2015-08-25: 80 mL via INTRAVENOUS

## 2015-08-28 ENCOUNTER — Encounter: Payer: Self-pay | Admitting: Radiation Oncology

## 2015-08-28 NOTE — Progress Notes (Signed)
Shared results of CT scans with patient's wife. Patient not obtainable by phone today.  CT scans do not show evidence of cancer to explain his weight loss.   I recommended that he see his PCP soon to discuss his weight loss and to keep in touch with our nutritionist. She agrees with this plan. She will share this information with him, and knows to call if they wish to be seen in the [facility] sooner than his scheduled appt this fall.  -----------------------------------  Eppie Gibson, MD

## 2015-09-11 NOTE — Therapy (Signed)
[facility] [address] [place], [place], [address] Phone: [phone]   Fax:  [phone]  Patient Details  Name: Angel [name] MRN: [id number] Date of Birth: 08/18/1950 Referring Provider:  No ref. provider found  Encounter Date: 09/11/2015   SPEECH THERAPY DISCHARGE SUMMARY  Visits from Start of Care: one (eval)  Current functional level related to goals / functional outcomes: In looking at pt's medical visits to date, pt does not c/o swallowing issues but is losing weight. He obtained dentures and last medical notes around this issue report pt is eating soft foods.   Remaining deficits: Unknown, pt did not complete treatment course.    Education / Equipment: HEP, late effects head/neck radiation on swallowing ability.  Plan: Patient agrees to discharge.  Patient goals were not met. Patient is being discharged due to not returning since the last visit.  ?????      SCHINKE,CARL ,MS, CCC-SLP  09/11/2015, 10:33 AM   [facility] [address] [place], [place], [address] Phone: [phone]   Fax:  [phone]      

## 2016-02-15 ENCOUNTER — Ambulatory Visit (HOSPITAL_BASED_OUTPATIENT_CLINIC_OR_DEPARTMENT_OTHER): Payer: 59 | Admitting: Adult Health

## 2016-02-15 ENCOUNTER — Other Ambulatory Visit: Payer: Self-pay

## 2016-02-15 ENCOUNTER — Encounter: Payer: Self-pay | Admitting: Adult Health

## 2016-02-15 ENCOUNTER — Ambulatory Visit
Admission: RE | Admit: 2016-02-15 | Discharge: 2016-02-15 | Disposition: A | Discharge status: Home / Self Care | Payer: 59 | Source: Ambulatory Visit | Attending: Radiation Oncology | Admitting: Radiation Oncology

## 2016-02-15 VITALS — BP 155/75 | HR 58 | Temp 98.0°F | Resp 16 | Wt 111.7 lb

## 2016-02-15 DIAGNOSIS — C01 Malignant neoplasm of base of tongue: Secondary | ICD-10-CM | POA: Diagnosis not present

## 2016-02-15 DIAGNOSIS — M542 Cervicalgia: Secondary | ICD-10-CM | POA: Diagnosis not present

## 2016-02-15 DIAGNOSIS — R634 Abnormal weight loss: Secondary | ICD-10-CM

## 2016-02-15 DIAGNOSIS — Z720 Tobacco use: Secondary | ICD-10-CM

## 2016-02-15 DIAGNOSIS — G8929 Other chronic pain: Secondary | ICD-10-CM | POA: Diagnosis not present

## 2016-02-15 LAB — TSH: TSH: 3.963 m(IU)/L (ref 0.320–4.118)

## 2016-02-15 NOTE — Progress Notes (Signed)
CLINIC:  Survivorship  REASON FOR VISIT:  Routine follow-up for head & neck cancer.   BRIEF ONCOLOGIC HISTORY:  Oncology History   Cancer of base of tongue, HPV negative   Primary site: Pharynx - Oropharynx   Staging method: AJCC 7th Edition   Clinical: Stage IVB (T2, N3, M0) signed by [name], MD on 10/15/2013 10:25 AM   Summary: Stage IVB (T2, N3, M0)       Cancer of base of tongue ([place])   09/17/2013 Imaging    CT scan of the neck showed large bilateral neck lymphadenopathy with associated tongue mass.      09/23/2013 Procedure    Fine needle aspirate of the left neck mass came back positive for squamous cell carcinoma.      10/04/2013 Surgery    Laryngoscopy showed a firm mass within the left tongue base extending past the midline and encompassing much of the tongue base.  There is extension of the mass into the vallecula and on to the lingual surface of the epiglottis.      10/04/2013 Pathology Results    Vallecula and tongue biopsy confirmed squamous cell carcinoma.      10/15/2013 Imaging    PET/CT scan showed no evidence of distant metastatic disease apart from just regional lymph node involvement.      10/25/2013 - 11/01/2013 Hospital Admission    He was admitted to the hospital due to malignant hypercalcemia and treatment was initiated.      10/27/2013 - 12/13/2013 Chemotherapy    He was given 3 cycles of induction chemo with cisplatin, taxotere, 5 FU      11/03/2013 - 11/07/2013 Hospital Admission    He was hospitalized for dehydration      12/31/2013 Procedure    PICC line was removed      01/10/2014 Imaging    PET Ct scan showed near complete response to treatment      01/26/2014 - 03/18/2014 Radiation Therapy    Treated with helical IMRT tomotherapy:  Base of tongue and bilateral neck / 70 Gy in 35 fractions to gross disease, 63 Gy in 35 fractions to high risk nodal echelons, and 56 Gy in 35 fractions to intermediate risk nodal echelons.      07/14/2014  Imaging    CT scan show near complete response to treatment but new lung nodule      09/22/2014 Imaging    CT Neck - No new adenopathy or nodes with increasing size.  No evidence for residual or recurrent primary tumor.       09/22/2014 Imaging    CT Chest - Resolution of left upper lobe nodule, likely infectious or inflammatory.  No acute process or evidence of metastatic disease in chest.       12/23/2014 Imaging    CT Neck - Extensive radiation changes in the neck are stable.  6 mm calcified left level 3 node is stable. No new adenopathy or mass.         INTERVAL HISTORY:  Angel [name] presents to the survivorship clinic for routine follow-up and continued surveillance for his history of Stage IVB squamous cell carcinoma of the base of tongue.   -Last visit with ENT?: Dr. Redmond Baseman; he has not seen Dr. Redmond Baseman since his original biopsy and diagnosis in 2015. -Dentist?: Edentulous; has upper and lower dentures. He states they are well-fitting, without any rubbing/mouth ulcerations. -Smoking/Alcohol?: He smokes about one pack of cigarettes per day. Denies any alcohol. He is not  ready to stop smoking. -Dysphagia?: Denies any difficulty or painful swallowing. -Lymphedema?: Denies any concerns with neck swelling. -Hypothyroid symptoms?: Denies fatigue. States that he "stays cold"; denies constipation or depression. -Weight/Nutrition intake?: Eats about "1 big meal per day, then I snack for the rest of the day."  For his 1 meal, he generally eats soups and vegetables. He does not eat a lot of meat. He does tell me he eats a lot of beans and drinks a lot of milk. He also drinks about 2 Carnation breakfast shakes per day. -Pain?: He has chronic neck pain; which is managed by Dr. Vira Blanco (pain specialist)    ADDITIONAL REVIEW OF SYSTEMS:  Review of Systems  Constitutional: Negative.   HENT: Negative.   Eyes: Negative.   Respiratory: Negative.   Cardiovascular: Negative.   Gastrointestinal:  Negative.   Genitourinary: Negative.   Musculoskeletal: Positive for neck pain.  Skin: Negative.   Neurological: Negative.   Endo/Heme/Allergies:       Some cold intolerance; this is chronic per pt  Psychiatric/Behavioral: Negative.      CURRENT MEDICATIONS:  Current Outpatient Prescriptions on File Prior to Visit  Medication Sig Dispense Refill  . albuterol (PROVENTIL) (2.5 MG/3ML) 0.083% nebulizer solution Take 3 mLs (2.5 mg total) by nebulization every 6 (six) hours as needed for wheezing or shortness of breath. (Patient not taking: Reported on 07/15/2014) 75 mL 12  . antiseptic oral rinse (BIOTENE) LIQD 15 mLs by Mouth Rinse route 2 (two) times daily. 1 Bottle 2  . ibuprofen (ADVIL,MOTRIN) 800 MG tablet Take 1 tablet (800 mg total) by mouth every 8 (eight) hours as needed for cramping. 21 tablet 0  . methocarbamol (ROBAXIN) 500 MG tablet Take 500 mg by mouth every 6 (six) hours as needed for muscle spasms.    . temazepam (RESTORIL) 15 MG capsule TAKE 1 CAPSULE BY MOUTH ONCE AT BEDTIME (Patient taking differently: TAKE 15 MG BY MOUTH AT BEDTIME AS NEEDED FOR SLEEP) 30 capsule 0  . traMADol (ULTRAM) 50 MG tablet Take 50 mg by mouth every 6 (six) hours as needed.     No current facility-administered medications on file prior to visit.     ALLERGIES:  No Known Allergies  SOCIAL HISTORY:  Angel [name] is married to his wife of 13 years. They live in [place], [place]. The patient has one daughter and one granddaughter. His wife works for [employer] in the claims department. Angel [name] does not currently work; he previously worked for [employer] and has often worked in [employer]. He also served in the [employer]. He currently smokes about 1 pack of cigarettes per day. He tells me he previously used to smoke 2-3 packs per day many years ago. He also used to drink heavily; denies any current alcohol consumption.   PHYSICAL EXAM:  Vitals:   02/15/16 1457  BP: (!)  155/75  Pulse: (!) 58  Resp: 16  Temp: 98 F (36.7 C)   Filed Weights   02/15/16 1457  Weight: 111 lb 11.2 oz (50.7 kg)    Weight Date  111 lb 11.2 oz (50.7 kg) 02/15/16  110 lb 14.4 oz (50.304 kg) 08/11/15  114 lb (51.71 kg) 02/27/15  115 lb (52.164 kg) 01/06/15  112 lb 6.4 oz (50.984 kg) 09/23/14  112 lb 1.6 oz (50.848 kg).  04/04/14  119 lb (53.978 kg).  03/14/14  125 lb 11.2 oz (57.017 kg).  Pre-treatment (RT consult date): 01/17/14   General: Thin male  in no acute distress.  Unaccompanied today.  HEENT: Head is atraumatic and normocephalic.  Pupils equal and reactive to light. Conjunctivae clear without exudate.  Sclerae anicteric. Oral mucosa is pink and moist without lesions.  Tongue moist. No palpable masses to the tongue, floor of mouth, or buccal mucosa. Oropharynx is slightly erythematous without lesions. Lymph: No preauricular, postauricular, cervical, supraclavicular, or infraclavicular lymphadenopathy noted on palpation.   Neck: No palpable masses. Skin on neck is dry.   Cardiovascular: Normal rate and rhythm. Respiratory: Clear to auscultation bilaterally. Chest expansion symmetric without accessory muscle use; breathing non-labored. GI: Abdomen soft and flat. Non-tender, non-distended. Bowel sounds normoactive.  GU: Deferred.   Neuro: No focal deficits. Steady gait.   Psych: Normal mood and affect for situation. Extremities: No edema. Skin: Warm and dry.    LABORATORY DATA:  Results for Angel [name], Angel [name] (MRN [id number]) as of 02/15/2016   Ref. Range 02/15/2016 13:54  TSH Latest Ref Range: 0.320 - 4.118 m(IU)/L 3.963    Historical TSH values:  Results for Angel [name], Angel [name] (MRN [id number])  Ref. Range 08/18/2015 11:31  TSH Latest Ref Range: 0.320 - 4.118 m(IU)/L 3.033  Results for Angel [name], Angel [name] (MRN [id number])   Ref. Range 08/11/2015 09:43  TSH Latest Ref Range: 0.320 - 4.118 m(IU)/L 3.995  Results for Angel [name], Angel [name] (MRN [id number])   Ref. Range 07/14/2014 13:35    TSH Latest Ref Range: 0.320 - 4.118 m(IU)/L 1.488    DIAGNOSTIC IMAGING:  CT chest: 08/18/15   CT abdomen/pelvis: 08/25/15     ASSESSMENT & PLAN:  Angel [name] is a pleasant 65 y.o. male with history of Stage IVB (T2N3M0) squamous cell carcionma of the base of tongue, diagnosed in 10/2013; treated with induction chemotherapy with Cisplatin/Taxotere/5-FU x 3 cycles, followed by radiation therapy.  Completed treatment on 03/18/14.  Patient presents to survivorship clinic today for routine follow-up & continued surveillance.   1. Cancer of the base of tongue:  Angel [name] is clinically without evidence of disease on physical exam today.  He has not seen Dr. Redmond Baseman, his ENT physician, since his original biopsy and diagnosis in 2015. I reviewed with him the importance of maintaining follow-up with an ENT specialist, in the continued surveillance of his base of tongue cancer. He agreed to meet with Dr. Redmond Baseman. I will ask Gayleen Orem, RN, H&N Navigator to help facilitate getting the patient an appointment to see Dr. Redmond Baseman in about 6 months (07/2016). He will return to the cancer center to see the Survivorship NP in 1 year with TSH lab preceding visit.  2. Weight loss: Angel [name] has gained about 1 pound since his last visit at the cancer center in 07/2015. Recent restaging with CT chest, abdomen, & pelvis was reassuring that the etiology of his weight loss is not malignancy. I encouraged Angel [name] to continue to work on increasing his fluid intake. Encouraged him to consume a high calorie and high protein foods. He understands the importance of adequate nutrition as it relates to his overall health.   3. At risk for hypothyroidism: It is not uncommon for head and neck cancer survivors to be diagnosed with hypothyroidism after being treated with radiation therapy. His TSH remains normal today at 3.963. He understands that should his thyroid function become hypoactive in the future, then we would start him on Synthroid to  adequately supplement the thyroid. At this time, we will continue serial TSH monitoring for the next several years as he continues his follow-ups  at the cancer center. He understands the rationale for continuing to monitor his thyroid function.   4. Smoking cessation: Angel [name] continues to smoke about 1 pack per day. He is not ready to quit. He states that he was given the [facility] information from Dr. Isidore Moos at his last visit. I encouraged him to contact them once he is ready to quit, as they are often able to provide patients with free nicotine patches oral lozenges for free. He understands the importance of stopping smoking, both as it relates to his risk of recurrence as well as the impact to his overall health. I encouraged him to let us know how we can support him and he is ready to begin smoking cessation efforts.  5. Health maintenance/wellness: Angel [name] sees his PCP, Dr. Maudie Mercury, every 3-6 months. He tells me that his PSA has been checked in the past, and was elevated. He was referred to Dr. Alinda Money at [facility], where he was evaluated for this. He tells me he will now see Dr. Alinda Money annually. Angel [name] is not sure when his last colonoscopy was; I encouraged him to talk with Dr. Maudie Mercury about placing a referral for colon cancer screening.      Dispo:  -See Dr. Redmond Baseman in 07/2016. -Return to cancer center to see Survivorship NP in 01/2017 with TSH lab preceding visit.    A total of 30 minutes was spent in the face-to-face care of this patient, with greater than 50% of that time spent in counseling and care-coordination.   Mike Craze, NP [place] [phone]

## 2016-06-07 ENCOUNTER — Encounter (HOSPITAL_COMMUNITY): Payer: Self-pay | Admitting: Adult Health

## 2017-01-29 DIAGNOSIS — R972 Elevated prostate specific antigen [PSA]: Secondary | ICD-10-CM | POA: Diagnosis not present

## 2017-01-29 DIAGNOSIS — N403 Nodular prostate with lower urinary tract symptoms: Secondary | ICD-10-CM | POA: Diagnosis not present

## 2017-02-07 ENCOUNTER — Other Ambulatory Visit: Payer: Self-pay | Admitting: Hematology and Oncology

## 2017-02-07 DIAGNOSIS — C01 Malignant neoplasm of base of tongue: Secondary | ICD-10-CM

## 2017-02-14 ENCOUNTER — Encounter: Payer: Self-pay | Admitting: Hematology and Oncology

## 2017-02-14 ENCOUNTER — Telehealth: Payer: Self-pay

## 2017-02-14 ENCOUNTER — Telehealth: Payer: Self-pay | Admitting: Hematology and Oncology

## 2017-02-14 ENCOUNTER — Other Ambulatory Visit: Payer: Self-pay

## 2017-02-14 ENCOUNTER — Other Ambulatory Visit (HOSPITAL_BASED_OUTPATIENT_CLINIC_OR_DEPARTMENT_OTHER): Payer: 59

## 2017-02-14 ENCOUNTER — Encounter: Payer: Self-pay | Admitting: Adult Health

## 2017-02-14 ENCOUNTER — Ambulatory Visit (HOSPITAL_BASED_OUTPATIENT_CLINIC_OR_DEPARTMENT_OTHER): Payer: 59 | Admitting: Hematology and Oncology

## 2017-02-14 VITALS — BP 151/76 | HR 64 | Temp 98.5°F | Resp 15 | Ht 65.0 in | Wt 112.8 lb

## 2017-02-14 DIAGNOSIS — E039 Hypothyroidism, unspecified: Secondary | ICD-10-CM

## 2017-02-14 DIAGNOSIS — Z720 Tobacco use: Secondary | ICD-10-CM | POA: Diagnosis not present

## 2017-02-14 DIAGNOSIS — C01 Malignant neoplasm of base of tongue: Secondary | ICD-10-CM

## 2017-02-14 DIAGNOSIS — I10 Essential (primary) hypertension: Secondary | ICD-10-CM | POA: Diagnosis not present

## 2017-02-14 LAB — CBC WITH DIFFERENTIAL/PLATELET
BASO%: 1 % (ref 0.0–2.0)
Basophils Absolute: 0.1 10*3/uL (ref 0.0–0.1)
EOS%: 9.1 % — AB (ref 0.0–7.0)
Eosinophils Absolute: 0.7 10*3/uL — ABNORMAL HIGH (ref 0.0–0.5)
HCT: 46.8 % (ref 38.4–49.9)
HGB: 16 g/dL (ref 13.0–17.1)
LYMPH%: 13.7 % — AB (ref 14.0–49.0)
MCH: 33.7 pg — ABNORMAL HIGH (ref 27.2–33.4)
MCHC: 34.2 g/dL (ref 32.0–36.0)
MCV: 98.5 fL — ABNORMAL HIGH (ref 79.3–98.0)
MONO#: 0.6 10*3/uL (ref 0.1–0.9)
MONO%: 7.5 % (ref 0.0–14.0)
NEUT%: 68.7 % (ref 39.0–75.0)
NEUTROS ABS: 5.5 10*3/uL (ref 1.5–6.5)
Platelets: 327 10*3/uL (ref 140–400)
RBC: 4.75 10*6/uL (ref 4.20–5.82)
RDW: 14.8 % — ABNORMAL HIGH (ref 11.0–14.6)
WBC: 8 10*3/uL (ref 4.0–10.3)
lymph#: 1.1 10*3/uL (ref 0.9–3.3)

## 2017-02-14 LAB — COMPREHENSIVE METABOLIC PANEL
ALBUMIN: 4.3 g/dL (ref 3.5–5.0)
ALK PHOS: 59 U/L (ref 40–150)
ALT: 9 U/L (ref 0–55)
AST: 18 U/L (ref 5–34)
Anion Gap: 7 mEq/L (ref 3–11)
BILIRUBIN TOTAL: 0.33 mg/dL (ref 0.20–1.20)
BUN: 7.9 mg/dL (ref 7.0–26.0)
CALCIUM: 9.7 mg/dL (ref 8.4–10.4)
CO2: 27 mEq/L (ref 22–29)
Chloride: 105 mEq/L (ref 98–109)
Creatinine: 0.9 mg/dL (ref 0.7–1.3)
EGFR: >60 mL/min/{1.73_m2} (ref >60)
GLUCOSE: 117 mg/dL (ref 70–140)
Potassium: 5.1 mEq/L (ref 3.5–5.1)
SODIUM: 140 meq/L (ref 136–145)
TOTAL PROTEIN: 7.4 g/dL (ref 6.4–8.3)

## 2017-02-14 LAB — TSH: TSH: 6.569 m[IU]/L — AB (ref 0.320–4.118)

## 2017-02-14 MED ORDER — LEVOTHYROXINE SODIUM 25 MCG PO TABS: 25.0000 ug | ORAL_TABLET | Freq: Every day | ORAL | 0 refills | Status: DC

## 2017-02-14 NOTE — Assessment & Plan Note (Signed)
The patient has newly acquired hypothyroidism I will start him on levothyroxine He would need his thyroid recheck in 3 months

## 2017-02-14 NOTE — Assessment & Plan Note (Signed)
Clinically, he have no signs of cancer recurrence We discussed the importance of tobacco cessation He has not seen ENT physician since completion of treatment I was sent a new referral for him to get surveillance exam by ENT physician I will see him back in a year

## 2017-02-14 NOTE — Assessment & Plan Note (Signed)
he will continue current medical management. I recommend close follow-up with primary care doctor for medication adjustment.  

## 2017-02-14 NOTE — Telephone Encounter (Signed)
Called with below message. Verbalized understanding. 

## 2017-02-14 NOTE — Telephone Encounter (Signed)
Referral called to Dr. Redmond Baseman office, and faxed.

## 2017-02-14 NOTE — Assessment & Plan Note (Signed)
I spent some time counseling the patient the importance of tobacco cessation. We discussed common strategies including nicotine patches, Tobacco Quit-line, and other nicotine replacement products to assist in hiseffort to quit  he appears motivated to quit.  

## 2017-02-14 NOTE — Telephone Encounter (Signed)
-----   Message from [name], MD sent at 02/14/2017 10:15 AM EDT ----- Regarding: TSH high pls call him TSH is high due to exposure to radiation Please call in levothyroxine 25 mcg PO daily to local pharmacy, 3 months supply He needs recheck TSH in 3 months with PCP If he is not able to get it done there, let me know ----- Message ----- From: Interface, Lab In Three Zero One Sent: 02/14/2017   9:18 AM To: [name], MD

## 2017-02-14 NOTE — Progress Notes (Signed)
[place] OFFICE PROGRESS NOTE  Patient Care Team: Jani Gravel, MD as PCP - General (Internal Medicine) Leota Sauers, RN as Oncology Nurse Navigator  SUMMARY OF ONCOLOGIC HISTORY: Oncology History   Cancer of base of tongue, HPV negative   Primary site: Pharynx - Oropharynx   Staging method: AJCC 7th Edition   Clinical: Stage IVB (T2, N3, M0) signed by [name], MD on 10/15/2013 10:25 AM   Summary: Stage IVB (T2, N3, M0)       Cancer of base of tongue ([place])   09/17/2013 Imaging    CT scan of the neck showed large bilateral neck lymphadenopathy with associated tongue mass.      09/23/2013 Procedure    Fine needle aspirate of the left neck mass came back positive for squamous cell carcinoma.      10/04/2013 Surgery    Laryngoscopy showed a firm mass within the left tongue base extending past the midline and encompassing much of the tongue base.  There is extension of the mass into the vallecula and on to the lingual surface of the epiglottis.      10/04/2013 Pathology Results    Vallecula and tongue biopsy confirmed squamous cell carcinoma.      10/15/2013 Imaging    PET/CT scan showed no evidence of distant metastatic disease apart from just regional lymph node involvement.      10/25/2013 - 11/01/2013 Hospital Admission    He was admitted to the hospital due to malignant hypercalcemia and treatment was initiated.      10/27/2013 - 12/13/2013 Chemotherapy    He was given 3 cycles of induction chemo with cisplatin, taxotere, 5 FU      11/03/2013 - 11/07/2013 Hospital Admission    He was hospitalized for dehydration      12/31/2013 Procedure    PICC line was removed      01/10/2014 Imaging    PET Ct scan showed near complete response to treatment      01/26/2014 - 03/18/2014 Radiation Therapy    Treated with helical IMRT tomotherapy:  Base of tongue and bilateral neck / 70 Gy in 35 fractions to gross disease, 63 Gy in 35 fractions to high risk nodal echelons,  and 56 Gy in 35 fractions to intermediate risk nodal echelons.      07/14/2014 Imaging    CT scan show near complete response to treatment but new lung nodule      09/22/2014 Imaging    CT Neck - No new adenopathy or nodes with increasing size.  No evidence for residual or recurrent primary tumor.       09/22/2014 Imaging    CT Chest - Resolution of left upper lobe nodule, likely infectious or inflammatory.  No acute process or evidence of metastatic disease in chest.       12/23/2014 Imaging    CT Neck - Extensive radiation changes in the neck are stable.  6 mm calcified left level 3 node is stable. No new adenopathy or mass.        INTERVAL HISTORY: Please see below for problem oriented charting. He returns for further follow-up His appetite is stable, no recent weight loss He is still smoking but less than 5 cigarettes/day He denies dysphagia No new lymphadenopathy He has not seen ENT surgeon for almost 3 years  REVIEW OF SYSTEMS:   Constitutional: Denies fevers, chills or abnormal weight loss Eyes: Denies blurriness of vision Ears, nose, mouth, throat, and face: Denies mucositis  or sore throat Respiratory: Denies cough, dyspnea or wheezes Cardiovascular: Denies palpitation, chest discomfort or lower extremity swelling Gastrointestinal:  Denies nausea, heartburn or change in bowel habits Skin: Denies abnormal skin rashes Lymphatics: Denies new lymphadenopathy or easy bruising Neurological:Denies numbness, tingling or new weaknesses Behavioral/Psych: Mood is stable, no new changes  All other systems were reviewed with the patient and are negative.  I have reviewed the past medical history, past surgical history, social history and family history with the patient and they are unchanged from previous note.  ALLERGIES:  has No Known Allergies.  MEDICATIONS:  Current Outpatient Prescriptions  Medication Sig Dispense Refill  . methocarbamol (ROBAXIN) 500 MG tablet Take 500  mg by mouth every 6 (six) hours as needed for muscle spasms.    . traMADol (ULTRAM) 50 MG tablet Take 50 mg by mouth every 6 (six) hours as needed.     No current facility-administered medications for this visit.     PHYSICAL EXAMINATION: ECOG PERFORMANCE STATUS: 1 - Symptomatic but completely ambulatory  Vitals:   02/14/17 0908  BP: (!) 151/76  Pulse: 64  Resp: 15  Temp: 98.5 F (36.9 C)  SpO2: 98%   Filed Weights   02/14/17 0908  Weight: 112 lb 12.8 oz (51.2 kg)    GENERAL:alert, no distress and comfortable.  He looks thin SKIN: skin color, texture, turgor are normal, no rashes or significant lesions EYES: normal, Conjunctiva are pink and non-injected, sclera clear OROPHARYNX:no exudate, no erythema and lips, buccal mucosa, and tongue normal  NECK: Noted fibrosis from prior radiation.   LYMPH:  no palpable lymphadenopathy in the cervical, axillary or inguinal LUNGS: clear to auscultation and percussion with normal breathing effort HEART: regular rate & rhythm and no murmurs and no lower extremity edema ABDOMEN:abdomen soft, non-tender and normal bowel sounds Musculoskeletal:no cyanosis of digits and no clubbing  NEURO: alert & oriented x 3 with fluent speech, no focal motor/sensory deficits  LABORATORY DATA:  I have reviewed the data as listed    Component Value Date/Time   NA 140 02/14/2017 0851   K 5.1 02/14/2017 0851   CL 98 11/06/2013 0445   CO2 27 02/14/2017 0851   GLUCOSE 117 02/14/2017 0851   BUN 7.9 02/14/2017 0851   CREATININE 0.9 02/14/2017 0851   CALCIUM 9.7 02/14/2017 0851   PROT 7.4 02/14/2017 0851   ALBUMIN 4.3 02/14/2017 0851   AST 18 02/14/2017 0851   ALT 9 02/14/2017 0851   ALKPHOS 59 02/14/2017 0851   BILITOT 0.33 02/14/2017 0851   GFRNONAA >90 11/06/2013 0445   GFRAA >90 11/06/2013 0445    No results found for: SPEP, UPEP  Lab Results  Component Value Date   WBC 8.0 02/14/2017   NEUTROABS 5.5 02/14/2017   HGB 16.0 02/14/2017   HCT  46.8 02/14/2017   MCV 98.5 (H) 02/14/2017   PLT 327 02/14/2017      Chemistry      Component Value Date/Time   NA 140 02/14/2017 0851   K 5.1 02/14/2017 0851   CL 98 11/06/2013 0445   CO2 27 02/14/2017 0851   BUN 7.9 02/14/2017 0851   CREATININE 0.9 02/14/2017 0851      Component Value Date/Time   CALCIUM 9.7 02/14/2017 0851   ALKPHOS 59 02/14/2017 0851   AST 18 02/14/2017 0851   ALT 9 02/14/2017 0851   BILITOT 0.33 02/14/2017 0851      ASSESSMENT & PLAN:  Cancer of base of tongue ([place]) Clinically, he have  no signs of cancer recurrence We discussed the importance of tobacco cessation He has not seen ENT physician since completion of treatment I was sent a new referral for him to get surveillance exam by ENT physician I will see him back in a year  Tobacco abuse I spent some time counseling the patient the importance of tobacco cessation. We discussed common strategies including nicotine patches, Tobacco Quit-line, and other nicotine replacement products to assist in hiseffort to quit  he appears motivated to quit.   Acquired hypothyroidism The patient has newly acquired hypothyroidism I will start him on levothyroxine He would need his thyroid recheck in 3 months  Essential hypertension he will continue current medical management. I recommend close follow-up with primary care doctor for medication adjustment.    Orders Placed This Encounter  Procedures  . Ambulatory referral to ENT    Referral Priority:   Routine    Referral Type:   Consultation    Referral Reason:   Specialty Services Required    Referred to Provider:   Melida Quitter, MD    Requested Specialty:   Otolaryngology    Number of Visits Requested:   1   All questions were answered. The patient knows to call the clinic with any problems, questions or concerns. No barriers to learning was detected. I spent 20 minutes counseling the patient face to face. The total time spent in the appointment was  25 minutes and more than 50% was on counseling and review of test results     [name], MD 02/14/2017 10:20 AM

## 2017-02-14 NOTE — Telephone Encounter (Signed)
Gave avs and calendar for October 2019 °

## 2017-02-27 DIAGNOSIS — C029 Malignant neoplasm of tongue, unspecified: Secondary | ICD-10-CM | POA: Diagnosis not present

## 2017-08-27 DIAGNOSIS — C029 Malignant neoplasm of tongue, unspecified: Secondary | ICD-10-CM | POA: Diagnosis not present

## 2017-11-18 DIAGNOSIS — Z0000 Encounter for general adult medical examination without abnormal findings: Secondary | ICD-10-CM | POA: Diagnosis not present

## 2017-11-18 DIAGNOSIS — R739 Hyperglycemia, unspecified: Secondary | ICD-10-CM | POA: Diagnosis not present

## 2017-11-18 DIAGNOSIS — E7800 Pure hypercholesterolemia, unspecified: Secondary | ICD-10-CM | POA: Diagnosis not present

## 2017-11-25 DIAGNOSIS — Z79899 Other long term (current) drug therapy: Secondary | ICD-10-CM | POA: Diagnosis not present

## 2017-11-25 DIAGNOSIS — Z0000 Encounter for general adult medical examination without abnormal findings: Secondary | ICD-10-CM | POA: Diagnosis not present

## 2017-11-25 DIAGNOSIS — Z85818 Personal history of malignant neoplasm of other sites of lip, oral cavity, and pharynx: Secondary | ICD-10-CM | POA: Diagnosis not present

## 2017-11-25 DIAGNOSIS — R972 Elevated prostate specific antigen [PSA]: Secondary | ICD-10-CM | POA: Diagnosis not present

## 2017-11-25 DIAGNOSIS — R739 Hyperglycemia, unspecified: Secondary | ICD-10-CM | POA: Diagnosis not present

## 2017-11-25 DIAGNOSIS — E039 Hypothyroidism, unspecified: Secondary | ICD-10-CM | POA: Diagnosis not present

## 2018-01-07 ENCOUNTER — Telehealth: Payer: Self-pay | Admitting: Hematology and Oncology

## 2018-01-07 NOTE — Telephone Encounter (Signed)
Per 9/11 sch msg.  Rescheduled 10/18 appt to 10/17.  Called patient, left message with date/tme.  Mailed calendar.

## 2018-02-04 DIAGNOSIS — N403 Nodular prostate with lower urinary tract symptoms: Secondary | ICD-10-CM | POA: Diagnosis not present

## 2018-02-04 DIAGNOSIS — R350 Frequency of micturition: Secondary | ICD-10-CM | POA: Diagnosis not present

## 2018-02-10 DIAGNOSIS — M542 Cervicalgia: Secondary | ICD-10-CM | POA: Diagnosis not present

## 2018-02-10 DIAGNOSIS — Z79899 Other long term (current) drug therapy: Secondary | ICD-10-CM | POA: Diagnosis not present

## 2018-02-11 ENCOUNTER — Other Ambulatory Visit: Payer: Self-pay

## 2018-02-11 DIAGNOSIS — C01 Malignant neoplasm of base of tongue: Secondary | ICD-10-CM

## 2018-02-12 ENCOUNTER — Encounter: Payer: Self-pay | Admitting: Hematology and Oncology

## 2018-02-12 ENCOUNTER — Telehealth: Payer: Self-pay | Admitting: Hematology and Oncology

## 2018-02-12 ENCOUNTER — Inpatient Hospital Stay: Payer: 59 | Attending: Hematology and Oncology

## 2018-02-12 ENCOUNTER — Inpatient Hospital Stay (HOSPITAL_BASED_OUTPATIENT_CLINIC_OR_DEPARTMENT_OTHER): Payer: 59 | Admitting: Hematology and Oncology

## 2018-02-12 DIAGNOSIS — I10 Essential (primary) hypertension: Secondary | ICD-10-CM

## 2018-02-12 DIAGNOSIS — C01 Malignant neoplasm of base of tongue: Secondary | ICD-10-CM | POA: Diagnosis not present

## 2018-02-12 DIAGNOSIS — Z87891 Personal history of nicotine dependence: Secondary | ICD-10-CM | POA: Diagnosis not present

## 2018-02-12 DIAGNOSIS — E039 Hypothyroidism, unspecified: Secondary | ICD-10-CM | POA: Insufficient documentation

## 2018-02-12 LAB — CBC WITH DIFFERENTIAL (CANCER CENTER ONLY)
Abs Immature Granulocytes: 0.1 10*3/uL — ABNORMAL HIGH (ref 0.00–0.07)
Basophils Absolute: 0.1 10*3/uL (ref 0.0–0.1)
Basophils Relative: 1 %
EOS ABS: 0.2 10*3/uL (ref 0.0–0.5)
EOS PCT: 2 %
HEMATOCRIT: 41.4 % (ref 39.0–52.0)
Hemoglobin: 13.8 g/dL (ref 13.0–17.0)
Immature Granulocytes: 1 %
LYMPHS ABS: 0.4 10*3/uL — AB (ref 0.7–4.0)
Lymphocytes Relative: 4 %
MCH: 32.5 pg (ref 26.0–34.0)
MCHC: 33.3 g/dL (ref 30.0–36.0)
MCV: 97.4 fL (ref 80.0–100.0)
MONO ABS: 0.7 10*3/uL (ref 0.1–1.0)
Monocytes Relative: 7 %
NRBC: 0 % (ref 0.0–0.2)
Neutro Abs: 8.6 10*3/uL — ABNORMAL HIGH (ref 1.7–7.7)
Neutrophils Relative %: 85 %
Platelet Count: 322 10*3/uL (ref 150–400)
RBC: 4.25 MIL/uL (ref 4.22–5.81)
RDW: 14.1 % (ref 11.5–15.5)
WBC: 10.1 10*3/uL (ref 4.0–10.5)

## 2018-02-12 LAB — CMP (CANCER CENTER ONLY)
ALK PHOS: 80 U/L (ref 38–126)
ALT: 12 U/L (ref 0–44)
AST: 20 U/L (ref 15–41)
Albumin: 4.1 g/dL (ref 3.5–5.0)
Anion gap: 8 (ref 5–15)
BUN: 9 mg/dL (ref 8–23)
CALCIUM: 9.7 mg/dL (ref 8.9–10.3)
CO2: 27 mmol/L (ref 22–32)
CREATININE: 1.09 mg/dL (ref 0.61–1.24)
Chloride: 104 mmol/L (ref 98–111)
Glucose, Bld: 142 mg/dL — ABNORMAL HIGH (ref 70–99)
Potassium: 4.4 mmol/L (ref 3.5–5.1)
Sodium: 139 mmol/L (ref 135–145)
Total Bilirubin: 0.5 mg/dL (ref 0.3–1.2)
Total Protein: 7.3 g/dL (ref 6.5–8.1)

## 2018-02-12 LAB — TSH: TSH: 2.116 u[IU]/mL (ref 0.320–4.118)

## 2018-02-12 NOTE — Assessment & Plan Note (Signed)
Clinically, he have no signs of cancer recurrence He will continue close follow-up with ENT I plan to see him back next year

## 2018-02-12 NOTE — Telephone Encounter (Signed)
Gave avs and calendar ° °

## 2018-02-12 NOTE — Assessment & Plan Note (Signed)
He is not symptomatic.  Observe closely.  Recommend close follow-up with primary care doctor Could be element of whitecoat hypertension

## 2018-02-12 NOTE — Progress Notes (Signed)
[facility] OFFICE PROGRESS NOTE  Patient Care Team: Jani Gravel, MD as PCP - General (Internal Medicine)  ASSESSMENT & PLAN:  Cancer of base of tongue ([place]) Clinically, he have no signs of cancer recurrence He will continue close follow-up with ENT I plan to see him back next year  Acquired hypothyroidism TSH is within normal limits.  He will continue the same  Essential hypertension He is not symptomatic.  Observe closely.  Recommend close follow-up with primary care doctor Could be element of whitecoat hypertension   No orders of the defined types were placed in this encounter.   INTERVAL HISTORY: Please see below for problem oriented charting. He feels well He quit smoking Denies recent alcohol intake No recent infection, fever or chills No new lymphadenopathy Denies recent choking  SUMMARY OF ONCOLOGIC HISTORY: Oncology History   Cancer of base of tongue, HPV negative   Primary site: Pharynx - Oropharynx   Staging method: AJCC 7th Edition   Clinical: Stage IVB (T2, N3, M0) signed by [name], MD on 10/15/2013 10:25 AM   Summary: Stage IVB (T2, N3, M0)       Cancer of base of tongue ([place])   09/17/2013 Imaging    CT scan of the neck showed large bilateral neck lymphadenopathy with associated tongue mass.    09/23/2013 Procedure    Fine needle aspirate of the left neck mass came back positive for squamous cell carcinoma.    10/04/2013 Surgery    Laryngoscopy showed a firm mass within the left tongue base extending past the midline and encompassing much of the tongue base.  There is extension of the mass into the vallecula and on to the lingual surface of the epiglottis.    10/04/2013 Pathology Results    Vallecula and tongue biopsy confirmed squamous cell carcinoma.    10/15/2013 Imaging    PET/CT scan showed no evidence of distant metastatic disease apart from just regional lymph node involvement.    10/25/2013 - 11/01/2013 Hospital Admission    He  was admitted to the hospital due to malignant hypercalcemia and treatment was initiated.    10/27/2013 - 12/13/2013 Chemotherapy    He was given 3 cycles of induction chemo with cisplatin, taxotere, 5 FU    11/03/2013 - 11/07/2013 Hospital Admission    He was hospitalized for dehydration    12/31/2013 Procedure    PICC line was removed    01/10/2014 Imaging    PET Ct scan showed near complete response to treatment    01/26/2014 - 03/18/2014 Radiation Therapy    Treated with helical IMRT tomotherapy:  Base of tongue and bilateral neck / 70 Gy in 35 fractions to gross disease, 63 Gy in 35 fractions to high risk nodal echelons, and 56 Gy in 35 fractions to intermediate risk nodal echelons.    07/14/2014 Imaging    CT scan show near complete response to treatment but new lung nodule    09/22/2014 Imaging    CT Neck - No new adenopathy or nodes with increasing size.  No evidence for residual or recurrent primary tumor.     09/22/2014 Imaging    CT Chest - Resolution of left upper lobe nodule, likely infectious or inflammatory.  No acute process or evidence of metastatic disease in chest.     12/23/2014 Imaging    CT Neck - Extensive radiation changes in the neck are stable.  6 mm calcified left level 3 node is stable. No new adenopathy or  mass.      REVIEW OF SYSTEMS:   Constitutional: Denies fevers, chills or abnormal weight loss Eyes: Denies blurriness of vision Ears, nose, mouth, throat, and face: Denies mucositis or sore throat Respiratory: Denies cough, dyspnea or wheezes Cardiovascular: Denies palpitation, chest discomfort or lower extremity swelling Gastrointestinal:  Denies nausea, heartburn or change in bowel habits Skin: Denies abnormal skin rashes Lymphatics: Denies new lymphadenopathy or easy bruising Neurological:Denies numbness, tingling or new weaknesses Behavioral/Psych: Mood is stable, no new changes  All other systems were reviewed with the patient and are negative.  I have  reviewed the past medical history, past surgical history, social history and family history with the patient and they are unchanged from previous note.  ALLERGIES:  has No Known Allergies.  MEDICATIONS:  Current Outpatient Medications  Medication Sig Dispense Refill  . levothyroxine (SYNTHROID, LEVOTHROID) 25 MCG tablet Take 1 tablet (25 mcg total) by mouth daily before breakfast. 90 tablet 0  . methocarbamol (ROBAXIN) 500 MG tablet Take 500 mg by mouth every 6 (six) hours as needed for muscle spasms.    . traMADol (ULTRAM) 50 MG tablet Take 50 mg by mouth every 6 (six) hours as needed.     No current facility-administered medications for this visit.     PHYSICAL EXAMINATION: ECOG PERFORMANCE STATUS: 0 - Asymptomatic  Vitals:   02/12/18 0957  BP: (!) 142/72  Pulse: 71  Resp: 17  Temp: 98.6 F (37 C)  SpO2: 99%   Filed Weights   02/12/18 0957  Weight: 126 lb 3.2 oz (57.2 kg)    GENERAL:alert, no distress and comfortable SKIN: skin color, texture, turgor are normal, no rashes or significant lesions EYES: normal, Conjunctiva are pink and non-injected, sclera clear OROPHARYNX:no exudate, no erythema and lips, buccal mucosa, and tongue normal  NECK: Difficult neck scarring from prior treatment LYMPH:  no palpable lymphadenopathy in the cervical, axillary or inguinal LUNGS: clear to auscultation and percussion with normal breathing effort HEART: regular rate & rhythm and no murmurs and no lower extremity edema ABDOMEN:abdomen soft, non-tender and normal bowel sounds Musculoskeletal:no cyanosis of digits and no clubbing  NEURO: alert & oriented x 3 with fluent speech, no focal motor/sensory deficits  LABORATORY DATA:  I have reviewed the data as listed    Component Value Date/Time   NA 139 02/12/2018 0943   NA 140 02/14/2017 0851   K 4.4 02/12/2018 0943   K 5.1 02/14/2017 0851   CL 104 02/12/2018 0943   CO2 27 02/12/2018 0943   CO2 27 02/14/2017 0851   GLUCOSE 142 (H)  02/12/2018 0943   GLUCOSE 117 02/14/2017 0851   BUN 9 02/12/2018 0943   BUN 7.9 02/14/2017 0851   CREATININE 1.09 02/12/2018 0943   CREATININE 0.9 02/14/2017 0851   CALCIUM 9.7 02/12/2018 0943   CALCIUM 9.7 02/14/2017 0851   PROT 7.3 02/12/2018 0943   PROT 7.4 02/14/2017 0851   ALBUMIN 4.1 02/12/2018 0943   ALBUMIN 4.3 02/14/2017 0851   AST 20 02/12/2018 0943   AST 18 02/14/2017 0851   ALT 12 02/12/2018 0943   ALT 9 02/14/2017 0851   ALKPHOS 80 02/12/2018 0943   ALKPHOS 59 02/14/2017 0851   BILITOT 0.5 02/12/2018 0943   BILITOT 0.33 02/14/2017 0851   GFRNONAA >60 02/12/2018 0943   GFRAA >60 02/12/2018 0943    No results found for: SPEP, UPEP  Lab Results  Component Value Date   WBC 10.1 02/12/2018   NEUTROABS 8.6 (H) 02/12/2018  HGB 13.8 02/12/2018   HCT 41.4 02/12/2018   MCV 97.4 02/12/2018   PLT 322 02/12/2018      Chemistry      Component Value Date/Time   NA 139 02/12/2018 0943   NA 140 02/14/2017 0851   K 4.4 02/12/2018 0943   K 5.1 02/14/2017 0851   CL 104 02/12/2018 0943   CO2 27 02/12/2018 0943   CO2 27 02/14/2017 0851   BUN 9 02/12/2018 0943   BUN 7.9 02/14/2017 0851   CREATININE 1.09 02/12/2018 0943   CREATININE 0.9 02/14/2017 0851      Component Value Date/Time   CALCIUM 9.7 02/12/2018 0943   CALCIUM 9.7 02/14/2017 0851   ALKPHOS 80 02/12/2018 0943   ALKPHOS 59 02/14/2017 0851   AST 20 02/12/2018 0943   AST 18 02/14/2017 0851   ALT 12 02/12/2018 0943   ALT 9 02/14/2017 0851   BILITOT 0.5 02/12/2018 0943   BILITOT 0.33 02/14/2017 0851       All questions were answered. The patient knows to call the clinic with any problems, questions or concerns. No barriers to learning was detected.  I spent 15 minutes counseling the patient face to face. The total time spent in the appointment was 20 minutes and more than 50% was on counseling and review of test results  [name], MD 02/12/2018 4:11 PM

## 2018-02-12 NOTE — Assessment & Plan Note (Signed)
TSH is within normal limits.  He will continue the same

## 2018-02-13 ENCOUNTER — Ambulatory Visit: Payer: Self-pay | Admitting: Hematology and Oncology

## 2018-02-13 ENCOUNTER — Other Ambulatory Visit: Payer: Self-pay

## 2018-04-01 DIAGNOSIS — C029 Malignant neoplasm of tongue, unspecified: Secondary | ICD-10-CM | POA: Diagnosis not present

## 2018-06-04 DIAGNOSIS — C029 Malignant neoplasm of tongue, unspecified: Secondary | ICD-10-CM | POA: Diagnosis not present

## 2018-06-04 DIAGNOSIS — Z1211 Encounter for screening for malignant neoplasm of colon: Secondary | ICD-10-CM | POA: Diagnosis not present

## 2018-06-04 DIAGNOSIS — Z0000 Encounter for general adult medical examination without abnormal findings: Secondary | ICD-10-CM | POA: Diagnosis not present

## 2018-06-04 DIAGNOSIS — E039 Hypothyroidism, unspecified: Secondary | ICD-10-CM | POA: Diagnosis not present

## 2018-06-04 DIAGNOSIS — R739 Hyperglycemia, unspecified: Secondary | ICD-10-CM | POA: Diagnosis not present

## 2018-06-04 DIAGNOSIS — M542 Cervicalgia: Secondary | ICD-10-CM | POA: Diagnosis not present

## 2018-09-01 DIAGNOSIS — Z85818 Personal history of malignant neoplasm of other sites of lip, oral cavity, and pharynx: Secondary | ICD-10-CM | POA: Diagnosis not present

## 2018-09-01 DIAGNOSIS — Z87891 Personal history of nicotine dependence: Secondary | ICD-10-CM | POA: Diagnosis not present

## 2018-09-23 ENCOUNTER — Other Ambulatory Visit: Payer: Self-pay | Admitting: Gastroenterology

## 2018-10-13 ENCOUNTER — Other Ambulatory Visit (HOSPITAL_COMMUNITY)
Admission: RE | Admit: 2018-10-13 | Discharge: 2018-10-13 | Disposition: A | Discharge status: Home / Self Care | Payer: No Typology Code available for payment source | Source: Ambulatory Visit | Attending: Gastroenterology | Admitting: Gastroenterology

## 2018-10-13 DIAGNOSIS — Z1159 Encounter for screening for other viral diseases: Secondary | ICD-10-CM | POA: Insufficient documentation

## 2018-10-14 LAB — NOVEL CORONAVIRUS, NAA (HOSP ORDER, SEND-OUT TO REF LAB; TAT 18-24 HRS): SARS-CoV-2, NAA: NOT DETECTED

## 2018-10-16 ENCOUNTER — Other Ambulatory Visit: Payer: Self-pay

## 2018-10-16 ENCOUNTER — Ambulatory Visit (HOSPITAL_COMMUNITY): Payer: No Typology Code available for payment source | Admitting: Certified Registered"

## 2018-10-16 ENCOUNTER — Ambulatory Visit (HOSPITAL_COMMUNITY)
Admission: RE | Admit: 2018-10-16 | Discharge: 2018-10-16 | Disposition: A | Discharge status: Home / Self Care | Payer: No Typology Code available for payment source | Attending: Gastroenterology | Admitting: Gastroenterology

## 2018-10-16 ENCOUNTER — Encounter (HOSPITAL_COMMUNITY): Payer: Self-pay

## 2018-10-16 ENCOUNTER — Encounter (HOSPITAL_COMMUNITY)
Admission: RE | Disposition: A | Discharge status: Home / Self Care | Payer: Self-pay | Source: Home / Self Care | Attending: Gastroenterology

## 2018-10-16 DIAGNOSIS — D649 Anemia, unspecified: Secondary | ICD-10-CM | POA: Diagnosis not present

## 2018-10-16 DIAGNOSIS — Z923 Personal history of irradiation: Secondary | ICD-10-CM | POA: Diagnosis not present

## 2018-10-16 DIAGNOSIS — Z85810 Personal history of malignant neoplasm of tongue: Secondary | ICD-10-CM | POA: Diagnosis not present

## 2018-10-16 DIAGNOSIS — F17210 Nicotine dependence, cigarettes, uncomplicated: Secondary | ICD-10-CM | POA: Insufficient documentation

## 2018-10-16 DIAGNOSIS — Z1211 Encounter for screening for malignant neoplasm of colon: Secondary | ICD-10-CM | POA: Diagnosis not present

## 2018-10-16 DIAGNOSIS — Z801 Family history of malignant neoplasm of trachea, bronchus and lung: Secondary | ICD-10-CM | POA: Insufficient documentation

## 2018-10-16 DIAGNOSIS — K5730 Diverticulosis of large intestine without perforation or abscess without bleeding: Secondary | ICD-10-CM | POA: Insufficient documentation

## 2018-10-16 DIAGNOSIS — G4700 Insomnia, unspecified: Secondary | ICD-10-CM | POA: Diagnosis not present

## 2018-10-16 HISTORY — PX: COLONOSCOPY WITH PROPOFOL: SHX5780

## 2018-10-16 SURGERY — COLONOSCOPY WITH PROPOFOL
Anesthesia: Monitor Anesthesia Care

## 2018-10-16 MED ORDER — PROPOFOL 10 MG/ML IV BOLUS
INTRAVENOUS | Status: DC | PRN
  Administered 2018-10-16: 10 mg via INTRAVENOUS
  Administered 2018-10-16 (×2): 20 mg via INTRAVENOUS

## 2018-10-16 MED ORDER — PROPOFOL 500 MG/50ML IV EMUL
INTRAVENOUS | Status: DC | PRN
  Administered 2018-10-16: 135 ug/kg/min via INTRAVENOUS

## 2018-10-16 MED ORDER — PROPOFOL 10 MG/ML IV BOLUS
INTRAVENOUS | Status: AC
  Filled 2018-10-16: qty 40

## 2018-10-16 MED ORDER — LACTATED RINGERS IV SOLN
INTRAVENOUS | Status: DC
  Administered 2018-10-16: 1000 mL via INTRAVENOUS

## 2018-10-16 MED ORDER — SODIUM CHLORIDE 0.9 % IV SOLN: INTRAVENOUS | Status: DC

## 2018-10-16 SURGICAL SUPPLY — 22 items

## 2018-10-16 NOTE — Anesthesia Procedure Notes (Signed)
Procedure Name: MAC Date/Time: 10/16/2018 8:58 AM Performed by: Niel Hummer, CRNA Pre-anesthesia Checklist: Patient identified, Emergency Drugs available, Suction available and Patient being monitored Patient Re-evaluated:Patient Re-evaluated prior to induction Oxygen Delivery Method: Simple face mask

## 2018-10-16 NOTE — H&P (Signed)
  [name] HPI: This is a 68 year old male with a PMH of lingual cancer s/p XRT here for a screening colonoscopy.  The patient denies any GI issues.  Past Medical History:  Diagnosis Date  . Constipation due to pain medication   . Current smoker   . Hypomagnesemia 11/12/2013  . Insomnia 11/12/2013  . Lymphedema 05/05/2014  . Malignant neoplasm of base of tongue ([facility])    left neck lymph and Left BOT cancer  . 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 S/P radiation therapy 01/26/2014-03/18/2014   base of tongue, bilateral neck/ 70 Gy/35 fx,   . Thrush of mouth and esophagus ([place]) 02/21/2014    Past Surgical History:  Procedure Laterality Date  . APPENDECTOMY    . BACK SURGERY     Lumbar  . ESOPHAGOSCOPY  10/04/2013   Procedure: ESOPHAGOSCOPY;  Surgeon: Melida Quitter, MD;  Location: [facility];  Service: ENT;;  . MANDIBLE SURGERY     from [place]  . MULTIPLE EXTRACTIONS WITH ALVEOLOPLASTY N/A 10/08/2013   Procedure: extraction of tooth #'s 2,3,4,5,6,11,12,13,14,15,18,19,20,21,22,23,24,25,26, 27,28, 29, 31 with alveoloplasty ;  Surgeon: Lenn Cal, DDS;  Location: [facility];  Service: Oral Surgery;  Laterality: N/A;  . PANENDOSCOPY N/A 10/04/2013   Procedure: Direct Laryngoscopy  WITH BIOPSY;  Surgeon: Melida Quitter, MD;  Location: [facility];  Service: ENT;  Laterality: N/A;  . TESTICLE SURGERY     as a infant  . TONSILLECTOMY      Family History  Problem Relation Age of Onset  . Cancer Mother        Lung  . Cancer Father        ? Lung    Social History:  reports that he has been smoking cigarettes. He has a 11.25 pack-year smoking history. He has never used smokeless tobacco. He reports current drug use. Drug: Marijuana. He reports that he does not drink alcohol.  Allergies: No Known Allergies  Medications:  Scheduled:  Continuous: . sodium chloride    . lactated ringers 1,000 mL (10/16/18 0829)    No  results found for this or any previous visit (from the past 24 hour(s)).   No results found.  ROS:  As stated above in the HPI otherwise negative.  Blood pressure (!) 145/86, pulse (!) 52, temperature 98.2 F (36.8 C), temperature source Oral, resp. rate 12, height 5\' 5"  (1.651 m), weight 56.7 kg, SpO2 99 %.    PE: Gen: NAD, Alert and Oriented HEENT:  [place]/AT, EOMI Neck: Supple, no LAD Lungs: CTA Bilaterally CV: RRR without M/G/R ABM: Soft, NTND, +BS Ext: No C/C/E  Assessment/Plan: 1) Screening colonoscopy.  Avielle Imbert D 10/16/2018, 8:50 AM

## 2018-10-16 NOTE — Discharge Instructions (Signed)

## 2018-10-16 NOTE — Transfer of Care (Signed)
Immediate Anesthesia Transfer of Care Note  Patient: Angel [name]  Procedure(s) Performed: COLONOSCOPY WITH PROPOFOL (N/A )  Patient Location: PACU  Anesthesia Type:MAC  Level of Consciousness: awake  Airway & Oxygen Therapy: Patient Spontanous Breathing and Patient connected to face mask oxygen  Post-op Assessment: Report given to RN and Post -op Vital signs reviewed and stable  Post vital signs: Reviewed and stable  Last Vitals:  Vitals Value Taken Time  BP    Temp    Pulse    Resp    SpO2      Last Pain:  Vitals:   10/16/18 0825  TempSrc: Oral  PainSc: 0-No pain         Complications: No apparent anesthesia complications

## 2018-10-16 NOTE — Anesthesia Postprocedure Evaluation (Signed)
Anesthesia Post Note  Patient: Angel [name]  Procedure(s) Performed: COLONOSCOPY WITH PROPOFOL (N/A )     Patient location during evaluation: PACU Anesthesia Type: MAC Level of consciousness: awake and alert Pain management: pain level controlled Vital Signs Assessment: post-procedure vital signs reviewed and stable Respiratory status: spontaneous breathing, nonlabored ventilation and respiratory function stable Cardiovascular status: stable and blood pressure returned to baseline Postop Assessment: no apparent nausea or vomiting Anesthetic complications: no    Last Vitals:  Vitals:   10/16/18 0825 10/16/18 0931  BP: (!) 145/86 111/67  Pulse: (!) 52 (!) 57  Resp: 12 18  Temp: 36.8 C 36.5 C  SpO2: 99% 100%    Last Pain:  Vitals:   10/16/18 0931  TempSrc: Temporal  PainSc:                  Lynda Rainwater

## 2018-10-16 NOTE — Anesthesia Preprocedure Evaluation (Signed)
Anesthesia Evaluation  Patient identified by MRN, date of birth, ID band Patient awake    Reviewed: Allergy & Precautions, H&P , NPO status , Patient's Chart, lab work & pertinent test results, reviewed documented beta blocker date and time   Airway Mallampati: III  TM Distance: >3 FB Neck ROM: Full    Dental  (+) Dental Advisory Given, Poor Dentition, Missing, Loose   Pulmonary Current Smoker,    breath sounds clear to auscultation       Cardiovascular negative cardio ROS   Rhythm:Regular     Neuro/Psych    GI/Hepatic negative GI ROS, Neg liver ROS,   Endo/Other  negative endocrine ROS  Renal/GU negative Renal ROS     Musculoskeletal negative musculoskeletal ROS (+)   Abdominal (+)  Abdomen: soft. Bowel sounds: normal.  Peds  Hematology  (+) anemia ,   Anesthesia Other Findings   Reproductive/Obstetrics negative OB ROS                             Anesthesia Physical  Anesthesia Plan  ASA: II  Anesthesia Plan: MAC   Post-op Pain Management:    Induction: Intravenous  PONV Risk Score and Plan: Treatment may vary due to age or medical condition  Airway Management Planned: Nasal Cannula  Additional Equipment:   Intra-op Plan:   Post-operative Plan:   Informed Consent: I have reviewed the patients History and Physical, chart, labs and discussed the procedure including the risks, benefits and alternatives for the proposed anesthesia with the patient or authorized representative who has indicated his/her understanding and acceptance.       Plan Discussed with: CRNA and Surgeon  Anesthesia Plan Comments:         Anesthesia Quick Evaluation

## 2018-10-16 NOTE — Op Note (Signed)
[facility] Patient Name: Angel [name] Procedure Date: 10/16/2018 MRN: [id number] Attending MD: Carol Ada , MD Date of Birth: 1950/09/14 CSN: [id number] Age: 68 Admit Type: Outpatient Procedure:                Colonoscopy Indications:              Screening for colorectal malignant neoplasm Providers:                Carol Ada, MD, Cleda Daub, RN, Elspeth Cho                            Tech., Technician, Caryl Pina, CRNA Referring MD:              Medicines:                Propofol per Anesthesia Complications:            No immediate complications. Estimated Blood Loss:     Estimated blood loss: none. Procedure:                Pre-Anesthesia Assessment:                           - Prior to the procedure, a History and Physical                            was performed, and patient medications and                            allergies were reviewed. The patient's tolerance of                            previous anesthesia was also reviewed. The risks                            and benefits of the procedure and the sedation                            options and risks were discussed with the patient.                            All questions were answered, and informed consent                            was obtained. Prior Anticoagulants: The patient has                            taken no previous anticoagulant or antiplatelet                            agents. ASA Grade Assessment: II - A patient with                            mild systemic disease. After reviewing the risks  and benefits, the patient was deemed in                            satisfactory condition to undergo the procedure.                           - Sedation was administered by an anesthesia                            professional. Deep sedation was attained.                           After obtaining informed consent, the colonoscope     was passed under direct vision. Throughout the                            procedure, the patient's blood pressure, pulse, and                            oxygen saturations were monitored continuously. The                            [id number]-[id number] ([id number] Olympus pediatric colonscope                            was introduced through the anus and advanced to the                            the cecum, identified by appendiceal orifice and                            ileocecal valve. The colonoscopy was performed                            without difficulty. The patient tolerated the                            procedure well. The quality of the bowel                            preparation was excellent. The ileocecal valve,                            appendiceal orifice, and rectum were photographed. Scope In: 9:07:34 AM Scope Out: 9:25:33 AM Scope Withdrawal Time: 0 hours 11 minutes 49 seconds  Total Procedure Duration: 0 hours 17 minutes 59 seconds  Findings:      Scattered small and large-mouthed diverticula were found in the sigmoid       colon. Impression:               - Diverticulosis in the sigmoid colon.                           - No specimens collected. Moderate Sedation:      Not Applicable - Patient had care per Anesthesia.  Recommendation:           - Patient has a contact number available for                            emergencies. The signs and symptoms of potential                            delayed complications were discussed with the                            patient. Return to normal activities tomorrow.                            Written discharge instructions were provided to the                            patient.                           - Resume previous diet.                           - Continue present medications.                           - Repeat colonoscopy in 10 years for surveillance. Procedure Code(s):        --- Professional ---                            [phone], Colonoscopy, flexible; diagnostic, including                            collection of specimen(s) by brushing or washing,                            when performed (separate procedure) Diagnosis Code(s):        --- Professional ---                           Z12.11, Encounter for screening for malignant                            neoplasm of colon                           K57.30, Diverticulosis of large intestine without                            perforation or abscess without bleeding CPT copyright 2019 [employer]. All rights reserved. The codes documented in this report are preliminary and upon coder review may  be revised to meet current compliance requirements. Carol Ada, MD Carol Ada, MD 10/16/2018 9:32:20 AM This report has been signed electronically. Number of Addenda: 0

## 2018-10-20 ENCOUNTER — Encounter (HOSPITAL_COMMUNITY): Payer: Self-pay | Admitting: Gastroenterology

## 2018-12-03 DIAGNOSIS — E785 Hyperlipidemia, unspecified: Secondary | ICD-10-CM | POA: Diagnosis not present

## 2018-12-03 DIAGNOSIS — Z0000 Encounter for general adult medical examination without abnormal findings: Secondary | ICD-10-CM | POA: Diagnosis not present

## 2018-12-03 DIAGNOSIS — R739 Hyperglycemia, unspecified: Secondary | ICD-10-CM | POA: Diagnosis not present

## 2018-12-03 DIAGNOSIS — E039 Hypothyroidism, unspecified: Secondary | ICD-10-CM | POA: Diagnosis not present

## 2018-12-03 DIAGNOSIS — M542 Cervicalgia: Secondary | ICD-10-CM | POA: Diagnosis not present

## 2019-02-12 ENCOUNTER — Other Ambulatory Visit: Payer: Self-pay | Admitting: Hematology and Oncology

## 2019-02-12 DIAGNOSIS — C01 Malignant neoplasm of base of tongue: Secondary | ICD-10-CM

## 2019-02-12 DIAGNOSIS — E039 Hypothyroidism, unspecified: Secondary | ICD-10-CM

## 2019-02-15 ENCOUNTER — Other Ambulatory Visit: Payer: Self-pay

## 2019-02-15 ENCOUNTER — Telehealth: Payer: Self-pay | Admitting: *Deleted

## 2019-02-15 ENCOUNTER — Other Ambulatory Visit: Payer: Self-pay | Admitting: Hematology and Oncology

## 2019-02-15 ENCOUNTER — Inpatient Hospital Stay: Payer: No Typology Code available for payment source | Attending: Hematology and Oncology

## 2019-02-15 ENCOUNTER — Inpatient Hospital Stay (HOSPITAL_BASED_OUTPATIENT_CLINIC_OR_DEPARTMENT_OTHER): Payer: No Typology Code available for payment source | Admitting: Hematology and Oncology

## 2019-02-15 ENCOUNTER — Encounter: Payer: Self-pay | Admitting: Hematology and Oncology

## 2019-02-15 DIAGNOSIS — C01 Malignant neoplasm of base of tongue: Secondary | ICD-10-CM

## 2019-02-15 DIAGNOSIS — E039 Hypothyroidism, unspecified: Secondary | ICD-10-CM

## 2019-02-15 DIAGNOSIS — Z85810 Personal history of malignant neoplasm of tongue: Secondary | ICD-10-CM | POA: Insufficient documentation

## 2019-02-15 LAB — CBC WITH DIFFERENTIAL/PLATELET
Abs Immature Granulocytes: 0.09 10*3/uL — ABNORMAL HIGH (ref 0.00–0.07)
Basophils Absolute: 0.1 10*3/uL (ref 0.0–0.1)
Basophils Relative: 2 %
Eosinophils Absolute: 0.5 10*3/uL (ref 0.0–0.5)
Eosinophils Relative: 6 %
HCT: 41.2 % (ref 39.0–52.0)
Hemoglobin: 13.9 g/dL (ref 13.0–17.0)
Immature Granulocytes: 1 %
Lymphocytes Relative: 14 %
Lymphs Abs: 1.2 10*3/uL (ref 0.7–4.0)
MCH: 32.5 pg (ref 26.0–34.0)
MCHC: 33.7 g/dL (ref 30.0–36.0)
MCV: 96.3 fL (ref 80.0–100.0)
Monocytes Absolute: 0.8 10*3/uL (ref 0.1–1.0)
Monocytes Relative: 9 %
Neutro Abs: 5.9 10*3/uL (ref 1.7–7.7)
Neutrophils Relative %: 68 %
Platelets: 279 10*3/uL (ref 150–400)
RBC: 4.28 MIL/uL (ref 4.22–5.81)
RDW: 14.4 % (ref 11.5–15.5)
WBC: 8.6 10*3/uL (ref 4.0–10.5)
nRBC: 0 % (ref 0.0–0.2)

## 2019-02-15 LAB — COMPREHENSIVE METABOLIC PANEL
ALT: 13 U/L (ref 0–44)
AST: 21 U/L (ref 15–41)
Albumin: 4.3 g/dL (ref 3.5–5.0)
Alkaline Phosphatase: 57 U/L (ref 38–126)
Anion gap: 10 (ref 5–15)
BUN: 11 mg/dL (ref 8–23)
CO2: 27 mmol/L (ref 22–32)
Calcium: 9.5 mg/dL (ref 8.9–10.3)
Chloride: 103 mmol/L (ref 98–111)
Creatinine, Ser: 1.11 mg/dL (ref 0.61–1.24)
GFR calc Af Amer: >60 mL/min (ref >60)
GFR calc non Af Amer: >60 mL/min (ref >60)
Glucose, Bld: 111 mg/dL — ABNORMAL HIGH (ref 70–99)
Potassium: 4.5 mmol/L (ref 3.5–5.1)
Sodium: 140 mmol/L (ref 135–145)
Total Bilirubin: 0.4 mg/dL (ref 0.3–1.2)
Total Protein: 7 g/dL (ref 6.5–8.1)

## 2019-02-15 LAB — TSH: TSH: 5.481 u[IU]/mL — ABNORMAL HIGH (ref 0.320–4.118)

## 2019-02-15 MED ORDER — LEVOTHYROXINE SODIUM 50 MCG PO TABS: 50.0000 ug | ORAL_TABLET | Freq: Every day | ORAL | 0 refills | Status: DC

## 2019-02-15 NOTE — Assessment & Plan Note (Signed)
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 His TSH is elevated The patient has been compliant taking levothyroxine I plan to increase the dose a little bit I recommend he schedule follow-up appointment with his primary care doctor within the next 3 months for medication adjustment

## 2019-02-15 NOTE — Progress Notes (Signed)
[facility] OFFICE PROGRESS NOTE  Patient Care Team: Jani Gravel, MD as PCP - General (Internal Medicine)  ASSESSMENT & PLAN:  Cancer of base of tongue [facility]) He is a long-term cancer survivor He has no signs of cancer recurrence I will discharge him from the clinic I continue to remind the patient not to smoke or drink in the future  Acquired hypothyroidism 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 His TSH is elevated The patient has been compliant taking levothyroxine I plan to increase the dose a little bit I recommend he schedule follow-up appointment with his primary care doctor within the next 3 months for medication adjustment   No orders of the defined types were placed in this encounter.   INTERVAL HISTORY: Please see below for problem oriented charting. He returns for further follow-up He is feeling well No recent oral lesions No new lumps of bumps in his neck Denies swallowing difficulties He is compliant taking his medications as directed He denies recent smoking or alcohol intake  SUMMARY OF ONCOLOGIC HISTORY: Oncology History Overview Note  Cancer of base of tongue, HPV negative   Primary site: Pharynx - Oropharynx   Staging method: AJCC 7th Edition   Clinical: Stage IVB (T2, N3, M0) signed by [name], MD on 10/15/2013 10:25 AM   Summary: Stage IVB (T2, N3, M0)     Cancer of base of tongue ([place])  09/17/2013 Imaging   CT scan of the neck showed large bilateral neck lymphadenopathy with associated tongue mass.   09/23/2013 Procedure   Fine needle aspirate of the left neck mass came back positive for squamous cell carcinoma.   10/04/2013 Surgery   Laryngoscopy showed a firm mass within the left tongue base extending past the midline and encompassing much of the tongue base.  There is extension of the mass into the vallecula and on to the lingual surface of the epiglottis.   10/04/2013 Pathology Results   Vallecula and tongue biopsy  confirmed squamous cell carcinoma.   10/15/2013 Imaging   PET/CT scan showed no evidence of distant metastatic disease apart from just regional lymph node involvement.   10/25/2013 - 11/01/2013 Hospital Admission   He was admitted to the hospital due to malignant hypercalcemia and treatment was initiated.   10/27/2013 - 12/13/2013 Chemotherapy   He was given 3 cycles of induction chemo with cisplatin, taxotere, 5 FU   11/03/2013 - 11/07/2013 Hospital Admission   He was hospitalized for dehydration   12/31/2013 Procedure   PICC line was removed   01/10/2014 Imaging   PET Ct scan showed near complete response to treatment   01/26/2014 - 03/18/2014 Radiation Therapy   Treated with helical IMRT tomotherapy:  Base of tongue and bilateral neck / 70 Gy in 35 fractions to gross disease, 63 Gy in 35 fractions to high risk nodal echelons, and 56 Gy in 35 fractions to intermediate risk nodal echelons.   07/14/2014 Imaging   CT scan show near complete response to treatment but new lung nodule   09/22/2014 Imaging   CT Neck - No new adenopathy or nodes with increasing size.  No evidence for residual or recurrent primary tumor.    09/22/2014 Imaging   CT Chest - Resolution of left upper lobe nodule, likely infectious or inflammatory.  No acute process or evidence of metastatic disease in chest.    12/23/2014 Imaging   CT Neck - Extensive radiation changes in the neck are stable.  6 mm calcified left level  3 node is stable. No new adenopathy or mass.      REVIEW OF SYSTEMS:   Constitutional: Denies fevers, chills or abnormal weight loss Eyes: Denies blurriness of vision Ears, nose, mouth, throat, and face: Denies mucositis or sore throat Respiratory: Denies cough, dyspnea or wheezes Cardiovascular: Denies palpitation, chest discomfort or lower extremity swelling Gastrointestinal:  Denies nausea, heartburn or change in bowel habits Skin: Denies abnormal skin rashes Lymphatics: Denies new lymphadenopathy  or easy bruising Neurological:Denies numbness, tingling or new weaknesses Behavioral/Psych: Mood is stable, no new changes  All other systems were reviewed with the patient and are negative.  I have reviewed the past medical history, past surgical history, social history and family history with the patient and they are unchanged from previous note.  ALLERGIES:  has No Known Allergies.  MEDICATIONS:  Current Outpatient Medications  Medication Sig Dispense Refill  . levothyroxine (SYNTHROID) 50 MCG tablet Take 1 tablet (50 mcg total) by mouth daily before breakfast. 90 tablet 0  . methocarbamol (ROBAXIN) 500 MG tablet Take 500 mg by mouth every 4 (four) hours as needed for muscle spasms.     . Multiple Vitamin (MULTIVITAMIN WITH MINERALS) TABS tablet Take 1 tablet by mouth daily.    . traMADol (ULTRAM) 50 MG tablet Take 50 mg by mouth every 4 (four) hours as needed.     Marland Kitchen VITAMIN D PO Take 1 tablet by mouth daily.     No current facility-administered medications for this visit.     PHYSICAL EXAMINATION: ECOG PERFORMANCE STATUS: 0 - Asymptomatic  Vitals:   02/15/19 0835  BP: 135/85  Pulse: (!) 57  Resp: 18  Temp: 98.2 F (36.8 C)  SpO2: 98%   Filed Weights   02/15/19 0835  Weight: 132 lb 12.8 oz (60.2 kg)    GENERAL:alert, no distress and comfortable SKIN: skin color, texture, turgor are normal, no rashes or significant lesions EYES: normal, Conjunctiva are pink and non-injected, sclera clear OROPHARYNX:no exudate, no erythema and lips, buccal mucosa, and tongue normal  NECK: supple, thyroid normal size, non-tender, without nodularity LYMPH: His neck is fibrosed from prior radiation LUNGS: clear to auscultation and percussion with normal breathing effort HEART: regular rate & rhythm and no murmurs and no lower extremity edema ABDOMEN:abdomen soft, non-tender and normal bowel sounds Musculoskeletal:no cyanosis of digits and no clubbing  NEURO: alert & oriented x 3 with  fluent speech, no focal motor/sensory deficits  LABORATORY DATA:  I have reviewed the data as listed    Component Value Date/Time   NA 140 02/15/2019 0824   NA 140 02/14/2017 0851   K 4.5 02/15/2019 0824   K 5.1 02/14/2017 0851   CL 103 02/15/2019 0824   CO2 27 02/15/2019 0824   CO2 27 02/14/2017 0851   GLUCOSE 111 (H) 02/15/2019 0824   GLUCOSE 117 02/14/2017 0851   BUN 11 02/15/2019 0824   BUN 7.9 02/14/2017 0851   CREATININE 1.11 02/15/2019 0824   CREATININE 1.09 02/12/2018 0943   CREATININE 0.9 02/14/2017 0851   CALCIUM 9.5 02/15/2019 0824   CALCIUM 9.7 02/14/2017 0851   PROT 7.0 02/15/2019 0824   PROT 7.4 02/14/2017 0851   ALBUMIN 4.3 02/15/2019 0824   ALBUMIN 4.3 02/14/2017 0851   AST 21 02/15/2019 0824   AST 20 02/12/2018 0943   AST 18 02/14/2017 0851   ALT 13 02/15/2019 0824   ALT 12 02/12/2018 0943   ALT 9 02/14/2017 0851   ALKPHOS 57 02/15/2019 0824   ALKPHOS  59 02/14/2017 0851   BILITOT 0.4 02/15/2019 0824   BILITOT 0.5 02/12/2018 0943   BILITOT 0.33 02/14/2017 0851   GFRNONAA >60 02/15/2019 0824   GFRNONAA >60 02/12/2018 0943   GFRAA >60 02/15/2019 0824   GFRAA >60 02/12/2018 0943    No results found for: SPEP, UPEP  Lab Results  Component Value Date   WBC 8.6 02/15/2019   NEUTROABS 5.9 02/15/2019   HGB 13.9 02/15/2019   HCT 41.2 02/15/2019   MCV 96.3 02/15/2019   PLT 279 02/15/2019      Chemistry      Component Value Date/Time   NA 140 02/15/2019 0824   NA 140 02/14/2017 0851   K 4.5 02/15/2019 0824   K 5.1 02/14/2017 0851   CL 103 02/15/2019 0824   CO2 27 02/15/2019 0824   CO2 27 02/14/2017 0851   BUN 11 02/15/2019 0824   BUN 7.9 02/14/2017 0851   CREATININE 1.11 02/15/2019 0824   CREATININE 1.09 02/12/2018 0943   CREATININE 0.9 02/14/2017 0851      Component Value Date/Time   CALCIUM 9.5 02/15/2019 0824   CALCIUM 9.7 02/14/2017 0851   ALKPHOS 57 02/15/2019 0824   ALKPHOS 59 02/14/2017 0851   AST 21 02/15/2019 0824   AST 20  02/12/2018 0943   AST 18 02/14/2017 0851   ALT 13 02/15/2019 0824   ALT 12 02/12/2018 0943   ALT 9 02/14/2017 0851   BILITOT 0.4 02/15/2019 0824   BILITOT 0.5 02/12/2018 0943   BILITOT 0.33 02/14/2017 0851      All questions were answered. The patient knows to call the clinic with any problems, questions or concerns. No barriers to learning was detected.  I spent 10 minutes counseling the patient face to face. The total time spent in the appointment was 15 minutes and more than 50% was on counseling and review of test results  [name], MD 02/15/2019 10:28 AM

## 2019-02-15 NOTE — Telephone Encounter (Signed)
Telephone call to patient and discussed lab results as directed below. Patient verbalized an understanding and will call to schedule PCP visit for further follow up. Patient repeated back instructions to take thyroid medication on an empty stomach.

## 2019-02-15 NOTE — Assessment & Plan Note (Signed)
He is a long-term cancer survivor He has no signs of cancer recurrence I will discharge him from the clinic I continue to remind the patient not to smoke or drink in the future

## 2019-02-15 NOTE — Telephone Encounter (Signed)
-----   Message from Rennis Harding, RN sent at 02/15/2019  9:39 AM EDT ----- Regarding: FW: thyroid test  ----- Message ----- From: [name], MD Sent: 02/15/2019   9:30 AM EDT To: Rennis Harding, RN Subject: thyroid test                                   Pls call him The result of his TSH is high meaning the dose of his thyroid medicine needs to be increased I sent the new prescription to his pharmacy I advise him to call his PCP to get labs check within next 3 months

## 2019-02-24 DIAGNOSIS — H353123 Nonexudative age-related macular degeneration, left eye, advanced atrophic without subfoveal involvement: Secondary | ICD-10-CM | POA: Diagnosis not present

## 2019-02-24 DIAGNOSIS — H2513 Age-related nuclear cataract, bilateral: Secondary | ICD-10-CM | POA: Diagnosis not present

## 2019-02-24 DIAGNOSIS — H25013 Cortical age-related cataract, bilateral: Secondary | ICD-10-CM | POA: Diagnosis not present

## 2019-02-24 DIAGNOSIS — H524 Presbyopia: Secondary | ICD-10-CM | POA: Diagnosis not present

## 2019-05-06 ENCOUNTER — Other Ambulatory Visit: Payer: Self-pay | Admitting: Hematology and Oncology

## 2019-05-12 ENCOUNTER — Other Ambulatory Visit: Payer: Self-pay | Admitting: Hematology and Oncology

## 2019-05-12 ENCOUNTER — Telehealth: Payer: Self-pay

## 2019-05-12 NOTE — Telephone Encounter (Signed)
-----   Message from [name], MD sent at 05/12/2019 12:08 PM EST ----- Regarding: refill I am still getting refill request despite getting request From our previous conversation, I asked the patient to see PCP for refills Can you call and verify he has PCP to refill?

## 2019-05-12 NOTE — Telephone Encounter (Signed)
Called and given below message. He verbalized understanding. He is scheduled to see PCP, Dr. Maudie Mercury next week. He requested last office note faxed to Dr. Maudie Mercury. Office note faxed to [phone], received confirmation.

## 2019-06-03 DIAGNOSIS — Z125 Encounter for screening for malignant neoplasm of prostate: Secondary | ICD-10-CM | POA: Diagnosis not present

## 2019-06-03 DIAGNOSIS — R739 Hyperglycemia, unspecified: Secondary | ICD-10-CM | POA: Diagnosis not present

## 2019-06-03 DIAGNOSIS — E039 Hypothyroidism, unspecified: Secondary | ICD-10-CM | POA: Diagnosis not present

## 2019-06-03 DIAGNOSIS — E785 Hyperlipidemia, unspecified: Secondary | ICD-10-CM | POA: Diagnosis not present

## 2019-06-10 DIAGNOSIS — Z0000 Encounter for general adult medical examination without abnormal findings: Secondary | ICD-10-CM | POA: Diagnosis not present

## 2019-06-10 DIAGNOSIS — E039 Hypothyroidism, unspecified: Secondary | ICD-10-CM | POA: Diagnosis not present

## 2019-06-10 DIAGNOSIS — R739 Hyperglycemia, unspecified: Secondary | ICD-10-CM | POA: Diagnosis not present

## 2019-06-10 DIAGNOSIS — E785 Hyperlipidemia, unspecified: Secondary | ICD-10-CM | POA: Diagnosis not present

## 2019-08-12 ENCOUNTER — Other Ambulatory Visit: Payer: Self-pay

## 2019-08-12 ENCOUNTER — Encounter (HOSPITAL_COMMUNITY): Payer: Self-pay

## 2019-08-12 ENCOUNTER — Emergency Department (HOSPITAL_COMMUNITY)
Admission: EM | Admit: 2019-08-12 | Discharge: 2019-08-12 | Disposition: A | Discharge status: Home / Self Care | Payer: Medicare HMO | Attending: Emergency Medicine | Admitting: Emergency Medicine

## 2019-08-12 ENCOUNTER — Emergency Department (HOSPITAL_COMMUNITY): Payer: Medicare HMO

## 2019-08-12 DIAGNOSIS — M545 Low back pain, unspecified: Secondary | ICD-10-CM

## 2019-08-12 DIAGNOSIS — M5489 Other dorsalgia: Secondary | ICD-10-CM | POA: Diagnosis not present

## 2019-08-12 DIAGNOSIS — I10 Essential (primary) hypertension: Secondary | ICD-10-CM | POA: Diagnosis not present

## 2019-08-12 DIAGNOSIS — F17210 Nicotine dependence, cigarettes, uncomplicated: Secondary | ICD-10-CM | POA: Diagnosis not present

## 2019-08-12 DIAGNOSIS — R52 Pain, unspecified: Secondary | ICD-10-CM | POA: Diagnosis not present

## 2019-08-12 LAB — CBC
HCT: 43.8 % (ref 39.0–52.0)
Hemoglobin: 14.7 g/dL (ref 13.0–17.0)
MCH: 32.7 pg (ref 26.0–34.0)
MCHC: 33.6 g/dL (ref 30.0–36.0)
MCV: 97.6 fL (ref 80.0–100.0)
Platelets: 322 10*3/uL (ref 150–400)
RBC: 4.49 MIL/uL (ref 4.22–5.81)
RDW: 14.6 % (ref 11.5–15.5)
WBC: 10.7 10*3/uL — ABNORMAL HIGH (ref 4.0–10.5)
nRBC: 0 % (ref 0.0–0.2)

## 2019-08-12 LAB — BASIC METABOLIC PANEL
Anion gap: 8 (ref 5–15)
BUN: 9 mg/dL (ref 8–23)
CO2: 24 mmol/L (ref 22–32)
Calcium: 8.9 mg/dL (ref 8.9–10.3)
Chloride: 106 mmol/L (ref 98–111)
Creatinine, Ser: 0.86 mg/dL (ref 0.61–1.24)
GFR calc Af Amer: >60 mL/min (ref >60)
GFR calc non Af Amer: >60 mL/min (ref >60)
Glucose, Bld: 103 mg/dL — ABNORMAL HIGH (ref 70–99)
Potassium: 3.7 mmol/L (ref 3.5–5.1)
Sodium: 138 mmol/L (ref 135–145)

## 2019-08-12 MED ORDER — LIDOCAINE 5 % EX PTCH: 1.0000 | MEDICATED_PATCH | CUTANEOUS | 0 refills | Status: DC

## 2019-08-12 MED ORDER — CYCLOBENZAPRINE HCL 10 MG PO TABS: 10.0000 mg | ORAL_TABLET | Freq: Two times a day (BID) | ORAL | 0 refills | Status: DC | PRN

## 2019-08-12 MED ORDER — DIAZEPAM 5 MG PO TABS: 5.0000 mg | ORAL_TABLET | Freq: Two times a day (BID) | ORAL | 0 refills | Status: DC | PRN

## 2019-08-12 MED ORDER — METHOCARBAMOL 500 MG PO TABS
1000.0000 mg | ORAL_TABLET | Freq: Once | ORAL | Status: AC
  Administered 2019-08-12: 20:00:00 1000 mg via ORAL
  Filled 2019-08-12: qty 2

## 2019-08-12 MED ORDER — MIDAZOLAM HCL 2 MG/2ML IJ SOLN
2.0000 mg | Freq: Once | INTRAMUSCULAR | Status: AC
  Administered 2019-08-12: 2 mg via INTRAMUSCULAR
  Filled 2019-08-12: qty 2

## 2019-08-12 MED ORDER — ACETAMINOPHEN 500 MG PO TABS
1000.0000 mg | ORAL_TABLET | Freq: Once | ORAL | Status: AC
  Administered 2019-08-12: 20:00:00 1000 mg via ORAL
  Filled 2019-08-12: qty 2

## 2019-08-12 MED ORDER — OXYCODONE HCL 5 MG PO TABS
5.0000 mg | ORAL_TABLET | Freq: Once | ORAL | Status: AC
  Administered 2019-08-12: 20:00:00 5 mg via ORAL
  Filled 2019-08-12: qty 1

## 2019-08-12 NOTE — ED Provider Notes (Signed)
[facility] Emergency Department Provider Note MRN:  [id number]  Arrival date & time: 08/12/19     Chief Complaint   Back Pain   History of Present Illness   ZOREN BIRD is a 69 y.o. year-old male with a history of oral cancer presenting to the ED with chief complaint of neck pain.  Long-term back pain but worse over the past 3 days after doing some yard work.  Denies bowel or bladder dysfunction, no numbness or weakness to the arms or legs.  No trauma.  No headache or vision change, no chest pain or shortness of breath, no abdominal pain.  A lot of trouble walking today due to the pain.  Pain is severe, constant, worse with motion.  Review of Systems  A complete 10 system review of systems was obtained and all systems are negative except as noted in the HPI and PMH.   Patient's Health History    Past Medical History:  Diagnosis Date  . Constipation due to pain medication   . Current smoker   . Hypomagnesemia 11/12/2013  . Insomnia 11/12/2013  . Lymphedema 05/05/2014  . Malignant neoplasm of base of tongue ([facility])    left neck lymph and Left BOT cancer  . 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 S/P radiation therapy 01/26/2014-03/18/2014   base of tongue, bilateral neck/ 70 Gy/35 fx,   . Thrush of mouth and esophagus ([place]) 02/21/2014    Past Surgical History:  Procedure Laterality Date  . APPENDECTOMY    . BACK SURGERY     Lumbar  . COLONOSCOPY WITH PROPOFOL N/A 10/16/2018   Procedure: COLONOSCOPY WITH PROPOFOL;  Surgeon: Carol Ada, MD;  Location: [facility];  Service: Endoscopy;  Laterality: N/A;  . ESOPHAGOSCOPY  10/04/2013   Procedure: ESOPHAGOSCOPY;  Surgeon: Melida Quitter, MD;  Location: [facility];  Service: ENT;;  . MANDIBLE SURGERY     from [place]  . MULTIPLE EXTRACTIONS WITH ALVEOLOPLASTY N/A 10/08/2013   Procedure: extraction of tooth #'s  2,3,4,5,6,11,12,13,14,15,18,19,20,21,22,23,24,25,26, 27,28, 29, 31 with alveoloplasty ;  Surgeon: Lenn Cal, DDS;  Location: [facility];  Service: Oral Surgery;  Laterality: N/A;  . PANENDOSCOPY N/A 10/04/2013   Procedure: Direct Laryngoscopy  WITH BIOPSY;  Surgeon: Melida Quitter, MD;  Location: [facility];  Service: ENT;  Laterality: N/A;  . TESTICLE SURGERY     as a infant  . TONSILLECTOMY      Family History  Problem Relation Age of Onset  . Cancer Mother        Lung  . Cancer Father        ? Lung    Social History   Socioeconomic History  . Marital status: Married    Spouse name: Not on file  . Number of children: 1  . Years of education: Not on file  . Highest education level: Not on file  Occupational History    Employer: [employer]  Tobacco Use  . Smoking status: Current Every Day Smoker    Packs/day: 0.25    Years: 45.00    Pack years: 11.25    Types: Cigarettes  . Smokeless tobacco: Never Used  . Tobacco comment: reports using 2 cigarettes a day  Substance and Sexual Activity  . Alcohol use: No    Comment: "Heavy" Alcohol Use in the Past  . Drug use: Yes    Types: Marijuana  . Sexual  activity: Not on file  Other Topics Concern  . Not on file  Social History Narrative  . Not on file   Social Determinants of Health   Financial Resource Strain:   . Difficulty of Paying Living Expenses:   Food Insecurity:   . Worried About Charity fundraiser in the Last Year:   . Arboriculturist in the Last Year:   Transportation Needs:   . Film/video editor (Medical):   Marland Kitchen Lack of Transportation (Non-Medical):   Physical Activity:   . Days of Exercise per Week:   . Minutes of Exercise per Session:   Stress:   . Feeling of Stress :   Social Connections:   . Frequency of Communication with Friends and Family:   . Frequency of Social Gatherings with Friends and Family:   . Attends Religious Services:   . Active Member of Clubs or Organizations:   . Attends Theatre manager Meetings:   Marland Kitchen Marital Status:   Intimate Partner Violence:   . Fear of Current or Ex-Partner:   . Emotionally Abused:   Marland Kitchen Physically Abused:   . Sexually Abused:      Physical Exam   Vitals:   08/12/19 1919 08/12/19 2030  BP: (!) 177/85 (!) 161/81  Pulse: 66 64  Resp: 13 13  Temp: 98.4 F (36.9 C)   SpO2: 97% 97%    CONSTITUTIONAL: Well-appearing, NAD NEURO:  Alert and oriented x 3, no focal deficits EYES:  eyes equal and reactive ENT/NECK:  no LAD, no JVD CARDIO: Regular rate, well-perfused, normal S1 and S2 PULM:  CTAB no wheezing or rhonchi GI/GU:  normal bowel sounds, non-distended, non-tender MSK/SPINE:  No gross deformities, no edema; mild tenderness palpation to the lumbar back bilaterally SKIN:  no rash, atraumatic PSYCH:  Appropriate speech and behavior  *Additional and/or pertinent findings included in MDM below  Diagnostic and Interventional Summary    EKG Interpretation  Date/Time:    Ventricular Rate:    PR Interval:    QRS Duration:   QT Interval:    QTC Calculation:   R Axis:     Text Interpretation:        Labs Reviewed  CBC - Abnormal; Notable for the following components:      Result Value   WBC 10.7 (*)    All other components within normal limits  BASIC METABOLIC PANEL - Abnormal; Notable for the following components:   Glucose, Bld 103 (*)    All other components within normal limits    DG Lumbar Spine Complete  Final Result      Medications  acetaminophen (TYLENOL) tablet 1,000 mg (1,000 mg Oral Given 08/12/19 2010)  methocarbamol (ROBAXIN) tablet 1,000 mg (1,000 mg Oral Given 08/12/19 2010)  oxyCODONE (Oxy IR/ROXICODONE) immediate release tablet 5 mg (5 mg Oral Given 08/12/19 2011)  midazolam (VERSED) injection 2 mg (2 mg Intramuscular Given 08/12/19 2147)     Procedures  /  Critical Care Procedures  ED Course and Medical Decision Making  I have reviewed the triage vital signs, the nursing notes, and pertinent  available records from the EMR.  Listed above are laboratory and imaging tests that I personally ordered, reviewed, and interpreted and then considered in my medical decision making (see below for details).      Back pain seems musculoskeletal, no red flag symptoms to suggest myelopathy.  Patient does have a history of cancer and so x-ray was obtained to exclude obvious signs  of metastatic disease, x-ray without acute findings, there is loss of disc height at L5-S1.  Patient having trouble ambulating, will continue to try symptomatic management and discharge.  Patient did not respond much to Tylenol, Robaxin, oxycodone.  Next tried intramuscular dose of midazolam and patient was able to walk after that.  Home with short course of Valium, strict return precautions.  Barth Kirks. Sedonia Small, [place] [email or web address]  Final Clinical Impressions(s) / ED Diagnoses     ICD-10-CM   1. Acute midline low back pain without sciatica  M54.5     ED Discharge Orders         Ordered    lidocaine (LIDODERM) 5 %  Every 24 hours     08/12/19 2101    cyclobenzaprine (FLEXERIL) 10 MG tablet  2 times daily PRN     08/12/19 2101    diazepam (VALIUM) 5 MG tablet  2 times daily PRN     08/12/19 2245           Discharge Instructions Discussed with and Provided to Patient:     Discharge Instructions     You were evaluated in the Emergency Department and after careful evaluation, we did not find any emergent condition requiring admission or further testing in the hospital.  Your exam/testing today is overall reassuring.  Your symptoms seem to be due to muscle strain or spasm.  We are prescribing you with a strong muscle relaxer called Valium.  Please use caution when taking this medication as it can cause drowsiness.  Please avoid mixing it with alcohol or your home muscle relaxers or pain medications.  Please return to the Emergency Department if you  experience any worsening of your condition.  We encourage you to follow up with a primary care provider.  Thank you for allowing [place] to be a part of your care.       Maudie Flakes, MD 08/12/19 2248

## 2019-08-12 NOTE — Discharge Instructions (Addendum)
You were evaluated in the Emergency Department and after careful evaluation, we did not find any emergent condition requiring admission or further testing in the hospital.  Your exam/testing today is overall reassuring.  Your symptoms seem to be due to muscle strain or spasm.  We are prescribing you with a strong muscle relaxer called Valium.  Please use caution when taking this medication as it can cause drowsiness.  Please avoid mixing it with alcohol or your home muscle relaxers or pain medications.  Please return to the Emergency Department if you experience any worsening of your condition.  We encourage you to follow up with a primary care provider.  Thank you for allowing [place] to be a part of your care.

## 2019-08-12 NOTE — ED Triage Notes (Signed)
Patient brought in by [facility] from home. Patient has a hx of chronic back pain. Patient states it has gotten worse the last two days. Laying down is the only way it feels better.

## 2019-08-12 NOTE — ED Notes (Signed)
Pt able to ambulate without assistance.  

## 2019-09-02 DIAGNOSIS — I10 Essential (primary) hypertension: Secondary | ICD-10-CM | POA: Diagnosis not present

## 2019-09-02 DIAGNOSIS — E7800 Pure hypercholesterolemia, unspecified: Secondary | ICD-10-CM | POA: Diagnosis not present

## 2019-09-02 DIAGNOSIS — Z79899 Other long term (current) drug therapy: Secondary | ICD-10-CM | POA: Diagnosis not present

## 2019-09-02 DIAGNOSIS — R739 Hyperglycemia, unspecified: Secondary | ICD-10-CM | POA: Diagnosis not present

## 2019-09-02 DIAGNOSIS — E039 Hypothyroidism, unspecified: Secondary | ICD-10-CM | POA: Diagnosis not present

## 2019-09-09 DIAGNOSIS — E785 Hyperlipidemia, unspecified: Secondary | ICD-10-CM | POA: Diagnosis not present

## 2019-09-09 DIAGNOSIS — R739 Hyperglycemia, unspecified: Secondary | ICD-10-CM | POA: Diagnosis not present

## 2019-09-09 DIAGNOSIS — E039 Hypothyroidism, unspecified: Secondary | ICD-10-CM | POA: Diagnosis not present

## 2019-09-09 DIAGNOSIS — I10 Essential (primary) hypertension: Secondary | ICD-10-CM | POA: Diagnosis not present

## 2019-12-02 DIAGNOSIS — R739 Hyperglycemia, unspecified: Secondary | ICD-10-CM | POA: Diagnosis not present

## 2019-12-02 DIAGNOSIS — E785 Hyperlipidemia, unspecified: Secondary | ICD-10-CM | POA: Diagnosis not present

## 2019-12-02 DIAGNOSIS — E039 Hypothyroidism, unspecified: Secondary | ICD-10-CM | POA: Diagnosis not present

## 2019-12-09 DIAGNOSIS — E039 Hypothyroidism, unspecified: Secondary | ICD-10-CM | POA: Diagnosis not present

## 2019-12-09 DIAGNOSIS — R351 Nocturia: Secondary | ICD-10-CM | POA: Diagnosis not present

## 2019-12-09 DIAGNOSIS — E785 Hyperlipidemia, unspecified: Secondary | ICD-10-CM | POA: Diagnosis not present

## 2019-12-09 DIAGNOSIS — R739 Hyperglycemia, unspecified: Secondary | ICD-10-CM | POA: Diagnosis not present

## 2020-01-26 DIAGNOSIS — Z23 Encounter for immunization: Secondary | ICD-10-CM | POA: Diagnosis not present

## 2020-06-06 DIAGNOSIS — E039 Hypothyroidism, unspecified: Secondary | ICD-10-CM | POA: Diagnosis not present

## 2020-06-06 DIAGNOSIS — I10 Essential (primary) hypertension: Secondary | ICD-10-CM | POA: Diagnosis not present

## 2020-06-06 DIAGNOSIS — E785 Hyperlipidemia, unspecified: Secondary | ICD-10-CM | POA: Diagnosis not present

## 2020-06-06 DIAGNOSIS — R972 Elevated prostate specific antigen [PSA]: Secondary | ICD-10-CM | POA: Diagnosis not present

## 2020-06-06 DIAGNOSIS — R7309 Other abnormal glucose: Secondary | ICD-10-CM | POA: Diagnosis not present

## 2020-06-13 DIAGNOSIS — E785 Hyperlipidemia, unspecified: Secondary | ICD-10-CM | POA: Diagnosis not present

## 2020-06-13 DIAGNOSIS — R972 Elevated prostate specific antigen [PSA]: Secondary | ICD-10-CM | POA: Diagnosis not present

## 2020-06-13 DIAGNOSIS — Z0000 Encounter for general adult medical examination without abnormal findings: Secondary | ICD-10-CM | POA: Diagnosis not present

## 2020-06-13 DIAGNOSIS — E039 Hypothyroidism, unspecified: Secondary | ICD-10-CM | POA: Diagnosis not present

## 2020-09-20 DIAGNOSIS — H11002 Unspecified pterygium of left eye: Secondary | ICD-10-CM | POA: Diagnosis not present

## 2020-09-20 DIAGNOSIS — H353123 Nonexudative age-related macular degeneration, left eye, advanced atrophic without subfoveal involvement: Secondary | ICD-10-CM | POA: Diagnosis not present

## 2020-09-20 DIAGNOSIS — H2513 Age-related nuclear cataract, bilateral: Secondary | ICD-10-CM | POA: Diagnosis not present

## 2020-09-20 DIAGNOSIS — H524 Presbyopia: Secondary | ICD-10-CM | POA: Diagnosis not present

## 2020-10-04 DIAGNOSIS — Z0100 Encounter for examination of eyes and vision without abnormal findings: Secondary | ICD-10-CM | POA: Diagnosis not present

## 2020-12-05 DIAGNOSIS — I10 Essential (primary) hypertension: Secondary | ICD-10-CM | POA: Diagnosis not present

## 2020-12-05 DIAGNOSIS — E039 Hypothyroidism, unspecified: Secondary | ICD-10-CM | POA: Diagnosis not present

## 2020-12-05 DIAGNOSIS — R739 Hyperglycemia, unspecified: Secondary | ICD-10-CM | POA: Diagnosis not present

## 2020-12-05 DIAGNOSIS — E785 Hyperlipidemia, unspecified: Secondary | ICD-10-CM | POA: Diagnosis not present

## 2020-12-05 DIAGNOSIS — R7303 Prediabetes: Secondary | ICD-10-CM | POA: Diagnosis not present

## 2020-12-12 DIAGNOSIS — M542 Cervicalgia: Secondary | ICD-10-CM | POA: Diagnosis not present

## 2020-12-12 DIAGNOSIS — E785 Hyperlipidemia, unspecified: Secondary | ICD-10-CM | POA: Diagnosis not present

## 2020-12-12 DIAGNOSIS — E039 Hypothyroidism, unspecified: Secondary | ICD-10-CM | POA: Diagnosis not present

## 2020-12-12 DIAGNOSIS — Z85819 Personal history of malignant neoplasm of unspecified site of lip, oral cavity, and pharynx: Secondary | ICD-10-CM | POA: Diagnosis not present

## 2020-12-12 DIAGNOSIS — I10 Essential (primary) hypertension: Secondary | ICD-10-CM | POA: Diagnosis not present

## 2021-06-14 DIAGNOSIS — E785 Hyperlipidemia, unspecified: Secondary | ICD-10-CM | POA: Diagnosis not present

## 2021-06-14 DIAGNOSIS — R5383 Other fatigue: Secondary | ICD-10-CM | POA: Diagnosis not present

## 2021-06-14 DIAGNOSIS — R972 Elevated prostate specific antigen [PSA]: Secondary | ICD-10-CM | POA: Diagnosis not present

## 2021-06-14 DIAGNOSIS — E039 Hypothyroidism, unspecified: Secondary | ICD-10-CM | POA: Diagnosis not present

## 2021-06-14 DIAGNOSIS — R7303 Prediabetes: Secondary | ICD-10-CM | POA: Diagnosis not present

## 2021-06-14 DIAGNOSIS — Z125 Encounter for screening for malignant neoplasm of prostate: Secondary | ICD-10-CM | POA: Diagnosis not present

## 2021-06-14 DIAGNOSIS — I10 Essential (primary) hypertension: Secondary | ICD-10-CM | POA: Diagnosis not present

## 2021-06-21 DIAGNOSIS — Z8669 Personal history of other diseases of the nervous system and sense organs: Secondary | ICD-10-CM | POA: Diagnosis not present

## 2021-06-21 DIAGNOSIS — Z0000 Encounter for general adult medical examination without abnormal findings: Secondary | ICD-10-CM | POA: Diagnosis not present

## 2021-06-21 DIAGNOSIS — E785 Hyperlipidemia, unspecified: Secondary | ICD-10-CM | POA: Diagnosis not present

## 2021-06-21 DIAGNOSIS — E039 Hypothyroidism, unspecified: Secondary | ICD-10-CM | POA: Diagnosis not present

## 2021-06-21 DIAGNOSIS — I10 Essential (primary) hypertension: Secondary | ICD-10-CM | POA: Diagnosis not present

## 2021-09-11 DIAGNOSIS — E039 Hypothyroidism, unspecified: Secondary | ICD-10-CM | POA: Diagnosis not present

## 2021-09-11 DIAGNOSIS — E559 Vitamin D deficiency, unspecified: Secondary | ICD-10-CM | POA: Diagnosis not present

## 2021-09-11 DIAGNOSIS — E785 Hyperlipidemia, unspecified: Secondary | ICD-10-CM | POA: Diagnosis not present

## 2021-09-18 DIAGNOSIS — R21 Rash and other nonspecific skin eruption: Secondary | ICD-10-CM | POA: Diagnosis not present

## 2021-09-18 DIAGNOSIS — K4090 Unilateral inguinal hernia, without obstruction or gangrene, not specified as recurrent: Secondary | ICD-10-CM | POA: Diagnosis not present

## 2021-09-18 DIAGNOSIS — E039 Hypothyroidism, unspecified: Secondary | ICD-10-CM | POA: Diagnosis not present

## 2021-09-18 DIAGNOSIS — K429 Umbilical hernia without obstruction or gangrene: Secondary | ICD-10-CM | POA: Diagnosis not present

## 2021-10-01 DIAGNOSIS — F418 Other specified anxiety disorders: Secondary | ICD-10-CM | POA: Diagnosis not present

## 2021-10-01 DIAGNOSIS — E785 Hyperlipidemia, unspecified: Secondary | ICD-10-CM | POA: Diagnosis not present

## 2021-10-01 DIAGNOSIS — K4090 Unilateral inguinal hernia, without obstruction or gangrene, not specified as recurrent: Secondary | ICD-10-CM | POA: Diagnosis not present

## 2021-10-01 DIAGNOSIS — R21 Rash and other nonspecific skin eruption: Secondary | ICD-10-CM | POA: Diagnosis not present

## 2021-10-11 DIAGNOSIS — K4090 Unilateral inguinal hernia, without obstruction or gangrene, not specified as recurrent: Secondary | ICD-10-CM | POA: Diagnosis not present

## 2021-10-11 DIAGNOSIS — K429 Umbilical hernia without obstruction or gangrene: Secondary | ICD-10-CM | POA: Diagnosis not present

## 2021-10-24 DIAGNOSIS — L0100 Impetigo, unspecified: Secondary | ICD-10-CM | POA: Diagnosis not present

## 2021-10-24 DIAGNOSIS — A630 Anogenital (venereal) warts: Secondary | ICD-10-CM | POA: Diagnosis not present

## 2021-11-07 DIAGNOSIS — A630 Anogenital (venereal) warts: Secondary | ICD-10-CM | POA: Diagnosis not present

## 2021-11-13 NOTE — Progress Notes (Signed)
Sent message, via epic in basket, requesting order in epic from surgeon     11/13/21 0831  Preop Orders  Has preop orders? No  Name of staff/physician contacted for orders(Indicate phone or IB message) Stechschulte, Nickola Major, MD.

## 2021-11-14 ENCOUNTER — Ambulatory Visit: Payer: Self-pay | Admitting: Surgery

## 2021-11-19 NOTE — Patient Instructions (Signed)
DUE TO COVID-19 ONLY TWO VISITORS  (aged 69 and older)  ARE ALLOWED TO COME WITH YOU AND STAY IN THE WAITING ROOM ONLY DURING PRE OP AND PROCEDURE.   **NO VISITORS ARE ALLOWED IN THE SHORT STAY AREA OR RECOVERY ROOM!!**  IF YOU WILL BE ADMITTED INTO THE HOSPITAL YOU ARE ALLOWED ONLY FOUR SUPPORT PEOPLE DURING VISITATION HOURS ONLY (7 AM -8PM)   The support person(s) must pass our screening, gel in and out, and wear a mask at all times, including in the patient's room. Patients must also wear a mask when staff or their support person are in the room. Visitors GUEST BADGE MUST BE WORN VISIBLY  One adult visitor may remain with you overnight and MUST be in the room by 8 P.M.     Your procedure is scheduled on: 11/26/21   Report to [facility] Main Entrance    Report to admitting at : 6:45 AM   Call this number if you have problems the morning of surgery [phone]   Do not eat food :After Midnight.   After Midnight you may have the following liquids until : 6:00 AM DAY OF SURGERY  Water Black Coffee (sugar ok, NO MILK/CREAM OR CREAMERS)  Tea (sugar ok, NO MILK/CREAM OR CREAMERS) regular and decaf                             Plain Jell-O (NO RED)                                           Fruit ices (not with fruit pulp, NO RED)                                     Popsicles (NO RED)                                                                  Juice: apple, WHITE grape, WHITE cranberry Sports drinks like Gatorade (NO RED)    Oral Hygiene is also important to reduce your risk of infection.                                    Remember - BRUSH YOUR TEETH THE MORNING OF SURGERY WITH YOUR REGULAR TOOTHPASTE   Do NOT smoke after Midnight   Take these medicines the morning of surgery with A SIP OF WATER: escitalopram,doxycycline,euthyrox.Diazepam as needed.  DO NOT TAKE ANY ORAL DIABETIC MEDICATIONS DAY OF YOUR SURGERY  Bring CPAP mask and tubing day of surgery.                               You may not have any metal on your body including hair pins, jewelry, and body piercing             Do not wear lotions, powders, perfumes/cologne, or deodorant  Men may shave face and neck.   Do not bring valuables to the hospital. [place].   Contacts, dentures or bridgework may not be worn into surgery.   Bring small overnight bag day of surgery.   DO NOT [place]. PHARMACY WILL DISPENSE MEDICATIONS LISTED ON YOUR MEDICATION LIST TO YOU DURING YOUR ADMISSION [place]!    Patients discharged on the day of surgery will not be allowed to drive home.  Someone NEEDS to stay with you for the first 24 hours after anesthesia.   Special Instructions: Bring a copy of your healthcare power of attorney and living will documents         the day of surgery if you haven't scanned them before.              Please read over the following fact sheets you were given: IF YOU HAVE QUESTIONS ABOUT YOUR PRE-OP INSTRUCTIONS PLEASE CALL [phone]     [facility] - Preparing for Surgery Before surgery, you can play an important role.  Because skin is not sterile, your skin needs to be as free of germs as possible.  You can reduce the number of germs on your skin by washing with CHG (chlorahexidine gluconate) soap before surgery.  CHG is an antiseptic cleaner which kills germs and bonds with the skin to continue killing germs even after washing. Please DO NOT use if you have an allergy to CHG or antibacterial soaps.  If your skin becomes reddened/irritated stop using the CHG and inform your nurse when you arrive at Short Stay. Do not shave (including legs and underarms) for at least 48 hours prior to the first CHG shower.  You may shave your face/neck. Please follow these instructions carefully:  1.  Shower with CHG Soap the night before surgery and the  morning of Surgery.  2.  If you  choose to wash your hair, wash your hair first as usual with your  normal  shampoo.  3.  After you shampoo, rinse your hair and body thoroughly to remove the  shampoo.                           4.  Use CHG as you would any other liquid soap.  You can apply chg directly  to the skin and wash                       Gently with a scrungie or clean washcloth.  5.  Apply the CHG Soap to your body ONLY FROM THE NECK DOWN.   Do not use on face/ open                           Wound or open sores. Avoid contact with eyes, ears mouth and genitals (private parts).                       Wash face,  Genitals (private parts) with your normal soap.             6.  Wash thoroughly, paying special attention to the area where your surgery  will be performed.  7.  Thoroughly rinse your body with warm water from the neck down.  8.  DO NOT shower/wash with your normal soap after using and rinsing off  the CHG Soap.                9.  Pat yourself dry with a clean towel.            10.  Wear clean pajamas.            11.  Place clean sheets on your bed the night of your first shower and do not  sleep with pets. Day of Surgery : Do not apply any lotions/deodorants the morning of surgery.  Please wear clean clothes to the hospital/surgery center.  FAILURE TO FOLLOW THESE INSTRUCTIONS MAY RESULT IN THE CANCELLATION OF YOUR SURGERY PATIENT SIGNATURE_________________________________  NURSE SIGNATURE__________________________________  ________________________________________________________________________

## 2021-11-20 ENCOUNTER — Other Ambulatory Visit: Payer: Self-pay

## 2021-11-20 ENCOUNTER — Encounter (HOSPITAL_COMMUNITY)
Admission: RE | Admit: 2021-11-20 | Discharge: 2021-11-20 | Disposition: A | Discharge status: Home / Self Care | Payer: Medicare HMO | Source: Ambulatory Visit | Attending: Surgery | Admitting: Surgery

## 2021-11-20 ENCOUNTER — Encounter (HOSPITAL_COMMUNITY): Payer: Self-pay

## 2021-11-20 VITALS — BP 145/82 | HR 58 | Temp 97.9°F | Ht 65.5 in | Wt 117.0 lb

## 2021-11-20 DIAGNOSIS — Z01818 Encounter for other preprocedural examination: Secondary | ICD-10-CM | POA: Diagnosis not present

## 2021-11-20 DIAGNOSIS — I10 Essential (primary) hypertension: Secondary | ICD-10-CM | POA: Diagnosis not present

## 2021-11-20 LAB — CBC
HCT: 41.4 % (ref 39.0–52.0)
Hemoglobin: 13.8 g/dL (ref 13.0–17.0)
MCH: 33 pg (ref 26.0–34.0)
MCHC: 33.3 g/dL (ref 30.0–36.0)
MCV: 99 fL (ref 80.0–100.0)
Platelets: 322 10*3/uL (ref 150–400)
RBC: 4.18 MIL/uL — ABNORMAL LOW (ref 4.22–5.81)
RDW: 15.1 % (ref 11.5–15.5)
WBC: 6.3 10*3/uL (ref 4.0–10.5)
nRBC: 0 % (ref 0.0–0.2)

## 2021-11-20 NOTE — Progress Notes (Signed)
For Short Stay: [place] appointment date: Date of COVID positive in last 56 days:  Bowel Prep reminder:   For Anesthesia: PCP - Dr. Jani Gravel Cardiologist -   Chest x-ray -  EKG -  Stress Test -  ECHO -  Cardiac Cath -  Pacemaker/ICD device last checked: Pacemaker orders received: Device Rep notified:  Spinal Cord Stimulator:  Sleep Study -  CPAP -   Fasting Blood Sugar -  Checks Blood Sugar _____ times a day Date and result of last Hgb A1c-  Blood Thinner Instructions: Aspirin Instructions: Last Dose:  Activity level: Can go up a flight of stairs and activities of daily living without stopping and without chest pain and/or shortness of breath   Able to exercise without chest pain and/or shortness of breath   Unable to go up a flight of stairs without chest pain and/or shortness of breath     Anesthesia review: Hx: Smoker  Patient denies shortness of breath, fever, cough and chest pain at PAT appointment   Patient verbalized understanding of instructions that were given to them at the PAT appointment. Patient was also instructed that they will need to review over the PAT instructions again at home before surgery.

## 2021-11-26 ENCOUNTER — Other Ambulatory Visit: Payer: Self-pay

## 2021-11-26 ENCOUNTER — Ambulatory Visit (HOSPITAL_COMMUNITY): Payer: Medicare HMO | Admitting: Anesthesiology

## 2021-11-26 ENCOUNTER — Encounter (HOSPITAL_COMMUNITY)
Admission: RE | Disposition: A | Discharge status: Home / Self Care | Payer: Self-pay | Source: Home / Self Care | Attending: Surgery

## 2021-11-26 ENCOUNTER — Ambulatory Visit (HOSPITAL_BASED_OUTPATIENT_CLINIC_OR_DEPARTMENT_OTHER): Payer: Medicare HMO | Admitting: Anesthesiology

## 2021-11-26 ENCOUNTER — Ambulatory Visit (HOSPITAL_COMMUNITY)
Admission: RE | Admit: 2021-11-26 | Discharge: 2021-11-26 | Disposition: A | Discharge status: Home / Self Care | Payer: Medicare HMO | Attending: Surgery | Admitting: Surgery

## 2021-11-26 ENCOUNTER — Encounter (HOSPITAL_COMMUNITY): Payer: Self-pay | Admitting: Surgery

## 2021-11-26 DIAGNOSIS — K429 Umbilical hernia without obstruction or gangrene: Secondary | ICD-10-CM | POA: Diagnosis not present

## 2021-11-26 DIAGNOSIS — I10 Essential (primary) hypertension: Secondary | ICD-10-CM | POA: Insufficient documentation

## 2021-11-26 DIAGNOSIS — Z923 Personal history of irradiation: Secondary | ICD-10-CM | POA: Diagnosis not present

## 2021-11-26 DIAGNOSIS — Z87891 Personal history of nicotine dependence: Secondary | ICD-10-CM | POA: Diagnosis not present

## 2021-11-26 DIAGNOSIS — K4090 Unilateral inguinal hernia, without obstruction or gangrene, not specified as recurrent: Secondary | ICD-10-CM | POA: Insufficient documentation

## 2021-11-26 DIAGNOSIS — K4091 Unilateral inguinal hernia, without obstruction or gangrene, recurrent: Secondary | ICD-10-CM | POA: Diagnosis not present

## 2021-11-26 DIAGNOSIS — E039 Hypothyroidism, unspecified: Secondary | ICD-10-CM | POA: Diagnosis not present

## 2021-11-26 HISTORY — PX: UMBILICAL HERNIA REPAIR: SHX196

## 2021-11-26 HISTORY — PX: INGUINAL HERNIA REPAIR: SHX194

## 2021-11-26 LAB — BASIC METABOLIC PANEL
Anion gap: 7 (ref 5–15)
BUN: 6 mg/dL — ABNORMAL LOW (ref 8–23)
CO2: 27 mmol/L (ref 22–32)
Calcium: 9.6 mg/dL (ref 8.9–10.3)
Chloride: 108 mmol/L (ref 98–111)
Creatinine, Ser: 0.66 mg/dL (ref 0.61–1.24)
GFR, Estimated: >60 mL/min (ref >60)
Glucose, Bld: 102 mg/dL — ABNORMAL HIGH (ref 70–99)
Potassium: 3.6 mmol/L (ref 3.5–5.1)
Sodium: 142 mmol/L (ref 135–145)

## 2021-11-26 SURGERY — REPAIR, HERNIA, INGUINAL, LAPAROSCOPIC
Anesthesia: General

## 2021-11-26 MED ORDER — LIDOCAINE HCL (CARDIAC) PF 100 MG/5ML IV SOSY
PREFILLED_SYRINGE | INTRAVENOUS | Status: DC | PRN
  Administered 2021-11-26: 100 mg via INTRAVENOUS

## 2021-11-26 MED ORDER — CEFAZOLIN SODIUM-DEXTROSE 2-4 GM/100ML-% IV SOLN
INTRAVENOUS | Status: AC
  Filled 2021-11-26: qty 100

## 2021-11-26 MED ORDER — LACTATED RINGERS IV SOLN: INTRAVENOUS | Status: DC

## 2021-11-26 MED ORDER — HYDROMORPHONE HCL 1 MG/ML IJ SOLN: 0.2500 mg | INTRAMUSCULAR | Status: DC | PRN

## 2021-11-26 MED ORDER — CHLORHEXIDINE GLUCONATE CLOTH 2 % EX PADS: 6.0000 | MEDICATED_PAD | Freq: Once | CUTANEOUS | Status: DC

## 2021-11-26 MED ORDER — CHLORHEXIDINE GLUCONATE 0.12 % MT SOLN
15.0000 mL | Freq: Once | OROMUCOSAL | Status: AC
  Administered 2021-11-26: 15 mL via OROMUCOSAL

## 2021-11-26 MED ORDER — BUPIVACAINE LIPOSOME 1.3 % IJ SUSP
INTRAMUSCULAR | Status: AC
Start: 2021-11-26 — End: ?
  Filled 2021-11-26: qty 20

## 2021-11-26 MED ORDER — ACETAMINOPHEN 500 MG PO TABS: 1000.0000 mg | ORAL_TABLET | ORAL | Status: AC

## 2021-11-26 MED ORDER — ROCURONIUM BROMIDE 10 MG/ML (PF) SYRINGE
PREFILLED_SYRINGE | INTRAVENOUS | Status: AC
  Filled 2021-11-26: qty 10

## 2021-11-26 MED ORDER — ORAL CARE MOUTH RINSE: 15.0000 mL | Freq: Once | OROMUCOSAL | Status: AC

## 2021-11-26 MED ORDER — DEXAMETHASONE SODIUM PHOSPHATE 10 MG/ML IJ SOLN
INTRAMUSCULAR | Status: AC
Start: 2021-11-26 — End: ?
  Filled 2021-11-26: qty 1

## 2021-11-26 MED ORDER — ONDANSETRON HCL 4 MG/2ML IJ SOLN: 4.0000 mg | Freq: Once | INTRAMUSCULAR | Status: DC | PRN

## 2021-11-26 MED ORDER — PHENYLEPHRINE 80 MCG/ML (10ML) SYRINGE FOR IV PUSH (FOR BLOOD PRESSURE SUPPORT)
PREFILLED_SYRINGE | INTRAVENOUS | Status: AC
Start: 2021-11-26 — End: ?
  Filled 2021-11-26: qty 10

## 2021-11-26 MED ORDER — SUGAMMADEX SODIUM 500 MG/5ML IV SOLN
INTRAVENOUS | Status: DC | PRN
  Administered 2021-11-26: 106.2 mg via INTRAVENOUS

## 2021-11-26 MED ORDER — KETOROLAC TROMETHAMINE 15 MG/ML IJ SOLN
INTRAMUSCULAR | Status: AC
  Filled 2021-11-26: qty 1

## 2021-11-26 MED ORDER — EPHEDRINE 5 MG/ML INJ
INTRAVENOUS | Status: AC
  Filled 2021-11-26: qty 5

## 2021-11-26 MED ORDER — LIDOCAINE HCL (PF) 2 % IJ SOLN
INTRAMUSCULAR | Status: AC
  Filled 2021-11-26: qty 5

## 2021-11-26 MED ORDER — BUPIVACAINE LIPOSOME 1.3 % IJ SUSP
INTRAMUSCULAR | Status: DC | PRN
  Administered 2021-11-26: 20 mL

## 2021-11-26 MED ORDER — KETOROLAC TROMETHAMINE 15 MG/ML IJ SOLN
15.0000 mg | INTRAMUSCULAR | Status: AC
  Administered 2021-11-26: 15 mg via INTRAVENOUS

## 2021-11-26 MED ORDER — DEXAMETHASONE SODIUM PHOSPHATE 10 MG/ML IJ SOLN
INTRAMUSCULAR | Status: DC | PRN
  Administered 2021-11-26: 6 mg via INTRAVENOUS

## 2021-11-26 MED ORDER — MIDAZOLAM HCL 2 MG/2ML IJ SOLN
INTRAMUSCULAR | Status: AC
Start: 2021-11-26 — End: ?
  Filled 2021-11-26: qty 2

## 2021-11-26 MED ORDER — BUPIVACAINE-EPINEPHRINE (PF) 0.25% -1:200000 IJ SOLN
INTRAMUSCULAR | Status: AC
Start: 2021-11-26 — End: ?
  Filled 2021-11-26: qty 30

## 2021-11-26 MED ORDER — CEFAZOLIN SODIUM-DEXTROSE 2-4 GM/100ML-% IV SOLN
2.0000 g | INTRAVENOUS | Status: AC
  Administered 2021-11-26: 2 g via INTRAVENOUS

## 2021-11-26 MED ORDER — ACETAMINOPHEN 500 MG PO TABS
ORAL_TABLET | ORAL | Status: AC
  Administered 2021-11-26: 1000 mg via ORAL
  Filled 2021-11-26: qty 2

## 2021-11-26 MED ORDER — OXYCODONE HCL 5 MG PO TABS: 5.0000 mg | ORAL_TABLET | Freq: Once | ORAL | Status: DC | PRN

## 2021-11-26 MED ORDER — GABAPENTIN 300 MG PO CAPS
ORAL_CAPSULE | ORAL | Status: AC
  Administered 2021-11-26: 300 mg via ORAL
  Filled 2021-11-26: qty 1

## 2021-11-26 MED ORDER — BUPIVACAINE-EPINEPHRINE (PF) 0.25% -1:200000 IJ SOLN
INTRAMUSCULAR | Status: DC | PRN
  Administered 2021-11-26: 30 mL

## 2021-11-26 MED ORDER — OXYCODONE HCL 5 MG/5ML PO SOLN: 5.0000 mg | Freq: Once | ORAL | Status: DC | PRN

## 2021-11-26 MED ORDER — PROPOFOL 10 MG/ML IV BOLUS
INTRAVENOUS | Status: AC
  Filled 2021-11-26: qty 20

## 2021-11-26 MED ORDER — GABAPENTIN 300 MG PO CAPS: 300.0000 mg | ORAL_CAPSULE | ORAL | Status: AC

## 2021-11-26 MED ORDER — MIDAZOLAM HCL 5 MG/5ML IJ SOLN
INTRAMUSCULAR | Status: DC | PRN
  Administered 2021-11-26: 2 mg via INTRAVENOUS

## 2021-11-26 MED ORDER — ONDANSETRON HCL 4 MG/2ML IJ SOLN
INTRAMUSCULAR | Status: AC
  Filled 2021-11-26: qty 2

## 2021-11-26 MED ORDER — ROCURONIUM BROMIDE 100 MG/10ML IV SOLN
INTRAVENOUS | Status: DC | PRN
  Administered 2021-11-26: 40 mg via INTRAVENOUS

## 2021-11-26 MED ORDER — EPHEDRINE SULFATE (PRESSORS) 50 MG/ML IJ SOLN
INTRAMUSCULAR | Status: DC | PRN
  Administered 2021-11-26 (×2): 5 mg via INTRAVENOUS

## 2021-11-26 MED ORDER — SUCCINYLCHOLINE CHLORIDE 200 MG/10ML IV SOSY
PREFILLED_SYRINGE | INTRAVENOUS | Status: AC
  Filled 2021-11-26: qty 10

## 2021-11-26 MED ORDER — PHENYLEPHRINE HCL (PRESSORS) 10 MG/ML IV SOLN
INTRAVENOUS | Status: DC | PRN
  Administered 2021-11-26 (×2): 160 ug via INTRAVENOUS

## 2021-11-26 MED ORDER — PROPOFOL 10 MG/ML IV BOLUS
INTRAVENOUS | Status: DC | PRN
  Administered 2021-11-26: 120 mg via INTRAVENOUS

## 2021-11-26 MED ORDER — SUCCINYLCHOLINE CHLORIDE 200 MG/10ML IV SOSY
PREFILLED_SYRINGE | INTRAVENOUS | Status: DC | PRN
  Administered 2021-11-26: 100 mg via INTRAVENOUS

## 2021-11-26 MED ORDER — FENTANYL CITRATE (PF) 100 MCG/2ML IJ SOLN
INTRAMUSCULAR | Status: DC | PRN
  Administered 2021-11-26: 50 ug via INTRAVENOUS
  Administered 2021-11-26 (×2): 100 ug via INTRAVENOUS

## 2021-11-26 MED ORDER — OXYCODONE-ACETAMINOPHEN 5-325 MG PO TABS: 1.0000 | ORAL_TABLET | ORAL | 0 refills | Status: DC | PRN

## 2021-11-26 MED ORDER — ONDANSETRON HCL 4 MG/2ML IJ SOLN
INTRAMUSCULAR | Status: DC | PRN
  Administered 2021-11-26: 4 mg via INTRAVENOUS

## 2021-11-26 MED ORDER — BUPIVACAINE LIPOSOME 1.3 % IJ SUSP: 20.0000 mL | Freq: Once | INTRAMUSCULAR | Status: DC

## 2021-11-26 MED ORDER — FENTANYL CITRATE (PF) 250 MCG/5ML IJ SOLN
INTRAMUSCULAR | Status: AC
  Filled 2021-11-26: qty 5

## 2021-11-26 SURGICAL SUPPLY — 54 items
ADH SKN CLS APL DERMABOND .7 (GAUZE/BANDAGES/DRESSINGS) ×2
APL PRP STRL LF DISP 70% ISPRP (MISCELLANEOUS) ×2
BAG COUNTER SPONGE SURGICOUNT (BAG) ×3 IMPLANT
BAG SPNG CNTER NS LX DISP (BAG) ×2
BALL CTTN LRG ABS STRL LF (GAUZE/BANDAGES/DRESSINGS) ×2
CABLE HIGH FREQUENCY MONO STRZ (ELECTRODE) ×3 IMPLANT
CHLORAPREP W/TINT 26 (MISCELLANEOUS) ×3 IMPLANT
COTTONBALL LRG STERILE PKG (GAUZE/BANDAGES/DRESSINGS) ×3 IMPLANT
COVER SURGICAL LIGHT HANDLE (MISCELLANEOUS) ×3 IMPLANT
DERMABOND ADVANCED (GAUZE/BANDAGES/DRESSINGS) ×1
DERMABOND ADVANCED .7 DNX12 (GAUZE/BANDAGES/DRESSINGS) ×2 IMPLANT
DRAPE LAPAROTOMY T 98X78 PEDS (DRAPES) ×3 IMPLANT
DRSG TEGADERM 4X4.75 (GAUZE/BANDAGES/DRESSINGS) ×3 IMPLANT
ELECT REM PT RETURN 15FT ADLT (MISCELLANEOUS) ×3 IMPLANT
GLOVE BIOGEL PI IND STRL 8 (GLOVE) ×2 IMPLANT
GLOVE BIOGEL PI INDICATOR 8 (GLOVE) ×1
GLOVE INDICATOR 8.0 STRL GRN (GLOVE) ×3 IMPLANT
GLOVE SURG LX 7.5 STRW (GLOVE) ×1
GLOVE SURG LX STRL 7.5 STRW (GLOVE) ×2 IMPLANT
GOWN STRL REUS W/ TWL XL LVL3 (GOWN DISPOSABLE) ×4 IMPLANT
GOWN STRL REUS W/TWL XL LVL3 (GOWN DISPOSABLE) ×6
GRASPER SUT TROCAR 14GX15 (MISCELLANEOUS) ×3 IMPLANT
IRRIG SUCT STRYKERFLOW 2 WTIP (MISCELLANEOUS)
IRRIGATION SUCT STRKRFLW 2 WTP (MISCELLANEOUS) IMPLANT
KIT BASIN OR (CUSTOM PROCEDURE TRAY) ×3 IMPLANT
KIT TURNOVER KIT A (KITS) ×1 IMPLANT
MARKER SKIN DUAL TIP RULER LAB (MISCELLANEOUS) ×3 IMPLANT
MESH 3DMAX 5X7 LT XLRG (Mesh General) ×1 IMPLANT
NDL INSUFFLATION 14GA 120MM (NEEDLE) ×4 IMPLANT
NEEDLE HYPO 22GX1.5 SAFETY (NEEDLE) ×3 IMPLANT
NEEDLE INSUFFLATION 14GA 120MM (NEEDLE) ×6 IMPLANT
NS IRRIG 1000ML POUR BTL (IV SOLUTION) ×3 IMPLANT
PACK GENERAL/GYN (CUSTOM PROCEDURE TRAY) ×3 IMPLANT
RELOAD STAPLE 4.0 BLU F/HERNIA (INSTRUMENTS) IMPLANT
RELOAD STAPLE 4.8 BLK F/HERNIA (STAPLE) ×2 IMPLANT
RELOAD STAPLE HERNIA 4.0 BLUE (INSTRUMENTS) ×3 IMPLANT
RELOAD STAPLE HERNIA 4.8 BLK (STAPLE) ×3 IMPLANT
SCISSORS LAP 5X35 DISP (ENDOMECHANICALS) ×3 IMPLANT
SET TUBE SMOKE EVAC HIGH FLOW (TUBING) ×3 IMPLANT
SPIKE FLUID TRANSFER (MISCELLANEOUS) ×3 IMPLANT
STAPLER HERNIA 12 8.5 360D (INSTRUMENTS) ×3 IMPLANT
SUT MNCRL AB 4-0 PS2 18 (SUTURE) ×3 IMPLANT
SUT NOVA NAB GS-21 0 18 T12 DT (SUTURE) ×1 IMPLANT
SUT PDS AB 2-0 CT2 27 (SUTURE) IMPLANT
SUT VIC AB 3-0 SH 8-18 (SUTURE) ×3 IMPLANT
SUT VICRYL 0 UR6 27IN ABS (SUTURE) IMPLANT
SYR CONTROL 10ML LL (SYRINGE) ×3 IMPLANT
TOWEL OR 17X26 10 PK STRL BLUE (TOWEL DISPOSABLE) ×3 IMPLANT
TRAY FOL W/BAG SLVR 16FR STRL (SET/KITS/TRAYS/PACK) ×2 IMPLANT
TRAY FOLEY MTR SLVR 16FR STAT (SET/KITS/TRAYS/PACK) IMPLANT
TRAY FOLEY W/BAG SLVR 16FR LF (SET/KITS/TRAYS/PACK) ×3
TRAY LAPAROSCOPIC (CUSTOM PROCEDURE TRAY) ×3 IMPLANT
TROCAR ADV FIXATION 12X100MM (TROCAR) ×3 IMPLANT
TROCAR Z-THREAD OPTICAL 5X100M (TROCAR) ×6 IMPLANT

## 2021-11-26 NOTE — Discharge Instructions (Signed)
 GROIN HERNIA REPAIR POST OPERATIVE INSTRUCTIONS  Thinking Clearly  The anesthesia may cause you to feel different for 1 or 2 days. Do not drive, drink alcohol, or make any big decisions for at least 2 days.  Nutrition When you wake up, you will be able to drink small amounts of liquid. If you do not feel sick, you can slowly advance your diet to regular foods. Continue to drink lots of fluids, usually about 8 to 10 glasses per day. Eat a high-fiber diet so you don't strain during bowel movements. High-Fiber Foods Foods high in fiber include beans, bran cereals and whole-grain breads, peas, dried fruit (figs, apricots, and dates), raspberries, blackberries, strawberries, sweet corn, broccoli, baked potatoes with skin, plums, pears, apples, greens, and nuts. Activity Slowly increase your activity. Be sure to get up and walk every hour or so to prevent blood clots. No heavy lifting or strenuous activity for 4 weeks following surgery to prevent hernias at your incision sites or recurrence of your hernia. It is normal to feel tired. You may need more sleep than usual.  Get your rest but make sure to get up and move around frequently to prevent blood clots and pneumonia.  Work and Return to School You can go back to work when you feel well enough. Discuss the timing with your surgeon. You can usually go back to school or work 1 week or less after an laparoscopic or an open repair. If your work requires heavy lifting or strenuous activity you need to be placed on light duty for 4 weeks following surgery. You can return to gym class, sports or other physical activities 4 weeks after surgery.  Wound Care You may experience significant bruising in the groin including into the scrotum in males.  Rest, elevating the groin and scrotum above the level of the heart, ice and compression with tight fitting underwear can help.  Always wash your hands before and after touching near your incision site. Do  not soak in a bathtub until cleared at your follow up appointment. You may take a shower 24 hours after surgery. A small amount of drainage from the incision is normal. If the drainage is thick and yellow or the site is red, you may have an infection, so call your surgeon. If you have a drain in one of your incisions, it will be taken out in office when the drainage stops. Steri-Strips will fall off in 7 to 10 days or they will be removed during your first office visit. If you have dermabond glue covering over the incision, allow the glue to flake off on its own. Protect the new skin, especially from the sun. The sun can burn and cause darker scarring. Your scar will heal in about 4 to 6 weeks and will become softer and continue to fade over the next year.  The cosmetic appearance of the incisions will improve over the course of the first year after surgery. Sensation around your incision will return in a few weeks or months.  Bowel Movements After intestinal surgery, you may have loose watery stools for several days. If watery diarrhea lasts longer than 3 days, contact your surgeon. Pain medication (narcotics) can cause constipation. Increase the fiber in your diet with high-fiber foods if you are constipated. You can take an over the counter stool softener like Colace to avoid constipation.  Additional over the counter medications can also be used if Colace isn't sufficient (for example, Milk of Magnesia or Miralax).    Pain The amount of pain is different for each person. Some people need only 1 to 3 doses of pain control medication, while others need more. Take alternating doses of tylenol and ibuprofen around the clock for the first five days following surgery.  This will provide a baseline of pain control and help with inflammation.  Take the narcotic pain medication in addition if needed for severe pain.  Contact Your Surgeon at [phone], if you have: Pain that will not go away Pain that  gets worse A fever of more than 101F (38.3C) Repeated vomiting Swelling, redness, bleeding, or bad-smelling drainage from your wound site Strong abdominal pain No bowel movement or unable to pass gas for 3 days Watery diarrhea lasting longer than 3 days  Pain Control The goal of pain control is to minimize pain, keep you moving and help you heal. Your surgical team will work with you on your pain plan. Most often a combination of therapies and medications are used to control your pain. You may also be given medication (local anesthetic) at the surgical site. This may help control your pain for several days. Extreme pain puts extra stress on your body at a time when your body needs to focus on healing. Do not wait until your pain has reached a level "10" or is unbearable before telling your doctor or nurse. It is much easier to control pain before it becomes severe. Following a laparoscopic procedure, pain is sometimes felt in the shoulder. This is due to the gas inserted into your abdomen during the procedure. Moving and walking helps to decrease the gas and the right shoulder pain.  Use the guide below for ways to manage your post-operative pain. Learn more by going to facs.org/safepaincontrol.  How Intense Is My Pain Common Therapies to Feel Better       I hardly notice my pain, and it does not interfere with my activities.  I notice my pain and it distracts me, but I can still do activities (sitting up, walking, standing).  Non-Medication Therapies  Ice (in a bag, applied over clothing at the surgical site), elevation, rest, meditation, massage, distraction (music, TV, play) walking and mild exercise Splinting the abdomen with pillows +  Non-Opioid Medications Acetaminophen (Tylenol) Non-steroidal anti-inflammatory drugs (NSAIDS) Aspirin, Ibuprofen (Motrin, Advil) Naproxen (Aleve) Take these as needed, when you feel pain. Both acetaminophen and NSAIDs help to decrease pain  and swelling (inflammation).      My pain is hard to ignore and is more noticeable even when I rest.  My pain interferes with my usual activities.  Non-Medication Therapies  +  Non-Opioid medications  Take on a regular schedule (around-the-clock) instead of as needed. (For example, Tylenol every 6 hours at 9:00 am, 3:00 pm, 9:00 pm, 3:00 am and Motrin every 6 hours at 12:00 am, 6:00 am, 12:00 pm, 6:00 pm)         I am focused on my pain, and I am not doing my daily activities.  I am groaning in pain, and I cannot sleep. I am unable to do anything.  My pain is as bad as it could be, and nothing else matters.  Non-Medication Therapies  +  Around-the-Clock Non-Opioid Medications  +  Short-acting opioids  Opioids should be used with other medications to manage severe pain. Opioids block pain and give a feeling of euphoria (feel high). Addiction, a serious side effect of opioids, is rare with short-term (a few days) use.  Examples of short-acting opioids   include: Tramadol (Ultram), Hydrocodone (Norco, Vicodin), Hydromorphone (Dilaudid), Oxycodone (Oxycontin)     The above directions have been adapted from the American College of [employer]geons Surgical Patient Education Program.  Please refer to the ACS website if needed: [email or web address]://[email or web address]/-/media/[email or web address]   Triniti Gruetzmacher, MD [facility] [address], [address], [place], [place]  [address] ?  [address] [address], [place], [place]   [address] [phone] ? [phone] ? FAX [phone] Web site: [email or web address]  

## 2021-11-26 NOTE — Progress Notes (Signed)
Pt and pts wife [name] aware of surgical delay. Wife at bedside.

## 2021-11-26 NOTE — Op Note (Addendum)
Patient: Angel [name] (February 08, 1951, [id number])  Date of Surgery: 11/26/2021   Preoperative Diagnosis: LEFT INGUINAL AND UMBILICAL HERNIA   Postoperative Diagnosis: LEFT INGUINAL AND UMBILICAL HERNIA   Surgical Procedure: LAPAROSCOPIC LEFT INGUINAL HERNIA REPAIR WITH MESH: [id number] UMBILICAL HERNIA REPAIR:    Operative Team Members:  Surgeon(s) and Role:    * Kariel Skillman, Nickola Major, MD - Primary   Anesthesiologist: Myrtie Soman, MD CRNA: Jonna Munro, CRNA; Montel Clock, CRNA   Anesthesia: General   Fluids:  Total I/O In: 1000 [I.V.:900; IV Piggyback:100] Out: 20 [MLYYT:03]  Complications: None  Drains:  None  Specimen: None  Disposition:  PACU - hemodynamically stable.  Plan of Care: Discharge to home after PACU  Indications for Procedure: SNYDER COLAVITO is a 71 y.o. male who presented with an umbilical hernia and a left inguinal hernia.  I recommended a laparoscopic left inguinal hernia repair with mesh and umbilical hernia repair.  The procedure itself as well as its risk, benefits, and alternatives were discussed with the patient in full who granted consent to proceed.  Findings:  Left inguinal hernia Technique: Transabdominal preperitoneal (TAPP) Hernia Location: Direct left inguinal hernia Mesh Size &Type: Bard 3D max extra-large left-sided mesh Mesh Fixation: Endo-Universal hernia stapler  Umbilical hernia Sutured repair with no mesh Four figure of eight 0 Novafil sutures 2cm wide by 1 cm tall defect  Infection status: Patient: Private Patient Elective Case Case: Elective Infection Present At Time Of Surgery (PATOS): None   Description of Procedure:  The patient was positioned supine, padded and secured to the bed, with both arms tucked.  The abdomen was widely prepped and draped.  A time out procedure was performed.  A 1 cm infraumbilical incision was made.  The abdomen was entered without trauma to the underlying viscera.  The abdomen was  insufflated to 15 mm of Hg.  A 12 mm trocar was inserted at the periumbilical incision.  Additional 5 mm trocars were placed in the left and right abdomen.  There was no trauma to the underlying viscera.  There was an indirect hernia on the LEFT.  Utilizing a transabdominal pre peritoneal technique (TAPP), a horizontal incision was made in the peritoneum, immediately below the umbilicus.  Dissection was carried out in the pre peritoneal space down to the level of the hernia sac which was reduced into the peritoneal cavity completely.  The cord contents were parietalized and preserved.  A large pre peritoneal dissection was performed to uncover the direct, indirect, femoral and obturator spaces.  Cooper's ligament was uncovered medially and the psoas muscle uncovered laterally.  The mesh, as documented above, was opened and advanced into the pre peritoneal position so that it more than adequately covered the indirect, direct, femoral and obturator spaces.  The mesh laid flat, with no inferior folds and covered the entire myopectineal orifice.  The mesh was fixated with the endo-universal hernia stapler to Cooper's ligament and the posterior aspect of the rectus muscle.  The peritoneal flap was closed with the same device.  There were no peritoneal defects or exposed mesh at the conclusion.  The umbilical trocar was removed. The hernia defect at the umbilicus was defined at the fascial edges using electrocautery.  The hernia measured 2 cm wide by 1 cm tall.  The umbilical stalk was detached from the fascia.  A sutured umbilical hernia repair was performed utilizing figure of eight 0-Novafil suture. The defect was closed horizontally under no tension.  The wound was  irrigated with saline.  The umbilical skin was tacked down to the fascial repair with 4-0 Monocryl suture.  The skin was closed with 4-0 Monocryl subcuticular suture and skin glue.  A small gauze pressure dressing was placed into the umbilicus and  covered with a Tegaderm dressing.   and the fascial defect was closed with a 0 Vicryl suture.  The peritoneal cavity was completely desufflated, the trocars removed and the skin closed with 4-0 Monocryl subcuticular suture and skin glue.  All sponge and needle counts were correct at the end of the case.  Louanna Raw, MD General, Bariatric, & Minimally Invasive Surgery [facility], [place]

## 2021-11-26 NOTE — Anesthesia Procedure Notes (Signed)
Procedure Name: Intubation Date/Time: 11/26/2021 9:48 AM  Performed by: Jonna Munro, CRNAPre-anesthesia Checklist: Patient identified, Emergency Drugs available, Suction available, Patient being monitored and Timeout performed Patient Re-evaluated:Patient Re-evaluated prior to induction Oxygen Delivery Method: Circle system utilized Preoxygenation: Pre-oxygenation with 100% oxygen Induction Type: IV induction and Rapid sequence Laryngoscope Size: Mac, 3 and Glidescope Grade View: Grade I Tube type: Oral Tube size: 7.5 mm Number of attempts: 1 Airway Equipment and Method: Video-laryngoscopy and Rigid stylet Placement Confirmation: ETT inserted through vocal cords under direct vision, positive ETCO2, CO2 detector and breath sounds checked- equal and bilateral Secured at: 23 cm Tube secured with: Tape Dental Injury: Teeth and Oropharynx as per pre-operative assessment

## 2021-11-26 NOTE — Anesthesia Postprocedure Evaluation (Signed)
Anesthesia Post Note  Patient: Angel [name]  Procedure(s) Performed: LAPAROSCOPIC LEFT INGUINAL HERNIA REPAIR WITH MESH (Left) UMBILICAL HERNIA REPAIR     Patient location during evaluation: PACU Anesthesia Type: General Level of consciousness: awake and alert Pain management: pain level controlled Vital Signs Assessment: post-procedure vital signs reviewed and stable Respiratory status: spontaneous breathing, nonlabored ventilation, respiratory function stable and patient connected to nasal cannula oxygen Cardiovascular status: blood pressure returned to baseline and stable Postop Assessment: no apparent nausea or vomiting Anesthetic complications: no   No notable events documented.  Last Vitals:  Vitals:   11/26/21 1145 11/26/21 1200  BP: (!) 178/90 (!) 172/83  Pulse: 63 (!) 54  Resp: 19 17  Temp:    SpO2: 93% 91%    Last Pain:  Vitals:   11/26/21 1145  TempSrc:   PainSc: 0-No pain                 [name] S

## 2021-11-26 NOTE — H&P (Signed)
Admitting Physician: Nickola Major Marvel Sapp  Service: General surgery  CC: Hernias  Subjective   HPI: HASTEN SWEITZER is an 71 y.o. 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   Left Base of Tongue and Vallecula   S/P radiation therapy 01/26/2014-03/18/2014   base of tongue, bilateral neck/ 70 Gy/35 fx,    Thrush of mouth and esophagus ([place]) 02/21/2014    Past Surgical History:  Procedure Laterality Date   APPENDECTOMY     BACK SURGERY     Lumbar   COLONOSCOPY WITH PROPOFOL N/A 10/16/2018   Procedure: COLONOSCOPY WITH PROPOFOL;  Surgeon: Carol Ada, MD;  Location[facility];  Service: Endoscopy;  Laterality: N/A;   ESOPHAGOSCOPY  10/04/2013   Procedure: ESOPHAGOSCOPY;  Surgeon: Melida Quitter, MD;  Location: [place];  Service: ENT;;   MANDIBLE SURGERY     from [place] N/A 10/08/2013   Procedure: extraction of tooth #'s 2,3,4,5,6,11,12,13,14,15,18,19,20,21,22,23,24,25,26, 27,28, 29, 31 with alveoloplasty ;  Surgeon: Lenn Cal, DDS;  Location: [place];  Service: Oral Surgery;  Laterality: N/A;   PANENDOSCOPY N/A 10/04/2013   Procedure: Direct Laryngoscopy  WITH BIOPSY;  Surgeon: Melida Quitter, MD;  [facility];  Service: ENT;  Laterality: N/A;   TESTICLE SURGERY     as a infant   TONSILLECTOMY      Family History  Problem Relation Age of Onset   Cancer Mother        Lung   Cancer Father        ? Lung    Social:  reports that he quit smoking about 4 years ago. His smoking use included cigarettes. He has a 11.25 pack-year smoking history. He has never used smokeless tobacco. He reports that he does not  currently use alcohol. He reports that he does not currently use drugs after having used the following drugs: Marijuana.  Allergies: No Known Allergies  Medications: Current Outpatient Medications  Medication Instructions   cholecalciferol (VITAMIN D3) 1,000 Units, Oral, Daily   diazepam (VALIUM) 5 MG tablet 1 tablet, Oral, Daily PRN   doxycycline (VIBRA-TABS) 100 mg, Oral, 2 times daily   escitalopram (LEXAPRO) 10 mg, Oral, Daily   Euthyrox 75 mcg, Oral, Daily   methocarbamol (ROBAXIN) 500 mg, Oral, Every 4 hours PRN   Multiple Vitamin (MULTIVITAMIN WITH MINERALS) TABS tablet 1 tablet, Oral, Daily, Centrum 50+   mupirocin cream (BACTROBAN) 2 % 1 Application, Topical, 3 times daily   Red Yeast Rice 600 mg, Oral, Daily   traMADol (ULTRAM) 50 mg, Oral, Every 4 hours PRN    ROS - all of the below systems have been reviewed with the patient and positives are indicated with bold text General: chills, fever or night sweats Eyes: blurry vision or double vision ENT: epistaxis or sore throat Allergy/Immunology: itchy/watery eyes or nasal congestion Hematologic/Lymphatic: bleeding problems, blood clots or swollen lymph nodes Endocrine: temperature intolerance or unexpected weight changes Breast: new or changing breast lumps or nipple discharge Resp: cough, shortness of breath, or wheezing CV: chest pain or dyspnea on exertion GI: as per HPI GU:  dysuria, trouble voiding, or hematuria MSK: joint pain or joint stiffness Neuro: TIA or stroke symptoms Derm: pruritus and skin lesion changes Psych: anxiety and depression  Objective   PE Blood pressure (!) 145/83, pulse 69, temperature 98.8 F (37.1 C), temperature source Oral, resp. rate 18, SpO2 98 %. Constitutional: NAD; conversant; no deformities Eyes: Moist conjunctiva; no lid lag; anicteric; PERRL Neck: Trachea midline; no thyromegaly Lungs: Normal respiratory effort; no tactile fremitus CV: RRR; no palpable thrills; no pitting  edema GI: Abd umbilical hernia, left inguinal hernia; no palpable hepatosplenomegaly MSK: Normal range of motion of extremities; no clubbing/cyanosis Psychiatric: Appropriate affect; alert and oriented x3 Lymphatic: No palpable cervical or axillary lymphadenopathy  No results found for this or any previous visit (from the past 24 hour(s)).  Imaging Orders  No imaging studies ordered today     Assessment and Plan   Mr. Plascencia is a 71 year old male who presented with an umbilical hernia without obstruction and without gangrene and a left inguinal hernia  I recommended laparoscopic left inguinal hernia repair and umbilical hernia repair. The procedure itself as well as its risk, benefits, and alternatives were discussed the patient. After full discussion all questions answered the patient granted consent to proceed.      Felicie Morn, MD  [facility] Use [email or web address] to contact on call provider

## 2021-11-26 NOTE — Transfer of Care (Signed)
Immediate Anesthesia Transfer of Care Note  Patient: Angel [name]  Procedure(s) Performed: LAPAROSCOPIC LEFT INGUINAL HERNIA REPAIR WITH MESH (Left) UMBILICAL HERNIA REPAIR  Patient Location: PACU  Anesthesia Type:General  Level of Consciousness: drowsy and patient cooperative  Airway & Oxygen Therapy: Patient Spontanous Breathing and Patient connected to face mask oxygen  Post-op Assessment: Report given to RN and Post -op Vital signs reviewed and stable  Post vital signs: Reviewed and stable  Last Vitals:  Vitals Value Taken Time  BP 179/82 11/26/21 1115  Temp 36.6 C 11/26/21 1115  Pulse 69 11/26/21 1117  Resp 21 11/26/21 1117  SpO2 100 % 11/26/21 1117  Vitals shown include unvalidated device data.  Last Pain:  Vitals:   11/26/21 1115  TempSrc:   PainSc: 0-No pain         Complications: No notable events documented.

## 2021-11-26 NOTE — Anesthesia Preprocedure Evaluation (Signed)
Anesthesia Evaluation  Patient identified by MRN, date of birth, ID band Patient awake    Reviewed: Allergy & Precautions, NPO status , Patient's Chart, lab work & pertinent test results  Airway Mallampati: III  TM Distance: <3 FB Neck ROM: Limited   Comment:  S/P radiation therapy 01/26/2014-03/18/2014 base of tongue, bilateral neck/ 70 Gy/35 fx,   Dental no notable dental hx.    Pulmonary neg pulmonary ROS, former smoker,    Pulmonary exam normal breath sounds clear to auscultation       Cardiovascular hypertension, Normal cardiovascular exam Rhythm:Regular Rate:Normal     Neuro/Psych negative neurological ROS  negative psych ROS   GI/Hepatic negative GI ROS, Neg liver ROS,   Endo/Other  Hypothyroidism   Renal/GU negative Renal ROS  negative genitourinary   Musculoskeletal negative musculoskeletal ROS (+)   Abdominal   Peds negative pediatric ROS (+)  Hematology negative hematology ROS (+)   Anesthesia Other Findings   Reproductive/Obstetrics negative OB ROS                             Anesthesia Physical Anesthesia Plan  ASA: 3  Anesthesia Plan: General   Post-op Pain Management: Minimal or no pain anticipated   Induction: Intravenous  PONV Risk Score and Plan: 2 and Ondansetron, Dexamethasone and Treatment may vary due to age or medical condition  Airway Management Planned: Oral ETT and Video Laryngoscope Planned  Additional Equipment:   Intra-op Plan:   Post-operative Plan: Extubation in OR  Informed Consent: I have reviewed the patients History and Physical, chart, labs and discussed the procedure including the risks, benefits and alternatives for the proposed anesthesia with the patient or authorized representative who has indicated his/her understanding and acceptance.     Dental advisory given  Plan Discussed with: CRNA and Surgeon  Anesthesia Plan Comments:          Anesthesia Quick Evaluation

## 2021-11-27 ENCOUNTER — Encounter (HOSPITAL_COMMUNITY): Payer: Self-pay | Admitting: Surgery

## 2021-12-18 DIAGNOSIS — A630 Anogenital (venereal) warts: Secondary | ICD-10-CM | POA: Diagnosis not present

## 2022-01-01 DIAGNOSIS — E039 Hypothyroidism, unspecified: Secondary | ICD-10-CM | POA: Diagnosis not present

## 2022-01-01 DIAGNOSIS — R739 Hyperglycemia, unspecified: Secondary | ICD-10-CM | POA: Diagnosis not present

## 2022-01-01 DIAGNOSIS — E785 Hyperlipidemia, unspecified: Secondary | ICD-10-CM | POA: Diagnosis not present

## 2022-01-07 DIAGNOSIS — R21 Rash and other nonspecific skin eruption: Secondary | ICD-10-CM | POA: Diagnosis not present

## 2022-01-07 DIAGNOSIS — K4090 Unilateral inguinal hernia, without obstruction or gangrene, not specified as recurrent: Secondary | ICD-10-CM | POA: Diagnosis not present

## 2022-01-07 DIAGNOSIS — E785 Hyperlipidemia, unspecified: Secondary | ICD-10-CM | POA: Diagnosis not present

## 2022-01-07 DIAGNOSIS — F418 Other specified anxiety disorders: Secondary | ICD-10-CM | POA: Diagnosis not present

## 2022-01-08 DIAGNOSIS — A630 Anogenital (venereal) warts: Secondary | ICD-10-CM | POA: Diagnosis not present

## 2022-01-15 ENCOUNTER — Other Ambulatory Visit (HOSPITAL_BASED_OUTPATIENT_CLINIC_OR_DEPARTMENT_OTHER): Payer: Self-pay

## 2022-01-15 ENCOUNTER — Other Ambulatory Visit: Payer: Self-pay

## 2022-01-15 DIAGNOSIS — E785 Hyperlipidemia, unspecified: Secondary | ICD-10-CM

## 2022-03-24 ENCOUNTER — Encounter (HOSPITAL_COMMUNITY): Payer: Self-pay | Admitting: Emergency Medicine

## 2022-03-24 ENCOUNTER — Other Ambulatory Visit: Payer: Self-pay

## 2022-03-24 ENCOUNTER — Emergency Department (HOSPITAL_COMMUNITY): Payer: Medicare HMO

## 2022-03-24 ENCOUNTER — Emergency Department (HOSPITAL_COMMUNITY)
Admission: EM | Admit: 2022-03-24 | Discharge: 2022-03-24 | Disposition: A | Discharge status: Home / Self Care | Payer: Medicare HMO | Attending: Student | Admitting: Student

## 2022-03-24 DIAGNOSIS — I10 Essential (primary) hypertension: Secondary | ICD-10-CM | POA: Insufficient documentation

## 2022-03-24 DIAGNOSIS — R55 Syncope and collapse: Secondary | ICD-10-CM | POA: Diagnosis not present

## 2022-03-24 DIAGNOSIS — E039 Hypothyroidism, unspecified: Secondary | ICD-10-CM | POA: Insufficient documentation

## 2022-03-24 DIAGNOSIS — Z87891 Personal history of nicotine dependence: Secondary | ICD-10-CM | POA: Diagnosis not present

## 2022-03-24 DIAGNOSIS — S0990XA Unspecified injury of head, initial encounter: Secondary | ICD-10-CM | POA: Diagnosis not present

## 2022-03-24 DIAGNOSIS — Z85841 Personal history of malignant neoplasm of brain: Secondary | ICD-10-CM | POA: Diagnosis not present

## 2022-03-24 DIAGNOSIS — M542 Cervicalgia: Secondary | ICD-10-CM | POA: Diagnosis not present

## 2022-03-24 DIAGNOSIS — R519 Headache, unspecified: Secondary | ICD-10-CM | POA: Diagnosis not present

## 2022-03-24 DIAGNOSIS — W19XXXA Unspecified fall, initial encounter: Secondary | ICD-10-CM | POA: Diagnosis not present

## 2022-03-24 DIAGNOSIS — R58 Hemorrhage, not elsewhere classified: Secondary | ICD-10-CM | POA: Diagnosis not present

## 2022-03-24 DIAGNOSIS — Z8589 Personal history of malignant neoplasm of other organs and systems: Secondary | ICD-10-CM | POA: Diagnosis not present

## 2022-03-24 LAB — CBC WITH DIFFERENTIAL/PLATELET
Abs Immature Granulocytes: 0.08 10*3/uL — ABNORMAL HIGH (ref 0.00–0.07)
Basophils Absolute: 0.1 10*3/uL (ref 0.0–0.1)
Basophils Relative: 1 %
Eosinophils Absolute: 0.3 10*3/uL (ref 0.0–0.5)
Eosinophils Relative: 3 %
HCT: 40.1 % (ref 39.0–52.0)
Hemoglobin: 13.2 g/dL (ref 13.0–17.0)
Immature Granulocytes: 1 %
Lymphocytes Relative: 16 %
Lymphs Abs: 1.6 10*3/uL (ref 0.7–4.0)
MCH: 32 pg (ref 26.0–34.0)
MCHC: 32.9 g/dL (ref 30.0–36.0)
MCV: 97.1 fL (ref 80.0–100.0)
Monocytes Absolute: 0.9 10*3/uL (ref 0.1–1.0)
Monocytes Relative: 9 %
Neutro Abs: 7.1 10*3/uL (ref 1.7–7.7)
Neutrophils Relative %: 70 %
Platelets: 354 10*3/uL (ref 150–400)
RBC: 4.13 MIL/uL — ABNORMAL LOW (ref 4.22–5.81)
RDW: 14.8 % (ref 11.5–15.5)
WBC: 10.1 10*3/uL (ref 4.0–10.5)
nRBC: 0 % (ref 0.0–0.2)

## 2022-03-24 LAB — COMPREHENSIVE METABOLIC PANEL
ALT: 13 U/L (ref 0–44)
AST: 29 U/L (ref 15–41)
Albumin: 3.8 g/dL (ref 3.5–5.0)
Alkaline Phosphatase: 54 U/L (ref 38–126)
Anion gap: 11 (ref 5–15)
BUN: 5 mg/dL — ABNORMAL LOW (ref 8–23)
CO2: 28 mmol/L (ref 22–32)
Calcium: 9.3 mg/dL (ref 8.9–10.3)
Chloride: 102 mmol/L (ref 98–111)
Creatinine, Ser: 1.01 mg/dL (ref 0.61–1.24)
GFR, Estimated: >60 mL/min (ref >60)
Glucose, Bld: 95 mg/dL (ref 70–99)
Potassium: 4.2 mmol/L (ref 3.5–5.1)
Sodium: 141 mmol/L (ref 135–145)
Total Bilirubin: 0.6 mg/dL (ref 0.3–1.2)
Total Protein: 6.1 g/dL — ABNORMAL LOW (ref 6.5–8.1)

## 2022-03-24 LAB — URINALYSIS, ROUTINE W REFLEX MICROSCOPIC
Bilirubin Urine: NEGATIVE
Glucose, UA: NEGATIVE mg/dL
Hgb urine dipstick: NEGATIVE
Ketones, ur: 5 mg/dL — AB
Leukocytes,Ua: NEGATIVE
Nitrite: NEGATIVE
Protein, ur: NEGATIVE mg/dL
Specific Gravity, Urine: 1.012 (ref 1.005–1.030)
pH: 6 (ref 5.0–8.0)

## 2022-03-24 MED ORDER — LACTATED RINGERS IV BOLUS
1000.0000 mL | Freq: Once | INTRAVENOUS | Status: AC
  Administered 2022-03-24: 1000 mL via INTRAVENOUS

## 2022-03-24 NOTE — ED Triage Notes (Signed)
Pt BIB [facility] from home, pt went to the store and was walking up his steps when he fell backward and hit the back L side of his head on concrete. Denies taking thinners. Lac to L ear noted, bleeding controlled. Ems endorses confusion that has improved en route. HR 46 with PVCs.

## 2022-03-24 NOTE — ED Provider Notes (Signed)
[facility] EMERGENCY DEPARTMENT Provider Note  CSN: [id number] Arrival date & time: 03/24/22 1831  Chief Complaint(s) Fall  HPI Angel [name] is a 71 y.o. male with PMH head and neck cancer status post radiation therapy currently in remission who presents to the emergency department for evaluation of a syncopal event with head trauma.  Patient does not take any blood thinning medications.  Wife states that patient was driving back from the grocery store, backed the car into the garage, quickly got out of the car and was unloading groceries when he walked onto the first step of the garage and had a syncopal event falling backwards and striking his head.  Patient had no involuntary urination or defecation, no tongue bite and return to normal mental status baseline within 1 minute.  No associated chest pain, shortness of breath, abdominal pain, nausea, vomiting prior to the incident or here in the emergency department.   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   Left Base of Tongue and Vallecula   S/P radiation therapy 01/26/2014-03/18/2014   base of tongue, bilateral neck/ 70 Gy/35 fx,    Thrush of mouth and esophagus ([place]) 02/21/2014   Patient Active Problem List   Diagnosis Date Noted   Acquired hypothyroidism 02/14/2017   Essential hypertension 02/14/2017   Lymphedema 05/05/2014   Headache 01/25/2014   Preventive measure 01/01/2014   Diarrhea 11/15/2013   Bilateral leg edema 11/15/2013   Insomnia 11/12/2013   Hypomagnesemia 11/12/2013   Generalized weakness 10/27/2013   Fall 10/27/2013   Thrombocytosis 10/27/2013   Leukocytosis, unspecified 10/25/2013    Anemia in neoplastic disease 10/05/2013   Cancer of base of tongue ([place]) 09/17/2013   Home Medication(s) Prior to Admission medications   Medication Sig Start Date End Date Taking? Authorizing Provider  cholecalciferol (VITAMIN D) 25 MCG (1000 UNIT) tablet Take 1,000 Units by mouth daily.    [provider]  diazepam (VALIUM) 5 MG tablet Take 1 tablet by mouth daily as needed for headache.    [provider]  doxycycline (VIBRA-TABS) 100 MG tablet Take 100 mg by mouth 2 (two) times daily.    [provider]  escitalopram (LEXAPRO) 10 MG tablet Take 10 mg by mouth daily. 10/01/21   [provider]  EUTHYROX 75 MCG tablet Take 75 mcg by mouth daily. 06/10/19   [provider]  methocarbamol (ROBAXIN) 500 MG tablet Take 500 mg by mouth every 4 (four) hours as needed for muscle spasms.     [provider]  Multiple Vitamin (MULTIVITAMIN WITH MINERALS) TABS tablet Take 1 tablet by mouth daily. Centrum 50+    [provider]  mupirocin cream (BACTROBAN) 2 % Apply 1 Application topically 3 (three) times daily.    [provider]  oxyCODONE-acetaminophen (PERCOCET) 5-325 MG tablet Take 1 tablet by mouth every 4 (four) hours as needed for severe pain. 11/26/21 11/26/22  Stechschulte, Nickola Major, MD  Red Yeast Rice 600 MG TABS Take 600 mg by mouth daily.    [provider]  traMADol (ULTRAM) 50 MG tablet Take 50 mg by mouth every 4 (four) hours as needed  for moderate pain or severe pain.    [provider]                                                                                                                                    Past Surgical History Past Surgical History:  Procedure Laterality Date   APPENDECTOMY     BACK SURGERY     Lumbar   COLONOSCOPY WITH PROPOFOL N/A 10/16/2018   Procedure: COLONOSCOPY WITH PROPOFOL;  Surgeon: Carol Ada, MD;  [facility];  Service: Endoscopy;  Laterality: N/A;    ESOPHAGOSCOPY  10/04/2013   Procedure: ESOPHAGOSCOPY;  Surgeon: Melida Quitter, MD;  Location: [place];  Service: ENT;;   INGUINAL HERNIA REPAIR Left 11/26/2021   Procedure: LAPAROSCOPIC LEFT INGUINAL HERNIA REPAIR WITH MESH;  Surgeon: Thermon Leyland Nickola Major, MD;  Location[facility];  Service: General;  Laterality: Left;   MANDIBLE SURGERY     from [place] N/A 10/08/2013   Procedure: extraction of tooth #'s 2,3,4,5,6,11,12,13,14,15,18,19,20,21,22,23,24,25,26, 27,28, 29, 31 with alveoloplasty ;  Surgeon: Lenn Cal, DDS;  Location: [place];  Service: Oral Surgery;  Laterality: N/A;   PANENDOSCOPY N/A 10/04/2013   Procedure: Direct Laryngoscopy  WITH BIOPSY;  Surgeon: Melida Quitter, MD;  Location: [place];  Service: ENT;  Laterality: N/A;   TESTICLE SURGERY     as a infant   TONSILLECTOMY     UMBILICAL HERNIA REPAIR N/A 11/26/2021   Procedure: [place];  Surgeon: Felicie Morn, MD;  Location[facility];  Service: General;  Laterality: N/A;   Family History Family History  Problem Relation Age of Onset   Cancer Mother        Lung   Cancer Father        ? Lung    Social History Social History   Tobacco Use   Smoking status: Former    Packs/day: 0.25    Years: 45.00    Total pack years: 11.25    Types: Cigarettes    Quit date: 2019    Years since quitting: 4.9   Smokeless tobacco: Never   Tobacco comments:    reports using 2 cigarettes a day  Vaping Use   Vaping Use: Never used  Substance Use Topics   Alcohol use: Not Currently    Comment: "Heavy" Alcohol Use in the Past   Drug use: Not Currently    Types: Marijuana   Allergies Patient has no known allergies.  Review of Systems Review of Systems  Neurological:  Positive for syncope and headaches.    Physical Exam Vital Signs  I have reviewed the triage vital signs BP (!) 168/100   Pulse 60   Temp 97.9 F (36.6 C) (Oral)   Resp 13   Ht 5' 5.5" (1.664 m)   Wt  54.4 kg   SpO2 99%   BMI 19.67 kg/m  Physical Exam Constitutional:      General: He is not in acute distress.    Appearance: Normal appearance.  HENT:     Head: Normocephalic.     Comments: Abrasion to left ear, hematoma to the mastoid on the left    Nose: No congestion or rhinorrhea.  Eyes:     General:        Right eye: No discharge.        Left eye: No discharge.     Extraocular Movements: Extraocular movements intact.     Pupils: Pupils are equal, round, and reactive to light.  Cardiovascular:     Rate and Rhythm: Normal rate and regular rhythm.     Heart sounds: No murmur heard. Pulmonary:     Effort: No respiratory distress.     Breath sounds: No wheezing or rales.  Abdominal:     General: There is no distension.     Tenderness: There is no abdominal tenderness.  Musculoskeletal:        General: Normal range of motion.     Cervical back: Normal range of motion.  Skin:    General: Skin is warm and dry.  Neurological:     General: No focal deficit present.     Mental Status: He is alert.     ED Results and Treatments Labs (all labs ordered are listed, but only abnormal results are displayed) Labs Reviewed  COMPREHENSIVE METABOLIC PANEL  CBC WITH DIFFERENTIAL/PLATELET  URINALYSIS, ROUTINE W REFLEX MICROSCOPIC                                                                                                                          Radiology CT Head Wo Contrast  Result Date: 03/24/2022 CLINICAL DATA:  Recent fall with headaches and neck pain, initial encounter EXAM: CT HEAD WITHOUT CONTRAST CT CERVICAL SPINE WITHOUT CONTRAST TECHNIQUE: Multidetector CT imaging of the head and cervical spine was performed following the standard protocol without intravenous contrast. Multiplanar CT image reconstructions of the cervical spine were also generated. RADIATION DOSE REDUCTION: This exam was performed according to the departmental dose-optimization program which includes  automated exposure control, adjustment of the mA and/or kV according to patient size and/or use of iterative reconstruction technique. COMPARISON:  None Available. FINDINGS: CT HEAD FINDINGS Brain: No evidence of acute infarction, hemorrhage, hydrocephalus, extra-axial collection or mass lesion/mass effect. Chronic atrophic and ischemic changes are noted. Vascular: No hyperdense vessel or unexpected calcification. Skull: Normal. Negative for fracture or focal lesion. Sinuses/Orbits: No acute finding. Other: None. CT CERVICAL SPINE FINDINGS Alignment: Loss of normal cervical lordosis is noted with kyphosis focused at C6. Skull base and vertebrae: 7 cervical segments are well visualized. Vertebral body height is well maintained. No acute fracture or acute facet abnormality is noted. Osteophytic changes are seen as well as facet hypertrophic changes. Soft tissues and spinal canal: Surrounding soft tissue structures are within normal limits. Upper chest: Lung apices demonstrate diffuse emphysematous changes and apical  scarring. Other: None IMPRESSION: CT of the head: No acute intracranial abnormality noted. Chronic atrophic and ischemic changes are seen. CT of the cervical spine: Multilevel degenerative changes without acute bony abnormality. Electronically Signed   By: Inez Catalina M.D.   On: 03/24/2022 20:24   CT Cervical Spine Wo Contrast  Result Date: 03/24/2022 CLINICAL DATA:  Recent fall with headaches and neck pain, initial encounter EXAM: CT HEAD WITHOUT CONTRAST CT CERVICAL SPINE WITHOUT CONTRAST TECHNIQUE: Multidetector CT imaging of the head and cervical spine was performed following the standard protocol without intravenous contrast. Multiplanar CT image reconstructions of the cervical spine were also generated. RADIATION DOSE REDUCTION: This exam was performed according to the departmental dose-optimization program which includes automated exposure control, adjustment of the mA and/or kV according to  patient size and/or use of iterative reconstruction technique. COMPARISON:  None Available. FINDINGS: CT HEAD FINDINGS Brain: No evidence of acute infarction, hemorrhage, hydrocephalus, extra-axial collection or mass lesion/mass effect. Chronic atrophic and ischemic changes are noted. Vascular: No hyperdense vessel or unexpected calcification. Skull: Normal. Negative for fracture or focal lesion. Sinuses/Orbits: No acute finding. Other: None. CT CERVICAL SPINE FINDINGS Alignment: Loss of normal cervical lordosis is noted with kyphosis focused at C6. Skull base and vertebrae: 7 cervical segments are well visualized. Vertebral body height is well maintained. No acute fracture or acute facet abnormality is noted. Osteophytic changes are seen as well as facet hypertrophic changes. Soft tissues and spinal canal: Surrounding soft tissue structures are within normal limits. Upper chest: Lung apices demonstrate diffuse emphysematous changes and apical scarring. Other: None IMPRESSION: CT of the head: No acute intracranial abnormality noted. Chronic atrophic and ischemic changes are seen. CT of the cervical spine: Multilevel degenerative changes without acute bony abnormality. Electronically Signed   By: Inez Catalina M.D.   On: 03/24/2022 20:24    Pertinent labs & imaging results that were available during my care of the patient were reviewed by me and considered in my medical decision making (see MDM for details).  Medications Ordered in ED Medications - No data to display                                                                                                                                   Procedures Procedures  (including critical care time)  Medical Decision Making / ED Course   This patient presents to the ED for concern of syncope, head trauma, this involves an extensive number of treatment options, and is a complaint that carries with it a high risk of complications and morbidity.  The  differential diagnosis includes orthostatic syncope, vasovagal syncope, cardiogenic syncope, closed head injury, hematoma, skull fracture, ICH, dehydration  MDM: Patient seen emergency room for evaluation of a syncopal episode with head trauma.  Physical exam with an abrasion to the helix of the left ear as well as small hematomas to the mastoid and temporal regions.  Trauma  imaging reassuringly unremarkable.  Laboratory evaluation also unremarkable.  ECG with sinus bradycardia but no significant heart block, no arrhythmia, no WPW or Brugada.  Patient received 1 L lactated Ringer's and did well with orthostatic vital signs.  Nursing notes do indicate the patient was swaying back-and-forth while standing with his eyes closed when standing.  The patient states that while standing he was closing his eyes because he states "that is just what I do sometimes".  He adamantly denies that he was dizzy while standing and I repeated this test with him at bedside and did not see any evidence of instability or lightheadedness.  This is the patient's first syncopal episode ever and with a reassuring workup here in the emergency department, suspect an element of dehydration given the patient drinks only coffee and does not drink any water throughout the day.  I placed an amatory referral to cardiology for possible Zio patch placement and patient discharged with strict return precautions and outpatient follow-up.   Additional history obtained: -Additional history obtained from multiple family members -External records from outside source obtained and reviewed including: Chart review including previous notes, labs, imaging, consultation notes   Lab Tests: -I ordered, reviewed, and interpreted labs.   The pertinent results include:   Labs Reviewed  COMPREHENSIVE METABOLIC PANEL  CBC WITH DIFFERENTIAL/PLATELET  URINALYSIS, ROUTINE W REFLEX MICROSCOPIC      EKG  EKG Interpretation  Date/Time:  Sunday March 24 2022 18:37:44 EST Ventricular Rate:  50 PR Interval:  158 QRS Duration: 90 QT Interval:  447 QTC Calculation: 408 R Axis:   35 Text Interpretation: Sinus rhythm Probable left atrial enlargement Anteroseptal infarct, age indeterminate Confirmed by Cardell Rachel [phone]) on 03/24/2022 7:02:33 PM         Imaging Studies ordered: I ordered imaging studies including CT head, C-spine I independently visualized and interpreted imaging. I agree with the radiologist interpretation   Medicines ordered and prescription drug management: No orders of the defined types were placed in this encounter.   -I have reviewed the patients home medicines and have made adjustments as needed  Critical interventions none    Cardiac Monitoring: The patient was maintained on a cardiac monitor.  I personally viewed and interpreted the cardiac monitored which showed an underlying rhythm of: NSR, sinus bradycardia  Social Determinants of Health:  Factors impacting patients care include: none   Reevaluation: After the interventions noted above, I reevaluated the patient and found that they have :improved  Co morbidities that complicate the patient evaluation  Past Medical History:  Diagnosis Date   Constipation due to pain medication    Current smoker    Hypomagnesemia 11/12/2013   Insomnia 11/12/2013   Lymphedema 05/05/2014   Malignant neoplasm of base of tongue ([place])    left neck lymph and Left BOT cancer   Metastasis to lymph nodes ([place]) 10/06/2013   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

## 2022-03-24 NOTE — ED Notes (Signed)
Left ear bandaged.

## 2022-03-24 NOTE — ED Notes (Signed)
Patient verbalizes understanding of discharge instructions. Opportunity for questioning and answers were provided. Armband removed by staff, pt discharged from ED. Pt taken to ED waiting room via wheel chair.  

## 2022-03-24 NOTE — ED Notes (Signed)
Patient denies dizziness going from sitting to standing, but patient did sway back and forth while standing with his eyes closed.

## 2022-05-15 ENCOUNTER — Ambulatory Visit: Payer: Medicare HMO | Admitting: Cardiology

## 2022-07-01 DIAGNOSIS — Z0000 Encounter for general adult medical examination without abnormal findings: Secondary | ICD-10-CM | POA: Diagnosis not present

## 2022-07-01 DIAGNOSIS — Z125 Encounter for screening for malignant neoplasm of prostate: Secondary | ICD-10-CM | POA: Diagnosis not present

## 2022-07-01 DIAGNOSIS — R739 Hyperglycemia, unspecified: Secondary | ICD-10-CM | POA: Diagnosis not present

## 2022-07-01 DIAGNOSIS — E039 Hypothyroidism, unspecified: Secondary | ICD-10-CM | POA: Diagnosis not present

## 2022-07-01 DIAGNOSIS — R972 Elevated prostate specific antigen [PSA]: Secondary | ICD-10-CM | POA: Diagnosis not present

## 2022-07-01 DIAGNOSIS — E785 Hyperlipidemia, unspecified: Secondary | ICD-10-CM | POA: Diagnosis not present

## 2022-07-01 DIAGNOSIS — I10 Essential (primary) hypertension: Secondary | ICD-10-CM | POA: Diagnosis not present

## 2022-07-08 ENCOUNTER — Other Ambulatory Visit (HOSPITAL_COMMUNITY): Payer: Self-pay

## 2022-07-08 DIAGNOSIS — F331 Major depressive disorder, recurrent, moderate: Secondary | ICD-10-CM | POA: Diagnosis not present

## 2022-07-08 DIAGNOSIS — E785 Hyperlipidemia, unspecified: Secondary | ICD-10-CM

## 2022-07-08 DIAGNOSIS — Z0000 Encounter for general adult medical examination without abnormal findings: Secondary | ICD-10-CM | POA: Diagnosis not present

## 2022-07-08 DIAGNOSIS — I10 Essential (primary) hypertension: Secondary | ICD-10-CM | POA: Diagnosis not present

## 2022-07-08 DIAGNOSIS — Z23 Encounter for immunization: Secondary | ICD-10-CM | POA: Diagnosis not present

## 2022-07-08 DIAGNOSIS — E559 Vitamin D deficiency, unspecified: Secondary | ICD-10-CM | POA: Diagnosis not present

## 2022-07-08 DIAGNOSIS — E039 Hypothyroidism, unspecified: Secondary | ICD-10-CM | POA: Diagnosis not present

## 2022-07-22 ENCOUNTER — Ambulatory Visit: Payer: Medicare HMO | Admitting: Cardiology

## 2022-07-22 ENCOUNTER — Other Ambulatory Visit: Payer: Medicare HMO

## 2022-07-22 ENCOUNTER — Encounter: Payer: Self-pay | Admitting: Cardiology

## 2022-07-22 VITALS — BP 158/83 | HR 58 | Ht 65.5 in | Wt 117.0 lb

## 2022-07-22 DIAGNOSIS — R55 Syncope and collapse: Secondary | ICD-10-CM

## 2022-07-22 DIAGNOSIS — R0989 Other specified symptoms and signs involving the circulatory and respiratory systems: Secondary | ICD-10-CM | POA: Diagnosis not present

## 2022-07-22 DIAGNOSIS — I739 Peripheral vascular disease, unspecified: Secondary | ICD-10-CM | POA: Diagnosis not present

## 2022-07-22 DIAGNOSIS — I25118 Atherosclerotic heart disease of native coronary artery with other forms of angina pectoris: Secondary | ICD-10-CM

## 2022-07-22 DIAGNOSIS — E7800 Pure hypercholesterolemia, unspecified: Secondary | ICD-10-CM | POA: Diagnosis not present

## 2022-07-22 MED ORDER — ROSUVASTATIN CALCIUM 20 MG PO TABS: 20.0000 mg | ORAL_TABLET | Freq: Every day | ORAL | 2 refills | Status: DC

## 2022-07-22 MED ORDER — ASPIRIN 81 MG PO CHEW: 81.0000 mg | CHEWABLE_TABLET | Freq: Every day | ORAL | Status: DC

## 2022-07-22 NOTE — Progress Notes (Signed)
Primary Physician/Referring:  Deon Pilling, NP  Patient ID: Angel [name], male    DOB: 04-Sep-1950, 72 y.o.   MRN: [id number]  Chief Complaint  Patient presents with   Loss of Consciousness   New Patient (Initial Visit)   HPI:    Angel [name]  is a 72 y.o. extremely complex patient with longtime history of tobacco use disorder quit in 2018, still uses marijuana, referred to me for evaluation of syncope that occurred on 03/24/2022 and presented to the emergency room.  Poor historian.  [name], patient has no recollection.  Patient's wife is present and corroborates the history.  Patient had returned from grocery store and was holding the groceries in and and while he was climbing up once stop, suddenly fell backwards.  He was slightly confused upon falling but was aware immediately was awake immediately, was taken to the ED where CT scan of the head was negative EKG was normal and discharged home.  Patient's wife states that about a month ago approximately 06/23/2022, while in the kitchen, he had a cup of coffee in his head and send he was about to pass out and she had them and patient spilled all the coffee on wife's chest, she took him to the chair and made him laid down.  Patient does not recollect any of this.  Patient has chronic shortness of breath and dyspnea on exertion.  Denies symptoms of chest pain, palpitations.  He denies dizziness but states that he has coordination problems with his feet and sometimes sways to the left or to the right.  Denies any cramping in his legs.  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   Left Base of Tongue and Vallecula   S/P radiation therapy 01/26/2014-03/18/2014   base of tongue, bilateral neck/ 70 Gy/35 fx,    Thrush of mouth and esophagus ([place]) 02/21/2014   Past Surgical History:  Procedure Laterality Date   APPENDECTOMY     BACK SURGERY     Lumbar   COLONOSCOPY WITH PROPOFOL N/A 10/16/2018   Procedure: COLONOSCOPY WITH PROPOFOL;  Surgeon: Carol Ada, MD;  Location: [facility];  Service: Endoscopy;  Laterality: N/A;   ESOPHAGOSCOPY  10/04/2013   Procedure: ESOPHAGOSCOPY;  Surgeon: Melida Quitter, MD;  Location: [place];  Service: ENT;;   INGUINAL HERNIA REPAIR Left 11/26/2021   Procedure: LAPAROSCOPIC LEFT INGUINAL HERNIA REPAIR WITH MESH;  Surgeon: Thermon Leyland Nickola Major, MD;  Location: [facility];  Service: General;  Laterality: Left;   MANDIBLE SURGERY     from [place] N/A 10/08/2013   Procedure: extraction of tooth #'s 2,3,4,5,6,11,12,13,14,15,18,19,20,21,22,23,24,25,26, 27,28, 29, 31 with alveoloplasty ;  Surgeon: Lenn Cal, DDS;  Location: [place];  Service: Oral Surgery;  Laterality: N/A;   PANENDOSCOPY N/A 10/04/2013   Procedure: Direct Laryngoscopy  WITH BIOPSY;  Surgeon: Melida Quitter, MD;  Location: [place];  Service: ENT;  Laterality: N/A;   TESTICLE SURGERY     as a infant   TONSILLECTOMY  UMBILICAL HERNIA REPAIR N/A 11/26/2021   Procedure: UMBILICAL HERNIA REPAIR;  Surgeon: Felicie Morn, MD;  Location: [facility];  Service: General;  Laterality: N/A;   Family History  Problem Relation Age of Onset   Hypertension Mother    Cancer Mother        Lung   Cancer Father        ? Lung    Social History   Tobacco Use   Smoking status: Former    Packs/day: 0.25    Years: 45.00    Additional pack years: 0.00    Total pack years: 11.25    Types: Cigarettes    Quit date: 2019    Years since quitting: 5.2   Smokeless tobacco: Never   Tobacco comments:     reports using 2 cigarettes a day  Substance Use Topics   Alcohol use: Not Currently    Comment: "Heavy" Alcohol Use in the Past   Marital Status: Married  ROS  Review of Systems  Constitutional: Positive for weight loss.  Cardiovascular:  Positive for dyspnea on exertion and syncope. Negative for chest pain, leg swelling and orthopnea.  Neurological:  Positive for disturbances in coordination.   Objective      07/22/2022    9:26 AM 03/24/2022   10:30 PM 03/24/2022   10:17 PM  Vitals with BMI  Height 5' 5.5"    Weight 117 lbs    BMI [id number]    Systolic 0000000 0000000 [id number]  Diastolic 83 87 86  Pulse 58 60 73   SpO2: 97 %  Orthostatic VS for the past 72 hrs (Last 3 readings):  Orthostatic BP Patient Position BP Location Cuff Size Orthostatic Pulse  07/22/22 0931 137/75 Standing Left Arm Normal 58  07/22/22 0930 142/81 Sitting Left Arm Normal 54  07/22/22 0928 145/74 Supine Left Arm Normal 54    Physical Exam Constitutional:      Appearance: He is underweight.  Neck:     Vascular: No carotid bruit or JVD.  Cardiovascular:     Rate and Rhythm: Normal rate and regular rhythm.     Pulses:          Femoral pulses are 1+ on the right side and 0 on the left side.      Popliteal pulses are 0 on the right side and 0 on the left side.       Dorsalis pedis pulses are 0 on the right side and 0 on the left side.       Posterior tibial pulses are 0 on the right side and 0 on the left side.     Heart sounds: Normal heart sounds. No murmur heard.    No gallop.     Comments: Abdominal bruit Pulmonary:     Effort: Pulmonary effort is normal.     Breath sounds: Normal breath sounds.  Abdominal:     General: Bowel sounds are normal.     Palpations: Abdomen is soft.  Musculoskeletal:     Right lower leg: No edema.     Left lower leg: No edema.    Laboratory examination:   Recent Labs    11/26/21 0650 03/24/22 1901  NA 142 141  K 3.6 4.2  CL 108 102  CO2 27 28  GLUCOSE 102* 95   BUN 6* 5*  CREATININE 0.66 1.01  CALCIUM 9.6 9.3  GFRNONAA >60 >60    Lab Results  Component Value Date   GLUCOSE 95 03/24/2022  NA 141 03/24/2022   K 4.2 03/24/2022   CL 102 03/24/2022   CO2 28 03/24/2022   BUN 5 (L) 03/24/2022   CREATININE 1.01 03/24/2022   GFRNONAA >60 03/24/2022   CALCIUM 9.3 03/24/2022   PROT 6.1 (L) 03/24/2022   ALBUMIN 3.8 03/24/2022   BILITOT 0.6 03/24/2022   ALKPHOS 54 03/24/2022   AST 29 03/24/2022   ALT 13 03/24/2022   ANIONGAP 11 03/24/2022      Lab Results  Component Value Date   ALT 13 03/24/2022   AST 29 03/24/2022   ALKPHOS 54 03/24/2022   BILITOT 0.6 03/24/2022       Latest Ref Rng & Units 03/24/2022    7:01 PM 02/15/2019    8:24 AM 02/12/2018    9:43 AM  Hepatic Function  Total Protein 6.5 - 8.1 g/dL 6.1  7.0  7.3   Albumin 3.5 - 5.0 g/dL 3.8  4.3  4.1   AST 15 - 41 U/L 29  21  20    ALT 0 - 44 U/L 13  13  12    Alk Phosphatase 38 - 126 U/L 54  57  80   Total Bilirubin 0.3 - 1.2 mg/dL 0.6  0.4  0.5     External labs:   Labs 07/02/2022:  Total cholesterol at 124, triglycerides 91, HDL 74, LDL 132.  Non-HDL cholesterol 150.  TSH elevated at 7.05.  Free thyroxine 1.45, normal.  Sodium 143, potassium 4.3, BUN 7, creatinine 0.94, EGFR 80 to mL, LFTs normal.  Hb 14.2/HCT 42.4, platelets 367.  Radiology:   CT angiogram chest 08/18/2015: 1.  No acute process or evidence of metastatic disease in the chest. 2. No explanation for weight loss. 3. Mild distal esophageal wall thickening again suspected. Question esophagitis. 4.  Aortic Atherosclerosis, Normal heart size, without pericardial effusion. Lad coronary artery atherosclerosis.   Cardiac Studies:    EKG:   EKG 07/22/2022: Normal sinus rhythm at rate of 53 bpm, normal EKG.   Medications and allergies  No Known Allergies   Medication list   Current Outpatient Medications:    aspirin (ASPIRIN CHILDRENS) 81 MG chewable tablet, Chew 1 tablet (81 mg total) by mouth  daily., Disp: , Rfl:    cholecalciferol (VITAMIN D) 25 MCG (1000 UNIT) tablet, Take 1,000 Units by mouth daily., Disp: , Rfl:    diazepam (VALIUM) 5 MG tablet, Take 1 tablet by mouth daily as needed for headache., Disp: , Rfl:    escitalopram (LEXAPRO) 10 MG tablet, Take 10 mg by mouth daily., Disp: , Rfl:    EUTHYROX 75 MCG tablet, Take 75 mcg by mouth daily., Disp: , Rfl:    methocarbamol (ROBAXIN) 500 MG tablet, Take 500 mg by mouth every 4 (four) hours as needed for muscle spasms. , Disp: , Rfl:    Multiple Vitamin (MULTIVITAMIN WITH MINERALS) TABS tablet, Take 1 tablet by mouth daily. Centrum 50+, Disp: , Rfl:    rosuvastatin (CRESTOR) 20 MG tablet, Take 1 tablet (20 mg total) by mouth daily., Disp: 30 tablet, Rfl: 2   traMADol (ULTRAM) 50 MG tablet, Take 50 mg by mouth every 4 (four) hours as needed for moderate pain or severe pain., Disp: , Rfl:   Assessment     ICD-10-CM   1. Syncope and collapse  R55 EKG 12-Lead    [facility] ECHOCARDIOGRAM COMPLETE    [facility] MYOCARDIAL PERFUSION WITH LEXISCAN    LONG TERM MONITOR (3-14 DAYS)    2. Hypercholesteremia  E78.00 rosuvastatin (  CRESTOR) 20 MG tablet    3. Atherosclerosis of native coronary artery of native heart with other form of angina pectoris ([place])  I25.118 PCV ECHOCARDIOGRAM COMPLETE    PCV MYOCARDIAL PERFUSION WITH LEXISCAN    aspirin (ASPIRIN CHILDRENS) 81 MG chewable tablet    4. Peripheral artery disease (HCC)  I73.9 PCV LOWER ARTERIAL (BILATERAL)    PCV ANKLE BRACHIAL INDEX (ABI)    aspirin (ASPIRIN CHILDRENS) 81 MG chewable tablet    5. Abdominal bruit  R09.89 PCV AORTA DUPLEX       Orders Placed This Encounter  Procedures   PCV MYOCARDIAL PERFUSION WITH LEXISCAN    Standing Status:   Future    Standing Expiration Date:   09/21/2022   LONG TERM MONITOR (3-14 DAYS)    Standing Status:   Future    Standing Expiration Date:   07/22/2023    Order Specific Question:   Where should this test be performed?    Answer:    [facility]    Order Specific Question:   Does the patient have an implanted cardiac device?    Answer:   No    Order Specific Question:   Prescribed days of wear    Answer:   9    Order Specific Question:   Type of enrollment    Answer:   Clinic Enrollment    Order Specific Question:   Release to patient    Answer:   Immediate   EKG 12-Lead   PCV ECHOCARDIOGRAM COMPLETE    Standing Status:   Future    Standing Expiration Date:   07/22/2023    Meds ordered this encounter  Medications   rosuvastatin (CRESTOR) 20 MG tablet    Sig: Take 1 tablet (20 mg total) by mouth daily.    Dispense:  30 tablet    Refill:  2   aspirin (ASPIRIN CHILDRENS) 81 MG chewable tablet    Sig: Chew 1 tablet (81 mg total) by mouth daily.    Medications Discontinued During This Encounter  Medication Reason   mupirocin cream (BACTROBAN) 2 % Completed Course   oxyCODONE-acetaminophen (PERCOCET) 5-325 MG tablet No longer needed (for PRN medications)   doxycycline (VIBRA-TABS) 100 MG tablet    Red Yeast Rice 600 MG TABS Discontinued by provider     Recommendations:   Angel [name] is a 72 y.o. extremely complex patient with longtime history of tobacco use disorder quit in 2018, still uses marijuana, referred to me for evaluation of syncope that occurred on 03/24/2022 and presented to the emergency room.  Poor historian.  [name], patient has no recollection.  1. Syncope and collapse Patient presentation of syncope and collapse is concerning for cardiac syncope.  His history is very difficult to elucidate.  I reviewed his prior evaluations, with significant of coronary calcification that was noted previously, tobacco use disorder, uncontrolled hyperlipidemia, he is certainly at high risk for cardiac events and arrhythmia related syncope.  Below orders were placed today. - EKG  12-Lead - PCV ECHOCARDIOGRAM COMPLETE; Future - PCV MYOCARDIAL PERFUSION WITH LEXISCAN; Future - LONG TERM MONITOR (3-14 DAYS); Future  2. Hypercholesteremia I have discontinued red yeast rice, in view of high risk features, placed him on rosuvastatin. - rosuvastatin (CRESTOR) 20 MG tablet; Take 1 tablet (20 mg total) by mouth daily.  Dispense: 30 tablet; Refill: 2  3. Atherosclerosis  of native coronary artery of native heart with other forms of angina pectoris Patient's CT scan of the chest in 2019 are already revealed significant amount of coronary calcification.  His dyspnea on exertion may be anginal equivalent.  His activity is limited and although he has severe peripheral artery disease by examination, denies any specific complaints today of claudication or chest pain. - PCV ECHOCARDIOGRAM COMPLETE; Future - PCV MYOCARDIAL PERFUSION WITH LEXISCAN; Future -Aspirin 81 mg daily  4. Peripheral artery disease ([place]) He has abdominal bruit, markedly reduced pulses in bilateral lower extremity, the only pulse felt was left femoral even at a low amplitude.  Suspect he may also have Leriche's syndrome, patient has had significant weight loss in the past and this may be related to abdominal aortic stenosis. - PCV LOWER ARTERIAL (BILATERAL); Future - PCV ANKLE BRACHIAL INDEX (ABI); Future -Aspirin 81 mg daily  5. Abdominal bruit I would like to exclude abdominal aortic stenosis.  His weight loss could have been related to abdominal aortic atherosclerosis as well. - PCV AORTA DUPLEX; Future  Office visit in 4 to 6 weeks for follow-up, advised him to start aspirin 81 mg daily.  Advised him not to drive until evaluation is complete.   Adrian Prows, MD, [facility] 07/22/2022, 10:20 AM Office: [phone]

## 2022-07-26 ENCOUNTER — Encounter (HOSPITAL_COMMUNITY): Payer: Self-pay

## 2022-07-26 ENCOUNTER — Ambulatory Visit (HOSPITAL_COMMUNITY)
Admission: RE | Admit: 2022-07-26 | Discharge: 2022-07-26 | Disposition: A | Discharge status: Home / Self Care | Payer: Medicare HMO | Source: Ambulatory Visit

## 2022-07-26 DIAGNOSIS — E785 Hyperlipidemia, unspecified: Secondary | ICD-10-CM | POA: Insufficient documentation

## 2022-08-05 ENCOUNTER — Ambulatory Visit: Payer: Medicare HMO

## 2022-08-05 DIAGNOSIS — R55 Syncope and collapse: Secondary | ICD-10-CM

## 2022-08-05 DIAGNOSIS — I25118 Atherosclerotic heart disease of native coronary artery with other forms of angina pectoris: Secondary | ICD-10-CM | POA: Diagnosis not present

## 2022-08-07 NOTE — Progress Notes (Signed)
Regadenoson (with Mod Bruce protocol) Nuclear stress test 08/05/2022 Myocardial perfusion is normal. There is some apical thinning present. Overall LV systolic function is normal without regional wall motion abnormalities. Stress LV EF: 55%. Low risk study. Nondiagnostic ECG stress. The heart rate response was consistent with Regadenoson. The blood pressure response was physiologic. No previous exam available for comparison.

## 2022-08-12 ENCOUNTER — Telehealth: Payer: Self-pay

## 2022-08-12 DIAGNOSIS — R55 Syncope and collapse: Secondary | ICD-10-CM | POA: Diagnosis not present

## 2022-08-12 NOTE — Telephone Encounter (Signed)
Zio calling with abnormal zio patch results.   Notification is for Rapid A-Fib, HR was 208 bpm lasting 60 seconds. This will be on pages 12-13 on strip #9.  It is also posted on the [employer].

## 2022-08-12 NOTE — Telephone Encounter (Signed)
Zio Patch Extended out patient EKG monitoring 14 days starting 07/22/2022:  Predominant Rhythm : Normal sinus rhythm.  Min HR: 41 bpm at 2:15 AM. Max HR 139 bpm at 3:30 PM  Atrial arrhythmias: Brief atrial tachycardia episodes for a number.  Atrial fibrillation: There were 6 atrial fibrillation episodes, 3 episodes brief <6 minutes, longest episode 1 hour 50 minutes.  There was no postconversion pause.  Atrial fibrillation burden <1%.  Ventricular arrhythmias: Occasional PVCs and rare ventricular couplets.Marland Kitchen PVC Burden <0.1%  Heart Block: None  Symptoms: No symptoms reported

## 2022-08-14 DIAGNOSIS — R931 Abnormal findings on diagnostic imaging of heart and coronary circulation: Secondary | ICD-10-CM | POA: Diagnosis not present

## 2022-08-14 DIAGNOSIS — E785 Hyperlipidemia, unspecified: Secondary | ICD-10-CM | POA: Diagnosis not present

## 2022-08-14 DIAGNOSIS — J9859 Other diseases of mediastinum, not elsewhere classified: Secondary | ICD-10-CM | POA: Diagnosis not present

## 2022-08-16 DIAGNOSIS — R55 Syncope and collapse: Secondary | ICD-10-CM | POA: Diagnosis not present

## 2022-08-18 NOTE — Progress Notes (Signed)
No syncope. No heart block. PAF with AF burden <1% Will discuss with the patient

## 2022-08-20 ENCOUNTER — Ambulatory Visit: Payer: Medicare HMO

## 2022-08-20 DIAGNOSIS — R0989 Other specified symptoms and signs involving the circulatory and respiratory systems: Secondary | ICD-10-CM | POA: Diagnosis not present

## 2022-08-20 DIAGNOSIS — I25118 Atherosclerotic heart disease of native coronary artery with other forms of angina pectoris: Secondary | ICD-10-CM

## 2022-08-20 DIAGNOSIS — I739 Peripheral vascular disease, unspecified: Secondary | ICD-10-CM

## 2022-08-20 DIAGNOSIS — R55 Syncope and collapse: Secondary | ICD-10-CM

## 2022-08-25 ENCOUNTER — Other Ambulatory Visit: Payer: Self-pay | Admitting: Cardiology

## 2022-08-25 DIAGNOSIS — R55 Syncope and collapse: Secondary | ICD-10-CM

## 2022-08-25 DIAGNOSIS — I739 Peripheral vascular disease, unspecified: Secondary | ICD-10-CM

## 2022-08-27 NOTE — Progress Notes (Signed)
Lower Extremity Arterial Duplex 08/20/2022: Right SFA is occluded with absent flow from the proximal to the distal segment.  Collaterals filling the distal segments with monophasic dampened waveforms. There is dampened monophasic waveform pattern noted in the left common femoral artery and left SFA suggesting proximal (iliac) artery stenosis of hemodynamic significance. This exam reveals mildly decreased perfusion of the right lower extremity, noted at the post tibial artery level (ABI 0.86) with severely abnormal monophasic waveform.   This exam reveals mildly decreased perfusion of the left lower extremity, noted at the dorsalis pedis artery level (ABI 0.81) with mildly abnormal biphasic waveform   Discuss on your visit soon

## 2022-08-28 ENCOUNTER — Other Ambulatory Visit: Payer: Medicare HMO

## 2022-09-05 ENCOUNTER — Ambulatory Visit: Payer: Medicare HMO | Admitting: Cardiology

## 2022-09-10 ENCOUNTER — Other Ambulatory Visit: Payer: Self-pay | Admitting: Internal Medicine

## 2022-09-10 ENCOUNTER — Other Ambulatory Visit: Payer: Self-pay

## 2022-09-10 DIAGNOSIS — E785 Hyperlipidemia, unspecified: Secondary | ICD-10-CM

## 2022-09-10 DIAGNOSIS — E039 Hypothyroidism, unspecified: Secondary | ICD-10-CM | POA: Diagnosis not present

## 2022-09-10 DIAGNOSIS — J9859 Other diseases of mediastinum, not elsewhere classified: Secondary | ICD-10-CM

## 2022-09-22 ENCOUNTER — Other Ambulatory Visit: Payer: Medicare HMO

## 2022-09-24 ENCOUNTER — Other Ambulatory Visit: Payer: Medicare HMO

## 2022-09-26 ENCOUNTER — Ambulatory Visit: Payer: Medicare HMO | Admitting: Cardiology

## 2022-09-27 ENCOUNTER — Ambulatory Visit: Payer: Medicare HMO

## 2022-09-27 DIAGNOSIS — R0989 Other specified symptoms and signs involving the circulatory and respiratory systems: Secondary | ICD-10-CM | POA: Diagnosis not present

## 2022-09-29 NOTE — Progress Notes (Signed)
Abdominal Aortic Duplex 09/27/2022: No AAA observed. The maximum aorta (sac) diameter is 2.68 cm (prox).  The right proximal common iliac artery waveform demonstrates a multiphasic flow pattern.  The left proximal common iliac artery waveform demonstrates a multiphasic flow pattern.

## 2022-10-04 ENCOUNTER — Other Ambulatory Visit: Payer: Self-pay | Admitting: Cardiology

## 2022-10-04 DIAGNOSIS — E7800 Pure hypercholesterolemia, unspecified: Secondary | ICD-10-CM

## 2022-10-08 ENCOUNTER — Encounter: Payer: Self-pay | Admitting: Cardiology

## 2022-10-08 ENCOUNTER — Ambulatory Visit: Payer: Medicare HMO | Admitting: Cardiology

## 2022-10-08 VITALS — BP 155/80 | HR 60 | Resp 16 | Ht 65.0 in | Wt 120.8 lb

## 2022-10-08 DIAGNOSIS — I10 Essential (primary) hypertension: Secondary | ICD-10-CM

## 2022-10-08 DIAGNOSIS — I739 Peripheral vascular disease, unspecified: Secondary | ICD-10-CM | POA: Diagnosis not present

## 2022-10-08 DIAGNOSIS — R55 Syncope and collapse: Secondary | ICD-10-CM

## 2022-10-08 DIAGNOSIS — E7800 Pure hypercholesterolemia, unspecified: Secondary | ICD-10-CM | POA: Diagnosis not present

## 2022-10-08 DIAGNOSIS — R931 Abnormal findings on diagnostic imaging of heart and coronary circulation: Secondary | ICD-10-CM | POA: Diagnosis not present

## 2022-10-08 MED ORDER — ASPIRIN 81 MG PO CHEW: 81.0000 mg | CHEWABLE_TABLET | Freq: Every day | ORAL | Status: DC

## 2022-10-08 MED ORDER — CLOPIDOGREL BISULFATE 75 MG PO TABS: 75.0000 mg | ORAL_TABLET | Freq: Every day | ORAL | 3 refills | Status: DC

## 2022-10-08 MED ORDER — LOSARTAN POTASSIUM 25 MG PO TABS: 25.0000 mg | ORAL_TABLET | Freq: Every evening | ORAL | 2 refills | Status: DC
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
## 2022-10-08 NOTE — Patient Instructions (Signed)
1.  Please go to [facility] any day after 6 hours of fasting to check your cholesterol and your kidney function.  2.  Since you have been passing out without unexplained reasons, and the suspicion that you are passing out spells could be related to the heart, I have recommended that he have a loop recorder implanted.  My schedule and my staff will dictate when it will be done probably over the next few days.  3.  Since you are having severe symptoms of leg cramps and weakness in your legs when you are doing routine activities, Dopplers suggest that you have blockages in the both legs, will schedule for peripheral arteriogram.

## 2022-10-22 ENCOUNTER — Ambulatory Visit: Payer: Medicare HMO | Admitting: Cardiology

## 2022-10-22 ENCOUNTER — Encounter: Payer: Self-pay | Admitting: Cardiology

## 2022-10-22 VITALS — BP 161/75 | HR 80 | Resp 16 | Ht 65.0 in | Wt 119.0 lb

## 2022-10-22 DIAGNOSIS — Z9889 Other specified postprocedural states: Secondary | ICD-10-CM | POA: Insufficient documentation

## 2022-10-22 DIAGNOSIS — R55 Syncope and collapse: Secondary | ICD-10-CM | POA: Insufficient documentation

## 2022-10-22 HISTORY — DX: Other specified postprocedural states: Z98.890

## 2022-10-22 HISTORY — DX: Syncope and collapse: R55

## 2022-10-22 NOTE — Progress Notes (Signed)
No chief complaint on file.    Brief History: Angel [name]  is a 72 y.o. extremely complex patient with longtime history of tobacco use disorder quit in 2018, still uses marijuana, referred to me for evaluation of syncope that occurred on 03/24/2022 and presented to the emergency room.  Poor historian.  [name], patient has no recollection. Evaluated by me on 10/08/2022, and after extensive evaluation, discussion, he does protect best to proceed with loop recorder implantation.   There were no vitals taken for this visit.   Physical Exam Constitutional:      Appearance: He is underweight.  Neck:     Vascular: No carotid bruit or JVD.  Cardiovascular:     Rate and Rhythm: Normal rate and regular rhythm.     Pulses:          Femoral pulses are 1+ on the right side and 0 on the left side.      Popliteal pulses are 0 on the right side and 0 on the left side.       Dorsalis pedis pulses are 0 on the right side and 0 on the left side.       Posterior tibial pulses are 0 on the right side and 0 on the left side.     Heart sounds: Normal heart sounds. No murmur heard.    No gallop.     Comments: Abdominal bruit Pulmonary:     Effort: Pulmonary effort is normal.     Breath sounds: Normal breath sounds.  Abdominal:     General: Bowel sounds are normal.     Palpations: Abdomen is soft.  Musculoskeletal:     Right lower leg: No edema.     Left lower leg: No edema.         ICD-10-CM   1. Syncope and collapse  R55      Prodedure performed: Insertion of Abbott Assert-IQ 3 Loop recorder 10/22/22 Serial # [id number]  Indication: Syncope After obtaining informed consent, explaining the procedure to the patient, under sterile precautions, local anesthesia with 1% lidocaine with epinephrine was injected subcutaneously in the left parasternal region.  20 mL utilized.  A small  nick was made in the left paraspinal region at the 3rd intercostal space. Abbott Assert-IQ 3 Loop recorder  was inserted without any complications.  Patient tolerated the procedure well.  The incision was closed with Steri-Strips.  There is no blood loss, no hematoma.  Written instrtuctions regarding wound care given to the patient.  Device setting:  R wave amplitude 0.64 mV.   Programmed:  AF detection to ON (30 min). HVR 160/min for 12 beats. Bradycardia 30 BPM Asystole 3 Sec Patient Trigger: ON          [name], MD, [facility] 10/22/2022, 3:02 PM Office: [phone] Fax: [phone] Pager: [phone]

## 2022-10-22 NOTE — Patient Instructions (Signed)
Remove the gauze only tomorrow.  There is a Tegaderm which is a transparent tape, leave that there for 3 days.  No bathing or shower for 3 days.  He can take the Tegaderm off after 3 days, leave the underlying Steri-Strips in place until follow-up by themselves.  Will come back for a wound check in 10 days.

## 2022-10-23 ENCOUNTER — Ambulatory Visit: Payer: Self-pay | Admitting: Cardiology

## 2022-10-23 DIAGNOSIS — Z4889 Encounter for other specified surgical aftercare: Secondary | ICD-10-CM

## 2022-10-23 NOTE — Progress Notes (Signed)
Patient noted to have no hematoma however wound has been oozing continuously, pressure dressing applied, patient made to wait for 45 minutes, no further dressing change needed, no further oozing, advised patient to leave pressure dressing on for the next 3 days.  He will continue to hold Plavix and aspirin for the next 3 days until bleeding is further stopped, but he will contact us if there is recurrence of bleeding.

## 2022-10-28 DIAGNOSIS — R931 Abnormal findings on diagnostic imaging of heart and coronary circulation: Secondary | ICD-10-CM | POA: Diagnosis not present

## 2022-10-28 DIAGNOSIS — I739 Peripheral vascular disease, unspecified: Secondary | ICD-10-CM | POA: Diagnosis not present

## 2022-10-28 DIAGNOSIS — E7800 Pure hypercholesterolemia, unspecified: Secondary | ICD-10-CM | POA: Diagnosis not present

## 2022-10-30 ENCOUNTER — Other Ambulatory Visit: Payer: Self-pay | Admitting: Cardiology

## 2022-10-30 DIAGNOSIS — E7800 Pure hypercholesterolemia, unspecified: Secondary | ICD-10-CM

## 2022-10-30 LAB — LIPID PANEL WITH LDL/HDL RATIO
Cholesterol, Total: 149 mg/dL (ref 100–199)
HDL: 58 mg/dL (ref >39)
LDL Chol Calc (NIH): 72 mg/dL (ref 0–99)
LDL/HDL Ratio: 1.2 ratio (ref 0.0–3.6)
Triglycerides: 102 mg/dL (ref 0–149)
VLDL Cholesterol Cal: 19 mg/dL (ref 5–40)

## 2022-10-30 LAB — CBC
Hematocrit: 33.5 % — ABNORMAL LOW (ref 37.5–51.0)
Hemoglobin: 11.3 g/dL — ABNORMAL LOW (ref 13.0–17.7)
MCH: 32.5 pg (ref 26.6–33.0)
MCHC: 33.7 g/dL (ref 31.5–35.7)
MCV: 96 fL (ref 79–97)
Platelets: 327 10*3/uL (ref 150–450)
RBC: 3.48 x10E6/uL — ABNORMAL LOW (ref 4.14–5.80)
RDW: 14 % (ref 11.6–15.4)
WBC: 6.1 10*3/uL (ref 3.4–10.8)

## 2022-10-30 LAB — BASIC METABOLIC PANEL
BUN/Creatinine Ratio: 4 — ABNORMAL LOW (ref 10–24)
BUN: 4 mg/dL — ABNORMAL LOW (ref 8–27)
CO2: 25 mmol/L (ref 20–29)
Calcium: 9.2 mg/dL (ref 8.6–10.2)
Chloride: 107 mmol/L — ABNORMAL HIGH (ref 96–106)
Creatinine, Ser: 1.08 mg/dL (ref 0.76–1.27)
Glucose: 110 mg/dL — ABNORMAL HIGH (ref 70–99)
Potassium: 4.3 mmol/L (ref 3.5–5.2)
Sodium: 143 mmol/L (ref 134–144)
eGFR: 74 mL/min/{1.73_m2} (ref >59)

## 2022-10-30 LAB — LIPOPROTEIN A (LPA): Lipoprotein (a): 10.9 nmol/L (ref <75.0)

## 2022-10-30 NOTE — Progress Notes (Signed)
Lipoprotein (a) <75.0 nmol/L 10/28/2022: 10 nmol/L.

## 2022-11-01 ENCOUNTER — Ambulatory Visit: Payer: Medicare HMO | Admitting: Cardiology

## 2022-11-01 DIAGNOSIS — Z4889 Encounter for other specified surgical aftercare: Secondary | ICD-10-CM

## 2022-11-01 NOTE — Progress Notes (Signed)
Loop recorder implant site has completely healed with no hematoma, no fluctuation.  Mild ecchymosis noted in the pectoral region.

## 2022-11-02 ENCOUNTER — Encounter: Payer: Self-pay | Admitting: Cardiology

## 2022-11-04 ENCOUNTER — Ambulatory Visit
Admission: RE | Admit: 2022-11-04 | Discharge: 2022-11-04 | Disposition: A | Discharge status: Home / Self Care | Payer: Medicare HMO | Source: Ambulatory Visit | Attending: Internal Medicine | Admitting: Internal Medicine

## 2022-11-04 DIAGNOSIS — M47814 Spondylosis without myelopathy or radiculopathy, thoracic region: Secondary | ICD-10-CM | POA: Diagnosis not present

## 2022-11-04 DIAGNOSIS — M5134 Other intervertebral disc degeneration, thoracic region: Secondary | ICD-10-CM | POA: Diagnosis not present

## 2022-11-04 DIAGNOSIS — E785 Hyperlipidemia, unspecified: Secondary | ICD-10-CM

## 2022-11-07 ENCOUNTER — Encounter: Payer: Self-pay | Admitting: Cardiology

## 2022-11-21 DIAGNOSIS — Z95818 Presence of other cardiac implants and grafts: Secondary | ICD-10-CM | POA: Diagnosis not present

## 2022-11-21 DIAGNOSIS — Z4509 Encounter for adjustment and management of other cardiac device: Secondary | ICD-10-CM | POA: Diagnosis not present

## 2022-11-21 DIAGNOSIS — R55 Syncope and collapse: Secondary | ICD-10-CM | POA: Diagnosis not present

## 2022-11-22 ENCOUNTER — Encounter (HOSPITAL_COMMUNITY): Admission: RE | Payer: Self-pay | Source: Ambulatory Visit

## 2022-11-22 ENCOUNTER — Ambulatory Visit (HOSPITAL_COMMUNITY): Admission: RE | Admit: 2022-11-22 | Payer: Medicare HMO | Source: Ambulatory Visit | Admitting: Cardiology

## 2022-11-22 SURGERY — ABDOMINAL AORTOGRAM W/LOWER EXTREMITY
Anesthesia: LOCAL

## 2022-11-29 ENCOUNTER — Ambulatory Visit (HOSPITAL_COMMUNITY)
Admission: AD | Admit: 2022-11-29 | Discharge: 2022-11-29 | Disposition: A | Discharge status: Home / Self Care | Payer: Medicare HMO | Source: Ambulatory Visit | Attending: Cardiology | Admitting: Cardiology

## 2022-11-29 ENCOUNTER — Other Ambulatory Visit: Payer: Self-pay | Admitting: Cardiology

## 2022-11-29 ENCOUNTER — Encounter (HOSPITAL_COMMUNITY)
Admission: AD | Disposition: A | Discharge status: Home / Self Care | Payer: Self-pay | Source: Ambulatory Visit | Attending: Cardiology

## 2022-11-29 ENCOUNTER — Other Ambulatory Visit: Payer: Self-pay

## 2022-11-29 DIAGNOSIS — Z87891 Personal history of nicotine dependence: Secondary | ICD-10-CM | POA: Diagnosis not present

## 2022-11-29 DIAGNOSIS — I739 Peripheral vascular disease, unspecified: Secondary | ICD-10-CM | POA: Diagnosis not present

## 2022-11-29 DIAGNOSIS — J449 Chronic obstructive pulmonary disease, unspecified: Secondary | ICD-10-CM | POA: Diagnosis not present

## 2022-11-29 DIAGNOSIS — I70212 Atherosclerosis of native arteries of extremities with intermittent claudication, left leg: Secondary | ICD-10-CM | POA: Diagnosis not present

## 2022-11-29 DIAGNOSIS — I70213 Atherosclerosis of native arteries of extremities with intermittent claudication, bilateral legs: Secondary | ICD-10-CM | POA: Insufficient documentation

## 2022-11-29 DIAGNOSIS — Z299 Encounter for prophylactic measures, unspecified: Secondary | ICD-10-CM

## 2022-11-29 DIAGNOSIS — R600 Localized edema: Secondary | ICD-10-CM

## 2022-11-29 DIAGNOSIS — I70201 Unspecified atherosclerosis of native arteries of extremities, right leg: Secondary | ICD-10-CM | POA: Diagnosis not present

## 2022-11-29 DIAGNOSIS — E7800 Pure hypercholesterolemia, unspecified: Secondary | ICD-10-CM | POA: Insufficient documentation

## 2022-11-29 HISTORY — PX: PERIPHERAL VASCULAR INTERVENTION: CATH118257

## 2022-11-29 HISTORY — PX: ABDOMINAL AORTOGRAM W/LOWER EXTREMITY: CATH118223

## 2022-11-29 HISTORY — PX: PERIPHERAL INTRAVASCULAR LITHOTRIPSY: CATH118324

## 2022-11-29 LAB — CBC
HCT: 33.4 % — ABNORMAL LOW (ref 39.0–52.0)
Hemoglobin: 10.7 g/dL — ABNORMAL LOW (ref 13.0–17.0)
MCH: 30.7 pg (ref 26.0–34.0)
MCHC: 32 g/dL (ref 30.0–36.0)
MCV: 95.7 fL (ref 80.0–100.0)
Platelets: 318 10*3/uL (ref 150–400)
RBC: 3.49 MIL/uL — ABNORMAL LOW (ref 4.22–5.81)
RDW: 14.6 % (ref 11.5–15.5)
WBC: 7.5 10*3/uL (ref 4.0–10.5)
nRBC: 0 % (ref 0.0–0.2)

## 2022-11-29 LAB — BASIC METABOLIC PANEL
Anion gap: 10 (ref 5–15)
BUN: <5 mg/dL — ABNORMAL LOW (ref 8–23)
CO2: 25 mmol/L (ref 22–32)
Calcium: 9 mg/dL (ref 8.9–10.3)
Chloride: 104 mmol/L (ref 98–111)
Creatinine, Ser: 1.04 mg/dL (ref 0.61–1.24)
GFR, Estimated: >60 mL/min (ref >60)
Glucose, Bld: 106 mg/dL — ABNORMAL HIGH (ref 70–99)
Potassium: 3.7 mmol/L (ref 3.5–5.1)
Sodium: 139 mmol/L (ref 135–145)

## 2022-11-29 SURGERY — ABDOMINAL AORTOGRAM W/LOWER EXTREMITY
Anesthesia: LOCAL

## 2022-11-29 MED ORDER — FENTANYL CITRATE (PF) 100 MCG/2ML IJ SOLN: 50.0000 ug | INTRAMUSCULAR | Status: DC | PRN

## 2022-11-29 MED ORDER — FENTANYL CITRATE (PF) 100 MCG/2ML IJ SOLN
INTRAMUSCULAR | Status: AC
  Filled 2022-11-29: qty 2

## 2022-11-29 MED ORDER — SODIUM CHLORIDE 0.9 % WEIGHT BASED INFUSION: 1.0000 mL/kg/h | INTRAVENOUS | Status: DC

## 2022-11-29 MED ORDER — SODIUM CHLORIDE 0.9% FLUSH: 3.0000 mL | Freq: Two times a day (BID) | INTRAVENOUS | Status: DC

## 2022-11-29 MED ORDER — FENTANYL CITRATE (PF) 100 MCG/2ML IJ SOLN
INTRAMUSCULAR | Status: DC | PRN
  Administered 2022-11-29 (×3): 25 ug via INTRAVENOUS
  Administered 2022-11-29: 50 ug via INTRAVENOUS

## 2022-11-29 MED ORDER — ONDANSETRON HCL 4 MG/2ML IJ SOLN: 4.0000 mg | Freq: Four times a day (QID) | INTRAMUSCULAR | Status: DC | PRN

## 2022-11-29 MED ORDER — HYDRALAZINE HCL 20 MG/ML IJ SOLN
INTRAMUSCULAR | Status: AC
  Filled 2022-11-29: qty 1

## 2022-11-29 MED ORDER — SODIUM CHLORIDE 0.9 % WEIGHT BASED INFUSION
3.0000 mL/kg/h | INTRAVENOUS | Status: AC
  Administered 2022-11-29: 3 mL/kg/h via INTRAVENOUS

## 2022-11-29 MED ORDER — SODIUM CHLORIDE 0.9 % IV SOLN: 250.0000 mL | INTRAVENOUS | Status: DC | PRN

## 2022-11-29 MED ORDER — MIDAZOLAM HCL 2 MG/2ML IJ SOLN
INTRAMUSCULAR | Status: AC
  Filled 2022-11-29: qty 2

## 2022-11-29 MED ORDER — HYDRALAZINE HCL 20 MG/ML IJ SOLN: 10.0000 mg | INTRAMUSCULAR | Status: DC | PRN

## 2022-11-29 MED ORDER — ASPIRIN 81 MG PO CHEW
81.0000 mg | CHEWABLE_TABLET | ORAL | Status: AC
  Administered 2022-11-29: 81 mg via ORAL
  Filled 2022-11-29: qty 1

## 2022-11-29 MED ORDER — HEPARIN (PORCINE) IN NACL 1000-0.9 UT/500ML-% IV SOLN
INTRAVENOUS | Status: DC | PRN
  Administered 2022-11-29: 1000 mL

## 2022-11-29 MED ORDER — ASPIRIN 81 MG PO CHEW: 81.0000 mg | CHEWABLE_TABLET | Freq: Every day | ORAL | Status: DC

## 2022-11-29 MED ORDER — CLOPIDOGREL BISULFATE 75 MG PO TABS
75.0000 mg | ORAL_TABLET | ORAL | Status: AC
  Administered 2022-11-29: 75 mg via ORAL
  Filled 2022-11-29: qty 1

## 2022-11-29 MED ORDER — IODIXANOL 320 MG/ML IV SOLN
INTRAVENOUS | Status: DC | PRN
  Administered 2022-11-29: 180 mL via INTRA_ARTERIAL

## 2022-11-29 MED ORDER — HEPARIN SODIUM (PORCINE) 1000 UNIT/ML IJ SOLN
INTRAMUSCULAR | Status: DC | PRN
  Administered 2022-11-29: 5000 [IU] via INTRAVENOUS

## 2022-11-29 MED ORDER — SODIUM CHLORIDE 0.9% FLUSH: 3.0000 mL | INTRAVENOUS | Status: DC | PRN

## 2022-11-29 MED ORDER — ACETAMINOPHEN 325 MG PO TABS
650.0000 mg | ORAL_TABLET | ORAL | Status: DC | PRN
  Administered 2022-11-29: 650 mg via ORAL
  Filled 2022-11-29: qty 2

## 2022-11-29 MED ORDER — LIDOCAINE HCL (PF) 1 % IJ SOLN
INTRAMUSCULAR | Status: DC | PRN
  Administered 2022-11-29: 15 mL via INTRADERMAL

## 2022-11-29 MED ORDER — LIDOCAINE HCL (PF) 1 % IJ SOLN
INTRAMUSCULAR | Status: AC
  Filled 2022-11-29: qty 30

## 2022-11-29 MED ORDER — MIDAZOLAM HCL 2 MG/2ML IJ SOLN
INTRAMUSCULAR | Status: DC | PRN
  Administered 2022-11-29: 2 mg via INTRAVENOUS

## 2022-11-29 MED ORDER — HYDRALAZINE HCL 20 MG/ML IJ SOLN
INTRAMUSCULAR | Status: DC | PRN
  Administered 2022-11-29 (×3): 10 mg via INTRAVENOUS

## 2022-11-29 SURGICAL SUPPLY — 23 items
BALLN EMERGE MR 3.0X20 (BALLOONS) ×2
BALLN MUSTANG 5.0X20 75 (BALLOONS) ×2
BALLOON EMERGE MR 3.0X20 (BALLOONS) IMPLANT
BALLOON MUSTANG 5.0X20 75 (BALLOONS) IMPLANT
CATH ANGIO 5F BER2 65CM (CATHETERS) IMPLANT
CATH OMNI FLUSH 5F 65CM (CATHETERS) IMPLANT
CATH SHOCKWAVE M5 8.0X60 (CATHETERS) IMPLANT
CLOSURE PERCLOSE PROSTYLE (VASCULAR PRODUCTS) IMPLANT
KIT ENCORE 26 ADVANTAGE (KITS) IMPLANT
KIT MICROPUNCTURE NIT STIFF (SHEATH) IMPLANT
KIT PV (KITS) ×2 IMPLANT
SHEATH BRITE TIP 7FR 35CM (SHEATH) IMPLANT
SHEATH PINNACLE 5F 10CM (SHEATH) IMPLANT
SHEATH PROBE COVER 6X72 (BAG) IMPLANT
STENT VIABAHN 39XCATH 80 (Permanent Stent) IMPLANT
STENT VIABAHN 8X39 7FR 80 (Permanent Stent) ×2 IMPLANT
STOPCOCK MORSE 400PSI 3WAY (MISCELLANEOUS) IMPLANT
SYR MEDRAD MARK 7 150ML (SYRINGE) ×2 IMPLANT
TRANSDUCER W/STOPCOCK (MISCELLANEOUS) ×2 IMPLANT
TRAY PV CATH (CUSTOM PROCEDURE TRAY) ×2 IMPLANT
TUBING CIL FLEX 10 FLL-RA (TUBING) IMPLANT
WIRE BENTSON .035X145CM (WIRE) IMPLANT
WIRE RUNTHROUGH .014X300CM (WIRE) IMPLANT
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
## 2022-11-29 NOTE — Interval H&P Note (Signed)
History and Physical Interval Note:  11/29/2022 8:02 AM  Angel [name]  has presented today for surgery, with the diagnosis of peripheral vascular disease.  The various methods of treatment have been discussed with the patient and family. After consideration of risks, benefits and other options for treatment, the patient has consented to  Procedure(s): ABDOMINAL AORTOGRAM W/LOWER EXTREMITY (N/A) and possible intervention as a surgical intervention.  The patient's history has been reviewed, patient examined, no change in status, stable for surgery.  I have reviewed the patient's chart and labs.  Questions were answered to the patient's satisfaction.     [name]
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
## 2022-12-02 ENCOUNTER — Encounter (HOSPITAL_COMMUNITY): Payer: Self-pay | Admitting: Cardiology

## 2022-12-09 ENCOUNTER — Other Ambulatory Visit: Payer: Medicare HMO

## 2022-12-11 ENCOUNTER — Ambulatory Visit: Payer: Medicare HMO

## 2022-12-11 DIAGNOSIS — I739 Peripheral vascular disease, unspecified: Secondary | ICD-10-CM

## 2022-12-16 ENCOUNTER — Ambulatory Visit: Payer: Medicare HMO | Admitting: Cardiology

## 2022-12-17 ENCOUNTER — Ambulatory Visit: Payer: Medicare HMO | Admitting: Cardiology

## 2022-12-17 ENCOUNTER — Encounter: Payer: Self-pay | Admitting: Cardiology

## 2022-12-17 VITALS — BP 120/83 | HR 89 | Resp 16 | Ht 65.0 in | Wt 133.0 lb

## 2022-12-17 DIAGNOSIS — R55 Syncope and collapse: Secondary | ICD-10-CM | POA: Diagnosis not present

## 2022-12-17 DIAGNOSIS — Z9889 Other specified postprocedural states: Secondary | ICD-10-CM | POA: Diagnosis not present

## 2022-12-17 DIAGNOSIS — I739 Peripheral vascular disease, unspecified: Secondary | ICD-10-CM | POA: Diagnosis not present

## 2022-12-17 DIAGNOSIS — I10 Essential (primary) hypertension: Secondary | ICD-10-CM | POA: Diagnosis not present
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
## 2022-12-22 DIAGNOSIS — Z95818 Presence of other cardiac implants and grafts: Secondary | ICD-10-CM | POA: Diagnosis not present

## 2022-12-22 DIAGNOSIS — R55 Syncope and collapse: Secondary | ICD-10-CM | POA: Diagnosis not present

## 2022-12-22 DIAGNOSIS — Z4509 Encounter for adjustment and management of other cardiac device: Secondary | ICD-10-CM | POA: Diagnosis not present

## 2022-12-24 DIAGNOSIS — J9859 Other diseases of mediastinum, not elsewhere classified: Secondary | ICD-10-CM | POA: Diagnosis not present

## 2022-12-24 DIAGNOSIS — G894 Chronic pain syndrome: Secondary | ICD-10-CM | POA: Diagnosis not present

## 2022-12-24 DIAGNOSIS — E785 Hyperlipidemia, unspecified: Secondary | ICD-10-CM | POA: Diagnosis not present

## 2022-12-24 DIAGNOSIS — R931 Abnormal findings on diagnostic imaging of heart and coronary circulation: Secondary | ICD-10-CM | POA: Diagnosis not present

## 2022-12-31 ENCOUNTER — Other Ambulatory Visit: Payer: Self-pay

## 2022-12-31 DIAGNOSIS — J9859 Other diseases of mediastinum, not elsewhere classified: Secondary | ICD-10-CM

## 2023-01-04 ENCOUNTER — Other Ambulatory Visit: Payer: Self-pay | Admitting: Cardiology

## 2023-01-04 DIAGNOSIS — I10 Essential (primary) hypertension: Secondary | ICD-10-CM

## 2023-01-04 DIAGNOSIS — I739 Peripheral vascular disease, unspecified: Secondary | ICD-10-CM

## 2023-01-22 DIAGNOSIS — R55 Syncope and collapse: Secondary | ICD-10-CM | POA: Diagnosis not present

## 2023-01-22 DIAGNOSIS — Z4509 Encounter for adjustment and management of other cardiac device: Secondary | ICD-10-CM | POA: Diagnosis not present

## 2023-01-22 DIAGNOSIS — Z95818 Presence of other cardiac implants and grafts: Secondary | ICD-10-CM | POA: Diagnosis not present

## 2023-01-23 ENCOUNTER — Ambulatory Visit
Admission: RE | Admit: 2023-01-23 | Discharge: 2023-01-23 | Disposition: A | Discharge status: Home / Self Care | Payer: Medicare HMO | Source: Ambulatory Visit

## 2023-01-23 DIAGNOSIS — J9859 Other diseases of mediastinum, not elsewhere classified: Secondary | ICD-10-CM

## 2023-01-23 DIAGNOSIS — J439 Emphysema, unspecified: Secondary | ICD-10-CM | POA: Diagnosis not present

## 2023-01-23 DIAGNOSIS — I2510 Atherosclerotic heart disease of native coronary artery without angina pectoris: Secondary | ICD-10-CM | POA: Diagnosis not present

## 2023-01-23 MED ORDER — IOPAMIDOL (ISOVUE-300) INJECTION 61%
500.0000 mL | Freq: Once | INTRAVENOUS | Status: AC | PRN
  Administered 2023-01-23: 75 mL via INTRAVENOUS

## 2023-01-31 ENCOUNTER — Other Ambulatory Visit: Payer: Self-pay | Admitting: Cardiology

## 2023-01-31 DIAGNOSIS — I10 Essential (primary) hypertension: Secondary | ICD-10-CM

## 2023-01-31 DIAGNOSIS — I739 Peripheral vascular disease, unspecified: Secondary | ICD-10-CM

## 2023-02-18 ENCOUNTER — Encounter: Payer: Self-pay | Admitting: Thoracic Surgery (Cardiothoracic Vascular Surgery)

## 2023-02-18 ENCOUNTER — Institutional Professional Consult (permissible substitution): Payer: Medicare HMO | Admitting: Thoracic Surgery (Cardiothoracic Vascular Surgery)

## 2023-02-18 VITALS — BP 100/60 | HR 76 | Resp 18 | Ht 65.0 in | Wt 127.0 lb

## 2023-02-18 DIAGNOSIS — J9859 Other diseases of mediastinum, not elsewhere classified: Secondary | ICD-10-CM | POA: Diagnosis not present
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
## 2023-02-19 ENCOUNTER — Encounter: Payer: Self-pay | Admitting: *Deleted

## 2023-02-19 ENCOUNTER — Other Ambulatory Visit: Payer: Self-pay | Admitting: *Deleted

## 2023-02-19 DIAGNOSIS — J9859 Other diseases of mediastinum, not elsewhere classified: Secondary | ICD-10-CM

## 2023-02-26 NOTE — Pre-Procedure Instructions (Signed)
Surgical Instructions   Your procedure is scheduled on Monday, November 4th. Report to [facility] Main Entrance "A" at 05:30 A.M., then check in with the Admitting office. Any questions or running late day of surgery: call [phone]  Questions prior to your surgery date: call [phone], Monday-Friday, 8am-4pm. If you experience any cold or flu symptoms such as cough, fever, chills, shortness of breath, etc. between now and your scheduled surgery, please notify us at the above number.     Remember:  Do not eat or drink after midnight the night before your surgery     Take these medicines the morning of surgery with A SIP OF WATER  escitalopram (LEXAPRO)  levothyroxine (SYNTHROID)  methocarbamol (ROBAXIN)  rosuvastatin (CRESTOR)   May take these medicines IF NEEDED: traMADol (ULTRAM)    Stop taking clopidogrel (PLAVIX) 5 days prior to surgery. Last dose 10/29.  One week prior to surgery, STOP taking any Aleve, Naproxen, Ibuprofen, Motrin, Advil, Goody's, BC's, all herbal medications, fish oil, and non-prescription vitamins.                     Do NOT Smoke (Tobacco/Vaping) for 24 hours prior to your procedure.  If you use a CPAP at night, you may bring your mask/headgear for your overnight stay.   You will be asked to remove any contacts, glasses, piercing's, hearing aid's, dentures/partials prior to surgery. Please bring cases for these items if needed.    Patients discharged the day of surgery will not be allowed to drive home, and someone needs to stay with them for 24 hours.  SURGICAL WAITING ROOM VISITATION Patients may have no more than 2 support people in the waiting area - these visitors may rotate.   Pre-op nurse will coordinate an appropriate time for 1 ADULT support person, who may not rotate, to accompany patient in pre-op.  Children under the age of 28 must have an adult with them who is not the patient and must remain in the main waiting area with an  adult.  If the patient needs to stay at the hospital during part of their recovery, the visitor guidelines for inpatient rooms apply.  Please refer to the [facility] website for the visitor guidelines for any additional information.   If you received a COVID test during your pre-op visit  it is requested that you wear a mask when out in public, stay away from anyone that may not be feeling well and notify your surgeon if you develop symptoms. If you have been in contact with anyone that has tested positive in the last 10 days please notify you surgeon.      Pre-operative CHG Bathing Instructions   You can play a key role in reducing the risk of infection after surgery. Your skin needs to be as free of germs as possible. You can reduce the number of germs on your skin by washing with CHG (chlorhexidine gluconate) soap before surgery. CHG is an antiseptic soap that kills germs and continues to kill germs even after washing.   DO NOT use if you have an allergy to chlorhexidine/CHG or antibacterial soaps. If your skin becomes reddened or irritated, stop using the CHG and notify one of our RNs at [phone].              TAKE A SHOWER THE NIGHT BEFORE SURGERY AND THE DAY OF SURGERY    Please keep in mind the following:  DO NOT shave, including legs and underarms,  48 hours prior to surgery.   You may shave your face before/day of surgery.  Place clean sheets on your bed the night before surgery Use a clean washcloth (not used since being washed) for each shower. DO NOT sleep with pet's night before surgery.  CHG Shower Instructions:  Wash your face and private area with normal soap. If you choose to wash your hair, wash first with your normal shampoo.  After you use shampoo/soap, rinse your hair and body thoroughly to remove shampoo/soap residue.  Turn the water OFF and apply half the bottle of CHG soap to a CLEAN washcloth.  Apply CHG soap ONLY FROM YOUR NECK DOWN TO YOUR TOES (washing  for 3-5 minutes)  DO NOT use CHG soap on face, private areas, open wounds, or sores.  Pay special attention to the area where your surgery is being performed.  If you are having back surgery, having someone wash your back for you may be helpful. Wait 2 minutes after CHG soap is applied, then you may rinse off the CHG soap.  Pat dry with a clean towel  Put on clean pajamas    Additional instructions for the day of surgery: DO NOT APPLY any lotions, deodorants, cologne, or perfumes.   Do not wear jewelry or makeup Do not wear nail polish, gel polish, artificial nails, or any other type of covering on natural nails (fingers and toes) Do not bring valuables to the hospital. [facility] is not responsible for valuables/personal belongings. Put on clean/comfortable clothes.  Please brush your teeth.  Ask your nurse before applying any prescription medications to the skin.

## 2023-02-27 ENCOUNTER — Ambulatory Visit (HOSPITAL_COMMUNITY)
Admission: RE | Admit: 2023-02-27 | Discharge: 2023-02-27 | Disposition: A | Discharge status: Home / Self Care | Payer: Medicare HMO | Source: Ambulatory Visit | Attending: Thoracic Surgery (Cardiothoracic Vascular Surgery) | Admitting: Thoracic Surgery (Cardiothoracic Vascular Surgery)

## 2023-02-27 ENCOUNTER — Other Ambulatory Visit: Payer: Self-pay

## 2023-02-27 ENCOUNTER — Encounter (HOSPITAL_COMMUNITY): Payer: Self-pay

## 2023-02-27 ENCOUNTER — Encounter (HOSPITAL_COMMUNITY)
Admission: RE | Admit: 2023-02-27 | Discharge: 2023-02-27 | Disposition: A | Discharge status: Home / Self Care | Payer: Medicare HMO | Source: Ambulatory Visit | Attending: Thoracic Surgery (Cardiothoracic Vascular Surgery) | Admitting: Thoracic Surgery (Cardiothoracic Vascular Surgery)

## 2023-02-27 VITALS — BP 137/71 | HR 76 | Temp 98.5°F | Resp 18 | Ht 65.0 in | Wt 126.9 lb

## 2023-02-27 DIAGNOSIS — Z01818 Encounter for other preprocedural examination: Secondary | ICD-10-CM | POA: Diagnosis not present

## 2023-02-27 DIAGNOSIS — J9859 Other diseases of mediastinum, not elsewhere classified: Secondary | ICD-10-CM | POA: Insufficient documentation

## 2023-02-27 DIAGNOSIS — Z01812 Encounter for preprocedural laboratory examination: Secondary | ICD-10-CM | POA: Insufficient documentation

## 2023-02-27 DIAGNOSIS — Z1152 Encounter for screening for COVID-19: Secondary | ICD-10-CM | POA: Insufficient documentation

## 2023-02-27 DIAGNOSIS — D649 Anemia, unspecified: Secondary | ICD-10-CM

## 2023-02-27 HISTORY — DX: Hypothyroidism, unspecified: E03.9

## 2023-02-27 HISTORY — DX: Essential (primary) hypertension: I10

## 2023-02-27 LAB — CBC
HCT: 27.3 % — ABNORMAL LOW (ref 39.0–52.0)
Hemoglobin: 7.9 g/dL — ABNORMAL LOW (ref 13.0–17.0)
MCH: 23.3 pg — ABNORMAL LOW (ref 26.0–34.0)
MCHC: 28.9 g/dL — ABNORMAL LOW (ref 30.0–36.0)
MCV: 80.5 fL (ref 80.0–100.0)
Platelets: 434 10*3/uL — ABNORMAL HIGH (ref 150–400)
RBC: 3.39 MIL/uL — ABNORMAL LOW (ref 4.22–5.81)
RDW: 15.9 % — ABNORMAL HIGH (ref 11.5–15.5)
WBC: 7.8 10*3/uL (ref 4.0–10.5)
nRBC: 0 % (ref 0.0–0.2)

## 2023-02-27 LAB — COMPREHENSIVE METABOLIC PANEL
ALT: 22 U/L (ref 0–44)
AST: 38 U/L (ref 15–41)
Albumin: 4.1 g/dL (ref 3.5–5.0)
Alkaline Phosphatase: 71 U/L (ref 38–126)
Anion gap: 10 (ref 5–15)
BUN: 6 mg/dL — ABNORMAL LOW (ref 8–23)
CO2: 26 mmol/L (ref 22–32)
Calcium: 9.8 mg/dL (ref 8.9–10.3)
Chloride: 105 mmol/L (ref 98–111)
Creatinine, Ser: 1.07 mg/dL (ref 0.61–1.24)
GFR, Estimated: >60 mL/min (ref >60)
Glucose, Bld: 111 mg/dL — ABNORMAL HIGH (ref 70–99)
Potassium: 4.7 mmol/L (ref 3.5–5.1)
Sodium: 141 mmol/L (ref 135–145)
Total Bilirubin: 0.4 mg/dL (ref 0.3–1.2)
Total Protein: 6.9 g/dL (ref 6.5–8.1)

## 2023-02-27 LAB — URINALYSIS, ROUTINE W REFLEX MICROSCOPIC
Bilirubin Urine: NEGATIVE
Glucose, UA: NEGATIVE mg/dL
Hgb urine dipstick: NEGATIVE
Ketones, ur: NEGATIVE mg/dL
Leukocytes,Ua: NEGATIVE
Nitrite: NEGATIVE
Protein, ur: NEGATIVE mg/dL
Specific Gravity, Urine: 1.013 (ref 1.005–1.030)
pH: 7 (ref 5.0–8.0)

## 2023-02-27 LAB — SARS CORONAVIRUS 2 (TAT 6-24 HRS): SARS Coronavirus 2: NEGATIVE

## 2023-02-27 LAB — SURGICAL PCR SCREEN
MRSA, PCR: NEGATIVE
Staphylococcus aureus: NEGATIVE

## 2023-02-27 LAB — PROTIME-INR
INR: 1 (ref 0.8–1.2)
Prothrombin Time: 13 s (ref 11.4–15.2)

## 2023-02-27 LAB — APTT: aPTT: 27 s (ref 24–36)

## 2023-02-27 NOTE — Progress Notes (Signed)
PCP - Laurin Coder  Cardiologist - Dr Jacinto Halim  EP-no  Endocrine- no  Pulm-no  Chest x-ray - 02/27/23  EKG - 02/27/23  Stress Test - 08/05/22  ECHO - 08/20/22  Cardiac Cath - no   LOOP-Yes  Nerve Stimulator-  Dialysis-no  Sleep Study - no CPAP - no  LABS-CBC, CMP, PT, PTT, CBC ABG to be drawn on DOS ASA-last dose 02/25/23  ERAS-no  HA1C-na GLP-1-no Fasting Blood Sugar - na  Anesthesia- Hbg 7.9 Pt denies having chest pain, sob, or fever at this time. All instructions explained to the pt, with a verbal understanding of the material. Pt agrees to go over the instructions while at home for a better understanding. The opportunity to ask questions was provided.

## 2023-02-28 NOTE — Anesthesia Preprocedure Evaluation (Signed)
Anesthesia Evaluation  Patient identified by MRN, date of birth, ID band Patient awake    Reviewed: Allergy & Precautions, NPO status , Patient's Chart, lab work & pertinent test results  Airway Mallampati: II  TM Distance: >3 FB Neck ROM: Full    Dental  (+) Edentulous Upper, Edentulous Lower   Pulmonary former smoker   breath sounds clear to auscultation       Cardiovascular hypertension, Pt. on medications + Peripheral Vascular Disease   Rhythm:Regular Rate:Normal     Neuro/Psych negative neurological ROS     GI/Hepatic negative GI ROS, Neg liver ROS,,,  Endo/Other  Hypothyroidism    Renal/GU negative Renal ROS     Musculoskeletal   Abdominal   Peds  Hematology  (+) Blood dyscrasia, anemia   Anesthesia Other Findings   Reproductive/Obstetrics                             Anesthesia Physical Anesthesia Plan  ASA: 4  Anesthesia Plan: General   Post-op Pain Management: Tylenol PO (pre-op)*   Induction: Intravenous  PONV Risk Score and Plan: 2 and Dexamethasone, Ondansetron and Treatment may vary due to age or medical condition  Airway Management Planned: Double Lumen EBT  Additional Equipment: Arterial line, CVP and Ultrasound Guidance Line Placement  Intra-op Plan:   Post-operative Plan: Extubation in OR and Possible Post-op intubation/ventilation  Informed Consent: I have reviewed the patients History and Physical, chart, labs and discussed the procedure including the risks, benefits and alternatives for the proposed anesthesia with the patient or authorized representative who has indicated his/her understanding and acceptance.     Dental advisory given  Plan Discussed with: CRNA  Anesthesia Plan Comments: (See PAT note written 02/28/2023 by Shonna Chock, PA-C. HGB 7.9 on 02/27/23. Repeat CBC ordered for day of surgery. T&S already done. If Prepare PRBC desired then  will need to be ordered. )       Anesthesia Quick Evaluation
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
## 2023-03-01 ENCOUNTER — Other Ambulatory Visit: Payer: Self-pay | Admitting: Cardiology

## 2023-03-01 DIAGNOSIS — E7800 Pure hypercholesterolemia, unspecified: Secondary | ICD-10-CM

## 2023-03-03 ENCOUNTER — Other Ambulatory Visit: Payer: Self-pay

## 2023-03-03 ENCOUNTER — Inpatient Hospital Stay (HOSPITAL_COMMUNITY): Payer: Medicare HMO

## 2023-03-03 ENCOUNTER — Inpatient Hospital Stay (HOSPITAL_COMMUNITY)
Admission: RE | Admit: 2023-03-03 | Discharge: 2023-03-07 | DRG: 827 | Disposition: A | Discharge status: Home / Self Care | Payer: Medicare HMO | Attending: Thoracic Surgery (Cardiothoracic Vascular Surgery) | Admitting: Thoracic Surgery (Cardiothoracic Vascular Surgery)

## 2023-03-03 ENCOUNTER — Inpatient Hospital Stay (HOSPITAL_COMMUNITY): Payer: Self-pay | Admitting: Vascular Surgery

## 2023-03-03 ENCOUNTER — Encounter (HOSPITAL_COMMUNITY)
Admission: RE | Disposition: A | Discharge status: Home / Self Care | Payer: Self-pay | Source: Home / Self Care | Attending: Thoracic Surgery (Cardiothoracic Vascular Surgery)

## 2023-03-03 ENCOUNTER — Encounter (HOSPITAL_COMMUNITY): Payer: Self-pay | Admitting: Thoracic Surgery (Cardiothoracic Vascular Surgery)

## 2023-03-03 DIAGNOSIS — C771 Secondary and unspecified malignant neoplasm of intrathoracic lymph nodes: Secondary | ICD-10-CM | POA: Diagnosis not present

## 2023-03-03 DIAGNOSIS — J95811 Postprocedural pneumothorax: Secondary | ICD-10-CM | POA: Diagnosis not present

## 2023-03-03 DIAGNOSIS — S2232XA Fracture of one rib, left side, initial encounter for closed fracture: Secondary | ICD-10-CM | POA: Diagnosis not present

## 2023-03-03 DIAGNOSIS — J9811 Atelectasis: Secondary | ICD-10-CM | POA: Diagnosis not present

## 2023-03-03 DIAGNOSIS — E039 Hypothyroidism, unspecified: Secondary | ICD-10-CM | POA: Diagnosis not present

## 2023-03-03 DIAGNOSIS — Z85810 Personal history of malignant neoplasm of tongue: Secondary | ICD-10-CM | POA: Diagnosis not present

## 2023-03-03 DIAGNOSIS — Z79890 Hormone replacement therapy (postmenopausal): Secondary | ICD-10-CM

## 2023-03-03 DIAGNOSIS — I739 Peripheral vascular disease, unspecified: Secondary | ICD-10-CM | POA: Diagnosis not present

## 2023-03-03 DIAGNOSIS — D649 Anemia, unspecified: Secondary | ICD-10-CM

## 2023-03-03 DIAGNOSIS — Z87891 Personal history of nicotine dependence: Secondary | ICD-10-CM

## 2023-03-03 DIAGNOSIS — Z95820 Peripheral vascular angioplasty status with implants and grafts: Secondary | ICD-10-CM | POA: Diagnosis not present

## 2023-03-03 DIAGNOSIS — K5900 Constipation, unspecified: Secondary | ICD-10-CM | POA: Diagnosis not present

## 2023-03-03 DIAGNOSIS — C37 Malignant neoplasm of thymus: Principal | ICD-10-CM | POA: Diagnosis present

## 2023-03-03 DIAGNOSIS — J984 Other disorders of lung: Secondary | ICD-10-CM | POA: Diagnosis present

## 2023-03-03 DIAGNOSIS — C7989 Secondary malignant neoplasm of other specified sites: Secondary | ICD-10-CM | POA: Diagnosis not present

## 2023-03-03 DIAGNOSIS — Z8249 Family history of ischemic heart disease and other diseases of the circulatory system: Secondary | ICD-10-CM | POA: Diagnosis not present

## 2023-03-03 DIAGNOSIS — J939 Pneumothorax, unspecified: Secondary | ICD-10-CM | POA: Diagnosis not present

## 2023-03-03 DIAGNOSIS — R9389 Abnormal findings on diagnostic imaging of other specified body structures: Secondary | ICD-10-CM | POA: Diagnosis not present

## 2023-03-03 DIAGNOSIS — R918 Other nonspecific abnormal finding of lung field: Secondary | ICD-10-CM | POA: Diagnosis not present

## 2023-03-03 DIAGNOSIS — Z452 Encounter for adjustment and management of vascular access device: Secondary | ICD-10-CM | POA: Diagnosis not present

## 2023-03-03 DIAGNOSIS — Z7982 Long term (current) use of aspirin: Secondary | ICD-10-CM | POA: Diagnosis not present

## 2023-03-03 DIAGNOSIS — I480 Paroxysmal atrial fibrillation: Secondary | ICD-10-CM | POA: Diagnosis not present

## 2023-03-03 DIAGNOSIS — Z9889 Other specified postprocedural states: Secondary | ICD-10-CM | POA: Diagnosis not present

## 2023-03-03 DIAGNOSIS — Z923 Personal history of irradiation: Secondary | ICD-10-CM

## 2023-03-03 DIAGNOSIS — Z7902 Long term (current) use of antithrombotics/antiplatelets: Secondary | ICD-10-CM | POA: Diagnosis not present

## 2023-03-03 DIAGNOSIS — Z79899 Other long term (current) drug therapy: Secondary | ICD-10-CM

## 2023-03-03 DIAGNOSIS — R222 Localized swelling, mass and lump, trunk: Secondary | ICD-10-CM | POA: Diagnosis present

## 2023-03-03 DIAGNOSIS — Z4682 Encounter for fitting and adjustment of non-vascular catheter: Secondary | ICD-10-CM | POA: Diagnosis not present

## 2023-03-03 DIAGNOSIS — I10 Essential (primary) hypertension: Secondary | ICD-10-CM | POA: Diagnosis not present

## 2023-03-03 DIAGNOSIS — J90 Pleural effusion, not elsewhere classified: Secondary | ICD-10-CM | POA: Diagnosis not present

## 2023-03-03 DIAGNOSIS — D630 Anemia in neoplastic disease: Secondary | ICD-10-CM | POA: Diagnosis present

## 2023-03-03 DIAGNOSIS — Z01818 Encounter for other preprocedural examination: Secondary | ICD-10-CM

## 2023-03-03 DIAGNOSIS — J439 Emphysema, unspecified: Secondary | ICD-10-CM | POA: Diagnosis not present

## 2023-03-03 DIAGNOSIS — J9859 Other diseases of mediastinum, not elsewhere classified: Principal | ICD-10-CM | POA: Diagnosis present

## 2023-03-03 DIAGNOSIS — S2242XA Multiple fractures of ribs, left side, initial encounter for closed fracture: Secondary | ICD-10-CM | POA: Diagnosis not present

## 2023-03-03 DIAGNOSIS — J986 Disorders of diaphragm: Secondary | ICD-10-CM | POA: Diagnosis not present

## 2023-03-03 DIAGNOSIS — Z23 Encounter for immunization: Secondary | ICD-10-CM | POA: Diagnosis not present

## 2023-03-03 DIAGNOSIS — T797XXA Traumatic subcutaneous emphysema, initial encounter: Secondary | ICD-10-CM | POA: Diagnosis not present

## 2023-03-03 LAB — CBC
HCT: 26.2 % — ABNORMAL LOW (ref 39.0–52.0)
Hemoglobin: 7.6 g/dL — ABNORMAL LOW (ref 13.0–17.0)
MCH: 23 pg — ABNORMAL LOW (ref 26.0–34.0)
MCHC: 29 g/dL — ABNORMAL LOW (ref 30.0–36.0)
MCV: 79.2 fL — ABNORMAL LOW (ref 80.0–100.0)
Platelets: 406 10*3/uL — ABNORMAL HIGH (ref 150–400)
RBC: 3.31 MIL/uL — ABNORMAL LOW (ref 4.22–5.81)
RDW: 16 % — ABNORMAL HIGH (ref 11.5–15.5)
WBC: 5.6 10*3/uL (ref 4.0–10.5)
nRBC: 0 % (ref 0.0–0.2)

## 2023-03-03 LAB — BLOOD GAS, ARTERIAL
Acid-Base Excess: 2.3 mmol/L — ABNORMAL HIGH (ref 0.0–2.0)
Bicarbonate: 26.3 mmol/L (ref 20.0–28.0)
O2 Saturation: 99.4 %
Patient temperature: 37
pCO2 arterial: 37 mm[Hg] (ref 32–48)
pH, Arterial: 7.46 — ABNORMAL HIGH (ref 7.35–7.45)
pO2, Arterial: 102 mm[Hg] (ref 83–108)

## 2023-03-03 LAB — POCT I-STAT 7, (LYTES, BLD GAS, ICA,H+H)
Acid-base deficit: 4 mmol/L — ABNORMAL HIGH (ref 0.0–2.0)
Bicarbonate: 23.4 mmol/L (ref 20.0–28.0)
Calcium, Ion: 1.19 mmol/L (ref 1.15–1.40)
HCT: 28 % — ABNORMAL LOW (ref 39.0–52.0)
Hemoglobin: 9.5 g/dL — ABNORMAL LOW (ref 13.0–17.0)
O2 Saturation: 97 %
Patient temperature: 35.2
Potassium: 3.9 mmol/L (ref 3.5–5.1)
Sodium: 140 mmol/L (ref 135–145)
TCO2: 25 mmol/L (ref 22–32)
pCO2 arterial: 50.3 mm[Hg] — ABNORMAL HIGH (ref 32–48)
pH, Arterial: 7.266 — ABNORMAL LOW (ref 7.35–7.45)
pO2, Arterial: 93 mm[Hg] (ref 83–108)

## 2023-03-03 LAB — ABO/RH: ABO/RH(D): A POS

## 2023-03-03 LAB — PREPARE RBC (CROSSMATCH)

## 2023-03-03 SURGERY — EXCISION, MASS, MEDIASTINUM, ROBOT-ASSISTED
Anesthesia: General | Site: Chest | Laterality: Left

## 2023-03-03 MED ORDER — ROSUVASTATIN CALCIUM 20 MG PO TABS
20.0000 mg | ORAL_TABLET | Freq: Every day | ORAL | Status: DC
  Administered 2023-03-03 – 2023-03-07 (×5): 20 mg via ORAL
  Filled 2023-03-03 (×5): qty 1

## 2023-03-03 MED ORDER — ASPIRIN 81 MG PO CHEW
81.0000 mg | CHEWABLE_TABLET | Freq: Every day | ORAL | Status: DC
  Administered 2023-03-04 – 2023-03-07 (×4): 81 mg via ORAL
  Filled 2023-03-03 (×4): qty 1

## 2023-03-03 MED ORDER — BUPIVACAINE HCL (PF) 0.5 % IJ SOLN
INTRAMUSCULAR | Status: AC
  Filled 2023-03-03: qty 30

## 2023-03-03 MED ORDER — 0.9 % SODIUM CHLORIDE (POUR BTL) OPTIME
TOPICAL | Status: DC | PRN
  Administered 2023-03-03: 2000 mL

## 2023-03-03 MED ORDER — ORAL CARE MOUTH RINSE: 15.0000 mL | OROMUCOSAL | Status: DC | PRN

## 2023-03-03 MED ORDER — DEXMEDETOMIDINE HCL IN NACL 80 MCG/20ML IV SOLN
INTRAVENOUS | Status: DC | PRN
  Administered 2023-03-03 (×2): 4 ug via INTRAVENOUS

## 2023-03-03 MED ORDER — LEVOTHYROXINE SODIUM 88 MCG PO TABS
88.0000 ug | ORAL_TABLET | ORAL | Status: DC
  Administered 2023-03-04 – 2023-03-07 (×4): 88 ug via ORAL
  Filled 2023-03-03 (×4): qty 1

## 2023-03-03 MED ORDER — ONDANSETRON HCL 4 MG/2ML IJ SOLN
INTRAMUSCULAR | Status: DC | PRN
  Administered 2023-03-03: 4 mg via INTRAVENOUS

## 2023-03-03 MED ORDER — ESCITALOPRAM OXALATE 10 MG PO TABS
10.0000 mg | ORAL_TABLET | Freq: Every day | ORAL | Status: DC
  Administered 2023-03-03 – 2023-03-07 (×5): 10 mg via ORAL
  Filled 2023-03-03 (×5): qty 1

## 2023-03-03 MED ORDER — PROPOFOL 10 MG/ML IV BOLUS
INTRAVENOUS | Status: DC | PRN
  Administered 2023-03-03: 100 mg via INTRAVENOUS
  Administered 2023-03-03: 40 mg via INTRAVENOUS

## 2023-03-03 MED ORDER — ROCURONIUM BROMIDE 10 MG/ML (PF) SYRINGE
PREFILLED_SYRINGE | INTRAVENOUS | Status: AC
  Filled 2023-03-03: qty 10

## 2023-03-03 MED ORDER — FENTANYL CITRATE PF 50 MCG/ML IJ SOSY
25.0000 ug | PREFILLED_SYRINGE | INTRAMUSCULAR | Status: DC | PRN
Start: 2023-03-03 — End: 2023-03-04
  Administered 2023-03-03 – 2023-03-04 (×4): 50 ug via INTRAVENOUS
  Filled 2023-03-03 (×4): qty 1

## 2023-03-03 MED ORDER — ONDANSETRON HCL 4 MG/2ML IJ SOLN
INTRAMUSCULAR | Status: AC
  Filled 2023-03-03: qty 2

## 2023-03-03 MED ORDER — CEFAZOLIN SODIUM-DEXTROSE 2-4 GM/100ML-% IV SOLN
2.0000 g | Freq: Three times a day (TID) | INTRAVENOUS | Status: AC
  Administered 2023-03-03 (×2): 2 g via INTRAVENOUS
  Filled 2023-03-03 (×2): qty 100

## 2023-03-03 MED ORDER — ACETAMINOPHEN 500 MG PO TABS
1000.0000 mg | ORAL_TABLET | Freq: Once | ORAL | Status: AC
  Administered 2023-03-03: 1000 mg via ORAL
  Filled 2023-03-03: qty 2

## 2023-03-03 MED ORDER — DEXAMETHASONE SODIUM PHOSPHATE 10 MG/ML IJ SOLN
INTRAMUSCULAR | Status: DC | PRN
  Administered 2023-03-03: 10 mg via INTRAVENOUS

## 2023-03-03 MED ORDER — GABAPENTIN 300 MG PO CAPS: 300.0000 mg | ORAL_CAPSULE | Freq: Two times a day (BID) | ORAL | Status: DC

## 2023-03-03 MED ORDER — PROPOFOL 10 MG/ML IV BOLUS
INTRAVENOUS | Status: AC
  Filled 2023-03-03: qty 20

## 2023-03-03 MED ORDER — ACETAMINOPHEN 500 MG PO TABS
1000.0000 mg | ORAL_TABLET | Freq: Four times a day (QID) | ORAL | Status: DC
Start: 2023-03-03 — End: 2023-03-07
  Administered 2023-03-03 – 2023-03-07 (×14): 1000 mg via ORAL
  Filled 2023-03-03 (×14): qty 2

## 2023-03-03 MED ORDER — SODIUM CHLORIDE 0.9 % IV SOLN: INTRAVENOUS | Status: DC

## 2023-03-03 MED ORDER — KETOROLAC TROMETHAMINE 15 MG/ML IJ SOLN
INTRAMUSCULAR | Status: AC
  Filled 2023-03-03: qty 1

## 2023-03-03 MED ORDER — IPRATROPIUM-ALBUTEROL 0.5-2.5 (3) MG/3ML IN SOLN
RESPIRATORY_TRACT | Status: AC
  Filled 2023-03-03: qty 3

## 2023-03-03 MED ORDER — INFLUENZA VAC A&B SURF ANT ADJ 0.5 ML IM SUSY
0.5000 mL | PREFILLED_SYRINGE | INTRAMUSCULAR | Status: AC
  Administered 2023-03-07: 0.5 mL via INTRAMUSCULAR
  Filled 2023-03-03: qty 0.5

## 2023-03-03 MED ORDER — FENTANYL CITRATE (PF) 100 MCG/2ML IJ SOLN
25.0000 ug | INTRAMUSCULAR | Status: DC | PRN
  Administered 2023-03-03: 50 ug via INTRAVENOUS

## 2023-03-03 MED ORDER — ALBUMIN HUMAN 5 % IV SOLN: INTRAVENOUS | Status: DC | PRN

## 2023-03-03 MED ORDER — BUPIVACAINE LIPOSOME 1.3 % IJ SUSP
INTRAMUSCULAR | Status: DC | PRN
  Administered 2023-03-03: 35 mL

## 2023-03-03 MED ORDER — BUPIVACAINE LIPOSOME 1.3 % IJ SUSP
INTRAMUSCULAR | Status: AC
  Filled 2023-03-03: qty 20

## 2023-03-03 MED ORDER — MIDAZOLAM HCL 2 MG/2ML IJ SOLN
INTRAMUSCULAR | Status: DC | PRN
  Administered 2023-03-03: 1 mg via INTRAVENOUS

## 2023-03-03 MED ORDER — SUGAMMADEX SODIUM 200 MG/2ML IV SOLN
INTRAVENOUS | Status: DC | PRN
  Administered 2023-03-03 (×2): 200 mg via INTRAVENOUS

## 2023-03-03 MED ORDER — CHLORHEXIDINE GLUCONATE CLOTH 2 % EX PADS
6.0000 | MEDICATED_PAD | Freq: Every day | CUTANEOUS | Status: DC
Start: 2023-03-04 — End: 2023-03-07
  Administered 2023-03-04 – 2023-03-06 (×2): 6 via TOPICAL

## 2023-03-03 MED ORDER — SODIUM CHLORIDE 0.9 % IV SOLN
INTRAVENOUS | Status: AC | PRN
  Administered 2023-03-03: 1000 mL

## 2023-03-03 MED ORDER — LIDOCAINE 2% (20 MG/ML) 5 ML SYRINGE
INTRAMUSCULAR | Status: DC | PRN
  Administered 2023-03-03: 60 mg via INTRAVENOUS

## 2023-03-03 MED ORDER — ACETAMINOPHEN 160 MG/5ML PO SOLN
1000.0000 mg | Freq: Four times a day (QID) | ORAL | Status: DC
Start: 2023-03-03 — End: 2023-03-07

## 2023-03-03 MED ORDER — LABETALOL HCL 5 MG/ML IV SOLN
INTRAVENOUS | Status: DC | PRN
  Administered 2023-03-03 (×3): 5 mg via INTRAVENOUS

## 2023-03-03 MED ORDER — FENTANYL CITRATE (PF) 250 MCG/5ML IJ SOLN
INTRAMUSCULAR | Status: DC | PRN
  Administered 2023-03-03 (×2): 50 ug via INTRAVENOUS
  Administered 2023-03-03: 100 ug via INTRAVENOUS
  Administered 2023-03-03: 50 ug via INTRAVENOUS

## 2023-03-03 MED ORDER — ETOMIDATE 2 MG/ML IV SOLN
INTRAVENOUS | Status: AC
  Filled 2023-03-03: qty 10

## 2023-03-03 MED ORDER — IPRATROPIUM-ALBUTEROL 0.5-2.5 (3) MG/3ML IN SOLN
3.0000 mL | Freq: Once | RESPIRATORY_TRACT | Status: AC
  Administered 2023-03-03: 3 mL via RESPIRATORY_TRACT

## 2023-03-03 MED ORDER — SENNOSIDES-DOCUSATE SODIUM 8.6-50 MG PO TABS
1.0000 | ORAL_TABLET | Freq: Every day | ORAL | Status: DC
  Administered 2023-03-04 – 2023-03-05 (×2): 1 via ORAL
  Filled 2023-03-03 (×3): qty 1

## 2023-03-03 MED ORDER — LOSARTAN POTASSIUM 25 MG PO TABS: 25.0000 mg | ORAL_TABLET | Freq: Every evening | ORAL | Status: DC

## 2023-03-03 MED ORDER — CEFAZOLIN SODIUM-DEXTROSE 2-4 GM/100ML-% IV SOLN
2.0000 g | INTRAVENOUS | Status: AC
Start: 2023-03-03 — End: 2023-03-03
  Administered 2023-03-03: 2 g via INTRAVENOUS
  Filled 2023-03-03: qty 100

## 2023-03-03 MED ORDER — CHLORHEXIDINE GLUCONATE 0.12 % MT SOLN
15.0000 mL | Freq: Once | OROMUCOSAL | Status: AC
  Administered 2023-03-03: 15 mL via OROMUCOSAL
  Filled 2023-03-03: qty 15

## 2023-03-03 MED ORDER — MIDAZOLAM HCL 2 MG/2ML IJ SOLN
INTRAMUSCULAR | Status: AC
  Filled 2023-03-03: qty 2

## 2023-03-03 MED ORDER — KETOROLAC TROMETHAMINE 15 MG/ML IJ SOLN
15.0000 mg | Freq: Four times a day (QID) | INTRAMUSCULAR | Status: AC
  Administered 2023-03-03 – 2023-03-05 (×8): 15 mg via INTRAVENOUS
  Filled 2023-03-03 (×7): qty 1

## 2023-03-03 MED ORDER — FENTANYL CITRATE (PF) 100 MCG/2ML IJ SOLN
INTRAMUSCULAR | Status: AC
  Filled 2023-03-03: qty 2

## 2023-03-03 MED ORDER — EPHEDRINE SULFATE-NACL 50-0.9 MG/10ML-% IV SOSY
PREFILLED_SYRINGE | INTRAVENOUS | Status: DC | PRN
  Administered 2023-03-03: 5 mg via INTRAVENOUS

## 2023-03-03 MED ORDER — GABAPENTIN 300 MG PO CAPS
300.0000 mg | ORAL_CAPSULE | Freq: Every day | ORAL | Status: DC
  Administered 2023-03-03: 300 mg via ORAL
  Filled 2023-03-03: qty 1

## 2023-03-03 MED ORDER — EPHEDRINE 5 MG/ML INJ
INTRAVENOUS | Status: AC
  Filled 2023-03-03: qty 5

## 2023-03-03 MED ORDER — PANTOPRAZOLE SODIUM 40 MG PO TBEC
40.0000 mg | DELAYED_RELEASE_TABLET | Freq: Every day | ORAL | Status: DC
  Administered 2023-03-04 – 2023-03-07 (×4): 40 mg via ORAL
  Filled 2023-03-03 (×4): qty 1

## 2023-03-03 MED ORDER — BISACODYL 5 MG PO TBEC
10.0000 mg | DELAYED_RELEASE_TABLET | Freq: Every day | ORAL | Status: DC
  Administered 2023-03-04 – 2023-03-06 (×3): 10 mg via ORAL
  Filled 2023-03-03 (×3): qty 2

## 2023-03-03 MED ORDER — SODIUM CHLORIDE 0.9 % IV SOLN: 10.0000 mL/h | Freq: Once | INTRAVENOUS | Status: AC

## 2023-03-03 MED ORDER — ROCURONIUM BROMIDE 10 MG/ML (PF) SYRINGE
PREFILLED_SYRINGE | INTRAVENOUS | Status: DC | PRN
  Administered 2023-03-03: 30 mg via INTRAVENOUS
  Administered 2023-03-03: 40 mg via INTRAVENOUS

## 2023-03-03 MED ORDER — DEXAMETHASONE SODIUM PHOSPHATE 10 MG/ML IJ SOLN
INTRAMUSCULAR | Status: AC
  Filled 2023-03-03: qty 1

## 2023-03-03 MED ORDER — ONDANSETRON HCL 4 MG/2ML IJ SOLN
4.0000 mg | Freq: Four times a day (QID) | INTRAMUSCULAR | Status: DC | PRN
Start: 2023-03-03 — End: 2023-03-07
  Administered 2023-03-04: 4 mg via INTRAVENOUS
  Filled 2023-03-03: qty 2

## 2023-03-03 MED ORDER — VASOPRESSIN 20 UNIT/ML IV SOLN
INTRAVENOUS | Status: AC
  Filled 2023-03-03: qty 1

## 2023-03-03 MED ORDER — ENOXAPARIN SODIUM 40 MG/0.4ML IJ SOSY
40.0000 mg | PREFILLED_SYRINGE | INTRAMUSCULAR | Status: DC
  Administered 2023-03-03 – 2023-03-06 (×4): 40 mg via SUBCUTANEOUS
  Filled 2023-03-03 (×4): qty 0.4

## 2023-03-03 MED ORDER — FENTANYL CITRATE (PF) 250 MCG/5ML IJ SOLN
INTRAMUSCULAR | Status: AC
  Filled 2023-03-03: qty 5

## 2023-03-03 MED ORDER — ESMOLOL HCL 100 MG/10ML IV SOLN
INTRAVENOUS | Status: DC | PRN
  Administered 2023-03-03 (×2): 30 mg via INTRAVENOUS

## 2023-03-03 MED ORDER — PHENYLEPHRINE 80 MCG/ML (10ML) SYRINGE FOR IV PUSH (FOR BLOOD PRESSURE SUPPORT)
PREFILLED_SYRINGE | INTRAVENOUS | Status: AC
  Filled 2023-03-03: qty 10

## 2023-03-03 MED ORDER — AMISULPRIDE (ANTIEMETIC) 5 MG/2ML IV SOLN: 10.0000 mg | Freq: Once | INTRAVENOUS | Status: DC | PRN

## 2023-03-03 MED ORDER — ORAL CARE MOUTH RINSE: 15.0000 mL | Freq: Once | OROMUCOSAL | Status: AC

## 2023-03-03 MED ORDER — LIDOCAINE 2% (20 MG/ML) 5 ML SYRINGE
INTRAMUSCULAR | Status: AC
  Filled 2023-03-03: qty 5

## 2023-03-03 MED ORDER — METHOCARBAMOL 500 MG PO TABS: 500.0000 mg | ORAL_TABLET | Freq: Every day | ORAL | Status: DC

## 2023-03-03 MED ORDER — LACTATED RINGERS IV SOLN: INTRAVENOUS | Status: DC | PRN

## 2023-03-03 MED ORDER — CLEVIDIPINE BUTYRATE 0.5 MG/ML IV EMUL
INTRAVENOUS | Status: DC | PRN
  Administered 2023-03-03: 1 mg/h via INTRAVENOUS

## 2023-03-03 SURGICAL SUPPLY — 81 items
ADH SKN CLS APL DERMABOND .7 (GAUZE/BANDAGES/DRESSINGS) ×1
BAG TISS RTRVL C300 12X14 (MISCELLANEOUS) ×1
BLADE STERNUM SYSTEM 6 (BLADE) IMPLANT
CANNULA REDUCER 12-8 DVNC XI (CANNULA) IMPLANT
CNTNR URN SCR LID CUP LEK RST (MISCELLANEOUS) ×4 IMPLANT
CONN ST 1/4X3/8 BEN (MISCELLANEOUS) IMPLANT
CONT SPEC 4OZ STRL OR WHT (MISCELLANEOUS) ×7
DEFOGGER SCOPE WARMER CLEARIFY (MISCELLANEOUS) ×1 IMPLANT
DERMABOND ADVANCED .7 DNX12 (GAUZE/BANDAGES/DRESSINGS) IMPLANT
DRAIN CHANNEL 28F RND 3/8 FF (WOUND CARE) ×1 IMPLANT
DRAPE ARM DVNC X/XI (DISPOSABLE) ×4 IMPLANT
DRAPE COLUMN DVNC XI (DISPOSABLE) ×1 IMPLANT
DRAPE CV SPLIT W-CLR ANES SCRN (DRAPES) ×1 IMPLANT
DRAPE HALF SHEET 40X57 (DRAPES) IMPLANT
DRAPE SURG ORHT 6 SPLT 77X108 (DRAPES) ×1 IMPLANT
DRIVER NDL MEGA SUTCUT DVNCXI (INSTRUMENTS) IMPLANT
DRIVER NDLE MEGA SUTCUT DVNCXI (INSTRUMENTS) IMPLANT
ELECT REM PT RETURN 9FT ADLT (ELECTROSURGICAL) ×1
ELECTRODE REM PT RTRN 9FT ADLT (ELECTROSURGICAL) ×1 IMPLANT
FELT TEFLON 1X6 (MISCELLANEOUS) IMPLANT
FORCEPS BPLR FENES DVNC XI (FORCEP) IMPLANT
FORCEPS BPLR LNG DVNC XI (INSTRUMENTS) IMPLANT
GAUZE KITTNER 4X8 (MISCELLANEOUS) ×2 IMPLANT
GAUZE SPONGE 4X4 12PLY STRL (GAUZE/BANDAGES/DRESSINGS) IMPLANT
GLOVE SS BIOGEL STRL SZ 7.5 (GLOVE) ×2 IMPLANT
GLOVE SURG SS PI 8.0 STRL IVOR (GLOVE) ×1 IMPLANT
GOWN STRL REUS W/ TWL LRG LVL3 (GOWN DISPOSABLE) ×1 IMPLANT
GOWN STRL REUS W/ TWL XL LVL3 (GOWN DISPOSABLE) ×2 IMPLANT
GOWN STRL REUS W/TWL 2XL LVL3 (GOWN DISPOSABLE) ×1 IMPLANT
GOWN STRL REUS W/TWL LRG LVL3 (GOWN DISPOSABLE) ×1
GOWN STRL REUS W/TWL XL LVL3 (GOWN DISPOSABLE) ×2
GRASPER TIP-UP FEN DVNC XI (INSTRUMENTS) IMPLANT
HEMOSTAT SURGICEL 2X14 (HEMOSTASIS) ×2 IMPLANT
IRRIGATION STRYKERFLOW (MISCELLANEOUS) ×1 IMPLANT
IRRIGATOR STRYKERFLOW (MISCELLANEOUS) ×1
IV SODIUM CHL 0.9% 500ML (IV SOLUTION) ×1 IMPLANT
KIT SUCTION CATH 14FR (SUCTIONS) IMPLANT
NDL 25GX 5/8IN NON SAFETY (NEEDLE) ×1 IMPLANT
NDL HYPO 25GX1X1/2 BEV (NEEDLE) ×1 IMPLANT
NDL SPNL 22GX3.5 QUINCKE BK (NEEDLE) ×1 IMPLANT
NEEDLE 25GX 5/8IN NON SAFETY (NEEDLE) ×1 IMPLANT
NEEDLE HYPO 25GX1X1/2 BEV (NEEDLE) ×1 IMPLANT
NEEDLE SPNL 22GX3.5 QUINCKE BK (NEEDLE) ×1 IMPLANT
PACK CHEST (CUSTOM PROCEDURE TRAY) ×1 IMPLANT
PAD ARMBOARD 7.5X6 YLW CONV (MISCELLANEOUS) ×2 IMPLANT
PAD ELECT DEFIB RADIOL ZOLL (MISCELLANEOUS) IMPLANT
PORT ACCESS TROCAR AIRSEAL 12 (TROCAR) ×1 IMPLANT
RELOAD STAPLE 45 3.5 BLU DVNC (STAPLE) IMPLANT
RELOAD STAPLE 45 4.3 GRN DVNC (STAPLE) IMPLANT
RELOAD STAPLER 3.5X45 BLU DVNC (STAPLE) ×2 IMPLANT
RELOAD STAPLER 4.3X45 GRN DVNC (STAPLE) ×3 IMPLANT
SEAL UNIV 5-12 XI (MISCELLANEOUS) ×3 IMPLANT
SEALER SYNCHRO 8 IS4000 DVNC (MISCELLANEOUS) IMPLANT
SET TRI-LUMEN FLTR TB AIRSEAL (TUBING) ×1 IMPLANT
SOL ELECTROSURG ANTI STICK (MISCELLANEOUS) ×1
SOLUTION ELECTROSURG ANTI STCK (MISCELLANEOUS) ×1 IMPLANT
SPONGE TONSIL 1 RF SGL (DISPOSABLE) ×1 IMPLANT
STAPLER 45 SUREFORM DVNC (STAPLE) IMPLANT
STAPLER RELOAD 3.5X45 BLU DVNC (STAPLE) ×2
STAPLER RELOAD 4.3X45 GRN DVNC (STAPLE) ×3
SUT PROLENE 4 0 RB 1 (SUTURE)
SUT PROLENE 4 0 SH DA (SUTURE) IMPLANT
SUT PROLENE 4-0 RB1 .5 CRCL 36 (SUTURE) IMPLANT
SUT SILK 1 MH (SUTURE) ×1 IMPLANT
SUT SILK 2 0 SH (SUTURE) IMPLANT
SUT SILK 2 0 SH CR/8 (SUTURE) IMPLANT
SUT STEEL 6MS V (SUTURE) IMPLANT
SUT STEEL SZ 6 DBL 3X14 BALL (SUTURE) IMPLANT
SUT VIC AB 1 CTX 36 (SUTURE) ×1
SUT VIC AB 1 CTX36XBRD ANBCTR (SUTURE) ×1 IMPLANT
SUT VIC AB 2-0 CTX 36 (SUTURE) ×1 IMPLANT
SUT VIC AB 3-0 X1 27 (SUTURE) ×2 IMPLANT
SUT VICRYL 0 TIES 12 18 (SUTURE) ×1 IMPLANT
SUT VICRYL 0 UR6 27IN ABS (SUTURE) ×2 IMPLANT
SYR 20CC LL (SYRINGE) ×2 IMPLANT
SYSTEM RETRIEVAL ANCHOR 12 (MISCELLANEOUS) IMPLANT
SYSTEM SAHARA CHEST DRAIN ATS (WOUND CARE) IMPLANT
TAPE CLOTH 4X10 WHT NS (GAUZE/BANDAGES/DRESSINGS) IMPLANT
TOWEL GREEN STERILE (TOWEL DISPOSABLE) IMPLANT
TOWEL GREEN STERILE FF (TOWEL DISPOSABLE) IMPLANT
TRAY FOLEY SLVR 16FR TEMP STAT (SET/KITS/TRAYS/PACK) ×1 IMPLANT

## 2023-03-03 NOTE — Progress Notes (Signed)
Patient has been received from PACU, he is alert and oriented *4. Vital signs taken, CCMD notified, assessment done, Call bell in reach, orientation given about the unit.  03/03/23 1528  Vitals  Temp 97.7 F (36.5 C)  Temp Source Oral  BP (!) 146/73  MAP (mmHg) 94  BP Location Right Arm  BP Method Automatic  Patient Position (if appropriate) Lying  Pulse Rate 72  Pulse Rate Source Monitor  ECG Heart Rate 74  Resp (!) 21  Level of Consciousness  Level of Consciousness Alert  MEWS COLOR  MEWS Score Color Green  Oxygen Therapy  SpO2 100 %  O2 Device Nasal Cannula  O2 Flow Rate (L/min) 2 L/min  Art Line  Arterial Line BP 156/70  Pain Assessment  Pain Scale 0-10  Pain Score 8  Pain Type Surgical pain  Pain Location Chest  Pain Orientation Left  Pain Descriptors / Indicators Aching  Pain Frequency Constant  Pain Onset On-going  Pain Intervention(s) Medication (See eMAR)  MEWS Score  MEWS Temp 0  MEWS Systolic 0  MEWS Pulse 0  MEWS RR 1  MEWS LOC 0  MEWS Score 1

## 2023-03-03 NOTE — Plan of Care (Addendum)
Patient remains on MCH-4E at time of writing. Chest tube to water seal to left chest. Left radial art line. Foley catheter in place; plan to remove tomorrow AM. CVC to RIJ with MIVF infusing. No acute changes overnight as of time of writing.  Edit: 2130 hrs: Consult placed to pastoral care for follow-up on creation of advanced directives. Standing order placed for flu vaccine (prior to discharge) per patient request. Patient states he has already received pneumococcal vaccine; however, this RN cannot locate evidence of vaccination.   Problem: Education: Goal: Knowledge of disease or condition will improve Outcome: Progressing Goal: Knowledge of the prescribed therapeutic regimen will improve Outcome: Progressing   Problem: Activity: Goal: Risk for activity intolerance will decrease Outcome: Progressing   Problem: Cardiac: Goal: Will achieve and/or maintain hemodynamic stability Outcome: Progressing   Problem: Clinical Measurements: Goal: Postoperative complications will be avoided or minimized Outcome: Progressing   Problem: Respiratory: Goal: Respiratory status will improve Outcome: Progressing   Problem: Pain Management: Goal: Pain level will decrease Outcome: Progressing   Problem: Skin Integrity: Goal: Wound healing without signs and symptoms infection will improve Outcome: Progressing

## 2023-03-03 NOTE — Transfer of Care (Signed)
Immediate Anesthesia Transfer of Care Note  Patient: Angel [name]  Procedure(s) Performed: XI ROBOTIC ASSISTED RESECTION OF ANTERIOR MEDIASTINAL MASS  WITH NERVE BLOCK (Left: Chest)  Patient Location: PACU  Anesthesia Type:General  Level of Consciousness: awake and sedated  Airway & Oxygen Therapy: Patient connected to face mask oxygen  Post-op Assessment: Report given to RN  Post vital signs: Reviewed and stable  Last Vitals:  Vitals Value Taken Time  BP 128/66 03/03/23 1145  Temp 36.2 C 03/03/23 1145  Pulse 73 03/03/23 1149  Resp 29 03/03/23 1149  SpO2 97 % 03/03/23 1149  Vitals shown include unfiled device data.  Last Pain:  Vitals:   03/03/23 1145  TempSrc:   PainSc: Asleep         Complications: No notable events documented.

## 2023-03-03 NOTE — Anesthesia Procedure Notes (Signed)
Arterial Line Insertion Start/End11/07/2022 6:50 AM, 03/03/2023 7:10 AM Performed by: Marcene Duos, MD, Kayleen Memos, CRNA, CRNA  Patient location: Pre-op. Preanesthetic checklist: patient identified, IV checked, site marked, risks and benefits discussed, surgical consent, monitors and equipment checked, pre-op evaluation, timeout performed and anesthesia consent Lidocaine 1% used for infiltration Left, radial was placed Catheter size: 20 G Hand hygiene performed   Attempts: 1 Procedure performed without using ultrasound guided technique. Following insertion, dressing applied. Post procedure assessment: unchanged  Patient tolerated the procedure well with no immediate complications.

## 2023-03-03 NOTE — Interval H&P Note (Signed)
History and Physical Interval Note:  Anemic on preop testing with Hgb 7.9, repeat this AM 7.6 Will proceed  03/03/2023 7:13 AM  Angel [name]  has presented today for surgery, with the diagnosis of ANTERIOR MEDIASTINAL MASS.  The various methods of treatment have been discussed with the patient and family. After consideration of risks, benefits and other options for treatment, the patient has consented to  Procedure(s): XI ROBOTIC ASSISTED RESECTION OF ANTERIOR MEDIASTINAL MASS (Left) as a surgical intervention.  The patient's history has been reviewed, patient examined, no change in status, stable for surgery.  I have reviewed the patient's chart and labs.  Questions were answered to the patient's satisfaction.     [name]

## 2023-03-03 NOTE — Hospital Course (Addendum)
HPI:  This is a 72 year old man with a past history significant for squamous cell carcinoma of the base of the tongue, tobacco abuse, marijuana use, lymphedema, peripheral arterial disease, and syncope.   He had a CT for coronary calcium screening back in March.  Coronary calcium score was 323 which was 65th percentile for age.  He was noted to have a 4.8 x 4 cm complex cystic anterior mediastinal mass.   He had a CT in September which showed the mass was still present.  Complete measurement showed 5.4 x 3.6 x 5.7 cm cystic mass with an enhancing solid 2.4 x 2 cm nodule superiorly.     He smoked for 45 years prior to quitting in 2019.  Complains of occasional sharp left-sided chest pain lasting usually less than a minute.  Not associated with exertion.  Does have claudication.  Had an iliac stent in early August by Dr. Jacinto Halim.  Has had syncopal spells.  No double vision or weakness.  Hospital Course: Patient underwent a Xi assisted left VATS, resection of thymus. Patient was extubated and transported from the OR to PACU in stable condition. Chest tube was to water seal and there was no air leak. A line was removed and IVF were stopped on 11/05. He had a fair amount of pain on the left side (wounds, chest tube, fractured rib) so medications were adjusted accordingly. He was hypertensive and was restarted on Losartan 25 mg daily. Some of the hypertension was likely related to uncontrolled pain. He has been tolerating a diet. He was on 2 liters of oxygen via [place] and was weaned to room air. Chest tube was removed on 11/06. Same day chest x ray showed trace apical pneumothorax on the left. His HR increased into the 150's and rhythm appeared to be a fib with RVR. He was put on an Amiodarone drip. He converted to sinus rhythm and was transitioned to oral Amiodarone.  He was started on home Plavix regimen prior to discharge.  He was constipated and treated with laxative with relief of symptoms.   All wounds are  clean, dry, healing without signs of infection. Chest x ray today shows stable left apical pneumothorax, probable small left pleural effusion. He is stable for discharge today.

## 2023-03-03 NOTE — Op Note (Unsigned)
NAME: Schroeter, MARKEY DEADY MEDICAL RECORD NO: [id number] ACCOUNT NO: [id number] DATE OF BIRTH: [birth year] FACILITY: [facility] LOCATION: [facility] PHYSICIAN: Salvatore Decent. Dorris Fetch, MD  Operative Report   DATE OF PROCEDURE: 03/03/2023  PREOPERATIVE DIAGNOSIS:  Anterior mediastinal mass, suspect thymoma.  POSTOPERATIVE DIAGNOSIS:  Anterior mediastinal mass, suspect thymoma.  PROCEDURE:  Xi robotic-assisted left VATS,  Thymectomy.  SURGEON:  Salvatore Decent. Dorris Fetch, MD  ASSISTANT:  Doree Fudge, PA.  ANESTHESIA:  General.  FINDINGS:  Large mass directly adjacent, but not invading the phrenic nerve.  Adhesions to left upper lobe necessitating en bloc wedge resection.  CLINICAL NOTE: Mr. Liebman is a 72 year old gentleman who was found to have a complex cystic anterior mediastinal mass on a CT for coronary calcium scoring.  He was advised to undergo surgical resection.  The indications, risks, benefits, and alternatives were discussed in detail with the patient.  Particular concern was the risk for phrenic nerve dysfunction due to the location of the mass.  He understood and accepted the risks and agreed to proceed.  DESCRIPTION OF THE PROCEDURE: Mr. Errington was brought to the preoperative holding area on 03/03/2023.  Anesthesia placed an arterial blood pressure monitoring line and a central venous catheter.  He was taken to the operating room, anesthetized and intubated.  A Foley catheter was placed.  Intravenous antibiotics were administered.  Sequential compression devices were placed on the calves for DVT prophylaxis.  He was in a supine position with the left chest elevated.  A Bair Hugger was placed for active warming.  The chest was prepped and draped in the usual sterile fashion.  Single lung ventilation of the right lung was initiated and was tolerated well throughout the procedure.  A timeout was performed.  A solution containing 20 mL of liposomal bupivacaine and 30 mL of 0.5% bupivacaine was  prepared.  This solution was used for local at the incision sites.  An incision was made approximately in the fifth interspace in the anterior axillary line and an 8 mm robotic port was inserted.  After confirming intrapleural placement, carbon dioxide was insufflated per protocol.  An 8 mm port was placed in the eighth interspace and then another 8 mm port was placed approximately in the third interspace.  A 12-mm AirSeal port was placed in the seventh interspace slightly posterior to the robotic ports.  The robot was deployed.  The camera arm was docked.  The remaining arms were docked.  The robotic instruments were inserted with thoracoscopic visualization.  Dissection was initially difficult due to the size of the mass and relatively limited space in the chest.  There were adhesions noted to the anterior aspect of the left upper lobe.  Initially, this appeared to be thin, filmy adhesions.  But when beginning to take the adhesions down, it was noted that the adhesions were concerning for possible invasion.  The pericardial fat pad was identified as was the anterior mediastinal fat pad and the anterior mediastinal fat pad was resected in its entirety.  Bipolar cautery was used for the dissection.  Anteriorly, the pleural reflection was divided.  There was very limited visualization superiorly because of the mass.  A decision was made to proceed with en bloc wedge resection of the left upper lobe and the area of adhesions in case there was invasion.  The 8 mm port was changed to a 12 mm port in the eighth interspace.  A robotic stapler then was used to remove a small wedge of the left upper lobe  in the area of the adhesions.  This allowed upper retraction on the mass.  The mass extended directly adjacent to the phrenic nerve, but there was no evidence of involvement of the nerve itself.  The dissection was carried along the phrenic nerve in close proximity but the nerve was preserved throughout its entire   length.  Dissection then proceeded on the right side mobilizing the entire anterior mediastinal fat pad.  Care was taken not to use cautery in vicinity of the right  phrenic nerve.  The right pleural space was entered.  Superiorly, the anatomy was initially difficult to visualize, particularly the innominate vein.  The left superior pole was brought down with gentle traction and the vessels were divided with the SynchroSeal device and then in the similar fashion, the right superior pole was mobilized and those vessels were also divided using the SynchroSeal device.  The innominate vein was now visible.  There were several vessels extending from the mass to the innominate vein which were divided with the SynchroSeal device.  Once the mass had been completely mobilized, the sponges used during the dissection were removed.  A 12 mm endoscopic retrieval bag was placed through the AirSeal port and the mass was manipulated into the bag which was then closed and brought to the edge of the chest wall.  Inspection of the surgical site revealed no ongoing bleeding.  The robotic instruments were removed and the robot was undocked.  The specimen was removed through the AirSeal port and sent for permanent pathology.  The specimen was marked for orientation. Of note, the cystic portion of the mass did rupture with removal through the chest wall, but this was all contained within the endoscopic retrieval bag.  The chest was copiously irrigated with saline.  A 28-French Blake drain was placed through the eighth interspace incision and directed to the apex.  It was secured with #1 silk suture.  Dual lung ventilation was resumed.  The incisions were closed in standard fashion.  The chest tube was placed to a Pleur-Evac on waterseal.  The patient then was extubated in the operating room and taken to the postanesthetic care unit in fair condition.  All sponge, needle and instrument counts were correct at the end of the  procedure.  Experienced assistance was necessary for this case due to surgical complexity.  Doree Fudge assisted with port placement, robot docking and undocking, instrument exchange, specimen retrieval, suctioning and wound closure.   NIK D: 03/03/2023 3:20:30 pm T: 03/03/2023 10:59:00 pm  JOB: [id number]/ [id number]

## 2023-03-03 NOTE — Plan of Care (Signed)
  Problem: Activity: Goal: Risk for activity intolerance will decrease Outcome: Progressing   Problem: Education: Goal: Knowledge of disease or condition will improve Outcome: Progressing   Problem: Education: Goal: Knowledge of the prescribed therapeutic regimen will improve Outcome: Progressing   Problem: Clinical Measurements: Goal: Postoperative complications will be avoided or minimized Outcome: Progressing   Problem: Cardiac: Goal: Will achieve and/or maintain hemodynamic stability Outcome: Progressing

## 2023-03-03 NOTE — Discharge Summary (Addendum)
[address]       [place] [address]             [phone]    Physician Discharge Summary  Patient ID: Angel [name] MRN: [id number] DOB/AGE: May 03, 1950 72 y.o.  Admit date: 03/03/2023 Discharge date: 03/07/2023  Admission Diagnoses:  Patient Active Problem List   Diagnosis Date Noted   Mediastinal mass 03/03/2023   Claudication in peripheral vascular disease (HCC) 11/29/2022   Syncope and collapse 10/22/2022   Loop implantation:  [employer] Assert-IQ 3 Loop recorder 10/22/22 Serial # [id number] 10/22/2022   Acquired hypothyroidism 02/14/2017   Essential hypertension 02/14/2017   Lymphedema 05/05/2014   Headache 01/25/2014   Preventive measure 01/01/2014   Diarrhea 11/15/2013   Bilateral leg edema 11/15/2013   Insomnia 11/12/2013   Hypomagnesemia 11/12/2013   Generalized weakness 10/27/2013   Fall 10/27/2013   Thrombocytosis 10/27/2013   Leukocytosis 10/25/2013   Anemia in neoplastic disease 10/05/2013   Cancer of base of tongue (HCC) 09/17/2013     Discharge Diagnoses: Squamous cell carcinoma of the thymus-pT1a, pN1 Patient Active Problem List   Diagnosis Date Noted   Mediastinal mass 03/03/2023   Claudication in peripheral vascular disease (HCC) 11/29/2022   Syncope and collapse 10/22/2022   Loop implantation:  [employer] Assert-IQ 3 Loop recorder 10/22/22 Serial # [id number] 10/22/2022   Acquired hypothyroidism 02/14/2017   Essential hypertension 02/14/2017   Lymphedema 05/05/2014   Headache 01/25/2014   Preventive measure 01/01/2014   Diarrhea 11/15/2013   Bilateral leg edema 11/15/2013   Insomnia 11/12/2013   Hypomagnesemia 11/12/2013   Generalized weakness 10/27/2013   Fall 10/27/2013   Thrombocytosis 10/27/2013   Leukocytosis 10/25/2013   Anemia in neoplastic disease 10/05/2013   Cancer of base of tongue (HCC) 09/17/2013     Discharged Condition: Stable  HPI:  This is a 72 year old man with a past history significant for squamous  cell carcinoma of the base of the tongue, tobacco abuse, marijuana use, lymphedema, peripheral arterial disease, and syncope.   He had a CT for coronary calcium screening back in March.  Coronary calcium score was 323 which was 65th percentile for age.  He was noted to have a 4.8 x 4 cm complex cystic anterior mediastinal mass.   He had a CT in September which showed the mass was still present.  Complete measurement showed 5.4 x 3.6 x 5.7 cm cystic mass with an enhancing solid 2.4 x 2 cm nodule superiorly.     He smoked for 45 years prior to quitting in 2019.  Complains of occasional sharp left-sided chest pain lasting usually less than a minute.  Not associated with exertion.  Does have claudication.  Had an iliac stent in early August by Dr. Jacinto Halim.  Has had syncopal spells.  No double vision or weakness.  Hospital Course: Patient underwent a Xi assisted left VATS, resection of thymus. Patient was extubated and transported from the OR to PACU in stable condition. Chest tube was to water seal and there was no air leak. A line was removed and IVF were stopped on 11/05. He had a fair amount of pain on the left side (wounds, chest tube, fractured rib) so medications were adjusted accordingly. He was hypertensive and was restarted on Losartan 25 mg daily. Some of the hypertension was likely related to uncontrolled pain. He has been tolerating a diet. He was on 2 liters of oxygen via [place] and was weaned to room air. Chest tube was  removed on 11/06. Same day chest x ray showed trace apical pneumothorax on the left. His HR increased into the 150's and rhythm appeared to be a fib with RVR. He was put on an Amiodarone drip. He converted to sinus rhythm and was transitioned to oral Amiodarone.  He was started on home Plavix regimen prior to discharge.  He was constipated and treated with laxative with relief of symptoms.   All wounds are clean, dry, healing without signs of infection. Chest x ray today shows stable  left apical pneumothorax, probable small left pleural effusion. He is stable for discharge today.  Consults: None  Significant Diagnostic Studies:   Narrative & Impression  CLINICAL DATA:  Recent left chest tube removal. Follow up pneumothorax.   EXAM: CHEST - 2 VIEW   COMPARISON:  Radiographs 03/06/2023 and 03/05/2023.  CT 01/23/2023.   FINDINGS: Unchanged volume loss in the left hemithorax with medial left lower lobe airspace disease and a probable small left pleural effusion, unchanged. No significant changes small left apical pneumothorax. The right lung has a stable appearance. Persistent evidence of a small right pleural effusion.   The heart size and mediastinal contours are stable. Loop recorder noted in the left anterior chest wall. Moderate soft tissue emphysema throughout the left lateral chest wall appears unchanged. A large left-sided cervical rib is noted.   IMPRESSION: 1. No significant change in small left apical pneumothorax and left chest wall soft tissue emphysema. 2. Unchanged volume loss in the left hemithorax with medial left lower lobe airspace disease and probable small left pleural effusion. 3. Unchanged small right pleural effusion.     Electronically Signed   By: Carey Bullocks M.D.   On: 03/07/2023 11:27   CLINICAL DATA:  Pleural effusion, chest tube   EXAM: PORTABLE CHEST 1 VIEW   COMPARISON:  03/04/2023   FINDINGS: Unchanged AP portable chest radiograph. Left-sided chest tube remains in position with minimal, less than 5% left apical pneumothorax and unchanged elevation of the left hemidiaphragm. Right lung is normally aerated. Cardiomegaly. No acute osseous findings.   IMPRESSION: 1. Left-sided chest tube remains in position with minimal, less than 5% left apical pneumothorax and unchanged elevation of the left hemidiaphragm. 2. Cardiomegaly.     Electronically Signed   By: Jearld Lesch M.D.   On: 03/05/2023  10:15  Treatments: surgery:  Xi robotic-assisted left VATS,  Thymectomy by Dr. Dorris Fetch on 03/03/2023.  Pathology:**  Discharge Exam: Blood pressure 139/76, pulse 84, temperature (!) 97.5 F (36.4 C), temperature source Oral, resp. rate (!) 22, height 5\' 5"  (1.651 m), weight 57.2 kg, SpO2 99%.  Cardiovascular: RRR Pulmonary: Mostly clear Abdomen: Soft, non tender, bowel sounds present. Extremities: No lower extremity edema. Wounds: Dressing removed. Wound is clean and dry.     Discharge Medications: Allergies as of 03/07/2023   No Known Allergies      Medication List     STOP taking these medications    traMADol 50 MG tablet Commonly known as: ULTRAM       TAKE these medications    amiodarone 200 MG tablet Commonly known as: PACERONE Take 200 mg bid for 10 days then take 200 mg daily thereafter   aspirin 81 MG chewable tablet Commonly known as: Aspirin Childrens Chew 1 tablet (81 mg total) by mouth daily.   clopidogrel 75 MG tablet Commonly known as: PLAVIX Take 1 tablet (75 mg total) by mouth daily.   escitalopram 10 MG tablet Commonly known as: LEXAPRO Take  10 mg by mouth daily.   gabapentin 300 MG capsule Commonly known as: NEURONTIN Take 1 capsule (300 mg total) by mouth at bedtime.   levothyroxine 88 MCG tablet Commonly known as: SYNTHROID Take 88 mcg by mouth daily.   losartan 25 MG tablet Commonly known as: COZAAR Take 1 tablet by mouth in the evening   methocarbamol 500 MG tablet Commonly known as: ROBAXIN Take 500 mg by mouth daily.   multivitamin with minerals Tabs tablet Take 1 tablet by mouth daily. Centrum 50+   Oxycodone HCl 10 MG Tabs Take 1 tablet (10 mg total) by mouth every 6 (six) hours as needed for severe pain (pain score 7-10).   rosuvastatin 20 MG tablet Commonly known as: CRESTOR Take 1 tablet by mouth once daily   Vitamin D3 50 MCG (2000 UT) Tabs Take 2,000 Units by mouth daily.        Follow-up  Information     [facility]. Go on 03/20/2023.   Why: Please arrive by 2:30 pm in order to have PA/LAT CXR taken PRIOR to office appointment with Dr. Joneen Roach information: [address] [place] [address]        [name], MD. Go on 03/20/2023.   Specialty: Cardiothoracic Surgery Why: Appointment time is at 3:45 pm Contact information: [address] [place] [address] [phone]                 Signed:  Ardelle Balls, Cordelia Poche 03/07/2023, 2:28 PM

## 2023-03-03 NOTE — Anesthesia Procedure Notes (Signed)
Procedure Name: Intubation Date/Time: 03/03/2023 8:03 AM  Performed by: Kayleen Memos, CRNAPre-anesthesia Checklist: Patient identified, Emergency Drugs available, Suction available and Patient being monitored Patient Re-evaluated:Patient Re-evaluated prior to induction Oxygen Delivery Method: Circle System Utilized Preoxygenation: Pre-oxygenation with 100% oxygen Induction Type: IV induction Ventilation: Mask ventilation without difficulty Laryngoscope Size: Mac and 3 Grade View: Grade I Tube type: Oral Endobronchial tube: EBT position confirmed by auscultation, EBT position confirmed by fiberoptic bronchoscope, Double lumen EBT and Left and 37 Fr Number of attempts: 1 Airway Equipment and Method: Stylet and Oral airway Placement Confirmation: ETT inserted through vocal cords under direct vision, positive ETCO2 and breath sounds checked- equal and bilateral Secured at: 31 cm Tube secured with: Tape Dental Injury: Teeth and Oropharynx as per pre-operative assessment

## 2023-03-03 NOTE — Progress Notes (Signed)
Patient's chest tube site dressing was saturated approx. 60% with serosanguinous drainage, so the dressing has been reinforced without removing the petroleum gauze.  And education given regarding intensive spirometry and teach back. patient was not willing to use because of the chest pain, pain management done as per the order.

## 2023-03-03 NOTE — Anesthesia Procedure Notes (Signed)
Central Venous Catheter Insertion Performed by: Marcene Duos, MD, anesthesiologist Start/End11/07/2022 7:25 AM, 03/03/2023 7:35 AM Patient location: Pre-op. Preanesthetic checklist: patient identified, IV checked, site marked, risks and benefits discussed, surgical consent, monitors and equipment checked, pre-op evaluation, timeout performed and anesthesia consent Position: Trendelenburg Lidocaine 1% used for infiltration and patient sedated Hand hygiene performed , maximum sterile barriers used  and Seldinger technique used Catheter size: 8 Fr Total catheter length 16. Central line was placed.Double lumen Procedure performed using ultrasound guided technique. Ultrasound Notes:anatomy identified, needle tip was noted to be adjacent to the nerve/plexus identified, no ultrasound evidence of intravascular and/or intraneural injection and image(s) printed for medical record Attempts: 1 Following insertion, dressing applied, line sutured and Biopatch. Post procedure assessment: blood return through all ports  Patient tolerated the procedure well with no immediate complications.

## 2023-03-03 NOTE — Brief Op Note (Addendum)
03/03/2023  11:22 AM  PATIENT:  Angel [name]  72 y.o. male  PRE-OPERATIVE DIAGNOSIS:  ANTERIOR MEDIASTINAL MASS  POST-OPERATIVE DIAGNOSIS:  ANTERIOR MEDIASTINAL MASS  PROCEDURE:   XI ROBOTIC ASSISTED LEFT VATS,  THYMECTOMY  SURGEON:  Surgeons and Role:    [name], MD - Primary  PHYSICIAN ASSISTANT: Doree Fudge PA-C  ANESTHESIA:   general  EBL:  100 mL   DRAINS:  28 Blake drain placed in the left pleural space    LOCAL MEDICATIONS USED:  OTHER Exparel  SPECIMEN:  Source of Specimen:  Thymus  DISPOSITION OF SPECIMEN:  PATHOLOGY  COUNTS CORRECT:  YES  DICTATION: .Dragon Dictation  PLAN OF CARE: Admit to inpatient   PATIENT DISPOSITION:  PACU - hemodynamically stable.   Delay start of Pharmacological VTE agent (>24hrs) due to surgical blood loss or risk of bleeding: no

## 2023-03-04 ENCOUNTER — Inpatient Hospital Stay (HOSPITAL_COMMUNITY): Payer: Medicare HMO

## 2023-03-04 LAB — CBC
HCT: 24.2 % — ABNORMAL LOW (ref 39.0–52.0)
Hemoglobin: 7.5 g/dL — ABNORMAL LOW (ref 13.0–17.0)
MCH: 23.7 pg — ABNORMAL LOW (ref 26.0–34.0)
MCHC: 31 g/dL (ref 30.0–36.0)
MCV: 76.3 fL — ABNORMAL LOW (ref 80.0–100.0)
Platelets: 306 10*3/uL (ref 150–400)
RBC: 3.17 MIL/uL — ABNORMAL LOW (ref 4.22–5.81)
RDW: 16.7 % — ABNORMAL HIGH (ref 11.5–15.5)
WBC: 10.3 10*3/uL (ref 4.0–10.5)
nRBC: 0.2 % (ref 0.0–0.2)

## 2023-03-04 LAB — BASIC METABOLIC PANEL
Anion gap: 8 (ref 5–15)
BUN: 16 mg/dL (ref 8–23)
CO2: 22 mmol/L (ref 22–32)
Calcium: 8.1 mg/dL — ABNORMAL LOW (ref 8.9–10.3)
Chloride: 110 mmol/L (ref 98–111)
Creatinine, Ser: 0.92 mg/dL (ref 0.61–1.24)
GFR, Estimated: >60 mL/min (ref >60)
Glucose, Bld: 123 mg/dL — ABNORMAL HIGH (ref 70–99)
Potassium: 3.6 mmol/L (ref 3.5–5.1)
Sodium: 140 mmol/L (ref 135–145)

## 2023-03-04 MED ORDER — LOSARTAN POTASSIUM 25 MG PO TABS
25.0000 mg | ORAL_TABLET | Freq: Every day | ORAL | Status: DC
  Administered 2023-03-04 – 2023-03-07 (×4): 25 mg via ORAL
  Filled 2023-03-04 (×4): qty 1

## 2023-03-04 MED ORDER — FENTANYL CITRATE PF 50 MCG/ML IJ SOSY
25.0000 ug | PREFILLED_SYRINGE | INTRAMUSCULAR | Status: DC | PRN
  Administered 2023-03-04 – 2023-03-05 (×2): 50 ug via INTRAVENOUS
  Filled 2023-03-04 (×2): qty 1

## 2023-03-04 MED ORDER — GABAPENTIN 300 MG PO CAPS: 300.0000 mg | ORAL_CAPSULE | Freq: Two times a day (BID) | ORAL | Status: DC

## 2023-03-04 MED ORDER — POTASSIUM CHLORIDE CRYS ER 20 MEQ PO TBCR
20.0000 meq | EXTENDED_RELEASE_TABLET | ORAL | Status: DC
  Administered 2023-03-04: 20 meq via ORAL
  Filled 2023-03-04: qty 1

## 2023-03-04 MED ORDER — GABAPENTIN 300 MG PO CAPS
300.0000 mg | ORAL_CAPSULE | Freq: Two times a day (BID) | ORAL | Status: DC
  Administered 2023-03-04 – 2023-03-07 (×7): 300 mg via ORAL
  Filled 2023-03-04 (×7): qty 1

## 2023-03-04 MED ORDER — HYDRALAZINE HCL 20 MG/ML IJ SOLN
10.0000 mg | Freq: Four times a day (QID) | INTRAMUSCULAR | Status: DC | PRN
  Administered 2023-03-04 (×2): 10 mg via INTRAVENOUS
  Filled 2023-03-04 (×2): qty 1

## 2023-03-04 MED ORDER — METOCLOPRAMIDE HCL 5 MG/ML IJ SOLN
10.0000 mg | Freq: Four times a day (QID) | INTRAMUSCULAR | Status: AC
  Administered 2023-03-04 – 2023-03-05 (×4): 10 mg via INTRAVENOUS
  Filled 2023-03-04 (×5): qty 2

## 2023-03-04 MED ORDER — HYDRALAZINE HCL 20 MG/ML IJ SOLN
10.0000 mg | INTRAMUSCULAR | Status: AC
  Administered 2023-03-04: 10 mg via INTRAVENOUS
  Filled 2023-03-04: qty 1

## 2023-03-04 MED ORDER — POTASSIUM CHLORIDE CRYS ER 20 MEQ PO TBCR: 40.0000 meq | EXTENDED_RELEASE_TABLET | Freq: Once | ORAL | Status: DC

## 2023-03-04 MED ORDER — OXYCODONE HCL 5 MG PO TABS: 5.0000 mg | ORAL_TABLET | ORAL | Status: DC | PRN

## 2023-03-04 MED ORDER — LEVALBUTEROL HCL 0.63 MG/3ML IN NEBU
0.6300 mg | INHALATION_SOLUTION | Freq: Three times a day (TID) | RESPIRATORY_TRACT | Status: DC | PRN
  Administered 2023-03-04: 0.63 mg via RESPIRATORY_TRACT
  Filled 2023-03-04: qty 3

## 2023-03-04 MED ORDER — OXYCODONE HCL 5 MG PO TABS
10.0000 mg | ORAL_TABLET | ORAL | Status: DC | PRN
  Administered 2023-03-04 – 2023-03-07 (×9): 10 mg via ORAL
  Filled 2023-03-04 (×9): qty 2

## 2023-03-04 MED ORDER — METHOCARBAMOL 500 MG PO TABS
500.0000 mg | ORAL_TABLET | Freq: Three times a day (TID) | ORAL | Status: DC
  Administered 2023-03-04 – 2023-03-07 (×10): 500 mg via ORAL
  Filled 2023-03-04 (×10): qty 1

## 2023-03-04 NOTE — Progress Notes (Signed)
Patient's blood pressure was 170/87, patient denied headache, dizziness, blurred vision, PRN antihypertensive given as per the order.

## 2023-03-04 NOTE — Progress Notes (Signed)
Noted Moderate amount of serosanguineous saturated the chest tube dressing. Changed new chest tube dressing.   Lawson Radar, RN

## 2023-03-04 NOTE — Plan of Care (Signed)
  Problem: Education: Goal: Knowledge of disease or condition will improve Outcome: Progressing Goal: Knowledge of the prescribed therapeutic regimen will improve Outcome: Progressing   Problem: Cardiac: Goal: Will achieve and/or maintain hemodynamic stability Outcome: Progressing   Problem: Activity: Goal: Risk for activity intolerance will decrease Outcome: Progressing   Problem: Education: Goal: Knowledge of the prescribed therapeutic regimen will improve Outcome: Progressing   Problem: Pain Management: Goal: Pain level will decrease Outcome: Progressing   Problem: Respiratory: Goal: Respiratory status will improve Outcome: Progressing   Problem: Skin Integrity: Goal: Wound healing without signs and symptoms infection will improve Outcome: Progressing

## 2023-03-04 NOTE — Anesthesia Postprocedure Evaluation (Signed)
Anesthesia Post Note  Patient: Angel [name]  Procedure(s) Performed: XI ROBOTIC ASSISTED RESECTION OF ANTERIOR MEDIASTINAL MASS  WITH NERVE BLOCK (Left: Chest)     Patient location during evaluation: PACU Anesthesia Type: General Level of consciousness: awake and alert Pain management: pain level controlled Vital Signs Assessment: post-procedure vital signs reviewed and stable Respiratory status: spontaneous breathing, nonlabored ventilation, respiratory function stable and patient connected to nasal cannula oxygen Cardiovascular status: blood pressure returned to baseline and stable Postop Assessment: no apparent nausea or vomiting Anesthetic complications: no   No notable events documented.  Last Vitals:  Vitals:   03/04/23 1130 03/04/23 1211  BP: (!) 170/87 113/66  Pulse:  95  Resp:  20  Temp:    SpO2:      Last Pain:  Vitals:   03/04/23 1116  TempSrc: Oral  PainSc: 5                  Kennieth Rad

## 2023-03-04 NOTE — Progress Notes (Addendum)
Patient is alert and oriented *4, but he remained drowsy and was sleeping most of the day. In the early morning, mobilized him from bed to [facility] twice, tolerated fair, his blood pressure was high so PRN antihypertensive given twice. Patient is nauseated so doesn't have good appetite. Pain management done as per the pain assessment.

## 2023-03-04 NOTE — Progress Notes (Signed)
D/c central lines and A line per order. Pt tolerated well.   Lawson Radar, RN

## 2023-03-04 NOTE — Progress Notes (Signed)
   03/04/23 1600  Spiritual Encounters  Type of Visit Initial  Care provided to: Patient  Referral source Nurse (RN/NT/LPN)  Reason for visit Advance directives  OnCall Visit No  Advance Directives (For Healthcare)  Does Patient Have a Medical Advance Directive? No  Would patient like information on creating a medical advance directive? Yes (Inpatient - patient requests chaplain consult to create a medical advance directive)   Chaplain spoke with patient about advance directives. Patient appeared sleepy. Chaplain left paperwork and stated that a chaplain could return on a different day to provide further education.   Arlyce Dice, Chaplain Resident [phone]

## 2023-03-04 NOTE — Progress Notes (Addendum)
[address]       [place] [address]             [phone]       1 Day Post-Op Procedure(s) (LRB): XI ROBOTIC ASSISTED RESECTION OF ANTERIOR MEDIASTINAL MASS  WITH NERVE BLOCK (Left)  Subjective: Patient sitting in chair this am. He is having a lot of pain on left side (wounds, chest tube). He had nausea earlier.  Objective: Vital signs in last 24 hours: Temp:  [97.1 F (36.2 C)-98.3 F (36.8 C)] 98.1 F (36.7 C) (11/05 0336) Pulse Rate:  [60-87] 77 (11/05 0700) Cardiac Rhythm: Normal sinus rhythm (11/04 2000) Resp:  [17-27] 23 (11/05 0700) BP: (125-171)/(66-95) 171/88 (11/05 0700) SpO2:  [90 %-100 %] 96 % (11/05 0336) Arterial Line BP: (127-198)/(57-83) 186/72 (11/05 0700)     Intake/Output from previous day: 11/04 0701 - 11/05 0700 In: 3894.7 [P.O.:100; I.V.:2489.6; Blood:315; IV Piggyback:950.1] Out: 835 [Urine:615; Blood:100; Chest Tube:120]   Physical Exam:  Cardiovascular: RRR Pulmonary: Clear to auscultation on right and rub with chest tube in place on left Abdomen: Soft, non tender, bowel sounds present. Extremities: SCDs in place Wounds: Clean and dry.  No erythema or signs of infection. Chest Tube: to water seal, no air leak (tidling with cough)  Lab Results: CBC: Recent Labs    03/03/23 0625 03/03/23 1002 03/04/23 0335  WBC 5.6  --  10.3  HGB 7.6* 9.5* 7.5*  HCT 26.2* 28.0* 24.2*  PLT 406*  --  306   BMET:  Recent Labs    03/03/23 1002 03/04/23 0335  NA 140 140  K 3.9 3.6  CL  --  110  CO2  --  22  GLUCOSE  --  123*  BUN  --  16  CREATININE  --  0.92  CALCIUM  --  8.1*    PT/INR: No results for input(s): "LABPROT", "INR" in the last 72 hours. ABG:  INR: Will add last result for INR, ABG once components are confirmed Will add last 4 CBG results once components are confirmed  Assessment/Plan:  1. CV - SR. Hypertensive. On Losartan 25 every evening as taken prior to surgery. Will give Hydralazine PRN as  well. He was hypertensive during surgery yesterday. Some of this now is likely related to uncontrolled pain. May need to increase Losartan to 50 mg and monitor. 2.  Pulmonary - Chest tube with 120 cc recorded since surgery;however, almost 300 cc is in Pleura Vac. He is draining around chest tube.  Chest tube is to water seal, no air leak. CXR this am appears stable (patient rotated to the right, left rib fracture, no pneumothorax). Check CXR Encourage incentive spirometer. Await final pathology result 3. On Lovenox for DVT prophylaxis 4. Regarding pain medication, adjustments made-Neurontin 300 mg bid, Oxy Q 4 hours PRN, Toradol 15-hesitant to increase secondary to age (creatinine is WNL), Robaxin 500 mg daily (taken prior to surgery), Fentanyl IV Q 2 hours PRN 5. Remove a line, stop IVF 6. Supplement potassium 7. Expected post op blood loss anemia-H and H this am decreased to 7.5 and 24.2 (H and H prior to surgery 7.9 and 27.3). Monitor  Donielle M ZimmermanPA-C 03/04/2023,7:21 AM  Patient seen and examined, agree with above except Anemia predated admission, NOT due to blood loss CXR shows a rib fracture and elevated left hemidiaphragm- neither surprising in this setting Unclear what happened with pain meds- plan as outlined above, add gabapentin Ambulate  Salvatore Decent Dorris Fetch, MD [facility] [phone]

## 2023-03-05 ENCOUNTER — Inpatient Hospital Stay (HOSPITAL_COMMUNITY): Payer: Medicare HMO

## 2023-03-05 LAB — COMPREHENSIVE METABOLIC PANEL
ALT: 14 U/L (ref 0–44)
AST: 28 U/L (ref 15–41)
Albumin: 2.9 g/dL — ABNORMAL LOW (ref 3.5–5.0)
Alkaline Phosphatase: 36 U/L — ABNORMAL LOW (ref 38–126)
Anion gap: 7 (ref 5–15)
BUN: 26 mg/dL — ABNORMAL HIGH (ref 8–23)
CO2: 23 mmol/L (ref 22–32)
Calcium: 8.1 mg/dL — ABNORMAL LOW (ref 8.9–10.3)
Chloride: 109 mmol/L (ref 98–111)
Creatinine, Ser: 0.98 mg/dL (ref 0.61–1.24)
GFR, Estimated: >60 mL/min (ref >60)
Glucose, Bld: 101 mg/dL — ABNORMAL HIGH (ref 70–99)
Potassium: 3.6 mmol/L (ref 3.5–5.1)
Sodium: 139 mmol/L (ref 135–145)
Total Bilirubin: 0.5 mg/dL (ref <1.2)
Total Protein: 5 g/dL — ABNORMAL LOW (ref 6.5–8.1)

## 2023-03-05 LAB — CBC
HCT: 26.6 % — ABNORMAL LOW (ref 39.0–52.0)
Hemoglobin: 7.8 g/dL — ABNORMAL LOW (ref 13.0–17.0)
MCH: 23 pg — ABNORMAL LOW (ref 26.0–34.0)
MCHC: 29.3 g/dL — ABNORMAL LOW (ref 30.0–36.0)
MCV: 78.5 fL — ABNORMAL LOW (ref 80.0–100.0)
Platelets: 340 10*3/uL (ref 150–400)
RBC: 3.39 MIL/uL — ABNORMAL LOW (ref 4.22–5.81)
RDW: 17.1 % — ABNORMAL HIGH (ref 11.5–15.5)
WBC: 11.5 10*3/uL — ABNORMAL HIGH (ref 4.0–10.5)
nRBC: 0.2 % (ref 0.0–0.2)

## 2023-03-05 LAB — MAGNESIUM: Magnesium: 1.8 mg/dL (ref 1.7–2.4)

## 2023-03-05 MED ORDER — AMIODARONE HCL IN DEXTROSE 360-4.14 MG/200ML-% IV SOLN
30.0000 mg/h | INTRAVENOUS | Status: AC
  Administered 2023-03-05 – 2023-03-06 (×2): 30 mg/h via INTRAVENOUS
  Filled 2023-03-05 (×2): qty 200

## 2023-03-05 MED ORDER — METOPROLOL TARTRATE 5 MG/5ML IV SOLN
2.5000 mg | INTRAVENOUS | Status: AC
  Administered 2023-03-05: 2.5 mg via INTRAVENOUS
  Filled 2023-03-05: qty 5

## 2023-03-05 MED ORDER — ALUM & MAG HYDROXIDE-SIMETH 200-200-20 MG/5ML PO SUSP
15.0000 mL | Freq: Four times a day (QID) | ORAL | Status: DC | PRN
  Administered 2023-03-05: 15 mL via ORAL
  Filled 2023-03-05: qty 30

## 2023-03-05 MED ORDER — AMIODARONE LOAD VIA INFUSION
150.0000 mg | INTRAVENOUS | Status: AC
  Administered 2023-03-05: 150 mg via INTRAVENOUS
  Filled 2023-03-05: qty 83.34

## 2023-03-05 MED ORDER — METOPROLOL TARTRATE 5 MG/5ML IV SOLN
2.5000 mg | Freq: Once | INTRAVENOUS | Status: AC
  Administered 2023-03-05: 2.5 mg via INTRAVENOUS
  Filled 2023-03-05: qty 5

## 2023-03-05 MED ORDER — AMIODARONE LOAD VIA INFUSION
150.0000 mg | Freq: Once | INTRAVENOUS | Status: AC
  Administered 2023-03-05: 150 mg via INTRAVENOUS
  Filled 2023-03-05: qty 83.34

## 2023-03-05 MED ORDER — AMIODARONE HCL IN DEXTROSE 360-4.14 MG/200ML-% IV SOLN
60.0000 mg/h | INTRAVENOUS | Status: AC
  Administered 2023-03-05 (×2): 60 mg/h via INTRAVENOUS
  Filled 2023-03-05 (×2): qty 200

## 2023-03-05 MED ORDER — POTASSIUM CHLORIDE CRYS ER 20 MEQ PO TBCR
40.0000 meq | EXTENDED_RELEASE_TABLET | Freq: Once | ORAL | Status: AC
  Administered 2023-03-05: 40 meq via ORAL
  Filled 2023-03-05: qty 2

## 2023-03-05 NOTE — Progress Notes (Addendum)
Mobility Specialist Progress Note:   03/05/23 1454  Mobility  Activity Ambulated with assistance in hallway  Level of Assistance Contact guard assist, steadying assist  Assistive Device Front wheel walker  Distance Ambulated (ft) 150 ft  Activity Response Tolerated well  Mobility Referral Yes  $Mobility charge 1 Mobility  Mobility Specialist Start Time (ACUTE ONLY) 1420  Mobility Specialist Stop Time (ACUTE ONLY) 1440  Mobility Specialist Time Calculation (min) (ACUTE ONLY) 20 min   Pre Mobility: 99 HR ,  93% SpO2 RA During Mobility: 124 HR , 92% SpO2 RA Post Mobility: 93 HR   Pt received in bed, agreeable to mobility. SB bed mobility. CG during ambulation. Elevated HR during mobility with peak of 164 bpm. RN notified. Pt denied any discomfort during ambulation, asymptomatic throughout. After session pt c/o slight SOB. SpO2 >90%. Pt left in chair with VSS and all needs met. RN present in room.   Leory Plowman  Mobility Specialist Please contact via [employer] office at [phone]

## 2023-03-05 NOTE — Progress Notes (Signed)
Pt's HR running at 150s-170s constantly. Pt asymptomatic. Page PA on call. Received verbal order for amio iv 150 mg bolus and metoprolol 2.5 mg iv once.  Lawson Radar, RN

## 2023-03-05 NOTE — Progress Notes (Addendum)
      [address]       [place] [address]             [phone]       2 Days Post-Op Procedure(s) (LRB): XI ROBOTIC ASSISTED RESECTION OF ANTERIOR MEDIASTINAL MASS  WITH NERVE BLOCK (Left)  Subjective: Patient walked yesterday. His pain is under better control.  Objective: Vital signs in last 24 hours: Temp:  [97.3 F (36.3 C)-98.1 F (36.7 C)] 97.8 F (36.6 C) (11/06 0343) Pulse Rate:  [77-101] 90 (11/05 2343) Cardiac Rhythm: Sinus tachycardia (11/05 1930) Resp:  [19-25] 20 (11/05 1837) BP: (113-171)/(66-92) 127/84 (11/06 0343) SpO2:  [91 %-97 %] 91 % (11/05 1800) Arterial Line BP: (186)/(72) 186/72 (11/05 0700)     Intake/Output from previous day: 11/05 0701 - 11/06 0700 In: 90 [P.O.:90] Out: 160 [Chest Tube:160]   Physical Exam:  Cardiovascular: Slightly tachycardic Pulmonary: Clear to auscultation on the right and coarse on the left Abdomen: Soft, non tender, bowel sounds present. Extremities: No lower extremity edema. Wounds: Clean and dry.  No erythema or signs of infection. Chest Tube: to water seal, had air leak with initial cough.   Lab Results: CBC: Recent Labs    03/04/23 0335 03/05/23 0321  WBC 10.3 11.5*  HGB 7.5* 7.8*  HCT 24.2* 26.6*  PLT 306 340   BMET:  Recent Labs    03/04/23 0335 03/05/23 0321  NA 140 139  K 3.6 3.6  CL 110 109  CO2 22 23  GLUCOSE 123* 101*  BUN 16 26*  CREATININE 0.92 0.98  CALCIUM 8.1* 8.1*    PT/INR: No results for input(s): "LABPROT", "INR" in the last 72 hours. ABG:  INR: Will add last result for INR, ABG once components are confirmed Will add last 4 CBG results once components are confirmed  Assessment/Plan: 1. CV - SR. On Losartan 25 every evening as taken prior to surgery.  2.  Pulmonary - ON 1-2 liters of oxygen via [place]. Wean as able. Chest tube with 160 cc last 24 hours.  Chest tube is to water seal, CXR this am appears stable-elevated left hemi diaphragm (phrenic nerve  intact). Check CXR in am. Encourage incentive spirometer. Await final pathology result 3. On Lovenox for DVT prophylaxis 4. History of hypothyroidism-continue Levothyroxine 88 mcg daily 5. Supplement potassium 6. Anemia-H and H this am slightly increased to 7.8 and 26.6 (H and H prior to surgery 7.9 and 27.3). Monitor   Donielle M ZimmermanPA-C 03/05/2023,6:55 AM   Patient seen and examined, agree with  above In A fib RVR this AM, now back in Sr No air leak- dc chest tube  Viviann Spare C. Dorris Fetch, MD [facility] [phone]

## 2023-03-05 NOTE — Progress Notes (Signed)
D/c  chest tube per order. Pt tolerated well. Vs stable.   Lawson Radar, RN

## 2023-03-05 NOTE — Progress Notes (Signed)
Pt's HR running 120s-170s. Pt asymptomatic. Obtained EKG. PA notified. New orders received.   Lawson Radar, RN

## 2023-03-06 ENCOUNTER — Inpatient Hospital Stay (HOSPITAL_COMMUNITY): Payer: Medicare HMO

## 2023-03-06 LAB — BASIC METABOLIC PANEL
Anion gap: 4 — ABNORMAL LOW (ref 5–15)
BUN: 22 mg/dL (ref 8–23)
CO2: 25 mmol/L (ref 22–32)
Calcium: 8.3 mg/dL — ABNORMAL LOW (ref 8.9–10.3)
Chloride: 110 mmol/L (ref 98–111)
Creatinine, Ser: 1 mg/dL (ref 0.61–1.24)
GFR, Estimated: >60 mL/min (ref >60)
Glucose, Bld: 107 mg/dL — ABNORMAL HIGH (ref 70–99)
Potassium: 4.5 mmol/L (ref 3.5–5.1)
Sodium: 139 mmol/L (ref 135–145)

## 2023-03-06 MED ORDER — AMIODARONE HCL 200 MG PO TABS
400.0000 mg | ORAL_TABLET | Freq: Two times a day (BID) | ORAL | Status: DC
  Administered 2023-03-06 – 2023-03-07 (×3): 400 mg via ORAL
  Filled 2023-03-06 (×3): qty 2

## 2023-03-06 MED ORDER — CLOPIDOGREL BISULFATE 75 MG PO TABS
75.0000 mg | ORAL_TABLET | Freq: Every day | ORAL | Status: DC
  Administered 2023-03-06 – 2023-03-07 (×2): 75 mg via ORAL
  Filled 2023-03-06 (×2): qty 1

## 2023-03-06 MED ORDER — LACTULOSE 10 GM/15ML PO SOLN
30.0000 g | Freq: Once | ORAL | Status: AC
  Administered 2023-03-06: 30 g via ORAL
  Filled 2023-03-06: qty 45

## 2023-03-06 NOTE — Care Management Important Message (Signed)
Important Message  Patient Details  Name: Angel [name] MRN: [id number] Date of Birth: 03-Jun-1950   Important Message Given:  Yes - Medicare IM     Dorena Bodo 03/06/2023, 2:51 PM

## 2023-03-06 NOTE — Progress Notes (Signed)
Mobility Specialist Progress Note:   03/06/23 1033  Mobility  Activity Ambulated with assistance in hallway  Level of Assistance Contact guard assist, steadying assist  Assistive Device Front wheel walker  Distance Ambulated (ft) 225 ft  Activity Response Tolerated well  Mobility Referral Yes  $Mobility charge 1 Mobility  Mobility Specialist Start Time (ACUTE ONLY) 0925  Mobility Specialist Stop Time (ACUTE ONLY) [id number]  Mobility Specialist Time Calculation (min) (ACUTE ONLY) 12 min   Pre Mobility: 111 HR ,  92% SpO2 RA During Mobility: 118 HR , 90%-92% SpO2 RA Post Mobility: 109 HR   Pt received in bed, slightly irritable but agreeable to mobility. Pt denied any discomfort during ambulation, asymptomatic throughout. Pt returned to bed with call bell in reach and all needs met.  Leory Plowman  Mobility Specialist Please contact via [employer] office at [phone]

## 2023-03-06 NOTE — Evaluation (Signed)
Occupational Therapy Evaluation Patient Details Name: Angel [name] MRN: [id number] DOB: 1951/01/12 Today's Date: 03/06/2023   History of Present Illness Pt is a 72 y.o. male admitted 11/4 with c/o sharp left-sided chest pain. Pt diagnosed with Left anterior mediastinal mass and underwent XI robotic-assisted resection of mass with nerve block on 11/4. PMH: squamous cell carcinoma of the base of the tongue, tobacco abuse, marijuana use, lymphedema, PAD, syncope, back surgery, inguinal and umbilical hernia repair in 2023   Clinical Impression   At baseline, pt is Independent with ADLs, IADLs, and functional mobility without an AD and drives. Pt now presents with decreased activity tolerance, decreased B shoulder strength, decreased balance during functional tasks, and decreased safety and independence with functional tasks. Pt currently demonstrates ability to complete ADLs Independent to close Supervision and functional transfers/mobility with a RW with close Supervision for safety. Pt participated well in session. Pt's HR in the mid-90s to mid-120s during session and O2 sat >/93% on RA throughout session. Pt will benefit from acute skilled OT services to address deficits outlined below and increase safety and independence with functional tasks. Post acute discharge, no OT follow up is indicated at this time.       If plan is discharge home, recommend the following: A little help with walking and/or transfers;A little help with bathing/dressing/bathroom;Assistance with cooking/housework;Assist for transportation;Help with stairs or ramp for entrance    Functional Status Assessment  Patient has had a recent decline in their functional status and demonstrates the ability to make significant improvements in function in a reasonable and predictable amount of time.  Equipment Recommendations  None recommended by OT (Pt already has all needed equipment)    Recommendations for Other Services        Precautions / Restrictions Precautions Precautions: Fall Restrictions Weight Bearing Restrictions: No      Mobility Bed Mobility Overal bed mobility: Needs Assistance Bed Mobility: Supine to Sit     Supine to sit: Supervision, HOB elevated     General bed mobility comments: Supervision for safety    Transfers Overall transfer level: Needs assistance Equipment used: Rolling walker (2 wheels) Transfers: Sit to/from Stand, Bed to chair/wheelchair/BSC Sit to Stand: Supervision     Step pivot transfers: Supervision (with use of RW)     General transfer comment: close Supervision for safety      Balance Overall balance assessment: Needs assistance Sitting-balance support: No upper extremity supported, Feet supported Sitting balance-Leahy Scale: Good     Standing balance support: No upper extremity supported, Single extremity supported, Bilateral upper extremity supported, During functional activity Standing balance-Leahy Scale: Fair Standing balance comment: Able to maintain static stand without UE support; pt requiring UE support in dynamic standing and functional mobility                           ADL either performed or assessed with clinical judgement   ADL Overall ADL's : Needs assistance/impaired Eating/Feeding: Independent;Sitting   Grooming: Supervision/safety;Standing   Upper Body Bathing: Supervision/ safety;Sitting   Lower Body Bathing: Supervison/ safety;Sitting/lateral leans;Sit to/from stand   Upper Body Dressing : Modified independent;Sitting   Lower Body Dressing: Supervision/safety;Sitting/lateral leans;Sit to/from stand   Toilet Transfer: Supervision/safety;Regular Toilet;Grab bars;Ambulation;Rolling walker (2 wheels)   Toileting- Clothing Manipulation and Hygiene: Supervision/safety;Sitting/lateral lean;Sit to/from stand       Functional mobility during ADLs: Supervision/safety;Rolling walker (2 wheels) General ADL Comments: Pt  with decreased activity tolerance during functional tasks.  Vision Baseline Vision/History: 1 Wears glasses;4 Cataracts (bifocals; pt reports during last eye exam he was told he has very mild cataracts that do not require intervention at this time) Ability to See in Adequate Light: 0 Adequate Patient Visual Report: No change from baseline       Perception         Praxis         Pertinent Vitals/Pain Pain Assessment Pain Assessment: No/denies pain     Extremity/Trunk Assessment Upper Extremity Assessment Upper Extremity Assessment: Right hand dominant;RUE deficits/detail;LUE deficits/detail RUE Deficits / Details: 4/5 strength in shoulder; all other strength, ROM, and coordination WNL LUE Deficits / Details: 4/5 strength in shoulder; all other strength, ROM, and coordination WNL   Lower Extremity Assessment Lower Extremity Assessment: Defer to PT evaluation   Cervical / Trunk Assessment Cervical / Trunk Assessment: Kyphotic (Hx of back surgery)   Communication Communication Communication: No apparent difficulties   Cognition Arousal: Alert Behavior During Therapy: WFL for tasks assessed/performed Overall Cognitive Status: Within Functional Limits for tasks assessed                                 General Comments: AAOx4 and pleasant throughout session. Demonstrates ability to follow multi-step commands consistently.     General Comments  HR in the mid- 90s to mid-120s during session and O2 sat >/93% on RA throughout session. Pt's sister present throughout session and PT present at end of session.    Exercises     Shoulder Instructions      Home Living Family/patient expects to be discharged to:: Private residence Living Arrangements: Spouse/significant other Available Help at Discharge: Family;Friend(s);Available PRN/intermittently Type of Home: House Home Access: Stairs to enter [employer] of Steps: 2 up to porch and 1 into  house Entrance Stairs-Rails: Right (wall on L) Home Layout: One level     Bathroom Shower/Tub: Chief Strategy Officer: Standard Bathroom Accessibility: Yes How Accessible: Accessible via walker Home Equipment: Cane - single point;Wheelchair - Animal nutritionist (4 wheels);Shower seat;BSC/3in1;Hand held shower head          Prior Functioning/Environment Prior Level of Function : Independent/Modified Independent             Mobility Comments: Independent with AD ADLs Comments: Independent with ADLs, IADLs and drives.        OT Problem List: Decreased strength;Decreased activity tolerance;Impaired balance (sitting and/or standing);Decreased knowledge of use of DME or AE      OT Treatment/Interventions: Self-care/ADL training;Therapeutic exercise;Energy conservation;DME and/or AE instruction;Patient/family education    OT Goals(Current goals can be found in the care plan section) Acute Rehab OT Goals Patient Stated Goal: to return home OT Goal Formulation: With patient Time For Goal Achievement: 03/20/23 Potential to Achieve Goals: Good ADL Goals Pt Will Perform Grooming: Independently;standing Pt Will Perform Lower Body Bathing: with modified independence;sit to/from stand Pt Will Perform Lower Body Dressing: Independently;sit to/from stand;sitting/lateral leans Pt Will Transfer to Toilet: Independently;ambulating;regular height toilet (with least restrictive AD) Pt Will Perform Toileting - Clothing Manipulation and hygiene: Independently;sit to/from stand;sitting/lateral leans Pt/caregiver will Perform Home Exercise Program: Increased strength;With written HEP provided;With Supervision (Bilateral shoulders; AROM progressing to theraband as tolerated) Additional ADL Goal #1: Patient will Indepenedntly state 4 energy conservation strategies to increase safety and independence with functional tasks.  OT Frequency: Min 1X/week    Co-evaluation  AM-PAC OT "6 Clicks" Daily Activity     Outcome Measure Help from another person eating meals?: None Help from another person taking care of personal grooming?: A Little Help from another person toileting, which includes using toliet, bedpan, or urinal?: A Little Help from another person bathing (including washing, rinsing, drying)?: A Little Help from another person to put on and taking off regular upper body clothing?: None Help from another person to put on and taking off regular lower body clothing?: A Little 6 Click Score: 20   End of Session Equipment Utilized During Treatment: Gait belt;Rolling walker (2 wheels) Nurse Communication: Mobility status  Activity Tolerance: Patient tolerated treatment well Patient left: Other (comment) (Ambulating with PT)  OT Visit Diagnosis: Unsteadiness on feet (R26.81);Other (comment) (decreased activity tolerance)                Time: 1610-9604 OT Time Calculation (min): 21 min Charges:  OT General Charges $OT Visit: 1 Visit OT Evaluation $OT Eval Low Complexity: 1 Low  [address] M., OTR/L, MA Acute Rehab [phone]   Lendon Colonel 03/06/2023, 4:30 PM

## 2023-03-06 NOTE — Evaluation (Signed)
Physical Therapy Evaluation Patient Details Name: Angel [name] MRN: [id number] DOB: 1950/05/13 Today's Date: 03/06/2023  History of Present Illness  Pt is a 72 y.o. male admitted 11/4 with c/o sharp left-sided chest pain. Pt diagnosed with Left anterior mediastinal mass and underwent XI robotic-assisted resection of mass with nerve block on 11/4. PMH: squamous cell carcinoma of the base of the tongue, tobacco abuse, marijuana use, lymphedema, PAD, syncope, back surgery, inguinal and umbilical hernia repair in 2023  Clinical Impression  Patient presents with decreased mobility post surgery and bedrest.  Able to ambulate with RW and S and negotiate steps appropriate for home entry without issues.  Feel patient stable and able to return home with family support and no further skilled PT needs.  Discussed activity progression and energy conservation.  PT will sign off.         If plan is discharge home, recommend the following: Assist for transportation;Help with stairs or ramp for entrance;Assistance with cooking/housework   Can travel by private vehicle        Equipment Recommendations None recommended by PT  Recommendations for Other Services       Functional Status Assessment Patient has had a recent decline in their functional status and demonstrates the ability to make significant improvements in function in a reasonable and predictable amount of time.     Precautions / Restrictions Precautions Precautions: Sternal;Fall Restrictions Weight Bearing Restrictions: No      Mobility  Bed Mobility   Bed Mobility: Sit to Supine       Sit to supine: Modified independent (Device/Increase time)        Transfers Overall transfer level: Needs assistance Equipment used: Rolling walker (2 wheels) Transfers: Sit to/from Stand Sit to Stand: Supervision           General transfer comment: handoff from OT for ambulation    Ambulation/Gait Ambulation/Gait assistance:  Supervision Gait Distance (Feet): 250 Feet Assistive device: Rolling walker (2 wheels) Gait Pattern/deviations: Step-through pattern, Decreased stride length       General Gait Details: managing RW well, uses rollator at home, mild dyspnea noted, but pt without c/o, HR max 120  Stairs Stairs: Yes Stairs assistance: Supervision Stair Management: One rail Left, Step to pattern, Forwards Number of Stairs: 2 General stair comments: self selected sequence and pattern, S for safety  Wheelchair Mobility     Tilt Bed    Modified Rankin (Stroke Patients Only)       Balance Overall balance assessment: Needs assistance   Sitting balance-Leahy Scale: Good     Standing balance support: No upper extremity supported Standing balance-Leahy Scale: Fair Standing balance comment: static standing no UE support, lifting walker over obstacle in hallway without LOB                             Pertinent Vitals/Pain Pain Assessment Pain Assessment: No/denies pain    Home Living Family/patient expects to be discharged to:: Private residence Living Arrangements: Spouse/significant other Available Help at Discharge: Family;Friend(s);Available PRN/intermittently Type of Home: House Home Access: Stairs to enter Entrance Stairs-Rails: Left (wall on R) Entrance Stairs-Number of Steps: 2 up to porch and 1 into house   Home Layout: One level Home Equipment: Cane - single point;Wheelchair - Animal nutritionist (4 wheels);Shower seat;BSC/3in1;Hand held shower head      Prior Function Prior Level of Function : Independent/Modified Independent  Mobility Comments: Independent with AD; enjoys fishing ADLs Comments: Independent with ADLs, IADLs and drives.     Extremity/Trunk Assessment   Upper Extremity Assessment Upper Extremity Assessment: Defer to OT evaluation RUE Deficits / Details: 4/5 strength in shoulder; all other strength, ROM, and  coordination WNL LUE Deficits / Details: 4/5 strength in shoulder; all other strength, ROM, and coordination WNL    Lower Extremity Assessment Lower Extremity Assessment: Overall WFL for tasks assessed    Cervical / Trunk Assessment Cervical / Trunk Assessment: Kyphotic  Communication   Communication Communication: No apparent difficulties  Cognition Arousal: Alert Behavior During Therapy: WFL for tasks assessed/performed Overall Cognitive Status: Within Functional Limits for tasks assessed                                          General Comments General comments (skin integrity, edema, etc.): HR 120 max with ambulation/stairs, SpO2 95%, BP stable; sister present and supportive    Exercises     Assessment/Plan    PT Assessment Patient does not need any further PT services  PT Problem List         PT Treatment Interventions      PT Goals (Current goals can be found in the Care Plan section)  Acute Rehab PT Goals PT Goal Formulation: All assessment and education complete, DC therapy    Frequency       Co-evaluation               AM-PAC PT "6 Clicks" Mobility  Outcome Measure Help needed turning from your back to your side while in a flat bed without using bedrails?: None Help needed moving from lying on your back to sitting on the side of a flat bed without using bedrails?: None Help needed moving to and from a bed to a chair (including a wheelchair)?: A Little Help needed standing up from a chair using your arms (e.g., wheelchair or bedside chair)?: None Help needed to walk in hospital room?: A Little Help needed climbing 3-5 steps with a railing? : A Little 6 Click Score: 21    End of Session Equipment Utilized During Treatment: Gait belt Activity Tolerance: Patient tolerated treatment well Patient left: in bed;with call bell/phone within reach   PT Visit Diagnosis: Muscle weakness (generalized) (M62.81)    Time: 1610-9604 PT Time  Calculation (min) (ACUTE ONLY): 14 min   Charges:   PT Evaluation $PT Eval Low Complexity: 1 Low   PT General Charges $$ ACUTE PT VISIT: 1 Visit         Sheran Lawless, PT Acute Rehabilitation Services [phone] 03/06/2023   Elray Mcgregor 03/06/2023, 4:59 PM

## 2023-03-06 NOTE — Discharge Instructions (Signed)
Robot-Assisted Thoracic Surgery, Care After The following information offers guidance on how to care for yourself after your procedure. Your health care provider may also give you more specific instructions. If you have problems or questions, contact your health care provider. What can I expect after the procedure? After the procedure, it is common to have: Some pain and aches in the area of your surgical incisions. Pain when breathing in (inhaling) and coughing. Tiredness (fatigue). Trouble sleeping. Constipation. Follow these instructions at home: Medicines Take over-the-counter and prescription medicines only as told by your health care provider. If you were prescribed an antibiotic medicine, take it as told by your health care provider. Do not stop taking the antibiotic even if you start to feel better. Talk with your health care provider about safe and effective ways to manage pain after your procedure. Pain management should fit your specific health needs. Take pain medicine before pain becomes severe. Relieving and controlling your pain will make breathing easier for you. Ask your health care provider if the medicine prescribed to you requires you to avoid driving or using machinery. Eating and drinking Follow instructions from your health care provider about eating or drinking restrictions. These will vary depending on what procedure you had. Your health care provider may recommend: A liquid diet or soft diet for the first few days. Meals that are smaller and more frequent. A diet of fruits, vegetables, whole grains, and low-fat proteins. Limiting foods that are high in fat and processed sugar, including fried or sweet foods. Incision care Follow instructions from your health care provider about how to take care of your incisions. Make sure you: Wash your hands with soap and water for at least 20 seconds before and after you change your bandage (dressing). If soap and water are not  available, use hand sanitizer. Change your dressing as told by your health care provider. Leave stitches (sutures), skin glue, or adhesive strips in place. These skin closures may need to stay in place for 2 weeks or longer. If adhesive strip edges start to loosen and curl up, you may trim the loose edges. Do not remove adhesive strips completely unless your health care provider tells you to do that. Check your incision area every day for signs of infection. Check for: Redness, swelling, or more pain. Fluid or blood. Warmth. Pus or a bad smell. Activity Return to your normal activities as told by your health care provider. Ask your health care provider what activities are safe for you. Ask your health care provider when it is safe for you to drive. Do not lift anything that is heavier than 10 lb (4.5 kg), or the limit that you are told, until your health care provider says that it is safe. Rest as told by your health care provider. Avoid sitting for a long time without moving. Get up to take short walks every 1-2 hours. This is important to improve blood flow and breathing. Ask for help if you feel weak or unsteady. Do exercises as told by your health care provider. Pneumonia prevention  Do deep breathing exercises and cough regularly as directed. This helps clear mucus and opens your lungs. Doing this helps prevent lung infection (pneumonia). If you were given an incentive spirometer, use it as told. An incentive spirometer is a tool that measures how well you are filling your lungs with each breath. Coughing may hurt less if you try to support your chest. This is called splinting. Try one of these when you  cough: Hold a pillow against your chest. Place the palms of both hands on top of your incision area. Do not use any products that contain nicotine or tobacco. These products include cigarettes, chewing tobacco, and vaping devices, such as e-cigarettes. If you need help quitting, ask your  health care provider. Avoid secondhand smoke. General instructions If you have a drainage tube: Follow instructions from your health care provider about how to take care of it. Do not travel by airplane after your tube is removed until your health care provider tells you it is safe. You may need to take these actions to prevent or treat constipation: Drink enough fluid to keep your urine pale yellow. Take over-the-counter or prescription medicines. Eat foods that are high in fiber, such as beans, whole grains, and fresh fruits and vegetables. Limit foods that are high in fat and processed sugars, such as fried or sweet foods. Keep all follow-up visits. This is important. Contact a health care provider if: You have redness, swelling, or more pain around an incision. You have fluid or blood coming from an incision. An incision feels warm to the touch. You have pus or a bad smell coming from an incision. You have a fever. You cannot eat or drink without vomiting. Your pain medicine is not controlling your pain. Get help right away if: You have chest pain. Your heart is beating quickly. You have trouble breathing. You have trouble speaking. You are confused. You feel weak or dizzy, or you faint. These symptoms may represent a serious problem that is an emergency. Do not wait to see if the symptoms will go away. Get medical help right away. Call your local emergency services (911 in the [place] Do not drive yourself to the hospital. Summary Talk with your health care provider about safe and effective ways to manage pain after your procedure. Pain management should fit your specific health needs. Return to your normal activities as told by your health care provider. Ask your health care provider what activities are safe for you. Do deep breathing exercises and cough regularly as directed. This helps to clear mucus and prevent pneumonia. If it hurts to cough, ease pain by holding a pillow  against your chest or by placing the palms of both hands over your incisions. This information is not intended to replace advice given to you by your health care provider. Make sure you discuss any questions you have with your health care provider. Document Revised: 01/06/2020 Document Reviewed: 01/07/2020 Elsevier Patient Education  2024 [employer].

## 2023-03-06 NOTE — Progress Notes (Addendum)
      [address]       [place] [address]             [phone]       3 Days Post-Op Procedure(s) (LRB): XI ROBOTIC ASSISTED RESECTION OF ANTERIOR MEDIASTINAL MASS  WITH NERVE BLOCK (Left)  Subjective: Patient on the commode this am. He is constipated and frustrated. The remote to call for help fell on the floor and no one checked on him for a long time.   Objective: Vital signs in last 24 hours: Temp:  [97.4 F (36.3 C)-98.7 F (37.1 C)] 98.7 F (37.1 C) (11/07 0323) Pulse Rate:  [80-106] 80 (11/07 0323) Cardiac Rhythm: Normal sinus rhythm (11/06 2013) Resp:  [16-20] 20 (11/07 0323) BP: (97-154)/(59-90) 106/59 (11/07 0323) SpO2:  [95 %-98 %] 96 % (11/07 0323)     Intake/Output from previous day: 11/06 0701 - 11/07 0700 In: 383.5 [P.O.:120; I.V.:263.5] Out: 360 [Urine:360]   Physical Exam:  Cardiovascular: RRR Pulmonary: Clear to auscultation on the right and slightly diminished left apex Abdomen: Soft, non tender, bowel sounds present. Extremities: No lower extremity edema. Wounds:Dressing clean, dry this am   Lab Results: CBC: Recent Labs    03/04/23 0335 03/05/23 0321  WBC 10.3 11.5*  HGB 7.5* 7.8*  HCT 24.2* 26.6*  PLT 306 340   BMET:  Recent Labs    03/05/23 0321 03/06/23 0410  NA 139 139  K 3.6 4.5  CL 109 110  CO2 23 25  GLUCOSE 101* 107*  BUN 26* 22  CREATININE 0.98 1.00  CALCIUM 8.1* 8.3*    PT/INR: No results for input(s): "LABPROT", "INR" in the last 72 hours. ABG:  INR: Will add last result for INR, ABG once components are confirmed Will add last 4 CBG results once components are confirmed  Assessment/Plan: 1. CV - He went into a fib with RVR yesterday. He was put on an Amiodarone drip, given IV Lopressor and IV Amiodarone bolus. He is SR this am. He is also on Losartan 25 every evening as taken prior to surgery. Will transition to oral Amiodarone. Will restart Plavix as taken prior to surgery 2.  Pulmonary  - On 1 iter of oxygen via [place]. Wean as able. Chest tube removed yesterday. CXR this am appears to show small left apical pneumothorax, subcutaneous emphysema left lateral chest wall, and elevated left hemi diaphragm (phrenic nerve intact). Check CXR in am. Encourage incentive spirometer. Await final pathology result 3. On Lovenox for DVT prophylaxis 4. History of hypothyroidism-continue Levothyroxine 88 mcg daily 6. Anemia-Last H and H slightly increased to 7.8 and 26.6 (H and H prior to surgery 7.9 and 27.3). Monitor 7. LOC constipation 8. Possible discharge in am if rhythm remains stable  Donielle M ZimmermanPA-C 03/06/2023,6:56 AM  Patient seen and examined, agree with above C/o constipation, denies incisional pain Paroxysmal atrial fibrillation- In Sr with PACs this AM Home tomorrow if BM and maintains SR  Keanan Melander C. Dorris Fetch, MD [facility] [phone]

## 2023-03-07 ENCOUNTER — Inpatient Hospital Stay (HOSPITAL_COMMUNITY): Payer: Medicare HMO

## 2023-03-07 LAB — BPAM RBC
Blood Product Expiration Date: 202411212359
Blood Product Expiration Date: 202411212359
ISSUE DATE / TIME: 202411040719
ISSUE DATE / TIME: 202411040719
Unit Type and Rh: 6200
Unit Type and Rh: 6200

## 2023-03-07 LAB — TYPE AND SCREEN
ABO/RH(D): A POS
Antibody Screen: NEGATIVE
Unit division: 0
Unit division: 0

## 2023-03-07 MED ORDER — AMIODARONE HCL 200 MG PO TABS: ORAL_TABLET | ORAL | 1 refills | Status: DC

## 2023-03-07 MED ORDER — OXYCODONE HCL 10 MG PO TABS: 10.0000 mg | ORAL_TABLET | Freq: Four times a day (QID) | ORAL | 0 refills | Status: DC | PRN

## 2023-03-07 MED ORDER — GABAPENTIN 300 MG PO CAPS: 300.0000 mg | ORAL_CAPSULE | Freq: Every day | ORAL | 0 refills | Status: DC

## 2023-03-07 NOTE — Progress Notes (Addendum)
      [address]       [place] [address]             [phone]       4 Days Post-Op Procedure(s) (LRB): XI ROBOTIC ASSISTED RESECTION OF ANTERIOR MEDIASTINAL MASS  WITH NERVE BLOCK (Left)  Subjective: Patient has moved his bowels "a lot". He feels much better. He would like to go home. Patient with some pain left side (wounds)  Objective: Vital signs in last 24 hours: Temp:  [97.6 F (36.4 C)-99.4 F (37.4 C)] 99.4 F (37.4 C) (11/08 0334) Pulse Rate:  [85-95] 90 (11/08 0334) Cardiac Rhythm: Normal sinus rhythm (11/07 1900) Resp:  [16-20] 20 (11/08 0334) BP: (111-155)/(63-87) 111/73 (11/08 0334) SpO2:  [92 %-98 %] 94 % (11/08 0334)     Intake/Output from previous day: No intake/output data recorded.   Physical Exam:  Cardiovascular: RRR Pulmonary: Mostly clear Abdomen: Soft, non tender, bowel sounds present. Extremities: No lower extremity edema. Wounds: Dressing removed. Wound is clean and dry.   Lab Results: CBC: Recent Labs    03/05/23 0321  WBC 11.5*  HGB 7.8*  HCT 26.6*  PLT 340   BMET:  Recent Labs    03/05/23 0321 03/06/23 0410  NA 139 139  K 3.6 4.5  CL 109 110  CO2 23 25  GLUCOSE 101* 107*  BUN 26* 22  CREATININE 0.98 1.00  CALCIUM 8.1* 8.3*    PT/INR: No results for input(s): "LABPROT", "INR" in the last 72 hours. ABG:  INR: Will add last result for INR, ABG once components are confirmed Will add last 4 CBG results once components are confirmed  Assessment/Plan: 1. CV - A fib with RVR 11/06. Maintaining SR. On Amiodarone 400 mg bid and Losartan 25 every evening as taken prior to surgery. Plavix restarted yesterday. 2.  Pulmonary - On room air. CXR ordered but not taken yet. Encourage incentive spirometer. Await final pathology result 3. On Lovenox for DVT prophylaxis 4. History of hypothyroidism-continue Levothyroxine 88 mcg daily 6. Anemia-Last H and H slightly increased to 7.8 and 26.6 (H and H prior to  surgery 7.9 and 27.3). Monitor 7. Hope to discharge later today.  Donielle M ZimmermanPA-C 03/07/2023,6:55 AM  Patient seen and examined, agree with findings and plan outlined above CXR stable Home later today Path still pending  Viviann Spare C. Dorris Fetch, MD [facility] [phone]

## 2023-03-07 NOTE — Progress Notes (Signed)
This encounter was created in error - please disregard.

## 2023-03-07 NOTE — Progress Notes (Signed)
Occupational Therapy Treatment Patient Details Name: OLTON PREJEAN MRN: [id number] DOB: April 06, 1951 Today's Date: 03/07/2023   History of present illness Pt is a 72 y.o. male admitted 11/4 with c/o sharp left-sided chest pain. Pt diagnosed with Left anterior mediastinal mass and underwent XI robotic-assisted resection of mass with nerve block on 11/4. PMH: squamous cell carcinoma of the base of the tongue, tobacco abuse, marijuana use, lymphedema, PAD, syncope, back surgery, inguinal and umbilical hernia repair in 2023   OT comments  OT session focused on training in energy conservation strategies, including education in use of AE/DME as needed, for increased safety and independence with functional tasks with handout provided. Pt verbalized and demonstrated understanding of all education through teach back and has met energy conservation goal. Pt participated well in session and is making progress toward other OT goals. Pt's VSS on RA throughout session. Pt will benefit from continued acute skilled OT services to address deficits outlined below and increase safety and independence with functional tasks. Post acute discharge, no OT follow up is indicated at this time.       If plan is discharge home, recommend the following:  A little help with walking and/or transfers;A little help with bathing/dressing/bathroom;Assistance with cooking/housework;Assist for transportation;Help with stairs or ramp for entrance   Equipment Recommendations  None recommended by OT (Pt already has all needed equipment)    Recommendations for Other Services      Precautions / Restrictions Precautions Precautions: Sternal;Fall Restrictions Weight Bearing Restrictions: No       Mobility Bed Mobility   Bed Mobility: Supine to Sit, Sit to Supine     Supine to sit: Supervision, HOB elevated Sit to supine: Modified independent (Device/Increase time)   General bed mobility comments: Supervision for safety     Transfers                         Balance Overall balance assessment: Needs assistance Sitting-balance support: No upper extremity supported, Feet supported Sitting balance-Leahy Scale: Good     Standing balance support: No upper extremity supported Standing balance-Leahy Scale: Fair                             ADL either performed or assessed with clinical judgement   ADL Overall ADL's : Needs assistance/impaired                                       General ADL Comments: Pt currently performing ADLs Independent to Supervision for safety due to decreased activity tolerance during functional tasks. OT educated pt in and provided handouts related to energy conservation strategies, including training in use of AE/DME, with pt verbalizing and demonstrating understanding through teach back.    Extremity/Trunk Assessment Upper Extremity Assessment Upper Extremity Assessment: Right hand dominant;RUE deficits/detail;LUE deficits/detail RUE Deficits / Details: 4/5 strength in shoulder; all other strength, ROM, and coordination WNL LUE Deficits / Details: 4/5 strength in shoulder; all other strength, ROM, and coordination WNL   Lower Extremity Assessment Lower Extremity Assessment: Defer to PT evaluation        Vision       Perception     Praxis      Cognition Arousal: Alert Behavior During Therapy: [facility] for tasks assessed/performed Overall Cognitive Status: Within Functional Limits for tasks assessed  General Comments: AAOx4 and pleasant throughout session.        Exercises      Shoulder Instructions       General Comments VSS on RA throughout session    Pertinent Vitals/ Pain       Pain Assessment Pain Assessment: No/denies pain  Home Living Family/patient expects to be discharged to:: Private residence Living Arrangements: Spouse/significant other Available Help at  Discharge: Family;Friend(s);Available PRN/intermittently Type of Home: House Home Access: Stairs to enter [employer] of Steps: 2 up to porch and 1 into house Entrance Stairs-Rails:  (wall on R)                            Prior Functioning/Environment              Frequency  Min 1X/week        Progress Toward Goals  OT Goals(current goals can now be found in the care plan section)  Progress towards OT goals: Progressing toward goals  Acute Rehab OT Goals Patient Stated Goal: to return home  Plan      Co-evaluation                 AM-PAC OT "6 Clicks" Daily Activity     Outcome Measure   Help from another person eating meals?: None   Help from another person toileting, which includes using toliet, bedpan, or urinal?: A Little Help from another person bathing (including washing, rinsing, drying)?: A Little Help from another person to put on and taking off regular upper body clothing?: None Help from another person to put on and taking off regular lower body clothing?: A Little 6 Click Score: 17    End of Session    OT Visit Diagnosis: Other (comment) (decreased activity tolerance)   Activity Tolerance Patient tolerated treatment well   Patient Left in bed;with call bell/phone within reach   Nurse Communication Mobility status        Time: 0951-1010 OT Time Calculation (min): 19 min  Charges: OT General Charges $OT Visit: 1 Visit OT Treatments $Self Care/Home Management : 8-22 mins  Rashawn Rolon "Orson Eva., OTR/L, MA Acute Rehab [phone]   Lendon Colonel 03/07/2023, 10:20 AM

## 2023-03-07 NOTE — TOC Transition Note (Signed)
Transition of Care (TOC) - CM/SW Discharge Note Donn Pierini RN, BSN Transitions of Care Unit 4E- RN Case Manager See Treatment Team for direct phone #   Patient Details  Name: Angel [name] MRN: [id number] Date of Birth: 12/25/1950  [facility]) CM/SW Contact:  Darrold Span, RN Phone Number: 03/07/2023, 1:52 PM   Clinical Narrative:    Pt stable for transition home today, Family to transport home.  No TOC needs noted for HH/DME needs.   Pt to f/u as per AVS instructions.    Final next level of care: Home/Self Care Barriers to Discharge: No Barriers Identified   Patient Goals and CMS Choice   Choice offered to / list presented to : NA  Discharge Placement                 Home        Discharge Plan and Services Additional resources added to the After Visit Summary for   In-house Referral: NA Discharge Planning Services: NA Post Acute Care Choice: NA          DME Arranged: N/A DME Agency: NA       HH Arranged: NA HH Agency: NA        Social Determinants of Health (SDOH) Interventions SDOH Screenings   Food Insecurity: No Food Insecurity (03/03/2023)  Housing: Low Risk  (03/03/2023)  Transportation Needs: No Transportation Needs (03/03/2023)  Utilities: Not At Risk (03/03/2023)  Tobacco Use: Medium Risk (03/03/2023)     Readmission Risk Interventions    03/07/2023    1:51 PM  Readmission Risk Prevention Plan  Transportation Screening Complete  Home Care Screening Complete  Medication Review (RN CM) Complete

## 2023-03-10 LAB — SURGICAL PATHOLOGY

## 2023-03-14 ENCOUNTER — Other Ambulatory Visit: Payer: Self-pay | Admitting: Physician Assistant

## 2023-03-14 ENCOUNTER — Telehealth: Payer: Self-pay

## 2023-03-14 MED ORDER — OXYCODONE HCL 5 MG PO TABS: 5.0000 mg | ORAL_TABLET | Freq: Four times a day (QID) | ORAL | 0 refills | Status: DC | PRN

## 2023-03-14 NOTE — Progress Notes (Signed)
The patient's wife called requesting pain medication.  The patient has was discharged on 11/8 with 10 mg oxycodone instructed to take 1 tablet every 6 hours.  He is using them appropriately.  He will be given a refill, however we will decrease the dose to 5 mg daily every 6 hours as needed for pain.Marland Kitchen Disp #20 w/o refills  Brucha Ahlquist, PA-C 1:25 PM 03/14/23

## 2023-03-14 NOTE — Telephone Encounter (Signed)
Patient's wife, Tamela Oddi contacted the office 11/14, requesting a pain medication refill, Oxycodone. Patient was discharged from the hospital 03/07/23 s/p RATS resection of Mediastinal mass with Dr. Dorris Fetch. When asked, she said that he was taking the medication every 4-6 hrs, but only prescribed every 6 hrs. He currently has 8 pills left and she is afraid he will run out over the weekend. When she asked her husband about how often he was taking the medication he stated that he was only taking it once in the morning and once in the evening before bed.   Advised that the 7-day prescription could not be refilled until tomorrow, 11/15, but that due to him having 8 pills left he may not refill it at this time. Advised to contact the office back tomorrow if needed for possible refill. She acknowledged receipt. Also advised for patient to only take pain medication as prescribed. She acknowledged receipt.

## 2023-03-19 ENCOUNTER — Other Ambulatory Visit: Payer: Self-pay | Admitting: Thoracic Surgery (Cardiothoracic Vascular Surgery)

## 2023-03-19 DIAGNOSIS — J9859 Other diseases of mediastinum, not elsewhere classified: Secondary | ICD-10-CM

## 2023-03-20 ENCOUNTER — Encounter: Payer: Self-pay | Admitting: Thoracic Surgery (Cardiothoracic Vascular Surgery)

## 2023-03-20 ENCOUNTER — Ambulatory Visit (INDEPENDENT_AMBULATORY_CARE_PROVIDER_SITE_OTHER): Payer: Self-pay | Admitting: Thoracic Surgery (Cardiothoracic Vascular Surgery)

## 2023-03-20 ENCOUNTER — Ambulatory Visit
Admission: RE | Admit: 2023-03-20 | Discharge: 2023-03-20 | Disposition: A | Discharge status: Home / Self Care | Payer: Medicare HMO | Source: Ambulatory Visit | Attending: Thoracic Surgery (Cardiothoracic Vascular Surgery) | Admitting: Thoracic Surgery (Cardiothoracic Vascular Surgery)

## 2023-03-20 VITALS — BP 156/78 | HR 73 | Resp 20 | Wt 123.3 lb

## 2023-03-20 DIAGNOSIS — J9859 Other diseases of mediastinum, not elsewhere classified: Secondary | ICD-10-CM

## 2023-03-20 DIAGNOSIS — Z9889 Other specified postprocedural states: Secondary | ICD-10-CM | POA: Diagnosis not present

## 2023-03-20 DIAGNOSIS — Z09 Encounter for follow-up examination after completed treatment for conditions other than malignant neoplasm: Secondary | ICD-10-CM

## 2023-03-20 NOTE — Progress Notes (Signed)
[address]       [name] [address]             [phone]     HPI: Angel [name] returns for follow-up after recent thymectomy.  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  He had a CT for coronary calcium scoring back in March which showed a complex cystic anterior mediastinal mass.  Follow-up and CT showed the mass had increased in size to 5.4 x 3.6 x 5.7 cm.  There was enhancing solid 2.4 x 2 cm nodule superiorly.  I did a robotic assisted thymectomy on 03/03/2023.  The mass was adjacent to the phrenic nerve and had to be dissected off the nerve but the nerve was preserved.  He had atrial fibrillation postoperatively and was started on amiodarone.  He converted to sinus rhythm.  Also had issues with severe constipation.  Currently he is primary complaint is severe constipation.  He stopped taking oxycodone because of that.  He has a little incisional pain but his abdominal discomfort is worse.  He has had some flatus but no bowel movement over the past 48 hours.   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     Cholecalciferol (VITAMIN D3) 50 MCG (2000 UT) TABS Take 2,000 Units by mouth daily.     clopidogrel (PLAVIX) 75 MG tablet Take 1 tablet (75 mg total) by mouth daily. 90 tablet 3   escitalopram (LEXAPRO) 10 MG tablet Take 10 mg by mouth daily.     gabapentin (NEURONTIN) 300 MG capsule Take 1 capsule (300 mg total) by mouth at bedtime. 30 capsule 0   levothyroxine (SYNTHROID) 88 MCG tablet Take 88 mcg by mouth daily.     losartan (COZAAR) 25 MG tablet Take 1 tablet by mouth in the evening 30 tablet 9   methocarbamol (ROBAXIN) 500 MG tablet Take 500 mg by mouth daily.     Multiple Vitamin (MULTIVITAMIN WITH MINERALS) TABS tablet Take 1 tablet by mouth daily. Centrum 50+     oxyCODONE (OXY IR/ROXICODONE) 5 MG immediate release tablet Take 1 tablet (5 mg total) by mouth every 6 (six) hours as needed for severe pain (pain score 7-10). 20 tablet 0   rosuvastatin (CRESTOR) 20 MG tablet Take 1 tablet by mouth once daily 90 tablet 0   No current facility-administered medications for this visit.    Physical Exam BP (!) 156/78 (BP Location: Left Arm, Patient Position: Sitting, Cuff Size: Normal)  Pulse 73   Resp 20   Wt 123 lb 4.8 oz (55.9 kg)   SpO2 90%   BMI 20.47 kg/m  72 year old man in no acute distress Alert and oriented x 3 with no focal deficits Lungs clear bilaterally Cardiac regular rate and rhythm Incisions well-healed Abdomen nondistended, vaguely tender   Diagnostic Tests: I personally viewed his chest x-ray.  It shows mild elevation of the left hemidiaphragm.  FINAL MICROSCOPIC DIAGNOSIS:   A. 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  Found to have an anterior mediastinal mass on a CT for coronary calcium scoring.  Thymic squamous cell carcinoma-underwent thymectomy.  Margins clear but did have 1 of 2 nodes positive.  Likely would benefit from adjuvant chemo and radiation therapy.  Has been treated previously by Dr. Bertis Ruddy and Dr. Basilio Cairo.  Will refer back to them to get their opinion.  Constipation-has been very problematic for him he has not had a bowel movement in multiple days.  He has had some flatus over the past couple days but no stool.  Advised him to try magnesium citrate.  If that is not effective he will call back tomorrow and we will give him a prescription for lactulose.  Avoid narcotics as much as possible.  He is in a normal rhythm today.  Given his GI issues will stop amiodarone.  Continue gabapentin and Tylenol for pain.  Chest x-ray does show some mild elevation of the left hemidiaphragm.  The tumor was peeled off the phrenic nerve so not surprised by that.  In fact its relatively minor elevation.  Will monitor.   Plan: 1.Will refer to Dr. Bertis Ruddy and Dr. Basilio Cairo for consideration for adjuvant chemotherapy and/or radiation therapy 2.Magnesium citrate for constipation If no result with magnesium citrate he will call tomorrow and we will give a prescription for lactulose 3.Avoid narcotics as much as possible 4.Stop amiodarone 5.Return in 1 month with PA lateral chest x-ray.  [name], MD [facility] [phone]

## 2023-03-24 ENCOUNTER — Other Ambulatory Visit: Payer: Self-pay

## 2023-03-24 ENCOUNTER — Telehealth: Payer: Self-pay

## 2023-03-24 ENCOUNTER — Inpatient Hospital Stay (HOSPITAL_COMMUNITY)
Admission: EM | Admit: 2023-03-24 | Discharge: 2023-03-26 | DRG: 388 | Disposition: A | Discharge status: Home / Self Care | Payer: Medicare HMO | Attending: Internal Medicine | Admitting: Internal Medicine

## 2023-03-24 ENCOUNTER — Encounter (HOSPITAL_COMMUNITY): Payer: Self-pay

## 2023-03-24 ENCOUNTER — Emergency Department (HOSPITAL_COMMUNITY): Payer: Medicare HMO

## 2023-03-24 DIAGNOSIS — K5900 Constipation, unspecified: Secondary | ICD-10-CM | POA: Diagnosis not present

## 2023-03-24 DIAGNOSIS — Z8249 Family history of ischemic heart disease and other diseases of the circulatory system: Secondary | ICD-10-CM

## 2023-03-24 DIAGNOSIS — Z79890 Hormone replacement therapy (postmenopausal): Secondary | ICD-10-CM

## 2023-03-24 DIAGNOSIS — R109 Unspecified abdominal pain: Secondary | ICD-10-CM | POA: Diagnosis present

## 2023-03-24 DIAGNOSIS — N485 Ulcer of penis: Secondary | ICD-10-CM

## 2023-03-24 DIAGNOSIS — Z7982 Long term (current) use of aspirin: Secondary | ICD-10-CM

## 2023-03-24 DIAGNOSIS — Z923 Personal history of irradiation: Secondary | ICD-10-CM

## 2023-03-24 DIAGNOSIS — K5790 Diverticulosis of intestine, part unspecified, without perforation or abscess without bleeding: Secondary | ICD-10-CM | POA: Diagnosis not present

## 2023-03-24 DIAGNOSIS — I739 Peripheral vascular disease, unspecified: Secondary | ICD-10-CM

## 2023-03-24 DIAGNOSIS — R718 Other abnormality of red blood cells: Secondary | ICD-10-CM | POA: Diagnosis present

## 2023-03-24 DIAGNOSIS — R1084 Generalized abdominal pain: Secondary | ICD-10-CM | POA: Diagnosis not present

## 2023-03-24 DIAGNOSIS — J432 Centrilobular emphysema: Secondary | ICD-10-CM | POA: Diagnosis present

## 2023-03-24 DIAGNOSIS — I10 Essential (primary) hypertension: Secondary | ICD-10-CM | POA: Diagnosis present

## 2023-03-24 DIAGNOSIS — D509 Iron deficiency anemia, unspecified: Secondary | ICD-10-CM | POA: Diagnosis not present

## 2023-03-24 DIAGNOSIS — E039 Hypothyroidism, unspecified: Secondary | ICD-10-CM | POA: Diagnosis not present

## 2023-03-24 DIAGNOSIS — K5730 Diverticulosis of large intestine without perforation or abscess without bleeding: Secondary | ICD-10-CM | POA: Diagnosis present

## 2023-03-24 DIAGNOSIS — K8689 Other specified diseases of pancreas: Secondary | ICD-10-CM | POA: Diagnosis present

## 2023-03-24 DIAGNOSIS — Z79899 Other long term (current) drug therapy: Secondary | ICD-10-CM | POA: Diagnosis not present

## 2023-03-24 DIAGNOSIS — Z7902 Long term (current) use of antithrombotics/antiplatelets: Secondary | ICD-10-CM

## 2023-03-24 DIAGNOSIS — K5903 Drug induced constipation: Secondary | ICD-10-CM | POA: Diagnosis present

## 2023-03-24 DIAGNOSIS — K449 Diaphragmatic hernia without obstruction or gangrene: Secondary | ICD-10-CM | POA: Diagnosis not present

## 2023-03-24 DIAGNOSIS — D75839 Thrombocytosis, unspecified: Secondary | ICD-10-CM | POA: Diagnosis present

## 2023-03-24 DIAGNOSIS — K567 Ileus, unspecified: Secondary | ICD-10-CM

## 2023-03-24 DIAGNOSIS — F17210 Nicotine dependence, cigarettes, uncomplicated: Secondary | ICD-10-CM | POA: Diagnosis present

## 2023-03-24 DIAGNOSIS — J9859 Other diseases of mediastinum, not elsewhere classified: Secondary | ICD-10-CM | POA: Diagnosis present

## 2023-03-24 DIAGNOSIS — Z85810 Personal history of malignant neoplasm of tongue: Secondary | ICD-10-CM | POA: Diagnosis not present

## 2023-03-24 DIAGNOSIS — K560 Paralytic ileus: Secondary | ICD-10-CM | POA: Diagnosis not present

## 2023-03-24 DIAGNOSIS — R195 Other fecal abnormalities: Principal | ICD-10-CM | POA: Diagnosis present

## 2023-03-24 DIAGNOSIS — Z85238 Personal history of other malignant neoplasm of thymus: Secondary | ICD-10-CM

## 2023-03-24 DIAGNOSIS — K264 Chronic or unspecified duodenal ulcer with hemorrhage: Principal | ICD-10-CM | POA: Diagnosis present

## 2023-03-24 DIAGNOSIS — K222 Esophageal obstruction: Secondary | ICD-10-CM | POA: Diagnosis not present

## 2023-03-24 DIAGNOSIS — C37 Malignant neoplasm of thymus: Secondary | ICD-10-CM | POA: Diagnosis not present

## 2023-03-24 DIAGNOSIS — D649 Anemia, unspecified: Secondary | ICD-10-CM | POA: Diagnosis not present

## 2023-03-24 DIAGNOSIS — R1030 Lower abdominal pain, unspecified: Secondary | ICD-10-CM | POA: Diagnosis present

## 2023-03-24 DIAGNOSIS — K2950 Unspecified chronic gastritis without bleeding: Secondary | ICD-10-CM | POA: Diagnosis not present

## 2023-03-24 DIAGNOSIS — F1210 Cannabis abuse, uncomplicated: Secondary | ICD-10-CM | POA: Diagnosis present

## 2023-03-24 DIAGNOSIS — K269 Duodenal ulcer, unspecified as acute or chronic, without hemorrhage or perforation: Secondary | ICD-10-CM | POA: Diagnosis not present

## 2023-03-24 LAB — RAPID URINE DRUG SCREEN, HOSP PERFORMED
Amphetamines: NOT DETECTED
Barbiturates: NOT DETECTED
Benzodiazepines: POSITIVE — AB
Cocaine: NOT DETECTED
Opiates: NOT DETECTED
Tetrahydrocannabinol: POSITIVE — AB

## 2023-03-24 LAB — COMPREHENSIVE METABOLIC PANEL
ALT: 14 U/L (ref 0–44)
AST: 21 U/L (ref 15–41)
Albumin: 3.9 g/dL (ref 3.5–5.0)
Alkaline Phosphatase: 59 U/L (ref 38–126)
Anion gap: 10 (ref 5–15)
BUN: 10 mg/dL (ref 8–23)
CO2: 21 mmol/L — ABNORMAL LOW (ref 22–32)
Calcium: 9 mg/dL (ref 8.9–10.3)
Chloride: 104 mmol/L (ref 98–111)
Creatinine, Ser: 1.01 mg/dL (ref 0.61–1.24)
GFR, Estimated: >60 mL/min (ref >60)
Glucose, Bld: 116 mg/dL — ABNORMAL HIGH (ref 70–99)
Potassium: 4.1 mmol/L (ref 3.5–5.1)
Sodium: 135 mmol/L (ref 135–145)
Total Bilirubin: 0.6 mg/dL (ref <1.2)
Total Protein: 7.3 g/dL (ref 6.5–8.1)

## 2023-03-24 LAB — CBC WITH DIFFERENTIAL/PLATELET
Abs Immature Granulocytes: 0.07 10*3/uL (ref 0.00–0.07)
Basophils Absolute: 0.2 10*3/uL — ABNORMAL HIGH (ref 0.0–0.1)
Basophils Relative: 2 %
Eosinophils Absolute: 0.3 10*3/uL (ref 0.0–0.5)
Eosinophils Relative: 3 %
HCT: 29.5 % — ABNORMAL LOW (ref 39.0–52.0)
Hemoglobin: 8.4 g/dL — ABNORMAL LOW (ref 13.0–17.0)
Immature Granulocytes: 1 %
Lymphocytes Relative: 9 %
Lymphs Abs: 0.9 10*3/uL (ref 0.7–4.0)
MCH: 22.7 pg — ABNORMAL LOW (ref 26.0–34.0)
MCHC: 28.5 g/dL — ABNORMAL LOW (ref 30.0–36.0)
MCV: 79.7 fL — ABNORMAL LOW (ref 80.0–100.0)
Monocytes Absolute: 0.7 10*3/uL (ref 0.1–1.0)
Monocytes Relative: 7 %
Neutro Abs: 8.1 10*3/uL — ABNORMAL HIGH (ref 1.7–7.7)
Neutrophils Relative %: 78 %
Platelets: 701 10*3/uL — ABNORMAL HIGH (ref 150–400)
RBC: 3.7 MIL/uL — ABNORMAL LOW (ref 4.22–5.81)
RDW: 18.6 % — ABNORMAL HIGH (ref 11.5–15.5)
WBC: 10.1 10*3/uL (ref 4.0–10.5)
nRBC: 0 % (ref 0.0–0.2)

## 2023-03-24 LAB — URINALYSIS, W/ REFLEX TO CULTURE (INFECTION SUSPECTED)
Bilirubin Urine: NEGATIVE
Glucose, UA: NEGATIVE mg/dL
Ketones, ur: NEGATIVE mg/dL
Leukocytes,Ua: NEGATIVE
Nitrite: NEGATIVE
Protein, ur: NEGATIVE mg/dL
Specific Gravity, Urine: 1.015 (ref 1.005–1.030)
pH: 5 (ref 5.0–8.0)

## 2023-03-24 LAB — LIPASE, BLOOD: Lipase: 48 U/L (ref 11–51)

## 2023-03-24 LAB — POC OCCULT BLOOD, ED: Fecal Occult Bld: POSITIVE — AB

## 2023-03-24 MED ORDER — ACETAMINOPHEN 325 MG PO TABS
650.0000 mg | ORAL_TABLET | Freq: Four times a day (QID) | ORAL | Status: DC | PRN
  Administered 2023-03-24 – 2023-03-26 (×2): 650 mg via ORAL
  Filled 2023-03-24 (×2): qty 2

## 2023-03-24 MED ORDER — SODIUM CHLORIDE 0.9% FLUSH
3.0000 mL | Freq: Two times a day (BID) | INTRAVENOUS | Status: DC
  Administered 2023-03-24 – 2023-03-26 (×4): 3 mL via INTRAVENOUS

## 2023-03-24 MED ORDER — ENOXAPARIN SODIUM 40 MG/0.4ML IJ SOSY
40.0000 mg | PREFILLED_SYRINGE | INTRAMUSCULAR | Status: DC
Start: 2023-03-25 — End: 2023-03-25
  Filled 2023-03-24: qty 0.4

## 2023-03-24 MED ORDER — POLYETHYLENE GLYCOL 3350 17 G PO PACK
17.0000 g | PACK | Freq: Every day | ORAL | Status: DC
  Administered 2023-03-24: 17 g via ORAL
  Filled 2023-03-24 (×2): qty 1

## 2023-03-24 MED ORDER — IOHEXOL 300 MG/ML  SOLN
100.0000 mL | Freq: Once | INTRAMUSCULAR | Status: AC | PRN
  Administered 2023-03-24: 100 mL via INTRAVENOUS

## 2023-03-24 MED ORDER — POLYETHYLENE GLYCOL 3350 17 G PO PACK: 17.0000 g | PACK | Freq: Every day | ORAL | Status: DC | PRN

## 2023-03-24 MED ORDER — CELECOXIB 200 MG PO CAPS
200.0000 mg | ORAL_CAPSULE | Freq: Two times a day (BID) | ORAL | Status: DC | PRN
  Administered 2023-03-25: 200 mg via ORAL
  Filled 2023-03-24: qty 1

## 2023-03-24 MED ORDER — MORPHINE SULFATE (PF) 4 MG/ML IV SOLN
4.0000 mg | Freq: Once | INTRAVENOUS | Status: AC
  Administered 2023-03-24: 4 mg via INTRAVENOUS
  Filled 2023-03-24: qty 1

## 2023-03-24 MED ORDER — ACETAMINOPHEN 650 MG RE SUPP: 650.0000 mg | Freq: Four times a day (QID) | RECTAL | Status: DC | PRN

## 2023-03-24 MED ORDER — SENNOSIDES-DOCUSATE SODIUM 8.6-50 MG PO TABS
2.0000 | ORAL_TABLET | Freq: Two times a day (BID) | ORAL | Status: DC
  Administered 2023-03-24: 2 via ORAL
  Filled 2023-03-24 (×3): qty 2

## 2023-03-24 NOTE — ED Triage Notes (Signed)
C/o constipation, generalized abd pain, and nausea x4 days. Patient is passing flatus.  LBM:4 days ago.  Colace, mag citrate, and fleet enema without relief.  Pt reports since thymectomy on 03/03/2023 been having constipation.

## 2023-03-24 NOTE — Assessment & Plan Note (Signed)
Patinet advised remission

## 2023-03-24 NOTE — ED Provider Notes (Signed)
[place] EMERGENCY DEPARTMENT AT [facility] Provider Note   CSN: [id number] Arrival date & time: 03/24/23  1445     History  Chief Complaint  Patient presents with  . Constipation    Angel [name] is a 72 y.o. male.   Constipation    Patient has a history of malignant neoplasm of the tongue, hypertension, hypothyroidism, chronic tobacco use, recent surgery for mediastinal mass on November 4.  Patient had been taken opiate pain medications.  Patient states he started having trouble with constipation and has not had a bowel movement in the last 5 days.  Patient saw his surgeon on the 21st.  He was given a recommendation to take magnesium citrate.  Patient has not had any improvement since then.  Patient states he still has not had a bowel movement.  He is now having pain in his abdomen.  Some in his rectal area as well.  He denies any trouble urinating.  No fevers or chills.  He has been nauseated but has not vomited.  Home Medications Prior to Admission medications   Medication Sig Start Date End Date Taking? Authorizing Provider  aspirin (ASPIRIN CHILDRENS) 81 MG chewable tablet Chew 1 tablet (81 mg total) by mouth daily. 11/29/22   [name], MD  Cholecalciferol (VITAMIN D3) 50 MCG (2000 UT) TABS Take 2,000 Units by mouth daily.    [provider]  clopidogrel (PLAVIX) 75 MG tablet Take 1 tablet (75 mg total) by mouth daily. 10/08/22   [name], MD  escitalopram (LEXAPRO) 10 MG tablet Take 10 mg by mouth daily. 10/01/21   [provider]  gabapentin (NEURONTIN) 300 MG capsule Take 1 capsule (300 mg total) by mouth at bedtime. 03/07/23   Ardelle Balls, PA-C  levothyroxine (SYNTHROID) 88 MCG tablet Take 88 mcg by mouth daily. 06/10/19   [provider]  losartan (COZAAR) 25 MG tablet Take 1 tablet by mouth in the evening 01/31/23   [name], MD  methocarbamol (ROBAXIN) 500 MG tablet Take 500 mg by mouth daily.    [provider]   Multiple Vitamin (MULTIVITAMIN WITH MINERALS) TABS tablet Take 1 tablet by mouth daily. Centrum 50+    [provider]  oxyCODONE (OXY IR/ROXICODONE) 5 MG immediate release tablet Take 1 tablet (5 mg total) by mouth every 6 (six) hours as needed for severe pain (pain score 7-10). 03/16/23   Barrett, Erin R, PA-C  rosuvastatin (CRESTOR) 20 MG tablet Take 1 tablet by mouth once daily 03/03/23   [name], MD      Allergies    Patient has no known allergies.    Review of Systems   Review of Systems  Gastrointestinal:  Positive for constipation.    Physical Exam Updated Vital Signs BP 136/73   Pulse 71   Temp (!) 97.5 F (36.4 C) (Oral)   Resp 18   Ht 1.651 m (5\' 5" )   Wt 55.8 kg   SpO2 100%   BMI 20.47 kg/m  Physical Exam Vitals and nursing note reviewed.  Constitutional:      Appearance: He is well-developed. He is ill-appearing.  HENT:     Head: Normocephalic and atraumatic.     Right Ear: External ear normal.     Left Ear: External ear normal.  Eyes:     General: No scleral icterus.       Right eye: No discharge.        Left eye: No discharge.  Conjunctiva/sclera: Conjunctivae normal.  Neck:     Trachea: No tracheal deviation.  Cardiovascular:     Rate and Rhythm: Normal rate and regular rhythm.  Pulmonary:     Effort: Pulmonary effort is normal. No respiratory distress.     Breath sounds: Normal breath sounds. No stridor. No wheezing or rales.  Abdominal:     General: Bowel sounds are normal. There is no distension.     Palpations: Abdomen is soft.     Tenderness: There is abdominal tenderness. There is no guarding or rebound.  Genitourinary:    Comments: Rectal exam dark-colored stool, no blood, no mass, no fecal impaction noted Musculoskeletal:        General: No tenderness or deformity.     Cervical back: Neck supple.  Skin:    General: Skin is warm and dry.     Findings: No rash.  Neurological:     General: No focal deficit present.      Mental Status: He is alert.     Cranial Nerves: No cranial nerve deficit, dysarthria or facial asymmetry.     Sensory: No sensory deficit.     Motor: No abnormal muscle tone or seizure activity.     Coordination: Coordination normal.  Psychiatric:        Mood and Affect: Mood normal.     ED Results / Procedures / Treatments   Labs (all labs ordered are listed, but only abnormal results are displayed) Labs Reviewed  CBC WITH DIFFERENTIAL/PLATELET - Abnormal; Notable for the following components:      Result Value   RBC 3.70 (*)    Hemoglobin 8.4 (*)    HCT 29.5 (*)    MCV 79.7 (*)    MCH 22.7 (*)    MCHC 28.5 (*)    RDW 18.6 (*)    Platelets 701 (*)    Neutro Abs 8.1 (*)    Basophils Absolute 0.2 (*)    All other components within normal limits  COMPREHENSIVE METABOLIC PANEL - Abnormal; Notable for the following components:   CO2 21 (*)    Glucose, Bld 116 (*)    All other components within normal limits  URINALYSIS, W/ REFLEX TO CULTURE (INFECTION SUSPECTED) - Abnormal; Notable for the following components:   APPearance HAZY (*)    Hgb urine dipstick SMALL (*)    Bacteria, UA RARE (*)    All other components within normal limits  POC OCCULT BLOOD, ED - Abnormal; Notable for the following components:   Fecal Occult Bld POSITIVE (*)    All other components within normal limits  LIPASE, BLOOD    EKG None  Radiology CT ABDOMEN PELVIS W CONTRAST  Result Date: 03/24/2023 CLINICAL DATA:  Generalized abdominal pain, most prominent in the left lower quadrant. Nausea. Constipation. EXAM: CT ABDOMEN AND PELVIS WITH CONTRAST TECHNIQUE: Multidetector CT imaging of the abdomen and pelvis was performed using the standard protocol following bolus administration of intravenous contrast. RADIATION DOSE REDUCTION: This exam was performed according to the departmental dose-optimization program which includes automated exposure control, adjustment of the mA and/or kV according to patient  size and/or use of iterative reconstruction technique. CONTRAST:  OMNIPAQUE IOHEXOL 300 MG/ML  SOLN COMPARISON:  08/25/2015 CT abdomen/pelvis. FINDINGS: Lower chest: Trace layering left pleural effusion. Mild elevation of the left hemidiaphragm with platelike mild-to-moderate left lung base atelectasis. Hepatobiliary: Normal liver size. No liver mass. Normal gallbladder with no radiopaque cholelithiasis. No biliary ductal dilatation. Pancreas: Chronic diffuse pancreatic duct dilation  up to 0.5 cm diameter, increased from 0.3 cm on 08/25/2015 CT. No discrete pancreatic mass. No pancreatic parenchymal calcifications or peripancreatic inflammatory changes. Spleen: Normal size. No mass. Adrenals/Urinary Tract: Normal adrenals. Normal kidneys with no hydronephrosis and no renal mass. Normal bladder. Stomach/Bowel: Normal non-distended stomach. Generalized mildly prominent fluid-filled small bowel loops measuring up to 3.3 cm diameter in the left abdomen. No discrete small bowel caliber transition. No small bowel wall thickening. Normal appendix. Fluid levels throughout right, transverse and descending colon. Minimal sigmoid diverticulosis with no large bowel wall thickening or significant pericolonic fat stranding. Vascular/Lymphatic: Atherosclerotic nonaneurysmal abdominal aorta. Patent portal, splenic, hepatic and renal veins. No pathologically enlarged lymph nodes in the abdomen or pelvis. Reproductive: Normal size prostate. Other: No pneumoperitoneum, ascites or focal fluid collection. Surgical clips noted in the left inguinal and ventral left lower quadrant region. No evidence of ventral or inguinal hernia. Musculoskeletal: No aggressive appearing focal osseous lesions. Marked thoracolumbar spondylosis. IMPRESSION: 1. Generalized mildly prominent fluid-filled small bowel loops without discrete small bowel caliber transition. Fluid levels throughout the right, transverse and descending colon. No bowel wall  thickening. Findings are nonspecific but most suggestive of mild adynamic ileus such as due to enterocolitis. 2. Minimal sigmoid diverticulosis. 3. Chronic diffuse pancreatic duct dilation up to 0.5 cm diameter, mildly increased from 0.3 cm on 08/25/2015 CT. No discrete pancreatic mass. Suggest nonemergent outpatient MRI abdomen with MRCP without and with IV contrast for further evaluation. 4. Trace layering left pleural effusion. Mild elevation of the left hemidiaphragm with platelike mild-to-moderate left lung base atelectasis. 5.  Aortic Atherosclerosis (ICD10-I70.0). Electronically Signed   By: Delbert Phenix M.D.   On: 03/24/2023 18:16    Procedures Procedures    Medications Ordered in ED Medications  morphine (PF) 4 MG/ML injection 4 mg (4 mg Intravenous Given 03/24/23 1719)  iohexol (OMNIPAQUE) 300 MG/ML solution 100 mL (100 mLs Intravenous Contrast Given 03/24/23 1731)    ED Course/ Medical Decision Making/ A&P Clinical Course as of 03/24/23 1915  Mon Mar 24, 2023  1702 CBC with Differential(!) Anemia noted, hemoglobin similar to previous, platelets increased compared to previous [JK]  1755 Fecal occult is positive [JK]  1827 CT scan shows fluid-filled loops of small bowel without obvious signs of obstruction, no bowel wall thickening possible adynamic ileus, increase pancreatic dilatation [JK]  1915 Discussed with Dr. Maryjean Ka.  Secure chat routine GI consult message sent to Dr. Pati Gallo and Dr. Elnoria Howard [JK]    Clinical Course User Index [JK] Linwood Dibbles, MD                                 Medical Decision Making Problems Addressed: Dilated pancreatic duct: undiagnosed new problem with uncertain prognosis [facility]): acute illness or injury that poses a threat to life or bodily functions Occult blood positive stool: acute illness or injury that poses a threat to life or bodily functions  Amount and/or Complexity of Data Reviewed Labs: ordered. Decision-making details documented in ED  Course. Radiology: ordered and independent interpretation performed.  Risk Prescription drug management.   Patient presented to the ED with complaints of abdominal pain nausea feeling of constipation.  Patient has been using laxatives including Colace magnesium citrate.  Patient recently started taking narcotics for thymectomy.  Patient on exam has no evidence of fecal impaction.  Labs notable for persistent anemia however the patient is also Hemoccult positive now.  Stool is dark in  color concerning for the possibility of upper GI bleeding.  Patient's CT scan shows evidence of ileus but no signs of severe constipation or obstruction.  With his baseline anemia and Hemoccult positive stools I think he would benefit from admission to the hospital serial hemoglobin GI consultation.  I will consult with the medical service         Final Clinical Impression(s) / ED Diagnoses Final diagnoses:  Occult blood positive stool  Dilated pancreatic duct  Ileus [facility])    Rx / DC Orders ED Discharge Orders     None         Linwood Dibbles, MD 03/24/23 [phone]

## 2023-03-24 NOTE — Telephone Encounter (Signed)
Alert received from [employer] for Pt. initiated transmission, 1 symptom, annotation "SOB" 54 logged tachy events, longest duration 11sec, HR's 162-186, some irregularity No alerts programmed on, programmed syncope protocol Route to triage for tachy events LA, CVRS      Patient needs follow up call due to arrival at [facility] ED for constipation on 03/24/23.

## 2023-03-24 NOTE — Assessment & Plan Note (Signed)
Based on discharge summary for March 03, 2023 : he had a CT for coronary calcium screening back in March.  Coronary calcium score was 323 which was 65th percentile for age.  He was noted to have a 4.8 x 4 cm complex cystic anterior mediastinal mass.   He had a CT in September which showed the mass was still present.  Complete measurement showed 5.4 x 3.6 x 5.7 cm cystic mass with an enhancing solid 2.4 x 2 cm nodule superiorly.     He smoked for 45 years prior to quitting in 2019.  Complains of occasional sharp left-sided chest pain lasting usually less than a minute.  Not associated with exertion.  Does have claudication.  Had an iliac stent in early August by Dr. Jacinto Halim.  Has had syncopal spells.  No double vision or weakness.   Hospital Course: Patient underwent a Xi assisted left VATS, resection of thymus. Patient was extubated and transported from the OR to PACU in stable condition. Chest tube was to water seal and there was no air leak. A line was removed and IVF were stopped on 11/05. He had a fair amount of pain on the left side (wounds, chest tube, fractured rib) so medications were adjusted accordingly. He was hypertensive and was restarted on Losartan 25 mg daily. Some of the hypertension was likely related to uncontrolled pain. He has been tolerating a diet. He was on 2 liters of oxygen via [place] and was weaned to room air. Chest tube was removed on 11/06. Same day chest x ray showed trace apical pneumothorax on the left. His HR increased into the 150's and rhythm appeared to be a fib with RVR. He was put on an Amiodarone drip. He converted to sinus rhythm and was transitioned to oral Amiodarone.  He was started on home Plavix regimen prior to discharge.  He was constipated and treated with laxative with relief of symptoms.   All wounds are clean, dry, healing without signs of infection. Chest x ray today shows stable left apical pneumothorax, probable small left pleural effusion. He is stable for  discharge today.  FINAL MICROSCOPIC DIAGNOSIS:   A. 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

## 2023-03-24 NOTE — Assessment & Plan Note (Signed)
Left penile shaft SQ induration with ulceration/scabbing on top. Will check RPR. Given h/o malignancy, may need biopsy is negative.

## 2023-03-24 NOTE — Assessment & Plan Note (Signed)
Chronic, asymptomatic.  Anemia workup ordered for the morning including haptoglobin.

## 2023-03-24 NOTE — Assessment & Plan Note (Addendum)
X 4days. A,w, consitpatin, patinet is passing gas however.  Avoid opiates, ordered for laxatives.  Patient may have mild adynamic ileus due to recent opiate use.  Check magnesium and Phos level.  ER has requested GI evaluation in the morning.  Given Hemoccult positive stools.  Patient has minimal elevation of leukocytes in urine, however no typical symptoms of UTI such as dysuria or frequency.  We will continue to monitor clinically.  Also noted to have incidental pancreatic ductal dilation.  This is not consistent with patient's current presentation.  Will need outpatient routine MRI.  Lets see what GI input says.

## 2023-03-24 NOTE — Assessment & Plan Note (Signed)
This is chronic, followed by hematology, will need outpatient follow-up.  Because it is worse than before.
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
## 2023-03-25 ENCOUNTER — Other Ambulatory Visit: Payer: Self-pay | Admitting: Hematology and Oncology

## 2023-03-25 DIAGNOSIS — J432 Centrilobular emphysema: Secondary | ICD-10-CM | POA: Diagnosis present

## 2023-03-25 DIAGNOSIS — K567 Ileus, unspecified: Secondary | ICD-10-CM

## 2023-03-25 DIAGNOSIS — D649 Anemia, unspecified: Secondary | ICD-10-CM | POA: Diagnosis not present

## 2023-03-25 DIAGNOSIS — K2950 Unspecified chronic gastritis without bleeding: Secondary | ICD-10-CM | POA: Diagnosis not present

## 2023-03-25 DIAGNOSIS — C37 Malignant neoplasm of thymus: Secondary | ICD-10-CM | POA: Insufficient documentation

## 2023-03-25 DIAGNOSIS — Z8249 Family history of ischemic heart disease and other diseases of the circulatory system: Secondary | ICD-10-CM | POA: Diagnosis not present

## 2023-03-25 DIAGNOSIS — R718 Other abnormality of red blood cells: Secondary | ICD-10-CM | POA: Diagnosis present

## 2023-03-25 DIAGNOSIS — K269 Duodenal ulcer, unspecified as acute or chronic, without hemorrhage or perforation: Secondary | ICD-10-CM | POA: Diagnosis not present

## 2023-03-25 DIAGNOSIS — D509 Iron deficiency anemia, unspecified: Secondary | ICD-10-CM | POA: Diagnosis not present

## 2023-03-25 DIAGNOSIS — K8689 Other specified diseases of pancreas: Secondary | ICD-10-CM | POA: Diagnosis not present

## 2023-03-25 DIAGNOSIS — Z85810 Personal history of malignant neoplasm of tongue: Secondary | ICD-10-CM | POA: Diagnosis not present

## 2023-03-25 DIAGNOSIS — K560 Paralytic ileus: Secondary | ICD-10-CM | POA: Diagnosis present

## 2023-03-25 DIAGNOSIS — Z79890 Hormone replacement therapy (postmenopausal): Secondary | ICD-10-CM | POA: Diagnosis not present

## 2023-03-25 DIAGNOSIS — R195 Other fecal abnormalities: Secondary | ICD-10-CM | POA: Diagnosis not present

## 2023-03-25 DIAGNOSIS — Z85238 Personal history of other malignant neoplasm of thymus: Secondary | ICD-10-CM | POA: Diagnosis not present

## 2023-03-25 DIAGNOSIS — Z79899 Other long term (current) drug therapy: Secondary | ICD-10-CM | POA: Diagnosis not present

## 2023-03-25 DIAGNOSIS — K449 Diaphragmatic hernia without obstruction or gangrene: Secondary | ICD-10-CM | POA: Diagnosis not present

## 2023-03-25 DIAGNOSIS — F1210 Cannabis abuse, uncomplicated: Secondary | ICD-10-CM | POA: Diagnosis present

## 2023-03-25 DIAGNOSIS — R1030 Lower abdominal pain, unspecified: Secondary | ICD-10-CM | POA: Diagnosis present

## 2023-03-25 DIAGNOSIS — R1084 Generalized abdominal pain: Secondary | ICD-10-CM

## 2023-03-25 DIAGNOSIS — I10 Essential (primary) hypertension: Secondary | ICD-10-CM | POA: Diagnosis present

## 2023-03-25 DIAGNOSIS — E039 Hypothyroidism, unspecified: Secondary | ICD-10-CM | POA: Diagnosis present

## 2023-03-25 DIAGNOSIS — F17210 Nicotine dependence, cigarettes, uncomplicated: Secondary | ICD-10-CM | POA: Diagnosis present

## 2023-03-25 DIAGNOSIS — Z7902 Long term (current) use of antithrombotics/antiplatelets: Secondary | ICD-10-CM | POA: Diagnosis not present

## 2023-03-25 DIAGNOSIS — D75839 Thrombocytosis, unspecified: Secondary | ICD-10-CM | POA: Diagnosis not present

## 2023-03-25 DIAGNOSIS — D539 Nutritional anemia, unspecified: Secondary | ICD-10-CM

## 2023-03-25 DIAGNOSIS — K264 Chronic or unspecified duodenal ulcer with hemorrhage: Secondary | ICD-10-CM | POA: Diagnosis present

## 2023-03-25 DIAGNOSIS — N485 Ulcer of penis: Secondary | ICD-10-CM | POA: Diagnosis not present

## 2023-03-25 DIAGNOSIS — K5730 Diverticulosis of large intestine without perforation or abscess without bleeding: Secondary | ICD-10-CM | POA: Diagnosis not present

## 2023-03-25 DIAGNOSIS — Z923 Personal history of irradiation: Secondary | ICD-10-CM | POA: Diagnosis not present

## 2023-03-25 DIAGNOSIS — Z7982 Long term (current) use of aspirin: Secondary | ICD-10-CM | POA: Diagnosis not present

## 2023-03-25 DIAGNOSIS — K5903 Drug induced constipation: Secondary | ICD-10-CM | POA: Diagnosis present

## 2023-03-25 DIAGNOSIS — K222 Esophageal obstruction: Secondary | ICD-10-CM | POA: Diagnosis not present

## 2023-03-25 LAB — APTT: aPTT: 28 s (ref 24–36)

## 2023-03-25 LAB — CBC
HCT: 26.1 % — ABNORMAL LOW (ref 39.0–52.0)
Hemoglobin: 7.2 g/dL — ABNORMAL LOW (ref 13.0–17.0)
MCH: 22.2 pg — ABNORMAL LOW (ref 26.0–34.0)
MCHC: 27.6 g/dL — ABNORMAL LOW (ref 30.0–36.0)
MCV: 80.3 fL (ref 80.0–100.0)
Platelets: 573 10*3/uL — ABNORMAL HIGH (ref 150–400)
RBC: 3.25 MIL/uL — ABNORMAL LOW (ref 4.22–5.81)
RDW: 18.6 % — ABNORMAL HIGH (ref 11.5–15.5)
WBC: 8.3 10*3/uL (ref 4.0–10.5)
nRBC: 0 % (ref 0.0–0.2)

## 2023-03-25 LAB — BASIC METABOLIC PANEL
Anion gap: 6 (ref 5–15)
BUN: 14 mg/dL (ref 8–23)
CO2: 25 mmol/L (ref 22–32)
Calcium: 8.4 mg/dL — ABNORMAL LOW (ref 8.9–10.3)
Chloride: 105 mmol/L (ref 98–111)
Creatinine, Ser: 0.89 mg/dL (ref 0.61–1.24)
GFR, Estimated: >60 mL/min (ref >60)
Glucose, Bld: 97 mg/dL (ref 70–99)
Potassium: 3.7 mmol/L (ref 3.5–5.1)
Sodium: 136 mmol/L (ref 135–145)

## 2023-03-25 LAB — PROTIME-INR
INR: 1.1 (ref 0.8–1.2)
Prothrombin Time: 14.8 s (ref 11.4–15.2)

## 2023-03-25 LAB — RETICULOCYTES
Immature Retic Fract: 32.3 % — ABNORMAL HIGH (ref 2.3–15.9)
RBC.: 3.23 MIL/uL — ABNORMAL LOW (ref 4.22–5.81)
Retic Count, Absolute: 74.9 10*3/uL (ref 19.0–186.0)
Retic Ct Pct: 2.3 % (ref 0.4–3.1)

## 2023-03-25 LAB — VITAMIN B12: Vitamin B-12: 355 pg/mL (ref 180–914)

## 2023-03-25 LAB — RPR: RPR Ser Ql: NONREACTIVE

## 2023-03-25 LAB — IRON AND TIBC
Iron: 16 ug/dL — ABNORMAL LOW (ref 45–182)
Saturation Ratios: 4 % — ABNORMAL LOW (ref 17.9–39.5)
TIBC: 403 ug/dL (ref 250–450)
UIBC: 387 ug/dL

## 2023-03-25 LAB — TSH: TSH: 6.849 u[IU]/mL — ABNORMAL HIGH (ref 0.350–4.500)

## 2023-03-25 LAB — MAGNESIUM: Magnesium: 2.5 mg/dL — ABNORMAL HIGH (ref 1.7–2.4)

## 2023-03-25 LAB — ABO/RH: ABO/RH(D): A POS

## 2023-03-25 LAB — PHOSPHORUS: Phosphorus: 2.7 mg/dL (ref 2.5–4.6)

## 2023-03-25 LAB — HIV ANTIBODY (ROUTINE TESTING W REFLEX): HIV Screen 4th Generation wRfx: NONREACTIVE

## 2023-03-25 LAB — FOLATE: Folate: 24.3 ng/mL (ref >5.9)

## 2023-03-25 LAB — FERRITIN: Ferritin: 22 ng/mL — ABNORMAL LOW (ref 24–336)

## 2023-03-25 LAB — PREPARE RBC (CROSSMATCH)

## 2023-03-25 MED ORDER — BISACODYL 10 MG RE SUPP
10.0000 mg | Freq: Once | RECTAL | Status: AC
  Administered 2023-03-25: 10 mg via RECTAL
  Filled 2023-03-25: qty 1

## 2023-03-25 MED ORDER — DIPHENHYDRAMINE HCL 25 MG PO CAPS
25.0000 mg | ORAL_CAPSULE | Freq: Once | ORAL | Status: AC
  Administered 2023-03-25: 25 mg via ORAL
  Filled 2023-03-25: qty 1

## 2023-03-25 MED ORDER — PANTOPRAZOLE SODIUM 40 MG PO TBEC
40.0000 mg | DELAYED_RELEASE_TABLET | Freq: Every day | ORAL | Status: DC
  Administered 2023-03-25 – 2023-03-26 (×2): 40 mg via ORAL
  Filled 2023-03-25 (×2): qty 1

## 2023-03-25 MED ORDER — TRAMADOL HCL 50 MG PO TABS
100.0000 mg | ORAL_TABLET | Freq: Four times a day (QID) | ORAL | Status: DC | PRN
  Administered 2023-03-25: 100 mg via ORAL
  Filled 2023-03-25: qty 2

## 2023-03-25 MED ORDER — METHOCARBAMOL 500 MG PO TABS
500.0000 mg | ORAL_TABLET | Freq: Four times a day (QID) | ORAL | Status: DC | PRN
  Administered 2023-03-25 – 2023-03-26 (×2): 500 mg via ORAL
  Filled 2023-03-25 (×3): qty 1

## 2023-03-25 MED ORDER — VITAMIN D 25 MCG (1000 UNIT) PO TABS
2000.0000 [IU] | ORAL_TABLET | Freq: Every day | ORAL | Status: DC
  Administered 2023-03-25 – 2023-03-26 (×2): 2000 [IU] via ORAL
  Filled 2023-03-25 (×2): qty 2

## 2023-03-25 MED ORDER — LEVOTHYROXINE SODIUM 88 MCG PO TABS
88.0000 ug | ORAL_TABLET | Freq: Every day | ORAL | Status: DC
  Administered 2023-03-26: 88 ug via ORAL
  Filled 2023-03-25: qty 1

## 2023-03-25 MED ORDER — ACETAMINOPHEN 325 MG PO TABS
650.0000 mg | ORAL_TABLET | Freq: Once | ORAL | Status: AC
  Administered 2023-03-25: 650 mg via ORAL
  Filled 2023-03-25: qty 2

## 2023-03-25 MED ORDER — LOSARTAN POTASSIUM 25 MG PO TABS
25.0000 mg | ORAL_TABLET | Freq: Every evening | ORAL | Status: DC
  Administered 2023-03-25: 25 mg via ORAL
  Filled 2023-03-25: qty 1

## 2023-03-25 MED ORDER — ROSUVASTATIN CALCIUM 20 MG PO TABS
20.0000 mg | ORAL_TABLET | Freq: Every day | ORAL | Status: DC
  Administered 2023-03-25 – 2023-03-26 (×2): 20 mg via ORAL
  Filled 2023-03-25 (×2): qty 1

## 2023-03-25 MED ORDER — ADULT MULTIVITAMIN W/MINERALS CH
1.0000 | ORAL_TABLET | Freq: Every day | ORAL | Status: DC
  Administered 2023-03-25 – 2023-03-26 (×2): 1 via ORAL
  Filled 2023-03-25 (×2): qty 1

## 2023-03-25 MED ORDER — GABAPENTIN 100 MG PO CAPS
300.0000 mg | ORAL_CAPSULE | Freq: Every day | ORAL | Status: DC
  Administered 2023-03-25: 300 mg via ORAL
  Filled 2023-03-25: qty 3

## 2023-03-25 MED ORDER — PEG 3350-KCL-NA BICARB-NACL 420 G PO SOLR
4000.0000 mL | Freq: Once | ORAL | Status: AC
  Administered 2023-03-25: 4000 mL via ORAL

## 2023-03-25 MED ORDER — SODIUM CHLORIDE 0.9% IV SOLUTION: Freq: Once | INTRAVENOUS | Status: AC
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
## 2023-03-25 NOTE — Evaluation (Signed)
Occupational Therapy Evaluation Patient Details Name: Angel [name] MRN: [id number] DOB: 26-Sep-1950 Today's Date: 03/25/2023   History of Present Illness Mr. Zuhlke is a 72 yr old male brought to the hospital with constipation and abdominal pain. He was found to have severe anemia and ileus. PMH: HTN, lymphedema, oral/tongue cancer s/p radiation, thymic carcinoma s/p resection   Clinical Impression   The pt performed all assessed tasks with supervision or better, including bed mobility, lower body dressing, sit to stand, and ambulating in his room without an assistive device. He also reported getting up to the bathroom without the need for assistance on several instances during this hospital. He did report having 8/10 upper abdominal pain with progressive activity (nurse informed), therefore is overall activity tolerance was compromised due to this. OT anticipates once his pain is controlled, he will be back at his independent prior level of functioning for ADLs. All needed OT education and/or intervention was provided during the session today. OT will sign off. Anticipate pt will be okay to return home without therapy services upon hospital discharge.        If plan is discharge home, recommend the following: Assist for transportation;Assistance with cooking/housework    Functional Status Assessment  Patient has not had a recent decline in their functional status  Equipment Recommendations  None recommended by OT    Recommendations for Other Services       Precautions / Restrictions Restrictions Weight Bearing Restrictions: No      Mobility Bed Mobility Overal bed mobility: Modified Independent       Supine to sit: Modified independent (Device/Increase time) Sit to supine: Modified independent (Device/Increase time)        Transfers Overall transfer level: Needs assistance Equipment used: None Transfers: Sit to/from Stand Sit to Stand: Supervision                   Balance Overall balance assessment: Mild deficits observed, not formally tested           ADL either performed or assessed with clinical judgement   ADL Overall ADL's : Independent Eating/Feeding: Independent;Sitting   Grooming: Supervision/safety;Standing           Upper Body Dressing : Set up;Sitting Upper Body Dressing Details (indicate cue type and reason): simulated Lower Body Dressing: Set up Lower Body Dressing Details (indicate cue type and reason): he doffed then donned his sock seated EOb Toilet Transfer: Supervision/safety;Ambulation Toilet Transfer Details (indicate cue type and reason): at bathroom level, based on clinical judgement Toileting- Clothing Manipulation and Hygiene: Supervision/safety Toileting - Clothing Manipulation Details (indicate cue type and reason): at bathroom level       General ADL Comments: Pt mostly required supervision just for added precaution, due to reports of increased upper abdominal pain with activity as well as feelings of slight shortness of breath     Vision   Additional Comments: He correctly read the time depicted on the wall clock.            Pertinent Vitals/Pain Pain Assessment Pain Assessment: 0-10 Pain Score: 8  Pain Location: He denied having pain at rest, however reported 8/10 upper abdominal pain with activity. Pain Intervention(s): Patient requesting pain meds-RN notified, Monitored during session     Extremity/Trunk Assessment Upper Extremity Assessment Upper Extremity Assessment: Overall WFL for tasks assessed;Right hand dominant   Lower Extremity Assessment Lower Extremity Assessment: Overall WFL for tasks assessed       Communication Communication Communication: No apparent difficulties  Cognition Arousal: Alert          General Comments: Oriented x4, able to follow commands without difficulty                Home Living Family/patient expects to be discharged to:: Private  residence Living Arrangements: Spouse/significant other   Type of Home: Nutritional therapist of Steps: 3 Entrance Stairs-Rails: Left Home Layout: One level     Bathroom Shower/Tub: Tub/shower unit         Home Equipment: Cane - single Market researcher (2 wheels);Wheelchair - manual;BSC/3in1;Shower seat          Prior Functioning/Environment Prior Level of Function : Independent/Modified Independent;Driving             Mobility Comments: He was independent with ambulation. ADLs Comments: He was independent with ADLs and he shared cooking and cleaning with spouse.        OT Problem List: Pain      OT Treatment/Interventions:  (No further treatment needs identified)    OT Goals(Current goals can be found in the care plan section)    OT Frequency:  (N/A)       AM-PAC OT "6 Clicks" Daily Activity     Outcome Measure Help from another person eating meals?: None Help from another person taking care of personal grooming?: None Help from another person toileting, which includes using toliet, bedpan, or urinal?: None Help from another person bathing (including washing, rinsing, drying)?: A Little Help from another person to put on and taking off regular upper body clothing?: None Help from another person to put on and taking off regular lower body clothing?: None 6 Click Score: 23   End of Session Equipment Utilized During Treatment: Other (comment) (None) Nurse Communication: Patient requests pain meds  Activity Tolerance: Patient limited by pain Patient left: in bed;with call bell/phone within reach;with bed alarm set  OT Visit Diagnosis: Pain Pain - part of body:  (abdomen)                Time: 1040-1055 OT Time Calculation (min): 15 min Charges:  OT General Charges $OT Visit: 1 Visit OT Evaluation $OT Eval Low Complexity: 1 Low    Anthea Udovich L Deziya Amero, OTR/L 03/25/2023, 1:15 PM

## 2023-03-25 NOTE — Progress Notes (Unsigned)
Sent email to pathology to add PD-L1 to  surgical pathology case: [id number].

## 2023-03-25 NOTE — Progress Notes (Signed)
PROGRESS NOTE    Angel [name]  OZH:[id number] DOB: 06-06-1950 DOA: 03/24/2023 PCP: Linus Galas, NP    Chief Complaint  Patient presents with   Constipation    Brief Narrative:  Patient 72 year old gentleman history of tongue cancer status post left neck dissection 09/2013, thymus cancer status postresection/thymectomy 03/03/2023, hypertension, presented to the ED with abdominal pain and constipation ongoing post thymectomy.  Patient noted not to have had a bowel movement 4 days prior to admission and complained of significant abdominal pain.  Due to persistent pain patient presented to the ED.  Patient admitted for constipation.  Workup concerning for anemia, FOBT done was positive, GI was also consulted for patient's constipation as well as heme positive stools.  Patient placed on laxatives with bowel movement and some clinical improvement.  Hemoglobin noted to have trended down to 7.2.  Hematology/oncology and GI consulted.  Patient transfused a unit of PRBCs.  Patient seen in consultation by GI and patient for EGD/colonoscopy tomorrow 03/26/2023.   Assessment & Plan:   Principal Problem:   Abdominal pain Active Problems:   Anemia   Thrombocytosis   Mediastinal mass   Penile ulcer   Marijuana abuse   Thymic carcinoma (HCC)   Ileus (HCC)   #1 abdominal pain secondary to constipation -Patient with complaints of abdominal pain, noted to be constipated ever since thymectomy and over the past 4 days prior to admission with no significant bowel movement.  Patient discontinued his opiates due to worsening constipation. -Patient seen in the ED, CT abdomen and pelvis done with findings concerning for adynamic ileus versus enterocolitis, noted to have sigmoid diverticulosis and chronic diffuse pancreatic duct dilatation up to 0.5 cm in diameter.  No discrete pancreatic mass noted.  Nonemergent outpatient MRI MRCP recommended. -Patient placed on laxatives with bowel movements and some  clinical improvement however still complains of upper abdominal/epigastric pain. -Patient seen by GI and patient for EGD/colonoscopy tomorrow 03/26/2023. -Continue bowel regimen. -Patient to undergo bowel prep for colonoscopy tomorrow. -Clear liquids and n.p.o. after midnight. -Repeat GI input and recommendations.  2.  Iron deficiency anemia -Presented abdominal pain, noted on exam to have some epigastric abdominal pain, hemoglobin at 7.2 this morning from 8.4 on admission.  -FOBT positive.   -Anemia panel with a iron of 16, TIBC of 403, ferritin of 22, folate of 24.3, vitamin B12 of 355. -1 unit PRBCs ordered for transfusion per hematology/oncology this morning. -Follow H&H. -Patient seen in consultation by GI and patient for EGD/colonoscopy tomorrow.  3.  Penile ulcer -Patient noted to have a left shaft 7Q induration with ulceration/scabbing on top. -RPR ordered and pending. -States had similar penile ulcer in the past which was assessed and managed by dermatology, Dr. Margo Aye. -If RPR negative will need to follow-up in the outpatient setting with dermatology, Dr. Margo Aye as previously done.  4.  Thrombocytosis -Chronic. -Hematology/oncology following.  5.  Incidental pancreatic duct dilatation -Will need outpatient MRI and follow-up done.   DVT prophylaxis: SCDs Code Status: Full Family Communication: Updated patient, sister at bedside. Disposition: Likely home when clinically improved.  Status is: Inpatient The patient will require care spanning > 2 midnights and should be moved to inpatient because: Severity of illness   Consultants:  Hematology/oncology: Dr.Gorsuch 03/25/2023 Gastroenterology: Dr. Elnoria Howard 03/25/2023  Procedures:  Transfusion 1 unit PRBCs 03/25/2023 CT abdomen and pelvis 03/24/2023   Antimicrobials: Anti-infectives (From admission, onward)    None         Subjective: Patient sitting up at  her side of the bed.  Stated abdominal pain had improved this  morning after having multiple loose watery stools.  States starting to have some upper abdominal pain coming back.  Denies any overt bleeding.  No chest pain.  No shortness of breath.  States she has sore gastroenterologist and is planning for EGD tomorrow.  Objective: Vitals:   03/25/23 0556 03/25/23 1000 03/25/23 1130 03/25/23 1156  BP: (!) 148/75 138/72 (!) 164/84 (!) 159/80  Pulse: 71 81 76 78  Resp: 18 18 15 15   Temp: 97.8 F (36.6 C) 98 F (36.7 C) 98 F (36.7 C) 98.1 F (36.7 C)  TempSrc:  Oral Oral Oral  SpO2: 98% 99% 95% 97%  Weight:      Height:        Intake/Output Summary (Last 24 hours) at 03/25/2023 1340 Last data filed at 03/25/2023 1000 Gross per 24 hour  Intake 523 ml  Output --  Net 523 ml   Filed Weights   03/24/23 1503  Weight: 55.8 kg    Examination:  General exam: Appears calm and comfortable  Respiratory system: Clear to auscultation. Respiratory effort normal. Cardiovascular system: S1 & S2 heard, RRR. No JVD, murmurs, rubs, gallops or clicks. No pedal edema. Gastrointestinal system: Abdomen is soft, nondistended, some TTP epigastrium.  No rebound, no guarding.  Positive bowel sounds. Central nervous system: Alert and oriented. No focal neurological deficits. Extremities: Symmetric 5 x 5 power. Skin: No rashes, lesions or ulcers Psychiatry: Judgement and insight appear normal. Mood & affect appropriate.     Data Reviewed: I have personally reviewed following labs and imaging studies  CBC: Recent Labs  Lab 03/24/23 1608 03/25/23 0437  WBC 10.1 8.3  NEUTROABS 8.1*  --   HGB 8.4* 7.2*  HCT 29.5* 26.1*  MCV 79.7* 80.3  PLT 701* 573*    Basic Metabolic Panel: Recent Labs  Lab 03/24/23 1608 03/25/23 0437  NA 135 136  K 4.1 3.7  CL 104 105  CO2 21* 25  GLUCOSE 116* 97  BUN 10 14  CREATININE 1.01 0.89  CALCIUM 9.0 8.4*  MG  --  2.5*  PHOS  --  2.7    GFR: Estimated Creatinine Clearance: 61 mL/min (by C-G formula based on SCr  of 0.89 mg/dL).  Liver Function Tests: Recent Labs  Lab 03/24/23 1608  AST 21  ALT 14  ALKPHOS 59  BILITOT 0.6  PROT 7.3  ALBUMIN 3.9    CBG: No results for input(s): "GLUCAP" in the last 168 hours.   No results found for this or any previous visit (from the past 240 hour(s)).       Radiology Studies: CT ABDOMEN PELVIS W CONTRAST  Result Date: 03/24/2023 CLINICAL DATA:  Generalized abdominal pain, most prominent in the left lower quadrant. Nausea. Constipation. EXAM: CT ABDOMEN AND PELVIS WITH CONTRAST TECHNIQUE: Multidetector CT imaging of the abdomen and pelvis was performed using the standard protocol following bolus administration of intravenous contrast. RADIATION DOSE REDUCTION: This exam was performed according to the departmental dose-optimization program which includes automated exposure control, adjustment of the mA and/or kV according to patient size and/or use of iterative reconstruction technique. CONTRAST:  OMNIPAQUE IOHEXOL 300 MG/ML  SOLN COMPARISON:  08/25/2015 CT abdomen/pelvis. FINDINGS: Lower chest: Trace layering left pleural effusion. Mild elevation of the left hemidiaphragm with platelike mild-to-moderate left lung base atelectasis. Hepatobiliary: Normal liver size. No liver mass. Normal gallbladder with no radiopaque cholelithiasis. No biliary ductal dilatation. Pancreas: Chronic diffuse pancreatic  duct dilation up to 0.5 cm diameter, increased from 0.3 cm on 08/25/2015 CT. No discrete pancreatic mass. No pancreatic parenchymal calcifications or peripancreatic inflammatory changes. Spleen: Normal size. No mass. Adrenals/Urinary Tract: Normal adrenals. Normal kidneys with no hydronephrosis and no renal mass. Normal bladder. Stomach/Bowel: Normal non-distended stomach. Generalized mildly prominent fluid-filled small bowel loops measuring up to 3.3 cm diameter in the left abdomen. No discrete small bowel caliber transition. No small bowel wall thickening. Normal  appendix. Fluid levels throughout right, transverse and descending colon. Minimal sigmoid diverticulosis with no large bowel wall thickening or significant pericolonic fat stranding. Vascular/Lymphatic: Atherosclerotic nonaneurysmal abdominal aorta. Patent portal, splenic, hepatic and renal veins. No pathologically enlarged lymph nodes in the abdomen or pelvis. Reproductive: Normal size prostate. Other: No pneumoperitoneum, ascites or focal fluid collection. Surgical clips noted in the left inguinal and ventral left lower quadrant region. No evidence of ventral or inguinal hernia. Musculoskeletal: No aggressive appearing focal osseous lesions. Marked thoracolumbar spondylosis. IMPRESSION: 1. Generalized mildly prominent fluid-filled small bowel loops without discrete small bowel caliber transition. Fluid levels throughout the right, transverse and descending colon. No bowel wall thickening. Findings are nonspecific but most suggestive of mild adynamic ileus such as due to enterocolitis. 2. Minimal sigmoid diverticulosis. 3. Chronic diffuse pancreatic duct dilation up to 0.5 cm diameter, mildly increased from 0.3 cm on 08/25/2015 CT. No discrete pancreatic mass. Suggest nonemergent outpatient MRI abdomen with MRCP without and with IV contrast for further evaluation. 4. Trace layering left pleural effusion. Mild elevation of the left hemidiaphragm with platelike mild-to-moderate left lung base atelectasis. 5.  Aortic Atherosclerosis (ICD10-I70.0). Electronically Signed   By: Delbert Phenix M.D.   On: 03/24/2023 18:16        Scheduled Meds:  cholecalciferol  2,000 Units Oral Daily   gabapentin  300 mg Oral QHS   [START ON 03/26/2023] levothyroxine  88 mcg Oral Q0600   losartan  25 mg Oral QPM   multivitamin with minerals  1 tablet Oral Daily   pantoprazole  40 mg Oral Daily   polyethylene glycol  17 g Oral Daily   polyethylene glycol-electrolytes  4,000 mL Oral Once   rosuvastatin  20 mg Oral Daily    senna-docusate  2 tablet Oral BID   sodium chloride flush  3 mL Intravenous Q12H   Continuous Infusions:   LOS: 0 days    Time spent: 40 minutes    Ramiro Harvest, MD [facility]   To contact the attending provider between 7A-7P or the covering provider during after hours 7P-7A, please log into the web site [email or web address] and access using universal [place] password for that web site. If you do not have the password, please call the hospital operator.  03/25/2023, 1:40 PM
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
## 2023-03-25 NOTE — TOC Initial Note (Signed)
Transition of Care [facility]) - Initial/Assessment Note    Patient Details  Name: Angel [name] MRN: [id number] Date of Birth: [birth year]  Transition of Care [facility]) CM/SW Contact:    Howell Rucks, RN Phone Number: 03/25/2023, 10:18 AM  Clinical Narrative:  Met with pt at bedside to introduce role of TOC/NCM and review for dc planning, pt reports he has an established PCP and pharmacy, no current home care services, reports he has lots of home DME ( walker, BSC, shower chair), reports he feels safe returning home with support from his spouse, confirmed transportation is available at discharge. PT eval, await recommendation. MOON completed. TOC will continue to follow.                  Expected Discharge Plan: Home/Self Care Barriers to Discharge: Continued Medical Work up   Patient Goals and CMS Choice Patient states their goals for this hospitalization and ongoing recovery are:: return home w/spouse          Expected Discharge Plan and Services       Living arrangements for the past 2 months: Single Family Home                                      Prior Living Arrangements/Services Living arrangements for the past 2 months: Single Family Home Lives with:: Spouse Patient language and need for interpreter reviewed:: Yes Do you feel safe going back to the place where you live?: Yes      Need for Family Participation in Patient Care: Yes (Comment) Care giver support system in place?: Yes (comment) Current home services: DME (reports she has lots of home DME- walker, shower chair, BSC) Criminal Activity/Legal Involvement Pertinent to Current Situation/Hospitalization: No - Comment as needed  Activities of Daily Living   ADL Screening (condition at time of admission) Independently performs ADLs?: No Does the patient have a NEW difficulty with bathing/dressing/toileting/self-feeding that is expected to last >3 days?: Yes (Initiates electronic notice to provider for  possible OT consult) Does the patient have a NEW difficulty with getting in/out of bed, walking, or climbing stairs that is expected to last >3 days?: Yes (Initiates electronic notice to provider for possible PT consult) Does the patient have a NEW difficulty with communication that is expected to last >3 days?: No Is the patient deaf or have difficulty hearing?: No Does the patient have difficulty seeing, even when wearing glasses/contacts?: No Does the patient have difficulty concentrating, remembering, or making decisions?: No  Permission Sought/Granted                  Emotional Assessment Appearance:: Appears stated age Attitude/Demeanor/Rapport: Gracious Affect (typically observed): Accepting Orientation: : Oriented to Self, Oriented to Place, Oriented to  Time, Oriented to Situation Alcohol / Substance Use: Not Applicable Psych Involvement: No (comment)  Admission diagnosis:  Ileus (HCC) [K56.7] Occult blood positive stool [R19.5] Dilated pancreatic duct [K86.89] Abdominal pain [R10.9] Patient Active Problem List   Diagnosis Date Noted   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   Ileus (HCC) 03/25/2023   Abdominal pain 03/24/2023   Penile ulcer 03/24/2023   Marijuana abuse 03/24/2023   Mediastinal mass 03/03/2023   Claudication in peripheral vascular disease (HCC) 11/29/2022   Syncope and collapse 10/22/2022   Loop implantation:  [employer] Assert-IQ 3 Loop recorder 10/22/22 Serial # [id number] 10/22/2022   Acquired hypothyroidism 02/14/2017   Essential hypertension 02/14/2017  Lymphedema 05/05/2014   Headache 01/25/2014   Preventive measure 01/01/2014   Diarrhea 11/15/2013   Bilateral leg edema 11/15/2013   Insomnia 11/12/2013   Hypomagnesemia 11/12/2013   Generalized weakness 10/27/2013   Fall 10/27/2013   Thrombocytosis 10/27/2013   Leukocytosis 10/25/2013   Anemia 10/05/2013   Cancer of base of tongue (HCC) 09/17/2013   PCP:  Linus Galas, NP Pharmacy:   [facility] [address], [place] - [address] [place] [address] Phone: [phone] Fax: [phone]     Social Determinants of Health (SDOH) Social History: SDOH Screenings   Food Insecurity: No Food Insecurity (03/24/2023)  Housing: Low Risk  (03/24/2023)  Transportation Needs: No Transportation Needs (03/24/2023)  Utilities: Not At Risk (03/24/2023)  Tobacco Use: Medium Risk (03/24/2023)   SDOH Interventions:     Readmission Risk Interventions    03/07/2023    1:51 PM  Readmission Risk Prevention Plan  Transportation Screening Complete  Home Care Screening Complete  Medication Review (RN CM) Complete
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
## 2023-03-25 NOTE — Care Management Obs Status (Signed)
MEDICARE OBSERVATION STATUS NOTIFICATION   Patient Details  Name: Angel [name] MRN: [id number] Date of Birth: Jan 07, 1951   Medicare Observation Status Notification Given:       Howell Rucks, RN 03/25/2023, 10:14 AM

## 2023-03-25 NOTE — Progress Notes (Signed)
PT Cancellation Note  Patient Details Name: Angel [name] MRN: [id number] DOB: 1950-11-30   Cancelled Treatment:    Reason Eval/Treat Not Completed: PT screened, no needs identified, will sign off  Patient up ad lib , in [place] . Blanchard Kelch PT Acute Rehabilitation Services Office [phone] Weekend [phone]  Rada Hay 03/25/2023, 3:16 PM

## 2023-03-25 NOTE — Telephone Encounter (Signed)
Pt is still in hospital

## 2023-03-26 ENCOUNTER — Encounter (HOSPITAL_COMMUNITY): Payer: Self-pay | Admitting: Internal Medicine

## 2023-03-26 ENCOUNTER — Inpatient Hospital Stay (HOSPITAL_COMMUNITY): Payer: Medicare HMO | Admitting: Anesthesiology

## 2023-03-26 ENCOUNTER — Encounter (HOSPITAL_COMMUNITY)
Admission: EM | Disposition: A | Discharge status: Home / Self Care | Payer: Self-pay | Source: Home / Self Care | Attending: Internal Medicine

## 2023-03-26 DIAGNOSIS — K269 Duodenal ulcer, unspecified as acute or chronic, without hemorrhage or perforation: Secondary | ICD-10-CM

## 2023-03-26 DIAGNOSIS — R195 Other fecal abnormalities: Secondary | ICD-10-CM

## 2023-03-26 DIAGNOSIS — D509 Iron deficiency anemia, unspecified: Secondary | ICD-10-CM | POA: Diagnosis not present

## 2023-03-26 DIAGNOSIS — K449 Diaphragmatic hernia without obstruction or gangrene: Secondary | ICD-10-CM

## 2023-03-26 DIAGNOSIS — C37 Malignant neoplasm of thymus: Secondary | ICD-10-CM | POA: Diagnosis not present

## 2023-03-26 DIAGNOSIS — D649 Anemia, unspecified: Secondary | ICD-10-CM | POA: Diagnosis not present

## 2023-03-26 DIAGNOSIS — K5730 Diverticulosis of large intestine without perforation or abscess without bleeding: Secondary | ICD-10-CM

## 2023-03-26 HISTORY — PX: BIOPSY: SHX5522

## 2023-03-26 HISTORY — PX: COLONOSCOPY WITH PROPOFOL: SHX5780

## 2023-03-26 HISTORY — PX: ESOPHAGOGASTRODUODENOSCOPY (EGD) WITH PROPOFOL: SHX5813

## 2023-03-26 LAB — CBC
HCT: 30.8 % — ABNORMAL LOW (ref 39.0–52.0)
Hemoglobin: 9.2 g/dL — ABNORMAL LOW (ref 13.0–17.0)
MCH: 24 pg — ABNORMAL LOW (ref 26.0–34.0)
MCHC: 29.9 g/dL — ABNORMAL LOW (ref 30.0–36.0)
MCV: 80.2 fL (ref 80.0–100.0)
Platelets: 537 10*3/uL — ABNORMAL HIGH (ref 150–400)
RBC: 3.84 MIL/uL — ABNORMAL LOW (ref 4.22–5.81)
RDW: 17.9 % — ABNORMAL HIGH (ref 11.5–15.5)
WBC: 8.5 10*3/uL (ref 4.0–10.5)
nRBC: 0 % (ref 0.0–0.2)

## 2023-03-26 LAB — BASIC METABOLIC PANEL
Anion gap: 9 (ref 5–15)
BUN: 12 mg/dL (ref 8–23)
CO2: 26 mmol/L (ref 22–32)
Calcium: 8.6 mg/dL — ABNORMAL LOW (ref 8.9–10.3)
Chloride: 106 mmol/L (ref 98–111)
Creatinine, Ser: 0.88 mg/dL (ref 0.61–1.24)
GFR, Estimated: >60 mL/min (ref >60)
Glucose, Bld: 80 mg/dL (ref 70–99)
Potassium: 3.6 mmol/L (ref 3.5–5.1)
Sodium: 141 mmol/L (ref 135–145)

## 2023-03-26 LAB — HAPTOGLOBIN: Haptoglobin: 261 mg/dL (ref 32–363)

## 2023-03-26 SURGERY — ESOPHAGOGASTRODUODENOSCOPY (EGD) WITH PROPOFOL
Anesthesia: Monitor Anesthesia Care

## 2023-03-26 MED ORDER — CLOPIDOGREL BISULFATE 75 MG PO TABS: 75.0000 mg | ORAL_TABLET | Freq: Every day | ORAL | Status: DC

## 2023-03-26 MED ORDER — PROPOFOL 1000 MG/100ML IV EMUL
INTRAVENOUS | Status: AC
  Filled 2023-03-26: qty 100

## 2023-03-26 MED ORDER — VITAMIN B-12 1000 MCG PO TABS: 1000.0000 ug | ORAL_TABLET | Freq: Every day | ORAL | 0 refills | Status: DC

## 2023-03-26 MED ORDER — ALBUTEROL SULFATE HFA 108 (90 BASE) MCG/ACT IN AERS: 2.0000 | INHALATION_SPRAY | Freq: Four times a day (QID) | RESPIRATORY_TRACT | 2 refills | Status: AC | PRN

## 2023-03-26 MED ORDER — PROPOFOL 500 MG/50ML IV EMUL
INTRAVENOUS | Status: AC
  Filled 2023-03-26: qty 50

## 2023-03-26 MED ORDER — PANTOPRAZOLE SODIUM 40 MG PO TBEC: 40.0000 mg | DELAYED_RELEASE_TABLET | Freq: Two times a day (BID) | ORAL | 0 refills | Status: DC

## 2023-03-26 MED ORDER — TRAMADOL HCL 50 MG PO TABS: 100.0000 mg | ORAL_TABLET | Freq: Four times a day (QID) | ORAL | Status: DC | PRN

## 2023-03-26 MED ORDER — FERROUS SULFATE 325 (65 FE) MG PO TBEC: 325.0000 mg | DELAYED_RELEASE_TABLET | Freq: Two times a day (BID) | ORAL | 3 refills | Status: AC

## 2023-03-26 MED ORDER — SODIUM CHLORIDE 0.9 % IV SOLN: INTRAVENOUS | Status: DC | PRN

## 2023-03-26 MED ORDER — ASPIRIN 81 MG PO TBEC: 81.0000 mg | DELAYED_RELEASE_TABLET | Freq: Every day | ORAL | 0 refills | Status: DC

## 2023-03-26 MED ORDER — GUAIFENESIN ER 600 MG PO TB12: 600.0000 mg | ORAL_TABLET | Freq: Two times a day (BID) | ORAL | 2 refills | Status: DC

## 2023-03-26 MED ORDER — PROPOFOL 500 MG/50ML IV EMUL
INTRAVENOUS | Status: DC | PRN
  Administered 2023-03-26: 120 ug/kg/min via INTRAVENOUS
  Administered 2023-03-26: 10 mg via INTRAVENOUS
  Administered 2023-03-26: 20 mg via INTRAVENOUS

## 2023-03-26 MED ORDER — POLYETHYLENE GLYCOL 3350 17 G PO PACK: 17.0000 g | PACK | Freq: Every day | ORAL | 0 refills | Status: DC

## 2023-03-26 MED ORDER — LIDOCAINE 2% (20 MG/ML) 5 ML SYRINGE
INTRAMUSCULAR | Status: DC | PRN
  Administered 2023-03-26: 100 mg via INTRAVENOUS

## 2023-03-26 MED ORDER — DOCUSATE SODIUM 100 MG PO CAPS: 100.0000 mg | ORAL_CAPSULE | Freq: Two times a day (BID) | ORAL | 0 refills | Status: DC

## 2023-03-26 SURGICAL SUPPLY — 23 items
BLOCK BITE 60FR ADLT L/F BLUE (MISCELLANEOUS) ×2 IMPLANT
ELECT REM PT RETURN 9FT ADLT (ELECTROSURGICAL)
ELECTRODE REM PT RTRN 9FT ADLT (ELECTROSURGICAL) IMPLANT
FCP BXJMBJMB 240X2.8X (CUTTING FORCEPS)
FLOOR PAD 36X40 (MISCELLANEOUS) ×2
FORCEP RJ3 GP 1.8X160 W-NEEDLE (CUTTING FORCEPS) IMPLANT
FORCEPS BIOP RAD 4 LRG CAP 4 (CUTTING FORCEPS) IMPLANT
FORCEPS BXJMBJMB 240X2.8X (CUTTING FORCEPS) IMPLANT
INJECTOR/SNARE I SNARE (MISCELLANEOUS) IMPLANT
LUBRICANT JELLY 4.5OZ STERILE (MISCELLANEOUS) IMPLANT
MANIFOLD NEPTUNE II (INSTRUMENTS) IMPLANT
NDL SCLEROTHERAPY 25GX240 (NEEDLE) IMPLANT
NEEDLE SCLEROTHERAPY 25GX240 (NEEDLE) IMPLANT
PAD FLOOR 36X40 (MISCELLANEOUS) ×2 IMPLANT
PROBE APC STR FIRE (PROBE) IMPLANT
PROBE INJECTION GOLD 7FR (MISCELLANEOUS) IMPLANT
SNARE ROTATE MED OVAL 20MM (MISCELLANEOUS) IMPLANT
SNARE SHORT THROW 13M SML OVAL (MISCELLANEOUS) IMPLANT
SYR 50ML LL SCALE MARK (SYRINGE) IMPLANT
TRAP SPECIMEN MUCOUS 40CC (MISCELLANEOUS) IMPLANT
TUBING ENDO SMARTCAP PENTAX (MISCELLANEOUS) ×4 IMPLANT
TUBING IRRIGATION ENDOGATOR (MISCELLANEOUS) ×2 IMPLANT
WATER STERILE IRR 1000ML POUR (IV SOLUTION) IMPLANT

## 2023-03-26 NOTE — Anesthesia Preprocedure Evaluation (Signed)
Anesthesia Evaluation  Patient identified by MRN, date of birth, ID band Patient awake    Reviewed: Allergy & Precautions, H&P , NPO status , Patient's Chart, lab work & pertinent test results  Airway Mallampati: II  TM Distance: >3 FB Neck ROM: Limited    Dental no notable dental hx.    Pulmonary neg pulmonary ROS, former smoker   Pulmonary exam normal breath sounds clear to auscultation       Cardiovascular hypertension, Normal cardiovascular exam Rhythm:Regular Rate:Normal     Neuro/Psych negative neurological ROS  negative psych ROS   GI/Hepatic negative GI ROS, Neg liver ROS,,,  Endo/Other  Hypothyroidism    Renal/GU negative Renal ROS  negative genitourinary   Musculoskeletal negative musculoskeletal ROS (+)    Abdominal   Peds negative pediatric ROS (+)  Hematology  (+) Blood dyscrasia, anemia   Anesthesia Other Findings   Reproductive/Obstetrics negative OB ROS                             Anesthesia Physical Anesthesia Plan  ASA: 3  Anesthesia Plan: MAC   Post-op Pain Management: Minimal or no pain anticipated   Induction: Intravenous  PONV Risk Score and Plan: 1 and Propofol infusion and Treatment may vary due to age or medical condition  Airway Management Planned: Simple Face Mask  Additional Equipment:   Intra-op Plan:   Post-operative Plan:   Informed Consent: I have reviewed the patients History and Physical, chart, labs and discussed the procedure including the risks, benefits and alternatives for the proposed anesthesia with the patient or authorized representative who has indicated his/her understanding and acceptance.     Dental advisory given  Plan Discussed with: CRNA and Surgeon  Anesthesia Plan Comments:        Anesthesia Quick Evaluation

## 2023-03-26 NOTE — Transfer of Care (Signed)
Immediate Anesthesia Transfer of Care Note  Patient: Angel [name]  Procedure(s) Performed: Procedure(s): ESOPHAGOGASTRODUODENOSCOPY (EGD) WITH PROPOFOL (N/A) COLONOSCOPY WITH PROPOFOL (N/A) BIOPSY  Patient Location: PACU  Anesthesia Type:MAC  Level of Consciousness:  sedated, patient cooperative and responds to stimulation  Airway & Oxygen Therapy:Patient Spontanous Breathing and Patient connected to face mask oxgen  Post-op Assessment:  Report given to PACU RN and Post -op Vital signs reviewed and stable  Post vital signs:  Reviewed and stable  Last Vitals:  Vitals:   03/26/23 0653 03/26/23 1040  BP: (!) 153/81 (!) 163/91  Pulse: 71 75  Resp: 17 15  Temp: 36.8 C 36.7 C  SpO2: 94% 94%    Complications: No apparent anesthesia complications

## 2023-03-26 NOTE — Anesthesia Postprocedure Evaluation (Signed)
Anesthesia Post Note  Patient: Angel [name]  Procedure(s) Performed: ESOPHAGOGASTRODUODENOSCOPY (EGD) WITH PROPOFOL COLONOSCOPY WITH PROPOFOL BIOPSY     Patient location during evaluation: PACU Anesthesia Type: MAC Level of consciousness: awake and alert Pain management: pain level controlled Vital Signs Assessment: post-procedure vital signs reviewed and stable Respiratory status: spontaneous breathing, nonlabored ventilation, respiratory function stable and patient connected to nasal cannula oxygen Cardiovascular status: stable and blood pressure returned to baseline Postop Assessment: no apparent nausea or vomiting Anesthetic complications: no  No notable events documented.  Last Vitals:  Vitals:   03/26/23 1210 03/26/23 1215  BP: (!) 107/56 114/60  Pulse: 67 69  Resp: 20 20  Temp:    SpO2: 93% 92%    Last Pain:  Vitals:   03/26/23 1215  TempSrc:   PainSc: 0-No pain                 Jaeliana Lococo S

## 2023-03-26 NOTE — Interval H&P Note (Signed)
History and Physical Interval Note:  03/26/2023 10:34 AM  Angel [name]  has presented today for surgery, with the diagnosis of Anemia and heme positive stool.  The various methods of treatment have been discussed with the patient and family. After consideration of risks, benefits and other options for treatment, the patient has consented to  Procedure(s): ESOPHAGOGASTRODUODENOSCOPY (EGD) WITH PROPOFOL (N/A) COLONOSCOPY WITH PROPOFOL (N/A) as a surgical intervention.  The patient's history has been reviewed, patient examined, no change in status, stable for surgery.  I have reviewed the patient's chart and labs.  Questions were answered to the patient's satisfaction.     Daleah Coulson D

## 2023-03-26 NOTE — Progress Notes (Signed)
Mobility Specialist - Progress Note   03/26/23 1006  Mobility  Activity Ambulated independently in hallway  Level of Assistance Independent  Assistive Device None  Distance Ambulated (ft) 500 ft  Activity Response Tolerated well  Mobility Referral Yes  $Mobility charge 1 Mobility  Mobility Specialist Start Time (ACUTE ONLY) 0920  Mobility Specialist Stop Time (ACUTE ONLY) [id number]  Mobility Specialist Time Calculation (min) (ACUTE ONLY) 8 min   Pt received in bed and agreeable to mobility. No complaints during session. Pt to EOB after session with all needs met.    [facility]

## 2023-03-26 NOTE — Op Note (Signed)
[facility] Patient Name: Angel [name] Procedure Date: 03/26/2023 MRN: [id number] Attending MD: Jeani Hawking , MD, [id number] Date of Birth: 10-Jul-1950 CSN: [id number] Age: 72 Admit Type: Inpatient Procedure:                Colonoscopy Indications:              Heme positive stool Providers:                Jeani Hawking, MD, Fransisca Connors, Geoffery Lyons,                            Technician Referring MD:              Medicines:                Propofol per Anesthesia Complications:            No immediate complications. Estimated Blood Loss:     Estimated blood loss: none. Procedure:                Pre-Anesthesia Assessment:                           - Prior to the procedure, a History and Physical                            was performed, and patient medications and                            allergies were reviewed. The patient's tolerance of                            previous anesthesia was also reviewed. The risks                            and benefits of the procedure and the sedation                            options and risks were discussed with the patient.                            All questions were answered, and informed consent                            was obtained. Prior Anticoagulants: The patient has                            taken no anticoagulant or antiplatelet agents. ASA                            Grade Assessment: III - A patient with severe                            systemic disease. After reviewing the risks and                            benefits, the  patient was deemed in satisfactory                            condition to undergo the procedure.                           - Sedation was administered by an anesthesia                            professional. Deep sedation was attained.                           After obtaining informed consent, the colonoscope                            was passed under direct vision. Throughout the                             procedure, the patient's blood pressure, pulse, and                            oxygen saturations were monitored continuously. The                            PCF-HQ190L ([id number]) Olympus colonoscope was                            introduced through the anus and advanced to the the                            cecum, identified by appendiceal orifice and                            ileocecal valve. The colonoscopy was extremely                            difficult due to a redundant colon and significant                            looping. Successful completion of the procedure was                            aided by using manual pressure and straightening                            and shortening the scope to obtain bowel loop                            reduction. The patient tolerated the procedure                            well. The quality of the bowel preparation was  evaluated using the BBPS [facility] Bowel Preparation                            Scale) with scores of: Right Colon = 2 (minor                            amount of residual staining, small fragments of                            stool and/or opaque liquid, but mucosa seen well),                            Transverse Colon = 3 (entire mucosa seen well with                            no residual staining, small fragments of stool or                            opaque liquid) and Left Colon = 3 (entire mucosa                            seen well with no residual staining, small                            fragments of stool or opaque liquid). The total                            BBPS score equals 8. The quality of the bowel                            preparation was good. The ileocecal valve,                            appendiceal orifice, and rectum were photographed. Scope In: 11:34:13 AM Scope Out: 11:50:58 AM Scope Withdrawal Time: 0 hours 5 minutes 5 seconds  Total  Procedure Duration: 0 hours 16 minutes 45 seconds  Findings:      Scattered small-mouthed diverticula were found in the sigmoid colon. Impression:               - Diverticulosis in the sigmoid colon.                           - No specimens collected. Moderate Sedation:      Not Applicable - Patient had care per Anesthesia. Recommendation:           - Return patient to hospital ward for ongoing care.                           - Resume regular diet.                           - Continue present medications.                           -  Repeat colonoscopy is not recommended for                            surveillance. Procedure Code(s):        --- Professional ---                           [phone], Colonoscopy, flexible; diagnostic, including                            collection of specimen(s) by brushing or washing,                            when performed (separate procedure) Diagnosis Code(s):        --- Professional ---                           R19.5, Other fecal abnormalities                           K57.30, Diverticulosis of large intestine without                            perforation or abscess without bleeding CPT copyright 2022 [employer]. All rights reserved. The codes documented in this report are preliminary and upon coder review may  be revised to meet current compliance requirements. Jeani Hawking, MD Jeani Hawking, MD 03/26/2023 11:55:23 AM This report has been signed electronically. Number of Addenda: 0

## 2023-03-26 NOTE — Op Note (Signed)
[facility] Patient Name: Angel [name] Procedure Date: 03/26/2023 MRN: [id number] Attending MD: Jeani Hawking , MD, [id number] Date of Birth: [birth year] CSN: [id number] Age: 72 Admit Type: Inpatient Procedure:                Upper GI endoscopy Indications:              Iron deficiency anemia, Heme positive stool Providers:                Jeani Hawking, MD, Fransisca Connors, Geoffery Lyons,                            Technician Referring MD:              Medicines:                Propofol per Anesthesia Complications:            No immediate complications. Estimated Blood Loss:     Estimated blood loss: none. Procedure:                Pre-Anesthesia Assessment:                           - Prior to the procedure, a History and Physical                            was performed, and patient medications and                            allergies were reviewed. The patient's tolerance of                            previous anesthesia was also reviewed. The risks                            and benefits of the procedure and the sedation                            options and risks were discussed with the patient.                            All questions were answered, and informed consent                            was obtained. Prior Anticoagulants: The patient has                            taken no anticoagulant or antiplatelet agents. ASA                            Grade Assessment: III - A patient with severe                            systemic disease. After reviewing the risks and  benefits, the patient was deemed in satisfactory                            condition to undergo the procedure.                           - Sedation was administered by an anesthesia                            professional. Deep sedation was attained.                           After obtaining informed consent, the endoscope was                            passed under  direct vision. Throughout the                            procedure, the patient's blood pressure, pulse, and                            oxygen saturations were monitored continuously. The                            PCF-HQ190L ([id number]) Olympus colonoscope was                            introduced through the mouth, and advanced to the                            second part of duodenum. The upper GI endoscopy was                            technically difficult and complex. The patient                            tolerated the procedure well. Scope In: Scope Out: Findings:      One benign-appearing, intrinsic moderate stenosis was found in the lower       third of the esophagus. This stenosis measured 1 cm (inner diameter) x 2       cm (in length). The stenosis was traversed. This was biopsied with a       cold forceps for disruption of the stenosis.      A 5 cm hiatal hernia was present.      The entire examined stomach was normal. Biopsies were taken with a cold       forceps for Helicobacter pylori testing.      Three non-bleeding cratered and superficial duodenal ulcers with a clean       ulcer base (Forrest Class III) were found in the duodenal bulb. The       largest lesion was 12 mm in largest dimension.      A pill was encountered in the distal esophagus that this was washed       through a stenosis. Gentle tip deflection allowed for passage and  dilation of the benign stricture. The dilation was augmented by using       cold biopsy forceps to disrupt the stenosis. Three clean-based distal       duodenal bulb ulcers were identified. This is the source of the anemia       and heme positive stool. Impression:               - Benign-appearing esophageal stenosis. Biopsied.                           - 5 cm hiatal hernia.                           - Normal stomach. Biopsied.                           - Non-bleeding duodenal ulcers with a clean ulcer                             base (Forrest Class III). Moderate Sedation:      Not Applicable - Patient had care per Anesthesia. Recommendation:           - Return patient to hospital ward for ongoing care.                           - Resume regular diet.                           - Continue present medications.                           - Await pathology results.                           - PPI BID x 1 month.                           - Follow up in the office in 2-4 weeks.                           - Avoid NSAIDs.                           - Signing off. Procedure Code(s):        --- Professional ---                           [phone], Esophagogastroduodenoscopy, flexible,                            transoral; with biopsy, single or multiple Diagnosis Code(s):        --- Professional ---                           K22.2, Esophageal obstruction                           K44.9, Diaphragmatic hernia without obstruction or  gangrene                           K26.9, Duodenal ulcer, unspecified as acute or                            chronic, without hemorrhage or perforation                           D50.9, Iron deficiency anemia, unspecified                           R19.5, Other fecal abnormalities CPT copyright 2022 American [employer] Association. All rights reserved. The codes documented in this report are preliminary and upon coder review may  be revised to meet current compliance requirements. Jeani Hawking, MD Jeani Hawking, MD 03/26/2023 12:04:05 PM This report has been signed electronically. Number of Addenda: 0

## 2023-03-26 NOTE — Progress Notes (Signed)
Angel [name]   DOB:Nov 02, 1950   ZO#:[id number]    ASSESSMENT & PLAN:  Thymic carcinoma status post surgical resection He has appointment to see radiation oncologist next week The patient had complete resection and at this point in time, there is no role for chemotherapy Continue supportive care I will see him next week for further follow-up Appointment is given to the patient   Abdominal pain, ileus, resolving This was preceded by severe constipation Continue conservative management He had multiple bowel movement overnight and felt better Continue conservative approach6   Severe anemia, improved s/p transfusion Multifactorial, component of iron deficiency and recent postop blood loss I recommend blood transfusion support, he received 1 unit of blood on March 25, 2023 He has improved blood count this morning.  He is scheduled for endoscopy evaluation with gastroenterologist prior to discharge   Discharge planning I am hopeful he can be discharged today after his endoscopic procedure I have arranged for outpatient follow-up next week I spoke with his wife and updated her I will sign off  All questions were answered. The patient knows to call the clinic with any problems, questions or concerns.   The total time spent in the appointment was 25 minutes encounter with patients including review of chart and various tests results, discussions about plan of care and coordination of care plan  Artis Delay, MD 03/26/2023 9:37 AM  Subjective:  Patient is seen ambulating with physical therapist on the hallway.  He is doing well.  He feels great.  He felt that blood transfusion might have helped.  He is scheduled for endoscopy evaluation this morning.  Denies abdominal pain or nausea.  We discussed future follow-up appointment  Objective:  Vitals:   03/26/23 0149 03/26/23 0653  BP: (!) 149/77 (!) 153/81  Pulse: 71 71  Resp: 16 17  Temp: 98.4 F (36.9 C) 98.2 F (36.8 C)  SpO2: 94%  94%     Intake/Output Summary (Last 24 hours) at 03/26/2023 9811 Last data filed at 03/26/2023 0736 Gross per 24 hour  Intake 961.33 ml  Output 250 ml  Net 711.33 ml    GENERAL:alert, no distress and comfortable NEURO: alert & oriented x 3 with fluent speech, no focal motor/sensory deficits   Labs:  Recent Labs    02/27/23 1337 03/03/23 1002 03/05/23 0321 03/06/23 0410 03/24/23 1608 03/25/23 0437 03/26/23 0448  NA 141   < > 139   < > 135 136 141  K 4.7   < > 3.6   < > 4.1 3.7 3.6  CL 105   < > 109   < > 104 105 106  CO2 26   < > 23   < > 21* 25 26  GLUCOSE 111*   < > 101*   < > 116* 97 80  BUN 6*   < > 26*   < > 10 14 12   CREATININE 1.07   < > 0.98   < > 1.01 0.89 0.88  CALCIUM 9.8   < > 8.1*   < > 9.0 8.4* 8.6*  GFRNONAA >60   < > >60   < > >60 >60 >60  PROT 6.9  --  5.0*  --  7.3  --   --   ALBUMIN 4.1  --  2.9*  --  3.9  --   --   AST 38  --  28  --  21  --   --   ALT 22  --  14  --  14  --   --   ALKPHOS 71  --  36*  --  59  --   --   BILITOT 0.4  --  0.5  --  0.6  --   --    < > = values in this interval not displayed.    Studies:  CT ABDOMEN PELVIS W CONTRAST  Result Date: 03/24/2023 CLINICAL DATA:  Generalized abdominal pain, most prominent in the left lower quadrant. Nausea. Constipation. EXAM: CT ABDOMEN AND PELVIS WITH CONTRAST TECHNIQUE: Multidetector CT imaging of the abdomen and pelvis was performed using the standard protocol following bolus administration of intravenous contrast. RADIATION DOSE REDUCTION: This exam was performed according to the departmental dose-optimization program which includes automated exposure control, adjustment of the mA and/or kV according to patient size and/or use of iterative reconstruction technique. CONTRAST:  OMNIPAQUE IOHEXOL 300 MG/ML  SOLN COMPARISON:  08/25/2015 CT abdomen/pelvis. FINDINGS: Lower chest: Trace layering left pleural effusion. Mild elevation of the left hemidiaphragm with platelike mild-to-moderate  left lung base atelectasis. Hepatobiliary: Normal liver size. No liver mass. Normal gallbladder with no radiopaque cholelithiasis. No biliary ductal dilatation. Pancreas: Chronic diffuse pancreatic duct dilation up to 0.5 cm diameter, increased from 0.3 cm on 08/25/2015 CT. No discrete pancreatic mass. No pancreatic parenchymal calcifications or peripancreatic inflammatory changes. Spleen: Normal size. No mass. Adrenals/Urinary Tract: Normal adrenals. Normal kidneys with no hydronephrosis and no renal mass. Normal bladder. Stomach/Bowel: Normal non-distended stomach. Generalized mildly prominent fluid-filled small bowel loops measuring up to 3.3 cm diameter in the left abdomen. No discrete small bowel caliber transition. No small bowel wall thickening. Normal appendix. Fluid levels throughout right, transverse and descending colon. Minimal sigmoid diverticulosis with no large bowel wall thickening or significant pericolonic fat stranding. Vascular/Lymphatic: Atherosclerotic nonaneurysmal abdominal aorta. Patent portal, splenic, hepatic and renal veins. No pathologically enlarged lymph nodes in the abdomen or pelvis. Reproductive: Normal size prostate. Other: No pneumoperitoneum, ascites or focal fluid collection. Surgical clips noted in the left inguinal and ventral left lower quadrant region. No evidence of ventral or inguinal hernia. Musculoskeletal: No aggressive appearing focal osseous lesions. Marked thoracolumbar spondylosis. IMPRESSION: 1. Generalized mildly prominent fluid-filled small bowel loops without discrete small bowel caliber transition. Fluid levels throughout the right, transverse and descending colon. No bowel wall thickening. Findings are nonspecific but most suggestive of mild adynamic ileus such as due to enterocolitis. 2. Minimal sigmoid diverticulosis. 3. Chronic diffuse pancreatic duct dilation up to 0.5 cm diameter, mildly increased from 0.3 cm on 08/25/2015 CT. No discrete pancreatic mass.  Suggest nonemergent outpatient MRI abdomen with MRCP without and with IV contrast for further evaluation. 4. Trace layering left pleural effusion. Mild elevation of the left hemidiaphragm with platelike mild-to-moderate left lung base atelectasis. 5.  Aortic Atherosclerosis (ICD10-I70.0). Electronically Signed   By: Delbert Phenix M.D.   On: 03/24/2023 18:16   DG Chest 2 View  Result Date: 03/20/2023 CLINICAL DATA:  Status post surgery for mediastinal mass 1 week ago. EXAM: CHEST - 2 VIEW COMPARISON:  March 07, 2023. FINDINGS: Stable cardiomediastinal silhouette. No pneumothorax is noted currently. Right lung is clear. Left basilar subsegmental atelectasis or scarring is noted. Elevated left hemidiaphragm is noted mild S shaped scoliosis of thoracic spine is noted. Subcutaneous emphysema seen overlying left lateral chest wall on prior exam is significantly decreased. IMPRESSION: Left basilar subsegmental atelectasis or scarring is noted. Electronically Signed   By: Lupita Raider M.D.   On: 03/20/2023 16:25   DG Chest 2  View  Result Date: 03/07/2023 CLINICAL DATA:  Recent left chest tube removal. Follow up pneumothorax. EXAM: CHEST - 2 VIEW COMPARISON:  Radiographs 03/06/2023 and 03/05/2023.  CT 01/23/2023. FINDINGS: Unchanged volume loss in the left hemithorax with medial left lower lobe airspace disease and a probable small left pleural effusion, unchanged. No significant changes small left apical pneumothorax. The right lung has a stable appearance. Persistent evidence of a small right pleural effusion. The heart size and mediastinal contours are stable. Loop recorder noted in the left anterior chest wall. Moderate soft tissue emphysema throughout the left lateral chest wall appears unchanged. A large left-sided cervical rib is noted. IMPRESSION: 1. No significant change in small left apical pneumothorax and left chest wall soft tissue emphysema. 2. Unchanged volume loss in the left hemithorax with  medial left lower lobe airspace disease and probable small left pleural effusion. 3. Unchanged small right pleural effusion. Electronically Signed   By: Carey Bullocks M.D.   On: 03/07/2023 11:27   DG Chest 2 View  Result Date: 03/06/2023 CLINICAL DATA:  [id number] Pneumothorax [id number]. EXAM: CHEST - 2 VIEW COMPARISON:  03/05/2023. FINDINGS: Redemonstration of left retrocardiac airspace opacity obscuring the left hemidiaphragm, descending thoracic aorta and blunting the left lateral costophrenic angle suggesting combination of left lower lobe atelectasis and/or consolidation with pleural effusion. No significant interval change. Redemonstration of small left apical pneumothorax without significant interval change. Redemonstration of heterogeneous opacity overlying the left upper mid lung zones, likely secondary to overlying surgical emphysema which is grossly similar to the prior study. Bilateral lung fields are otherwise clear. There is new blunting of the right lateral costophrenic angle, which may represent trace right pleural effusion. No right pneumothorax. Stable cardio-mediastinal silhouette. Redemonstration of loop recorder device overlying the left anterior chest wall. No acute osseous abnormalities. The soft tissues are within normal limits. IMPRESSION: *Persistent small left apical pneumothorax and surgical emphysema along the left chest wall. *Persistent left retrocardiac opacity, as described above. No significant interval change. *New trace right pleural effusion. Electronically Signed   By: Jules Schick M.D.   On: 03/06/2023 09:21   DG Chest 2 View  Result Date: 03/05/2023 CLINICAL DATA:  Preop for mediastinal mass surgery. EXAM: CHEST - 2 VIEW COMPARISON:  February 27, 2015. FINDINGS: The heart size and mediastinal contours are within normal limits. Both lungs are clear. The visualized skeletal structures are unremarkable. IMPRESSION: No active cardiopulmonary disease. Electronically Signed    By: Lupita Raider M.D.   On: 03/05/2023 17:56   DG Chest 1V REPEAT Same Day  Result Date: 03/05/2023 CLINICAL DATA:  Chest tube removal. EXAM: CHEST - 1 VIEW SAME DAY COMPARISON:  Same day. FINDINGS: Left-sided chest tube has been removed. Stable small left apical pneumothorax is noted. Left rib fractures again noted. Increased subcutaneous emphysema is seen over left lateral chest wall. IMPRESSION: Status post removal of left-sided chest tube. Stable small left apical pneumothorax. Increased subcutaneous emphysema is seen over left lateral chest wall. Electronically Signed   By: Lupita Raider M.D.   On: 03/05/2023 17:55   DG CHEST PORT 1 VIEW  Result Date: 03/05/2023 CLINICAL DATA:  Pleural effusion, chest tube EXAM: PORTABLE CHEST 1 VIEW COMPARISON:  03/04/2023 FINDINGS: Unchanged AP portable chest radiograph. Left-sided chest tube remains in position with minimal, less than 5% left apical pneumothorax and unchanged elevation of the left hemidiaphragm. Right lung is normally aerated. Cardiomegaly. No acute osseous findings. IMPRESSION: 1. Left-sided chest tube remains in position with minimal,  less than 5% left apical pneumothorax and unchanged elevation of the left hemidiaphragm. 2. Cardiomegaly. Electronically Signed   By: Jearld Lesch M.D.   On: 03/05/2023 10:15   DG Chest Port 1 View  Result Date: 03/04/2023 CLINICAL DATA:  Pneumothorax.  Left-sided chest tube. EXAM: PORTABLE CHEST 1 VIEW COMPARISON:  March 03, 2023. FINDINGS: Stable cardiomediastinal silhouette. Left-sided chest tube is noted without pneumothorax. Right lung is clear. Right internal jugular catheter is unchanged. Elevated left hemidiaphragm is noted with minimal left basilar subsegmental atelectasis. Stable left rib fracture. Mild subcutaneous emphysema is seen over left lateral chest wall. IMPRESSION: Left-sided chest tube is noted without pneumothorax. Electronically Signed   By: Lupita Raider M.D.   On: 03/04/2023  10:26   DG Chest Port 1 View  Result Date: 03/03/2023 CLINICAL DATA:  [id number] Pneumothorax [id number] cough, postoperative, chest tube EXAM: PORTABLE CHEST 1 VIEW COMPARISON:  02/27/2023 chest radiograph. FINDINGS: Right internal jugular central venous catheter terminates in the middle third of the SVC. Left apical chest tube in place. Loop recorder overlies the left mid chest. Stable cardiomediastinal silhouette with normal heart size. No pneumothorax. No pleural effusion. Low lung volumes. Mild subcutaneous emphysema in the lateral left chest wall. Lateral left seventh rib fracture is mildly displaced and over riding, presumably related to thoracotomy. No overt pulmonary edema. Streaky mild-to-moderate left basilar atelectasis. IMPRESSION: 1. Left apical chest tube in place. No pneumothorax. 2. Low lung volumes with streaky mild-to-moderate left basilar atelectasis. 3. Lateral left seventh rib fracture, presumably related to thoracotomy. Electronically Signed   By: Delbert Phenix M.D.   On: 03/03/2023 16:27

## 2023-03-28 ENCOUNTER — Ambulatory Visit (INDEPENDENT_AMBULATORY_CARE_PROVIDER_SITE_OTHER): Payer: Medicare HMO

## 2023-03-28 ENCOUNTER — Encounter (HOSPITAL_COMMUNITY): Payer: Self-pay | Admitting: Gastroenterology

## 2023-03-28 ENCOUNTER — Other Ambulatory Visit: Payer: Medicare HMO

## 2023-03-28 DIAGNOSIS — R55 Syncope and collapse: Secondary | ICD-10-CM

## 2023-03-28 LAB — CUP PACEART REMOTE DEVICE CHECK
Date Time Interrogation Session: 20241128020059
Implantable Pulse Generator Implant Date: 20240625
Pulse Gen Serial Number: 511029626

## 2023-03-28 LAB — TYPE AND SCREEN
ABO/RH(D): A POS
Antibody Screen: NEGATIVE
Unit division: 0

## 2023-03-28 LAB — BPAM RBC
Blood Product Expiration Date: 202412162359
ISSUE DATE / TIME: 202411261135
Unit Type and Rh: 6200

## 2023-03-28 NOTE — Discharge Summary (Signed)
Physician Discharge Summary   Patient: Angel [name] MRN: [id number] DOB: [birth year]  Admit date:     03/24/2023  Discharge date: 03/26/2023  Discharge Physician: Lynden Oxford  PCP: Linus Galas, NP  Recommendations at discharge: Follow-up with PCP.  Follow-up with hematology.  Follow-up with GI as recommended. Repeat CBC with hematology.   Follow-up Information     Linus Galas, NP. Schedule an appointment as soon as possible for a visit in 1 week(s).   Contact information: [address] [place] [address] [place] [address] [phone]         Artis Delay, MD. Schedule an appointment as soon as possible for a visit in 1 week(s).   Specialty: Hematology and Oncology Why: with CBC lab to look at blood counts Contact information: [address] [place] [address] [phone]         Jeani Hawking, MD. Schedule an appointment as soon as possible for a visit in 3 week(s).   Specialty: Gastroenterology Contact information: [address] [place] [address] [phone]                Discharge Diagnoses: Principal Problem:   Abdominal pain Active Problems:   Anemia   Thrombocytosis   Mediastinal mass   Penile ulcer   Marijuana abuse   Thymic carcinoma (HCC)   Ileus (HCC)   Dilated pancreatic duct  Hospital Course: Patient 72 year old gentleman history of tongue cancer status post left neck dissection 09/2013, thymus cancer status postresection/thymectomy 03/03/2023, hypertension, presented to the ED with abdominal pain and constipation ongoing post thymectomy.  Patient noted not to have had a bowel movement 4 days prior to admission and complained of significant abdominal pain.  Due to persistent pain patient presented to the ED.  Patient admitted for constipation.  Workup concerning for anemia, FOBT done was positive, GI was also consulted for patient's constipation as well as heme positive stools.  Patient placed on  laxatives with bowel movement and some clinical improvement.  Hemoglobin noted to have trended down to 7.2.  Hematology/oncology and GI consulted.  Patient transfused a unit of PRBCs.  Underwent EGD colonoscopy.  Duodenal ulcer was seen without any active bleeding.  GI recommended the patient can be discharged from their perspective.  Abdominal pain secondary to constipation/adynamic ileus. No evidence of enterocolitis. Presented with abdominal pain.  CT abdomen shows possible adynamic ileus versus enterocolitis. Initiated on bowel regimen with resolution of abdominal pain.  Tolerating oral diet.   Chronic iron deficiency anemia Nonbleeding duodenal ulcer -Presented abdominal pain, noted on exam to have some epigastric abdominal pain,  Hemoglobin dropped and FOBT was positive. Iron level 16. Received 1 PRBC transfusion. Hematology was consulted. GI was consulted. EGD showed evidence of large duodenal ulcer. No active bleeding seen. Continue PPI twice daily. GI recommended to initiate aspirin Plavix on Sunday. Avoid NSAIDs.  Penile ulcer -Patient noted to have a left shaft 7Q induration with ulceration/scabbing on top. -RPR nonreactive. -States had similar penile ulcer in the past which was assessed and managed by dermatology, Dr. Margo Aye. Follow-up with dermatology, Dr. Margo Aye as previously done.   Thrombocytosis -Chronic. -Hematology/oncology following.   Incidental pancreatic duct dilatation -Will need outpatient MRI and follow-up done..  Patient will follow-up with GI outpatient.  Emphysema. Undiagnosed COPD. On my clinical exam patient has bilateral expiratory wheezing. Currently not in any respiratory distress. Patient tells me that he feels at his baseline. CT chest on September 2024 showed evidence of moderate centrilobular emphysema as well  as bronchial wall thickening. Suspect he is suffering from undiagnosed COPD. Would recommend to initiate as needed albuterol inhaler  and further workup outpatient with a PFT with PCP.  Consultants:  Hematology [facility]  Procedures performed:  EGD colonoscopy  DISCHARGE MEDICATION: Allergies as of 03/26/2023   No Known Allergies      Medication List     STOP taking these medications    aspirin 81 MG chewable tablet Commonly known as: Aspirin Childrens Replaced by: aspirin EC 81 MG tablet       TAKE these medications    albuterol 108 (90 Base) MCG/ACT inhaler Commonly known as: VENTOLIN HFA Inhale 2 puffs into the lungs every 6 (six) hours as needed for wheezing or shortness of breath.   aspirin EC 81 MG tablet Take 1 tablet (81 mg total) by mouth daily. Swallow whole. Start taking on: March 30, 2023 Replaces: aspirin 81 MG chewable tablet   clopidogrel 75 MG tablet Commonly known as: PLAVIX Take 1 tablet (75 mg total) by mouth daily. Start taking on: March 30, 2023 What changed: These instructions start on March 30, 2023. If you are unsure what to do until then, ask your doctor or other care provider.   cyanocobalamin 1000 MCG tablet Commonly known as: VITAMIN B12 Take 1 tablet (1,000 mcg total) by mouth daily.   docusate sodium 100 MG capsule Commonly known as: Colace Take 1 capsule (100 mg total) by mouth 2 (two) times daily.   ferrous sulfate 325 (65 FE) MG EC tablet Take 1 tablet (325 mg total) by mouth 2 (two) times daily.   gabapentin 300 MG capsule Commonly known as: NEURONTIN Take 1 capsule (300 mg total) by mouth at bedtime.   guaiFENesin 600 MG 12 hr tablet Commonly known as: Mucinex Take 1 tablet (600 mg total) by mouth 2 (two) times daily.   levothyroxine 88 MCG tablet Commonly known as: SYNTHROID Take 88 mcg by mouth daily.   losartan 25 MG tablet Commonly known as: COZAAR Take 1 tablet by mouth in the evening   methocarbamol 500 MG tablet Commonly known as: ROBAXIN Take 500 mg by mouth every 6 (six) hours as needed for muscle spasms.   multivitamin  with minerals Tabs tablet Take 1 tablet by mouth daily. Centrum 50+   oxyCODONE 5 MG immediate release tablet Commonly known as: Oxy IR/ROXICODONE Take 1 tablet (5 mg total) by mouth every 6 (six) hours as needed for severe pain (pain score 7-10).   pantoprazole 40 MG tablet Commonly known as: PROTONIX Take 1 tablet (40 mg total) by mouth 2 (two) times daily before a meal.   polyethylene glycol 17 g packet Commonly known as: MIRALAX / GLYCOLAX Take 17 g by mouth daily.   rosuvastatin 20 MG tablet Commonly known as: CRESTOR Take 1 tablet by mouth once daily   Vitamin D3 50 MCG (2000 UT) Tabs Take 2,000 Units by mouth daily.       Disposition: Home Diet recommendation: Cardiac diet  Discharge Exam: Vitals:   03/26/23 1210 03/26/23 1215 03/26/23 1235 03/26/23 1321  BP: (!) 107/56 114/60 (!) 152/80 (!) 161/84  Pulse: 67 69 67 71  Resp: 20 20 18 16   Temp:   97.6 F (36.4 C) 98.3 F (36.8 C)  TempSrc:   Oral Oral  SpO2: 93% 92% 97% 97%  Weight:      Height:       General: Appear in no distress; no visible Abnormal Neck Mass Or lumps, Conjunctiva normal  Cardiovascular: S1 and S2 Present, no Murmur, Respiratory: good respiratory effort, Bilateral Air entry present and CTA, no Crackles, bilateral wheezes Abdomen: Bowel Sound present, Non tender  Extremities: no Pedal edema Neurology: alert and oriented to time, place, and person  Filed Weights   03/24/23 1503  Weight: 55.8 kg   Condition at discharge: stable  The results of significant diagnostics from this hospitalization (including imaging, microbiology, ancillary and laboratory) are listed below for reference.   Imaging Studies: CT ABDOMEN PELVIS W CONTRAST  Result Date: 03/24/2023 CLINICAL DATA:  Generalized abdominal pain, most prominent in the left lower quadrant. Nausea. Constipation. EXAM: CT ABDOMEN AND PELVIS WITH CONTRAST TECHNIQUE: Multidetector CT imaging of the abdomen and pelvis was performed using  the standard protocol following bolus administration of intravenous contrast. RADIATION DOSE REDUCTION: This exam was performed according to the departmental dose-optimization program which includes automated exposure control, adjustment of the mA and/or kV according to patient size and/or use of iterative reconstruction technique. CONTRAST:  OMNIPAQUE IOHEXOL 300 MG/ML  SOLN COMPARISON:  08/25/2015 CT abdomen/pelvis. FINDINGS: Lower chest: Trace layering left pleural effusion. Mild elevation of the left hemidiaphragm with platelike mild-to-moderate left lung base atelectasis. Hepatobiliary: Normal liver size. No liver mass. Normal gallbladder with no radiopaque cholelithiasis. No biliary ductal dilatation. Pancreas: Chronic diffuse pancreatic duct dilation up to 0.5 cm diameter, increased from 0.3 cm on 08/25/2015 CT. No discrete pancreatic mass. No pancreatic parenchymal calcifications or peripancreatic inflammatory changes. Spleen: Normal size. No mass. Adrenals/Urinary Tract: Normal adrenals. Normal kidneys with no hydronephrosis and no renal mass. Normal bladder. Stomach/Bowel: Normal non-distended stomach. Generalized mildly prominent fluid-filled small bowel loops measuring up to 3.3 cm diameter in the left abdomen. No discrete small bowel caliber transition. No small bowel wall thickening. Normal appendix. Fluid levels throughout right, transverse and descending colon. Minimal sigmoid diverticulosis with no large bowel wall thickening or significant pericolonic fat stranding. Vascular/Lymphatic: Atherosclerotic nonaneurysmal abdominal aorta. Patent portal, splenic, hepatic and renal veins. No pathologically enlarged lymph nodes in the abdomen or pelvis. Reproductive: Normal size prostate. Other: No pneumoperitoneum, ascites or focal fluid collection. Surgical clips noted in the left inguinal and ventral left lower quadrant region. No evidence of ventral or inguinal hernia. Musculoskeletal: No  aggressive appearing focal osseous lesions. Marked thoracolumbar spondylosis. IMPRESSION: 1. Generalized mildly prominent fluid-filled small bowel loops without discrete small bowel caliber transition. Fluid levels throughout the right, transverse and descending colon. No bowel wall thickening. Findings are nonspecific but most suggestive of mild adynamic ileus such as due to enterocolitis. 2. Minimal sigmoid diverticulosis. 3. Chronic diffuse pancreatic duct dilation up to 0.5 cm diameter, mildly increased from 0.3 cm on 08/25/2015 CT. No discrete pancreatic mass. Suggest nonemergent outpatient MRI abdomen with MRCP without and with IV contrast for further evaluation. 4. Trace layering left pleural effusion. Mild elevation of the left hemidiaphragm with platelike mild-to-moderate left lung base atelectasis. 5.  Aortic Atherosclerosis (ICD10-I70.0). Electronically Signed   By: Delbert Phenix M.D.   On: 03/24/2023 18:16   DG Chest 2 View  Result Date: 03/20/2023 CLINICAL DATA:  Status post surgery for mediastinal mass 1 week ago. EXAM: CHEST - 2 VIEW COMPARISON:  March 07, 2023. FINDINGS: Stable cardiomediastinal silhouette. No pneumothorax is noted currently. Right lung is clear. Left basilar subsegmental atelectasis or scarring is noted. Elevated left hemidiaphragm is noted mild S shaped scoliosis of thoracic spine is noted. Subcutaneous emphysema seen overlying left lateral chest wall on prior exam is significantly decreased. IMPRESSION: Left basilar subsegmental  atelectasis or scarring is noted. Electronically Signed   By: Lupita Raider M.D.   On: 03/20/2023 16:25   DG Chest 2 View  Result Date: 03/07/2023 CLINICAL DATA:  Recent left chest tube removal. Follow up pneumothorax. EXAM: CHEST - 2 VIEW COMPARISON:  Radiographs 03/06/2023 and 03/05/2023.  CT 01/23/2023. FINDINGS: Unchanged volume loss in the left hemithorax with medial left lower lobe airspace disease and a probable small left pleural  effusion, unchanged. No significant changes small left apical pneumothorax. The right lung has a stable appearance. Persistent evidence of a small right pleural effusion. The heart size and mediastinal contours are stable. Loop recorder noted in the left anterior chest wall. Moderate soft tissue emphysema throughout the left lateral chest wall appears unchanged. A large left-sided cervical rib is noted. IMPRESSION: 1. No significant change in small left apical pneumothorax and left chest wall soft tissue emphysema. 2. Unchanged volume loss in the left hemithorax with medial left lower lobe airspace disease and probable small left pleural effusion. 3. Unchanged small right pleural effusion. Electronically Signed   By: Carey Bullocks M.D.   On: 03/07/2023 11:27   DG Chest 2 View  Result Date: 03/06/2023 CLINICAL DATA:  [id number] Pneumothorax [id number]. EXAM: CHEST - 2 VIEW COMPARISON:  03/05/2023. FINDINGS: Redemonstration of left retrocardiac airspace opacity obscuring the left hemidiaphragm, descending thoracic aorta and blunting the left lateral costophrenic angle suggesting combination of left lower lobe atelectasis and/or consolidation with pleural effusion. No significant interval change. Redemonstration of small left apical pneumothorax without significant interval change. Redemonstration of heterogeneous opacity overlying the left upper mid lung zones, likely secondary to overlying surgical emphysema which is grossly similar to the prior study. Bilateral lung fields are otherwise clear. There is new blunting of the right lateral costophrenic angle, which may represent trace right pleural effusion. No right pneumothorax. Stable cardio-mediastinal silhouette. Redemonstration of loop recorder device overlying the left anterior chest wall. No acute osseous abnormalities. The soft tissues are within normal limits. IMPRESSION: *Persistent small left apical pneumothorax and surgical emphysema along the left chest  wall. *Persistent left retrocardiac opacity, as described above. No significant interval change. *New trace right pleural effusion. Electronically Signed   By: Jules Schick M.D.   On: 03/06/2023 09:21   DG Chest 2 View  Result Date: 03/05/2023 CLINICAL DATA:  Preop for mediastinal mass surgery. EXAM: CHEST - 2 VIEW COMPARISON:  February 27, 2015. FINDINGS: The heart size and mediastinal contours are within normal limits. Both lungs are clear. The visualized skeletal structures are unremarkable. IMPRESSION: No active cardiopulmonary disease. Electronically Signed   By: Lupita Raider M.D.   On: 03/05/2023 17:56   DG Chest 1V REPEAT Same Day  Result Date: 03/05/2023 CLINICAL DATA:  Chest tube removal. EXAM: CHEST - 1 VIEW SAME DAY COMPARISON:  Same day. FINDINGS: Left-sided chest tube has been removed. Stable small left apical pneumothorax is noted. Left rib fractures again noted. Increased subcutaneous emphysema is seen over left lateral chest wall. IMPRESSION: Status post removal of left-sided chest tube. Stable small left apical pneumothorax. Increased subcutaneous emphysema is seen over left lateral chest wall. Electronically Signed   By: Lupita Raider M.D.   On: 03/05/2023 17:55   DG CHEST PORT 1 VIEW  Result Date: 03/05/2023 CLINICAL DATA:  Pleural effusion, chest tube EXAM: PORTABLE CHEST 1 VIEW COMPARISON:  03/04/2023 FINDINGS: Unchanged AP portable chest radiograph. Left-sided chest tube remains in position with minimal, less than 5% left apical pneumothorax and unchanged  elevation of the left hemidiaphragm. Right lung is normally aerated. Cardiomegaly. No acute osseous findings. IMPRESSION: 1. Left-sided chest tube remains in position with minimal, less than 5% left apical pneumothorax and unchanged elevation of the left hemidiaphragm. 2. Cardiomegaly. Electronically Signed   By: Jearld Lesch M.D.   On: 03/05/2023 10:15   DG Chest Port 1 View  Result Date: 03/04/2023 CLINICAL DATA:   Pneumothorax.  Left-sided chest tube. EXAM: PORTABLE CHEST 1 VIEW COMPARISON:  March 03, 2023. FINDINGS: Stable cardiomediastinal silhouette. Left-sided chest tube is noted without pneumothorax. Right lung is clear. Right internal jugular catheter is unchanged. Elevated left hemidiaphragm is noted with minimal left basilar subsegmental atelectasis. Stable left rib fracture. Mild subcutaneous emphysema is seen over left lateral chest wall. IMPRESSION: Left-sided chest tube is noted without pneumothorax. Electronically Signed   By: Lupita Raider M.D.   On: 03/04/2023 10:26   DG Chest Port 1 View  Result Date: 03/03/2023 CLINICAL DATA:  [id number] Pneumothorax [id number] cough, postoperative, chest tube EXAM: PORTABLE CHEST 1 VIEW COMPARISON:  02/27/2023 chest radiograph. FINDINGS: Right internal jugular central venous catheter terminates in the middle third of the SVC. Left apical chest tube in place. Loop recorder overlies the left mid chest. Stable cardiomediastinal silhouette with normal heart size. No pneumothorax. No pleural effusion. Low lung volumes. Mild subcutaneous emphysema in the lateral left chest wall. Lateral left seventh rib fracture is mildly displaced and over riding, presumably related to thoracotomy. No overt pulmonary edema. Streaky mild-to-moderate left basilar atelectasis. IMPRESSION: 1. Left apical chest tube in place. No pneumothorax. 2. Low lung volumes with streaky mild-to-moderate left basilar atelectasis. 3. Lateral left seventh rib fracture, presumably related to thoracotomy. Electronically Signed   By: Delbert Phenix M.D.   On: 03/03/2023 16:27    Microbiology: Results for orders placed or performed during the hospital encounter of 02/27/23  Surgical pcr screen     Status: None   Collection Time: 02/27/23  1:43 PM   Specimen: Nasal Mucosa; Nasal Swab  Result Value Ref Range Status   MRSA, PCR NEGATIVE NEGATIVE Final   Staphylococcus aureus NEGATIVE NEGATIVE Final    Comment:  (NOTE) The Xpert SA Assay (FDA approved for NASAL specimens in patients 37 years of age and older), is one component of a comprehensive surveillance program. It is not intended to diagnose infection nor to guide or monitor treatment. Performed at [facility], [address], [place], [place] [address]   SARS CORONAVIRUS 2 (TAT 6-24 HRS) Anterior Nasal Swab     Status: None   Collection Time: 02/27/23  1:44 PM   Specimen: Anterior Nasal Swab  Result Value Ref Range Status   SARS Coronavirus 2 NEGATIVE NEGATIVE Final    Comment: (NOTE) SARS-CoV-2 target nucleic acids are NOT DETECTED.  The SARS-CoV-2 RNA is generally detectable in upper and lower respiratory specimens during the acute phase of infection. Negative results do not preclude SARS-CoV-2 infection, do not rule out co-infections with other pathogens, and should not be used as the sole basis for treatment or other patient management decisions. Negative results must be combined with clinical observations, patient history, and epidemiological information. The expected result is Negative.  Fact Sheet for Patients: HairSlick.no  Fact Sheet for Healthcare Providers: quierodirigir.com  This test is not yet approved or cleared by the [place] FDA and  has been authorized for detection and/or diagnosis of SARS-CoV-2 by FDA under an Emergency Use Authorization (EUA). This EUA will remain  in effect (  meaning this test can be used) for the duration of the COVID-19 declaration under Se ction 564(b)(1) of the Act, 21 U.S.C. section 360bbb-3(b)(1), unless the authorization is terminated or revoked sooner.  Performed at [facility], [address] [place], [place] [address]    Labs: CBC: Recent Labs  Lab 03/24/23 1608 03/25/23 0437 03/26/23 0448  WBC 10.1 8.3 8.5  NEUTROABS 8.1*  --   --   HGB 8.4* 7.2* 9.2*  HCT 29.5* 26.1* 30.8*  MCV 79.7* 80.3  80.2  PLT 701* 573* 537*   Basic Metabolic Panel: Recent Labs  Lab 03/24/23 1608 03/25/23 0437 03/26/23 0448  NA 135 136 141  K 4.1 3.7 3.6  CL 104 105 106  CO2 21* 25 26  GLUCOSE 116* 97 80  BUN 10 14 12   CREATININE 1.01 0.89 0.88  CALCIUM 9.0 8.4* 8.6*  MG  --  2.5*  --   PHOS  --  2.7  --    Liver Function Tests: Recent Labs  Lab 03/24/23 1608  AST 21  ALT 14  ALKPHOS 59  BILITOT 0.6  PROT 7.3  ALBUMIN 3.9   CBG: No results for input(s): "GLUCAP" in the last 168 hours.  Discharge time spent: greater than 30 minutes.  Author: Lynden Oxford, MD  [facility] 03/26/2023

## 2023-03-31 ENCOUNTER — Telehealth: Payer: Self-pay | Admitting: Radiation Oncology

## 2023-03-31 LAB — SURGICAL PATHOLOGY

## 2023-03-31 LAB — MOLECULAR PATHOLOGY

## 2023-03-31 NOTE — Telephone Encounter (Signed)
Alert remote transmission: There were 2 tachy episodes detected that were 6 and 21 seconds Tachy rate was 186-190 bpm, possibly SVT.   5 Tachy episodes per counters, 2 EGMs available, longest 14 sec (device defined), irregular R-R intervals, cannot exclude atrial arrhythmia vs atrial driven tachycardia with PACs and non-conducted PACs, V rates 174-191 bpm.  Spoke to patients family who advised patient is asleep but will have him call back when he wakes up.

## 2023-03-31 NOTE — Telephone Encounter (Signed)
12/2 @ 12:52 pm patient's wife called to cancel his appointment for 12/3, will call back after his appt with Dr. Bertis Ruddy, to r/s if needed.

## 2023-04-01 ENCOUNTER — Ambulatory Visit: Payer: Medicare HMO

## 2023-04-01 ENCOUNTER — Ambulatory Visit: Payer: Medicare HMO | Admitting: Radiation Oncology

## 2023-04-02 ENCOUNTER — Other Ambulatory Visit: Payer: Medicare HMO

## 2023-04-02 ENCOUNTER — Inpatient Hospital Stay: Payer: Medicare HMO | Attending: Hematology and Oncology | Admitting: Hematology and Oncology

## 2023-04-02 ENCOUNTER — Encounter: Payer: Self-pay | Admitting: Hematology and Oncology

## 2023-04-02 VITALS — BP 155/85 | HR 82 | Temp 97.6°F | Resp 15 | Wt 116.8 lb

## 2023-04-02 DIAGNOSIS — C37 Malignant neoplasm of thymus: Secondary | ICD-10-CM | POA: Insufficient documentation

## 2023-04-02 DIAGNOSIS — D539 Nutritional anemia, unspecified: Secondary | ICD-10-CM

## 2023-04-02 DIAGNOSIS — Z87891 Personal history of nicotine dependence: Secondary | ICD-10-CM | POA: Insufficient documentation

## 2023-04-02 DIAGNOSIS — E440 Moderate protein-calorie malnutrition: Secondary | ICD-10-CM

## 2023-04-02 DIAGNOSIS — R531 Weakness: Secondary | ICD-10-CM | POA: Insufficient documentation

## 2023-04-02 DIAGNOSIS — Z79899 Other long term (current) drug therapy: Secondary | ICD-10-CM | POA: Insufficient documentation

## 2023-04-02 DIAGNOSIS — D509 Iron deficiency anemia, unspecified: Secondary | ICD-10-CM | POA: Diagnosis not present

## 2023-04-02 LAB — CBC WITH DIFFERENTIAL/PLATELET
Abs Immature Granulocytes: 0.07 10*3/uL (ref 0.00–0.07)
Basophils Absolute: 0.1 10*3/uL (ref 0.0–0.1)
Basophils Relative: 2 %
Eosinophils Absolute: 0.6 10*3/uL — ABNORMAL HIGH (ref 0.0–0.5)
Eosinophils Relative: 8 %
HCT: 35.3 % — ABNORMAL LOW (ref 39.0–52.0)
Hemoglobin: 10.2 g/dL — ABNORMAL LOW (ref 13.0–17.0)
Immature Granulocytes: 1 %
Lymphocytes Relative: 11 %
Lymphs Abs: 0.7 10*3/uL (ref 0.7–4.0)
MCH: 22.9 pg — ABNORMAL LOW (ref 26.0–34.0)
MCHC: 28.9 g/dL — ABNORMAL LOW (ref 30.0–36.0)
MCV: 79.1 fL — ABNORMAL LOW (ref 80.0–100.0)
Monocytes Absolute: 0.7 10*3/uL (ref 0.1–1.0)
Monocytes Relative: 9 %
Neutro Abs: 4.8 10*3/uL (ref 1.7–7.7)
Neutrophils Relative %: 69 %
Platelets: 563 10*3/uL — ABNORMAL HIGH (ref 150–400)
RBC: 4.46 MIL/uL (ref 4.22–5.81)
RDW: 18.4 % — ABNORMAL HIGH (ref 11.5–15.5)
WBC: 6.9 10*3/uL (ref 4.0–10.5)
nRBC: 0 % (ref 0.0–0.2)

## 2023-04-02 LAB — SAMPLE TO BLOOD BANK

## 2023-04-02 NOTE — Assessment & Plan Note (Signed)
He is moderation loss a lot of weight He has poor oral intake, inconsistent oral intake and eating by feelings After long discussion, he is in agreement to focus on high-protein diet and frequent small meals

## 2023-04-02 NOTE — Assessment & Plan Note (Signed)
He is not doing well since discharge from the hospital He has lost a lot of weight due to poor appetite His wife canceled his radiation oncology appointment yesterday not knowing that the patient could benefit from adjuvant radiation treatment Due to negative resection margin, there is no role for adjuvant systemic chemotherapy I will reach out to radiation oncology department to reschedule his radiation appointment In the meantime, we discussed importance of aggressive supportive care Will see him back in a few weeks once he start radiation treatment

## 2023-04-02 NOTE — Progress Notes (Signed)
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 is not doing well since discharge from the hospital He has lost a lot of weight due to poor appetite His wife canceled his radiation oncology appointment yesterday not knowing that the patient could benefit from adjuvant radiation treatment Due to negative resection margin, there is no role for adjuvant systemic chemotherapy I will reach out to radiation oncology department to reschedule his radiation appointment In the meantime, we discussed importance of aggressive supportive care Will see him back in a few weeks once he start radiation treatment  Generalized weakness He has generalized weakness and recent fall I think this stems from poor nutrition I recommend physical therapy and rehab and he agrees  Iron deficiency anemia He has signs of iron deficiency anemia due to recent blood loss I recommend the patient to continue taking oral iron supplement His blood count is improving  Protein-calorie malnutrition, moderate (HCC) He is moderation loss a lot of weight He has poor oral intake, inconsistent oral intake and eating by feelings After long discussion, he is in agreement to focus on high-protein diet and frequent small meals  Orders Placed This Encounter  Procedures   Ambulatory referral to Physical Therapy    Referral Priority:   Routine    Referral Type:   Physical Medicine    Referral Reason:   Specialty Services Required    Requested Specialty:   Physical Therapy    Number of Visits Requested:   1    All questions were answered. The patient knows to call the clinic with any problems, questions or concerns. The total time spent in the appointment was 40 minutes encounter with patients including review of chart and various tests results, discussions about plan of care and coordination of care plan   Artis Delay,  MD 04/02/2023 1:14 PM  INTERVAL HISTORY: Please see below for problem oriented charting. he returns for surveillance follow-up after recent discharge from the hospital He is here accompanied by his wife He has lost a lot of weight due to poor oral intake He usually wake up around noon and would eat 1 meal in the evening due to poor appetite He is very sedentary and does not move much He feels weak He continues to have mild intermittent cough since discharge from the hospital His wife does not know that the hospitalist has prescribed multiple medications that she has yet to pick up from the pharmacy I went through his medication list We discussed importance of improved oral dietary intake and taking his medications as prescribed His appointment to radiation oncologist was canceled yesterday due to misunderstanding but the patient is interested to undergo adjuvant radiation treatment  REVIEW OF SYSTEMS:   Constitutional: Denies fevers, chills or abnormal weight loss Eyes: Denies blurriness of vision Ears, nose, mouth, throat, and face: Denies mucositis or sore throat Respiratory: Denies cough, dyspnea or wheezes Cardiovascular: Denies palpitation, chest discomfort or lower extremity swelling Gastrointestinal:  Denies nausea, heartburn or change in bowel habits Skin: Denies abnormal skin rashes Lymphatics: Denies new lymphadenopathy or easy bruising Behavioral/Psych: Mood is stable, no new changes  All other systems were reviewed with the patient and are negative.  I have reviewed the past medical history, past surgical history, social history and family history with the patient and they are unchanged from previous note.  ALLERGIES:  has No Known Allergies.  MEDICATIONS:  Current Outpatient Medications  Medication Sig Dispense Refill   albuterol (VENTOLIN HFA) 108 (90 Base) MCG/ACT inhaler Inhale 2 puffs into the lungs every 6 (six) hours as needed for wheezing or shortness of breath.  8 g 2   aspirin EC 81 MG tablet Take 1 tablet (81 mg total) by mouth daily. Swallow whole. 150 tablet 0   Cholecalciferol (VITAMIN D3) 50 MCG (2000 UT) TABS Take 2,000 Units by mouth daily.     clopidogrel (PLAVIX) 75 MG tablet Take 1 tablet (75 mg total) by mouth daily.     cyanocobalamin (VITAMIN B12) 1000 MCG tablet Take 1 tablet (1,000 mcg total) by mouth daily. 60 tablet 0   docusate sodium (COLACE) 100 MG capsule Take 1 capsule (100 mg total) by mouth 2 (two) times daily. 10 capsule 0   ferrous sulfate 325 (65 FE) MG EC tablet Take 1 tablet (325 mg total) by mouth 2 (two) times daily. 60 tablet 3   gabapentin (NEURONTIN) 300 MG capsule Take 1 capsule (300 mg total) by mouth at bedtime. 30 capsule 0   guaiFENesin (MUCINEX) 600 MG 12 hr tablet Take 1 tablet (600 mg total) by mouth 2 (two) times daily. 60 tablet 2   levothyroxine (SYNTHROID) 88 MCG tablet Take 88 mcg by mouth daily.     losartan (COZAAR) 25 MG tablet Take 1 tablet by mouth in the evening 30 tablet 9   methocarbamol (ROBAXIN) 500 MG tablet Take 500 mg by mouth every 6 (six) hours as needed for muscle spasms.     Multiple Vitamin (MULTIVITAMIN WITH MINERALS) TABS tablet Take 1 tablet by mouth daily. Centrum 50+     pantoprazole (PROTONIX) 40 MG tablet Take 1 tablet (40 mg total) by mouth 2 (two) times daily before a meal. 60 tablet 0   polyethylene glycol (MIRALAX / GLYCOLAX) 17 g packet Take 17 g by mouth daily. 14 each 0   rosuvastatin (CRESTOR) 20 MG tablet Take 1 tablet by mouth once daily 90 tablet 0   No current facility-administered medications for this visit.    SUMMARY OF ONCOLOGIC HISTORY: Oncology History Overview Note  Cancer of base of tongue, HPV negative   Primary site: Pharynx - Oropharynx   Staging method: AJCC 7th Edition   Clinical: Stage IVB (T2, N3, M0) signed by Artis Delay, MD on 10/15/2013 10:25 AM   Summary: Stage IVB (T2, N3, M0)     Cancer of base of tongue (HCC)  09/17/2013 Imaging   CT scan  of the neck showed large bilateral neck lymphadenopathy with associated tongue mass.   09/23/2013 Procedure   Fine needle aspirate of the left neck mass came back positive for squamous cell carcinoma.   10/04/2013 Surgery   Laryngoscopy showed a firm mass within the left tongue base extending past the midline and encompassing much of the tongue base.  There is extension of the mass into the vallecula and on to the lingual surface of the epiglottis.   10/04/2013 Pathology Results   Vallecula and tongue biopsy confirmed squamous cell carcinoma.   10/15/2013 Imaging   PET/CT scan showed no evidence of distant metastatic disease apart from just regional lymph node involvement.   10/25/2013 - 11/01/2013 Hospital Admission   He was admitted to the hospital due to malignant hypercalcemia and treatment was initiated.   10/27/2013 - 12/13/2013 Chemotherapy   He was given 3 cycles of induction chemo with cisplatin, taxotere, 5 FU   11/03/2013 - 11/07/2013 Hospital Admission   He  was hospitalized for dehydration   12/31/2013 Procedure   PICC line was removed   01/10/2014 Imaging   PET Ct scan showed near complete response to treatment   01/26/2014 - 03/18/2014 Radiation Therapy   Treated with helical IMRT tomotherapy:  Base of tongue and bilateral neck / 70 Gy in 35 fractions to gross disease, 63 Gy in 35 fractions to high risk nodal echelons, and 56 Gy in 35 fractions to intermediate risk nodal echelons.   07/14/2014 Imaging   CT scan show near complete response to treatment but new lung nodule   09/22/2014 Imaging   CT Neck - No new adenopathy or nodes with increasing size.  No evidence for residual or recurrent primary tumor.    09/22/2014 Imaging   CT Chest - Resolution of left upper lobe nodule, likely infectious or inflammatory.  No acute process or evidence of metastatic disease in chest.    12/23/2014 Imaging   CT Neck - Extensive radiation changes in the neck are stable.  6 mm calcified left level 3  node is stable. No new adenopathy or mass.    Thymic carcinoma (HCC)  07/28/2022 Imaging   1. New 4.8 cm x 4.0 cm low-attenuation anterior mediastinal mass with an ill-defined component of mildly increased attenuation. This could reflect a complex thymic cyst, although a cystic neoplasm cannot be excluded. MRI correlation is recommended. 2. No significant extracardiac findings within the visualized chest   01/23/2023 Imaging   1. Complex 5.4 x 3.6 x 5.7 cm cystic left anterior mediastinal mass with enhancing solid 2.4 x 2.0 cm mural nodule superiorly, minimally increased in size since 07/26/2022 CT and new from 08/18/2015 CT. Cystic thymic malignancy favored. Thoracic surgical consultation advised. 2. No findings of metastatic disease in the chest. 3. One vessel coronary atherosclerosis. 4. Moderate centrilobular emphysema with diffuse bronchial wall thickening and saber sheath trachea, suggesting COPD. 5.  Emphysema (ICD10-J43.9).   03/03/2023 Pathology Results   SURGICAL PATHOLOGY CASE: [id number] PATIENT: Angel [name] Surgical Pathology Report  Clinical History: anterior mediastinal mass (cm)  FINAL MICROSCOPIC DIAGNOSIS:  A. 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 Dr. Kenyon Ana reviewed the case and concurs with the above diagnosis.  ONCOLOGY TABLE:  THYMUS: Resection  Procedure: Thymectomy Tumor Site: Thymus Histologic Type: Thymic squamous cell carcinoma Tumor Size: 7.3 cm Lymphovascular Invasion: Not identified Treatment Effect: No known presurgical therapy Site(s) Involved by Direct Tumor Invasion: Not applicable Margins: All margins negative for  tumor Regional Lymph Nodes:      Number of Lymph Nodes with Tumor: 1      Number of Lymph Nodes Examined: 2 Distant Metastasis:      Distant Site(s) Involved: Not applicable Pathologic Stage Classification (pTNM, AJCC 8th Edition): pT1a, pN1 Ancillary Studies: Can be performed up request Representative Tumor Block: A1  (v4.1.0.1)     03/03/2023 Surgery   Operative Report    DATE OF PROCEDURE: 03/03/2023   PREOPERATIVE DIAGNOSIS:  Anterior mediastinal mass, suspect thymoma.   POSTOPERATIVE DIAGNOSIS:  Anterior mediastinal mass, suspect thymoma.   PROCEDURE:  Xi robotic-assisted left VATS,  Thymectomy.   SURGEON:  Salvatore Decent. Dorris Fetch, MD   ASSISTANT:  Doree Fudge, PA.   ANESTHESIA:  General.   FINDINGS:  Large mass directly adjacent, but not invading the phrenic nerve.  Adhesions to left upper lobe necessitating en bloc wedge resection.   03/24/2023 Imaging   1. Generalized mildly prominent fluid-filled small bowel loops without discrete small bowel caliber transition. Fluid levels throughout the right, transverse and descending colon. No bowel wall thickening. Findings are nonspecific but most suggestive of mild adynamic ileus such as due to enterocolitis. 2. Minimal sigmoid diverticulosis. 3. Chronic diffuse pancreatic duct dilation up to 0.5 cm diameter, mildly increased from 0.3 cm on 08/25/2015 CT. No discrete pancreatic mass. Suggest nonemergent outpatient MRI abdomen with MRCP without and with IV contrast for further evaluation. 4. Trace layering left pleural effusion. Mild elevation of the left hemidiaphragm with platelike mild-to-moderate left lung base atelectasis. 5.  Aortic Atherosclerosis (ICD10-I70.0).   03/25/2023 Initial Diagnosis   Thymic carcinoma (HCC)   03/25/2023 Cancer Staging   Staging form: Thymus, AJCC 8th Edition - Pathologic stage from 03/25/2023: Stage IVA (pT1a, pN1, cM0) - Signed by Artis Delay, MD on 03/25/2023 Stage prefix: Initial  diagnosis     PHYSICAL EXAMINATION: ECOG PERFORMANCE STATUS: 2 - Symptomatic, <50% confined to bed  Vitals:   04/02/23 0909  BP: (!) 155/85  Pulse: 82  Resp: 15  Temp: 97.6 F (36.4 C)  SpO2: 96%   Filed Weights   04/02/23 0909  Weight: 116 lb 12.8 oz (53 kg)    GENERAL:alert, no distress and comfortable SKIN: skin color, texture, turgor are normal, no rashes or significant lesions EYES: normal, Conjunctiva are pink and non-injected, sclera clear OROPHARYNX:no exudate, no erythema and lips, buccal mucosa, and tongue normal  NECK: supple, thyroid normal size, non-tender, without nodularity LYMPH:  no palpable lymphadenopathy in the cervical, axillary or inguinal LUNGS: clear to auscultation and percussion with normal breathing effort HEART: regular rate & rhythm and no murmurs and no lower extremity edema ABDOMEN:abdomen soft, non-tender and normal bowel sounds Musculoskeletal:no cyanosis of digits and no clubbing  NEURO: alert & oriented x 3 with fluent speech, no focal motor/sensory deficits  LABORATORY DATA:  I have reviewed the data as listed    Component Value Date/Time   NA 141 03/26/2023 0448   NA 143 10/28/2022 0846   NA 140 02/14/2017 0851   K 3.6 03/26/2023 0448   K 5.1 02/14/2017 0851   CL 106 03/26/2023 0448   CO2 26 03/26/2023 0448   CO2 27 02/14/2017 0851   GLUCOSE 80 03/26/2023 0448   GLUCOSE 117 02/14/2017 0851   BUN 12 03/26/2023 0448   BUN 4 (L) 10/28/2022 0846   BUN 7.9 02/14/2017 0851   CREATININE 0.88 03/26/2023 0448   CREATININE 1.09 02/12/2018 0943   CREATININE 0.9 02/14/2017 0851   CALCIUM 8.6 (L) 03/26/2023 0448   CALCIUM 9.7 02/14/2017 0851   PROT 7.3 03/24/2023 1608   PROT 7.4 02/14/2017 0851   ALBUMIN 3.9 03/24/2023 1608   ALBUMIN 4.3 02/14/2017 0851   AST 21 03/24/2023 1608   AST 20 02/12/2018 0943   AST 18 02/14/2017 0851   ALT 14 03/24/2023 1608   ALT 12 02/12/2018 0943   ALT 9 02/14/2017 0851   ALKPHOS 59 03/24/2023 1608    ALKPHOS 59 02/14/2017 0851   BILITOT 0.6 03/24/2023 1608   BILITOT 0.5 02/12/2018 0943   BILITOT 0.33 02/14/2017 0851   GFRNONAA >60 03/26/2023 0448   GFRNONAA >60 02/12/2018 0943   GFRAA >60 08/12/2019 2107   GFRAA >60 02/12/2018 0943    No  results found for: "SPEP", "UPEP"  Lab Results  Component Value Date   WBC 6.9 04/02/2023   NEUTROABS 4.8 04/02/2023   HGB 10.2 (L) 04/02/2023   HCT 35.3 (L) 04/02/2023   MCV 79.1 (L) 04/02/2023   PLT 563 (H) 04/02/2023      Chemistry      Component Value Date/Time   NA 141 03/26/2023 0448   NA 143 10/28/2022 0846   NA 140 02/14/2017 0851   K 3.6 03/26/2023 0448   K 5.1 02/14/2017 0851   CL 106 03/26/2023 0448   CO2 26 03/26/2023 0448   CO2 27 02/14/2017 0851   BUN 12 03/26/2023 0448   BUN 4 (L) 10/28/2022 0846   BUN 7.9 02/14/2017 0851   CREATININE 0.88 03/26/2023 0448   CREATININE 1.09 02/12/2018 0943   CREATININE 0.9 02/14/2017 0851      Component Value Date/Time   CALCIUM 8.6 (L) 03/26/2023 0448   CALCIUM 9.7 02/14/2017 0851   ALKPHOS 59 03/24/2023 1608   ALKPHOS 59 02/14/2017 0851   AST 21 03/24/2023 1608   AST 20 02/12/2018 0943   AST 18 02/14/2017 0851   ALT 14 03/24/2023 1608   ALT 12 02/12/2018 0943   ALT 9 02/14/2017 0851   BILITOT 0.6 03/24/2023 1608   BILITOT 0.5 02/12/2018 0943   BILITOT 0.33 02/14/2017 0851       RADIOGRAPHIC STUDIES: I have personally reviewed the radiological images as listed and agreed with the findings in the report. CUP PACEART REMOTE DEVICE CHECK  Result Date: 03/28/2023 ILR summary report received. Battery status OK. Normal device function. No new brady, or pause episodes. No new AF episodes. 2 Symptom events not previously routed to Triage on 03/01/23, EGMs c/w SR with rare PAC and PVC ectopy. Monthly summary reports and ROV/PRN. MC, CVRS  CT ABDOMEN PELVIS W CONTRAST  Result Date: 03/24/2023 CLINICAL DATA:  Generalized abdominal pain, most prominent in the left lower  quadrant. Nausea. Constipation. EXAM: CT ABDOMEN AND PELVIS WITH CONTRAST TECHNIQUE: Multidetector CT imaging of the abdomen and pelvis was performed using the standard protocol following bolus administration of intravenous contrast. RADIATION DOSE REDUCTION: This exam was performed according to the departmental dose-optimization program which includes automated exposure control, adjustment of the mA and/or kV according to patient size and/or use of iterative reconstruction technique. CONTRAST:  OMNIPAQUE IOHEXOL 300 MG/ML  SOLN COMPARISON:  08/25/2015 CT abdomen/pelvis. FINDINGS: Lower chest: Trace layering left pleural effusion. Mild elevation of the left hemidiaphragm with platelike mild-to-moderate left lung base atelectasis. Hepatobiliary: Normal liver size. No liver mass. Normal gallbladder with no radiopaque cholelithiasis. No biliary ductal dilatation. Pancreas: Chronic diffuse pancreatic duct dilation up to 0.5 cm diameter, increased from 0.3 cm on 08/25/2015 CT. No discrete pancreatic mass. No pancreatic parenchymal calcifications or peripancreatic inflammatory changes. Spleen: Normal size. No mass. Adrenals/Urinary Tract: Normal adrenals. Normal kidneys with no hydronephrosis and no renal mass. Normal bladder. Stomach/Bowel: Normal non-distended stomach. Generalized mildly prominent fluid-filled small bowel loops measuring up to 3.3 cm diameter in the left abdomen. No discrete small bowel caliber transition. No small bowel wall thickening. Normal appendix. Fluid levels throughout right, transverse and descending colon. Minimal sigmoid diverticulosis with no large bowel wall thickening or significant pericolonic fat stranding. Vascular/Lymphatic: Atherosclerotic nonaneurysmal abdominal aorta. Patent portal, splenic, hepatic and renal veins. No pathologically enlarged lymph nodes in the abdomen or pelvis. Reproductive: Normal size prostate. Other: No pneumoperitoneum, ascites or focal fluid  collection. Surgical clips noted in the left inguinal  and ventral left lower quadrant region. No evidence of ventral or inguinal hernia. Musculoskeletal: No aggressive appearing focal osseous lesions. Marked thoracolumbar spondylosis. IMPRESSION: 1. Generalized mildly prominent fluid-filled small bowel loops without discrete small bowel caliber transition. Fluid levels throughout the right, transverse and descending colon. No bowel wall thickening. Findings are nonspecific but most suggestive of mild adynamic ileus such as due to enterocolitis. 2. Minimal sigmoid diverticulosis. 3. Chronic diffuse pancreatic duct dilation up to 0.5 cm diameter, mildly increased from 0.3 cm on 08/25/2015 CT. No discrete pancreatic mass. Suggest nonemergent outpatient MRI abdomen with MRCP without and with IV contrast for further evaluation. 4. Trace layering left pleural effusion. Mild elevation of the left hemidiaphragm with platelike mild-to-moderate left lung base atelectasis. 5.  Aortic Atherosclerosis (ICD10-I70.0). Electronically Signed   By: Delbert Phenix M.D.   On: 03/24/2023 18:16   DG Chest 2 View  Result Date: 03/20/2023 CLINICAL DATA:  Status post surgery for mediastinal mass 1 week ago. EXAM: CHEST - 2 VIEW COMPARISON:  March 07, 2023. FINDINGS: Stable cardiomediastinal silhouette. No pneumothorax is noted currently. Right lung is clear. Left basilar subsegmental atelectasis or scarring is noted. Elevated left hemidiaphragm is noted mild S shaped scoliosis of thoracic spine is noted. Subcutaneous emphysema seen overlying left lateral chest wall on prior exam is significantly decreased. IMPRESSION: Left basilar subsegmental atelectasis or scarring is noted. Electronically Signed   By: Lupita Raider M.D.   On: 03/20/2023 16:25   DG Chest 2 View  Result Date: 03/07/2023 CLINICAL DATA:  Recent left chest tube removal. Follow up pneumothorax. EXAM: CHEST - 2 VIEW COMPARISON:  Radiographs 03/06/2023 and 03/05/2023.   CT 01/23/2023. FINDINGS: Unchanged volume loss in the left hemithorax with medial left lower lobe airspace disease and a probable small left pleural effusion, unchanged. No significant changes small left apical pneumothorax. The right lung has a stable appearance. Persistent evidence of a small right pleural effusion. The heart size and mediastinal contours are stable. Loop recorder noted in the left anterior chest wall. Moderate soft tissue emphysema throughout the left lateral chest wall appears unchanged. A large left-sided cervical rib is noted. IMPRESSION: 1. No significant change in small left apical pneumothorax and left chest wall soft tissue emphysema. 2. Unchanged volume loss in the left hemithorax with medial left lower lobe airspace disease and probable small left pleural effusion. 3. Unchanged small right pleural effusion. Electronically Signed   By: Carey Bullocks M.D.   On: 03/07/2023 11:27   DG Chest 2 View  Result Date: 03/06/2023 CLINICAL DATA:  [id number] Pneumothorax [id number]. EXAM: CHEST - 2 VIEW COMPARISON:  03/05/2023. FINDINGS: Redemonstration of left retrocardiac airspace opacity obscuring the left hemidiaphragm, descending thoracic aorta and blunting the left lateral costophrenic angle suggesting combination of left lower lobe atelectasis and/or consolidation with pleural effusion. No significant interval change. Redemonstration of small left apical pneumothorax without significant interval change. Redemonstration of heterogeneous opacity overlying the left upper mid lung zones, likely secondary to overlying surgical emphysema which is grossly similar to the prior study. Bilateral lung fields are otherwise clear. There is new blunting of the right lateral costophrenic angle, which may represent trace right pleural effusion. No right pneumothorax. Stable cardio-mediastinal silhouette. Redemonstration of loop recorder device overlying the left anterior chest wall. No acute osseous  abnormalities. The soft tissues are within normal limits. IMPRESSION: *Persistent small left apical pneumothorax and surgical emphysema along the left chest wall. *Persistent left retrocardiac opacity, as described above. No significant interval change. *New  trace right pleural effusion. Electronically Signed   By: Jules Schick M.D.   On: 03/06/2023 09:21   DG Chest 1V REPEAT Same Day  Result Date: 03/05/2023 CLINICAL DATA:  Chest tube removal. EXAM: CHEST - 1 VIEW SAME DAY COMPARISON:  Same day. FINDINGS: Left-sided chest tube has been removed. Stable small left apical pneumothorax is noted. Left rib fractures again noted. Increased subcutaneous emphysema is seen over left lateral chest wall. IMPRESSION: Status post removal of left-sided chest tube. Stable small left apical pneumothorax. Increased subcutaneous emphysema is seen over left lateral chest wall. Electronically Signed   By: Lupita Raider M.D.   On: 03/05/2023 17:55   DG CHEST PORT 1 VIEW  Result Date: 03/05/2023 CLINICAL DATA:  Pleural effusion, chest tube EXAM: PORTABLE CHEST 1 VIEW COMPARISON:  03/04/2023 FINDINGS: Unchanged AP portable chest radiograph. Left-sided chest tube remains in position with minimal, less than 5% left apical pneumothorax and unchanged elevation of the left hemidiaphragm. Right lung is normally aerated. Cardiomegaly. No acute osseous findings. IMPRESSION: 1. Left-sided chest tube remains in position with minimal, less than 5% left apical pneumothorax and unchanged elevation of the left hemidiaphragm. 2. Cardiomegaly. Electronically Signed   By: Jearld Lesch M.D.   On: 03/05/2023 10:15   DG Chest Port 1 View  Result Date: 03/04/2023 CLINICAL DATA:  Pneumothorax.  Left-sided chest tube. EXAM: PORTABLE CHEST 1 VIEW COMPARISON:  March 03, 2023. FINDINGS: Stable cardiomediastinal silhouette. Left-sided chest tube is noted without pneumothorax. Right lung is clear. Right internal jugular catheter is unchanged.  Elevated left hemidiaphragm is noted with minimal left basilar subsegmental atelectasis. Stable left rib fracture. Mild subcutaneous emphysema is seen over left lateral chest wall. IMPRESSION: Left-sided chest tube is noted without pneumothorax. Electronically Signed   By: Lupita Raider M.D.   On: 03/04/2023 10:26

## 2023-04-02 NOTE — Assessment & Plan Note (Signed)
He has generalized weakness and recent fall I think this stems from poor nutrition I recommend physical therapy and rehab and he agrees

## 2023-04-02 NOTE — Assessment & Plan Note (Signed)
He has signs of iron deficiency anemia due to recent blood loss I recommend the patient to continue taking oral iron supplement His blood count is improving

## 2023-04-03 ENCOUNTER — Telehealth: Payer: Self-pay | Admitting: Radiation Oncology

## 2023-04-03 NOTE — Telephone Encounter (Signed)
Unable to leave message for patient to call back to schedule consult per 11/21 referral due to voicemail full.

## 2023-04-03 NOTE — Telephone Encounter (Signed)
Attempted to contact patient. No answer,LMTCB 

## 2023-04-04 NOTE — Telephone Encounter (Signed)
VM full unable to LM

## 2023-04-08 ENCOUNTER — Encounter: Payer: Self-pay | Admitting: Cardiology

## 2023-04-08 NOTE — Progress Notes (Signed)
Head and Neck Cancer Location of Tumor / Histology:  Thymic carcinoma     Biopsies revealed: 03/03/23 cal History: anterior mediastinal mass (cm)     FINAL MICROSCOPIC DIAGNOSIS:  A. 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 Dr. Kenyon Ana reviewed the case and concurs with the above diagnosis.    ONCOLOGY TABLE:  THYMUS: Resection  Procedure: Thymectomy Tumor Site: Thymus Histologic Type: Thymic squamous cell carcinoma Tumor Size: 7.3 cm Lymphovascular Invasion: Not identified Treatment Effect: No known presurgical therapy Site(s) Involved by Direct Tumor Invasion: Not applicable Margins: All margins negative for tumor Regional Lymph Nodes:      Number of Lymph Nodes with Tumor: 1      Number of Lymph Nodes Examined: 2 Distant Metastasis:      Distant Site(s) Involved: Not applicable Pathologic Stage Classification (pTNM, AJCC 8th Edition): pT1a, pN1 Ancillary Studies: Can be performed up request Representative Tumor Block: A1  (v4.1.0.1)    GROSS DESCRIPTION:  A. Received fresh labeled with the patient's name and "Thymus" is an 11.2 x 9.4 x 2.7 cm piece of yellow-tan, lobulated fibrofatty tissue within which is a 7.3 x 4.2 x 2.3 cm cystic mass exuding yellow grumous material with tan, fungating solid components (outer surface inked black). A portion is placed in RPMI and sent to [facility] for possible flow cytometry. A portion of the mass is adherent to a 6.9 x 4.0 x 2.8 cm wedge of lung. Representative sections are submitted in five cassettes (lung in cassette 5).   Nutrition Status Yes No Comments  Weight changes? []  [x]     Swallowing concerns? [x]  []    PEG? []  [x]     Referrals Yes No Comments  Social Work? []  [x]    Dentistry? []  []    Swallowing therapy? []  [x]    Nutrition? []  [x]    Med/Onc? [x]  []     Safety Issues Yes No Comments  Prior radiation? [x]  []   Yes 2015- Head and neck  Pacemaker/ICD? [x]  []  Loop recorder  Possible current pregnancy? []  [x]    Is the patient on methotrexate? []  [x]     Tobacco/Marijuana/Snuff/ETOH use: yes marijuana occasionally   Past/Anticipated interventions by otolaryngology, if any: no  Past/Anticipated interventions by medical oncology, if any:   Due to negative resection margin, there is no role for adjuvant systemic chemotherapy I will reach out to radiation oncology department to reschedule his radiation appointment In the meantime, we discussed importance of aggressive supportive care Will see him back in a few weeks once he start radiation treatment   Current Complaints / other details:     BP 109/69   Pulse 68   Temp 98.1 F (36.7 C)   Resp 17   Ht 5\' 5"  (1.651 m)   Wt 119 lb 12.8 oz (54.3 kg)   SpO2 96%   BMI 19.94 kg/m
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
## 2023-04-08 NOTE — Progress Notes (Addendum)
Writer sent form to the device clinic on 04/08/23 for loop recorder.

## 2023-04-08 NOTE — Progress Notes (Signed)
TO BE COMPLETED BY RADIATION ONCOLOGIST'S OFFICE   Scheduled start date of radiation therapy: consult on 04/09/23   Scheduled end date of radiation therapy:   Site to be treated: Thymic carcinoma   Radiation Oncologist: Lonie Peak    TO BE COMPLETED BY RECIPIENT'S OFFICE   By signing you are indicating that you've received this notice and no further action is needed:   **PLEASE LIST ANY NOTES OR SPECIAL REQUESTS:       CARDIOLOGIST SIGNATURE:  Dr. Nobie Putnam Per Device Clinic Standing Orders, Lenor Coffin  04/08/2023 11:21 AM  **Please route completed form back to Radiation Oncology Nursing and "P [facility] RAD ONC ADMIN", OR send an update if there will be a delay in having form completed by expected start date.  **Call [phone] if you have any questions or do not get an in-basket response from a Radiation Oncology staff member

## 2023-04-08 NOTE — Telephone Encounter (Signed)
Attempted outreach again.  VM is full unable to LM. Appears mychart is active, will send message requesting a callback.

## 2023-04-09 ENCOUNTER — Telehealth: Payer: Self-pay | Admitting: *Deleted

## 2023-04-09 ENCOUNTER — Ambulatory Visit
Admission: RE | Admit: 2023-04-09 | Discharge: 2023-04-09 | Disposition: A | Discharge status: Home / Self Care | Payer: Medicare HMO | Source: Ambulatory Visit | Attending: Radiation Oncology | Admitting: Radiation Oncology

## 2023-04-09 ENCOUNTER — Encounter: Payer: Self-pay | Admitting: Radiation Oncology

## 2023-04-09 ENCOUNTER — Other Ambulatory Visit: Payer: Self-pay

## 2023-04-09 VITALS — BP 109/69 | HR 68 | Temp 98.1°F | Resp 17 | Ht 65.0 in | Wt 119.8 lb

## 2023-04-09 DIAGNOSIS — Z85810 Personal history of malignant neoplasm of tongue: Secondary | ICD-10-CM | POA: Diagnosis not present

## 2023-04-09 DIAGNOSIS — Z7902 Long term (current) use of antithrombotics/antiplatelets: Secondary | ICD-10-CM | POA: Diagnosis not present

## 2023-04-09 DIAGNOSIS — I700 Atherosclerosis of aorta: Secondary | ICD-10-CM | POA: Diagnosis not present

## 2023-04-09 DIAGNOSIS — C37 Malignant neoplasm of thymus: Secondary | ICD-10-CM | POA: Insufficient documentation

## 2023-04-09 DIAGNOSIS — C01 Malignant neoplasm of base of tongue: Secondary | ICD-10-CM | POA: Insufficient documentation

## 2023-04-09 DIAGNOSIS — K5900 Constipation, unspecified: Secondary | ICD-10-CM | POA: Diagnosis not present

## 2023-04-09 DIAGNOSIS — Z87891 Personal history of nicotine dependence: Secondary | ICD-10-CM | POA: Insufficient documentation

## 2023-04-09 DIAGNOSIS — E039 Hypothyroidism, unspecified: Secondary | ICD-10-CM | POA: Insufficient documentation

## 2023-04-09 DIAGNOSIS — Z9221 Personal history of antineoplastic chemotherapy: Secondary | ICD-10-CM | POA: Insufficient documentation

## 2023-04-09 DIAGNOSIS — Z79899 Other long term (current) drug therapy: Secondary | ICD-10-CM | POA: Insufficient documentation

## 2023-04-09 DIAGNOSIS — Z510 Encounter for antineoplastic radiation therapy: Secondary | ICD-10-CM | POA: Insufficient documentation

## 2023-04-09 DIAGNOSIS — Z79890 Hormone replacement therapy (postmenopausal): Secondary | ICD-10-CM | POA: Insufficient documentation

## 2023-04-09 DIAGNOSIS — Z809 Family history of malignant neoplasm, unspecified: Secondary | ICD-10-CM | POA: Insufficient documentation

## 2023-04-09 DIAGNOSIS — Z7982 Long term (current) use of aspirin: Secondary | ICD-10-CM | POA: Diagnosis not present

## 2023-04-09 DIAGNOSIS — J90 Pleural effusion, not elsewhere classified: Secondary | ICD-10-CM | POA: Insufficient documentation

## 2023-04-09 DIAGNOSIS — I10 Essential (primary) hypertension: Secondary | ICD-10-CM | POA: Diagnosis not present

## 2023-04-09 DIAGNOSIS — Z923 Personal history of irradiation: Secondary | ICD-10-CM | POA: Insufficient documentation

## 2023-04-09 LAB — CBC WITH DIFFERENTIAL (CANCER CENTER ONLY)
Abs Immature Granulocytes: 0.11 10*3/uL — ABNORMAL HIGH (ref 0.00–0.07)
Basophils Absolute: 0.2 10*3/uL — ABNORMAL HIGH (ref 0.0–0.1)
Basophils Relative: 2 %
Eosinophils Absolute: 0.4 10*3/uL (ref 0.0–0.5)
Eosinophils Relative: 5 %
HCT: 31.6 % — ABNORMAL LOW (ref 39.0–52.0)
Hemoglobin: 9.3 g/dL — ABNORMAL LOW (ref 13.0–17.0)
Immature Granulocytes: 1 %
Lymphocytes Relative: 12 %
Lymphs Abs: 1 10*3/uL (ref 0.7–4.0)
MCH: 23.3 pg — ABNORMAL LOW (ref 26.0–34.0)
MCHC: 29.4 g/dL — ABNORMAL LOW (ref 30.0–36.0)
MCV: 79.2 fL — ABNORMAL LOW (ref 80.0–100.0)
Monocytes Absolute: 0.8 10*3/uL (ref 0.1–1.0)
Monocytes Relative: 9 %
Neutro Abs: 6 10*3/uL (ref 1.7–7.7)
Neutrophils Relative %: 71 %
Platelet Count: 629 10*3/uL — ABNORMAL HIGH (ref 150–400)
RBC: 3.99 MIL/uL — ABNORMAL LOW (ref 4.22–5.81)
RDW: 18.6 % — ABNORMAL HIGH (ref 11.5–15.5)
WBC Count: 8.6 10*3/uL (ref 4.0–10.5)
nRBC: 0 % (ref 0.0–0.2)

## 2023-04-09 NOTE — Telephone Encounter (Signed)
Called patient to inform of appt. with NP- Linus Galas on 04-16-23- arrival time- 10:45 am, spoke with patient's wife - Tamela Oddi and she is aware of this appt.

## 2023-04-09 NOTE — Telephone Encounter (Signed)
Multiple attempts made to reach Pt.  Will send letter.  Await further needs.

## 2023-04-15 ENCOUNTER — Ambulatory Visit
Admission: RE | Admit: 2023-04-15 | Discharge: 2023-04-15 | Disposition: A | Discharge status: Home / Self Care | Payer: Medicare HMO | Source: Ambulatory Visit | Attending: Radiation Oncology | Admitting: Radiation Oncology

## 2023-04-15 ENCOUNTER — Telehealth: Payer: Self-pay | Admitting: Hematology and Oncology

## 2023-04-15 DIAGNOSIS — C37 Malignant neoplasm of thymus: Secondary | ICD-10-CM | POA: Diagnosis not present

## 2023-04-15 DIAGNOSIS — Z510 Encounter for antineoplastic radiation therapy: Secondary | ICD-10-CM | POA: Diagnosis not present

## 2023-04-15 NOTE — Telephone Encounter (Signed)
 Patient's spouse is aware of scheduled appointment times/dates

## 2023-04-16 ENCOUNTER — Other Ambulatory Visit: Payer: Self-pay | Admitting: Thoracic Surgery (Cardiothoracic Vascular Surgery)

## 2023-04-16 DIAGNOSIS — M542 Cervicalgia: Secondary | ICD-10-CM | POA: Diagnosis not present

## 2023-04-16 DIAGNOSIS — R630 Anorexia: Secondary | ICD-10-CM | POA: Diagnosis not present

## 2023-04-16 DIAGNOSIS — Z85238 Personal history of other malignant neoplasm of thymus: Secondary | ICD-10-CM | POA: Diagnosis not present

## 2023-04-16 DIAGNOSIS — R634 Abnormal weight loss: Secondary | ICD-10-CM | POA: Diagnosis not present

## 2023-04-16 DIAGNOSIS — E039 Hypothyroidism, unspecified: Secondary | ICD-10-CM | POA: Diagnosis not present

## 2023-04-16 DIAGNOSIS — J9859 Other diseases of mediastinum, not elsewhere classified: Secondary | ICD-10-CM

## 2023-04-18 ENCOUNTER — Encounter: Payer: Self-pay | Admitting: Radiation Oncology

## 2023-04-18 NOTE — Progress Notes (Addendum)
I conferred with Dr. Jacinto Halim as pt's loop recorder is going to get a significant dose of radiation as it is adjacent to his target (surgical bed). Per Dr Jacinto Halim, loop evalulation is not that important now and he is fine with [place] proceeding we deem fit. He has been stable from cardiac standpoint.  I notified Dr. Jimmey Ralph and Dr. Jacinto Halim of our radiation plans and we will proceed as best fit to cure his thymic carcinoma. -----------------------------------  Lonie Peak, MD

## 2023-04-18 NOTE — Progress Notes (Signed)
Per Dr. Basilio Cairo: "Dr. Jimmey Ralph and Dr. Jacinto Halim, I did hear back from Dr. Jacinto Halim and appreciate his permission to give any dose necessary to his loop recorder as it's next to his tumor bed that will require radiation.  He will start RT in about a week and receive a 6 wk course. Thanks, Maralyn Sago "

## 2023-04-18 NOTE — Progress Notes (Signed)
[facility] Summary Report / Loop Recorder 

## 2023-04-21 ENCOUNTER — Ambulatory Visit (INDEPENDENT_AMBULATORY_CARE_PROVIDER_SITE_OTHER): Payer: Self-pay | Admitting: Thoracic Surgery (Cardiothoracic Vascular Surgery)

## 2023-04-21 ENCOUNTER — Telehealth: Payer: Self-pay

## 2023-04-21 ENCOUNTER — Ambulatory Visit
Admission: RE | Admit: 2023-04-21 | Discharge: 2023-04-21 | Disposition: A | Discharge status: Home / Self Care | Payer: Medicare HMO | Source: Ambulatory Visit | Attending: Thoracic Surgery (Cardiothoracic Vascular Surgery) | Admitting: Thoracic Surgery (Cardiothoracic Vascular Surgery)

## 2023-04-21 VITALS — BP 155/80 | HR 69 | Resp 18 | Ht 65.0 in | Wt 118.0 lb

## 2023-04-21 DIAGNOSIS — Z510 Encounter for antineoplastic radiation therapy: Secondary | ICD-10-CM | POA: Diagnosis not present

## 2023-04-21 DIAGNOSIS — R0602 Shortness of breath: Secondary | ICD-10-CM | POA: Diagnosis not present

## 2023-04-21 DIAGNOSIS — R059 Cough, unspecified: Secondary | ICD-10-CM | POA: Diagnosis not present

## 2023-04-21 DIAGNOSIS — J9859 Other diseases of mediastinum, not elsewhere classified: Secondary | ICD-10-CM

## 2023-04-21 DIAGNOSIS — C37 Malignant neoplasm of thymus: Secondary | ICD-10-CM | POA: Diagnosis not present

## 2023-04-21 DIAGNOSIS — Z09 Encounter for follow-up examination after completed treatment for conditions other than malignant neoplasm: Secondary | ICD-10-CM

## 2023-04-21 NOTE — Telephone Encounter (Signed)
Outreach made to Pt, spoke with wife.  Loop recorder placed for syncope.  Per wife, Pt has had no recent episodes of syncope.  Advised would route to Dr. Jimmey Ralph for review and call back with any further advisement.

## 2023-04-21 NOTE — Telephone Encounter (Signed)
Alert received from [employer]:  ILR alert for AF and tachy AF event 12sec HR 163, 3 tachy EGM's, 19sec - 12sec, mean HR's 163-178 AF with RVR vs ST with ectopy No hx of PAF, ASA only - route to triage

## 2023-04-21 NOTE — Progress Notes (Signed)
[address]       Angel [name] [address]             [phone]     HPI: Angel [name] returns for scheduled follow-up visit after recent thymectomy  Angel [name] is a 72 year old man with a history of squamous cell carcinoma of the base of the tongue, tobacco abuse, marijuana use, lymphedema, PAD, syncope, and a stage IV thymoma.  He had a CT for coronary calcium scoring in March which showed a complex cystic anterior mediastinal mass.  He then had a follow-up CT about 6 months later which showed a 5.4 x 3.6 x 5.7 cm complex mass with a 2.4 x 2 cm solid component.  I did a robotic assisted thymectomy on 03/03/2023.  Postoperative course was complicated by atrial fibrillation and then later severe constipation.  I saw him in the office on 03/20/2023 and he was having a lot of issues with constipation.  He subsequently was admitted to the hospital about 4 days later with constipation.  Also had severe anemia and was found to have a duodenal ulcer.  Required transfusion.  Denies any pain.  Still just feels weak and tired.  Says his constipation is better.  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 8 g 2   aspirin EC 81 MG tablet Take 1 tablet (81 mg total) by mouth daily. Swallow whole. 150 tablet 0   Cholecalciferol (VITAMIN D3) 50 MCG (2000 UT) TABS Take 2,000 Units by mouth daily.     clopidogrel (PLAVIX) 75 MG tablet Take 1 tablet (75 mg total) by mouth daily.     cyanocobalamin (VITAMIN B12) 1000 MCG tablet Take 1 tablet (1,000 mcg total) by mouth daily. 60 tablet 0   docusate sodium (COLACE) 100 MG capsule Take 1 capsule (100 mg total) by mouth 2 (two) times daily. 10 capsule 0   ferrous sulfate 325 (65 FE) MG EC tablet Take 1 tablet (325 mg total) by mouth 2 (two) times daily. 60 tablet 3   gabapentin (NEURONTIN) 300 MG capsule Take 1 capsule (300 mg total) by mouth at bedtime. 30 capsule 0   guaiFENesin (MUCINEX) 600 MG 12 hr tablet Take 1 tablet (600 mg total) by mouth 2 (two) times daily. 60 tablet 2   levothyroxine (SYNTHROID) 88 MCG tablet Take 88 mcg by mouth daily.     losartan (COZAAR) 25 MG tablet Take 1 tablet by mouth in the evening 30 tablet 9   methocarbamol (ROBAXIN) 500 MG tablet Take 500 mg by mouth every  6 (six) hours as needed for muscle spasms.     Multiple Vitamin (MULTIVITAMIN WITH MINERALS) TABS tablet Take 1 tablet by mouth daily. Centrum 50+     pantoprazole (PROTONIX) 40 MG tablet Take 1 tablet (40 mg total) by mouth 2 (two) times daily before a meal. 60 tablet 0   polyethylene glycol (MIRALAX / GLYCOLAX) 17 g packet Take 17 g by mouth daily. 14 each 0   rosuvastatin (CRESTOR) 20 MG tablet Take 1 tablet by mouth once daily 90 tablet 0   No current facility-administered medications for this visit.    Physical Exam BP (!) 155/80   Pulse 69   Resp 18   Ht 5\' 5"  (1.651 m)   Wt 118 lb (53.5 kg)   SpO2 95% Comment: RA  BMI 19.47 kg/m  72 year old man in no acute distress Flat affect Lungs slightly diminished at left base otherwise clear Cardiac regular rate and rhythm with a 2/6  murmur No peripheral edema  Diagnostic Tests: CHEST - 2 VIEW   COMPARISON:  03/20/2023   FINDINGS: Heart size is normal. Cardiac loop recorder again seen in left anterior chest wall. Stable elevation of left hemidiaphragm and mild left lower lobe scarring. No evidence of acute infiltrate or edema. No pleural effusion. Mild thoracic spine degenerative changes and scoliosis again noted.   IMPRESSION: No active cardiopulmonary disease.   Stable elevation of left hemidiaphragm and left lower lobe scarring.     Electronically Signed   By: Danae Orleans M.D.   On: 04/21/2023 15:02    Impression: Angel [name] is a 72 year old man with a history of squamous cell carcinoma of the base of the tongue, tobacco abuse, marijuana use, lymphedema, PAD, syncope, and a stage IV thymoma.   Thymoma-he saw Dr. Bertis Ruddy and Dr. Basilio Cairo.  He is going to have adjuvant radiation with Dr. Basilio Cairo.  Status post thymectomy-not having pain.  His biggest issue postoperatively has been constipation.  Also was found to have a duodenal ulcer and severe anemia.  Constipation has improved.  Still feels weak and tired and has lost a significant amount of weight.  Slight elevation of the left hemidiaphragm.  Likely will recover over time but not particularly symptomatic.  Plan: Follow-up with Dr. Bertis Ruddy and Dr. Basilio Cairo Follow-up with Dr. Elnoria Howard of gastroenterology I will plan on seeing him back in about 6 months to check on his progress.  [name], MD [facility] [phone]

## 2023-04-22 ENCOUNTER — Encounter: Payer: Self-pay | Admitting: Cardiology

## 2023-04-22 NOTE — Progress Notes (Signed)
Notice of Potential Device Damage   Patient Name: Angel [name]   Date of Birth: 01-31-2051    Device Information: Implanted cardiac monitor   Loop-recorder: [employer] Assert IQ     The patient listed above is scheduled to receive radiation therapy, which may adversely affect the device listed  above. Every reasonable effort will be made to minimize the radiation dose to the device, including exposure to  neutrons. Though unlikely, possible damage to the battery and electronic components, such as memory storage and  programming, is possible.   Your office may consider preserving valuable patient monitoring information stored on the device prior to the start of  radiation therapy and checking the device programming after the patient completes radiation therapy.    TO BE COMPLETED BY RADIATION ONCOLOGIST'S OFFICE   Scheduled start date of radiation therapy: 04/24/2023   Scheduled end date of radiation therapy: 06/03/2023   Site to be treated: Thymic Carcinoma   Radiation Oncologist: Dr. Lonie Peak    TO BE COMPLETED BY RECIPIENT'S OFFICE   **By signing you are indicating that you've received this notice and no further action is needed:   Physician's signature and date:______________________________________________________   CARDIOLOGIST SIGNATURE:  Dr. Nobie Putnam  Per Device Clinic Standing Orders,  Angel [name]  04/08/2023  11:21 AM    **Please call [phone] if you have questions for Radiation Oncologist and are unable to sign form   Fax form back to [phone]

## 2023-04-24 ENCOUNTER — Ambulatory Visit
Admission: RE | Admit: 2023-04-24 | Discharge: 2023-04-24 | Disposition: A | Discharge status: Home / Self Care | Payer: Medicare HMO | Source: Ambulatory Visit | Attending: Radiation Oncology | Admitting: Radiation Oncology

## 2023-04-24 ENCOUNTER — Other Ambulatory Visit: Payer: Self-pay

## 2023-04-24 DIAGNOSIS — R55 Syncope and collapse: Secondary | ICD-10-CM | POA: Diagnosis not present

## 2023-04-24 DIAGNOSIS — Z510 Encounter for antineoplastic radiation therapy: Secondary | ICD-10-CM | POA: Diagnosis not present

## 2023-04-24 DIAGNOSIS — C37 Malignant neoplasm of thymus: Secondary | ICD-10-CM | POA: Diagnosis not present

## 2023-04-24 LAB — RAD ONC ARIA SESSION SUMMARY
Course Elapsed Days: 0
Plan Fractions Treated to Date: 1
Plan Prescribed Dose Per Fraction: 1.8 Gy
Plan Total Fractions Prescribed: 28
Plan Total Prescribed Dose: 50.4 Gy
Reference Point Dosage Given to Date: 1.8 Gy
Reference Point Session Dosage Given: 1.8 Gy
Session Number: 1

## 2023-04-25 ENCOUNTER — Telehealth: Payer: Self-pay

## 2023-04-25 ENCOUNTER — Ambulatory Visit (INDEPENDENT_AMBULATORY_CARE_PROVIDER_SITE_OTHER): Payer: Medicare HMO

## 2023-04-25 ENCOUNTER — Other Ambulatory Visit: Payer: Self-pay

## 2023-04-25 ENCOUNTER — Ambulatory Visit
Admission: RE | Admit: 2023-04-25 | Discharge: 2023-04-25 | Disposition: A | Discharge status: Home / Self Care | Payer: Medicare HMO | Source: Ambulatory Visit | Attending: Radiation Oncology | Admitting: Radiation Oncology

## 2023-04-25 ENCOUNTER — Ambulatory Visit
Admission: RE | Admit: 2023-04-25 | Discharge: 2023-04-25 | Disposition: A | Discharge status: Home / Self Care | Payer: Medicare HMO | Source: Ambulatory Visit | Attending: Radiation Oncology

## 2023-04-25 DIAGNOSIS — C37 Malignant neoplasm of thymus: Secondary | ICD-10-CM

## 2023-04-25 DIAGNOSIS — Z510 Encounter for antineoplastic radiation therapy: Secondary | ICD-10-CM | POA: Diagnosis not present

## 2023-04-25 DIAGNOSIS — R55 Syncope and collapse: Secondary | ICD-10-CM

## 2023-04-25 LAB — RAD ONC ARIA SESSION SUMMARY
Course Elapsed Days: 1
Plan Fractions Treated to Date: 2
Plan Prescribed Dose Per Fraction: 1.8 Gy
Plan Total Fractions Prescribed: 28
Plan Total Prescribed Dose: 50.4 Gy
Reference Point Dosage Given to Date: 3.6 Gy
Reference Point Session Dosage Given: 1.8 Gy
Session Number: 2

## 2023-04-25 MED ORDER — SONAFINE EX EMUL
1.0000 | Freq: Two times a day (BID) | CUTANEOUS | Status: DC
  Administered 2023-04-25: 1 via TOPICAL

## 2023-04-25 NOTE — Telephone Encounter (Signed)
Pt asleep at this time. Call back after noon to schedule appointment w/ JP for elevated HR's and maybe starting anticoagulation therapy.

## 2023-04-28 ENCOUNTER — Ambulatory Visit
Admission: RE | Admit: 2023-04-28 | Discharge: 2023-04-28 | Disposition: A | Discharge status: Home / Self Care | Payer: Medicare HMO | Source: Ambulatory Visit | Attending: Radiation Oncology

## 2023-04-28 ENCOUNTER — Other Ambulatory Visit: Payer: Self-pay

## 2023-04-28 DIAGNOSIS — C37 Malignant neoplasm of thymus: Secondary | ICD-10-CM | POA: Diagnosis not present

## 2023-04-28 DIAGNOSIS — Z510 Encounter for antineoplastic radiation therapy: Secondary | ICD-10-CM | POA: Diagnosis not present

## 2023-04-28 DIAGNOSIS — K269 Duodenal ulcer, unspecified as acute or chronic, without hemorrhage or perforation: Secondary | ICD-10-CM | POA: Diagnosis not present

## 2023-04-28 DIAGNOSIS — D509 Iron deficiency anemia, unspecified: Secondary | ICD-10-CM | POA: Diagnosis not present

## 2023-04-28 DIAGNOSIS — R933 Abnormal findings on diagnostic imaging of other parts of digestive tract: Secondary | ICD-10-CM | POA: Diagnosis not present

## 2023-04-28 LAB — CUP PACEART REMOTE DEVICE CHECK
Date Time Interrogation Session: 20241229080031
Pulse Gen Serial Number: 511029626

## 2023-04-28 LAB — RAD ONC ARIA SESSION SUMMARY
Course Elapsed Days: 4
Plan Fractions Treated to Date: 3
Plan Prescribed Dose Per Fraction: 1.8 Gy
Plan Total Fractions Prescribed: 28
Plan Total Prescribed Dose: 50.4 Gy
Reference Point Dosage Given to Date: 5.4 Gy
Reference Point Session Dosage Given: 1.8 Gy
Session Number: 3

## 2023-04-28 NOTE — Telephone Encounter (Signed)
Attempted to contact Pt.  Call went to VM, unable to leave a message.

## 2023-04-29 ENCOUNTER — Ambulatory Visit
Admission: RE | Admit: 2023-04-29 | Discharge: 2023-04-29 | Disposition: A | Discharge status: Home / Self Care | Payer: Medicare HMO | Source: Ambulatory Visit | Attending: Radiation Oncology | Admitting: Radiation Oncology

## 2023-04-29 ENCOUNTER — Other Ambulatory Visit: Payer: Self-pay | Admitting: Gastroenterology

## 2023-04-29 ENCOUNTER — Other Ambulatory Visit: Payer: Self-pay

## 2023-04-29 DIAGNOSIS — R9389 Abnormal findings on diagnostic imaging of other specified body structures: Secondary | ICD-10-CM

## 2023-04-29 DIAGNOSIS — C37 Malignant neoplasm of thymus: Secondary | ICD-10-CM | POA: Diagnosis not present

## 2023-04-29 DIAGNOSIS — Z510 Encounter for antineoplastic radiation therapy: Secondary | ICD-10-CM | POA: Diagnosis not present

## 2023-04-29 DIAGNOSIS — K8689 Other specified diseases of pancreas: Secondary | ICD-10-CM

## 2023-04-29 LAB — RAD ONC ARIA SESSION SUMMARY
Course Elapsed Days: 5
Plan Fractions Treated to Date: 4
Plan Prescribed Dose Per Fraction: 1.8 Gy
Plan Total Fractions Prescribed: 28
Plan Total Prescribed Dose: 50.4 Gy
Reference Point Dosage Given to Date: 7.2 Gy
Reference Point Session Dosage Given: 1.8 Gy
Session Number: 4

## 2023-05-01 ENCOUNTER — Ambulatory Visit
Admission: RE | Admit: 2023-05-01 | Discharge: 2023-05-01 | Disposition: A | Discharge status: Home / Self Care | Payer: Medicare HMO | Source: Ambulatory Visit | Attending: Radiation Oncology | Admitting: Radiation Oncology

## 2023-05-01 ENCOUNTER — Other Ambulatory Visit: Payer: Self-pay

## 2023-05-01 DIAGNOSIS — Z510 Encounter for antineoplastic radiation therapy: Secondary | ICD-10-CM | POA: Diagnosis not present

## 2023-05-01 DIAGNOSIS — C37 Malignant neoplasm of thymus: Secondary | ICD-10-CM | POA: Insufficient documentation

## 2023-05-01 LAB — RAD ONC ARIA SESSION SUMMARY
Course Elapsed Days: 7
Plan Fractions Treated to Date: 5
Plan Prescribed Dose Per Fraction: 1.8 Gy
Plan Total Fractions Prescribed: 28
Plan Total Prescribed Dose: 50.4 Gy
Reference Point Dosage Given to Date: 9 Gy
Reference Point Session Dosage Given: 1.8 Gy
Session Number: 5

## 2023-05-01 NOTE — Telephone Encounter (Signed)
 Mychart message sent.

## 2023-05-02 ENCOUNTER — Other Ambulatory Visit: Payer: Self-pay

## 2023-05-02 ENCOUNTER — Ambulatory Visit
Admission: RE | Admit: 2023-05-02 | Discharge: 2023-05-02 | Disposition: A | Discharge status: Home / Self Care | Payer: Medicare HMO | Source: Ambulatory Visit | Attending: Radiation Oncology

## 2023-05-02 DIAGNOSIS — R531 Weakness: Secondary | ICD-10-CM | POA: Diagnosis not present

## 2023-05-02 DIAGNOSIS — C37 Malignant neoplasm of thymus: Secondary | ICD-10-CM | POA: Diagnosis not present

## 2023-05-02 DIAGNOSIS — Z510 Encounter for antineoplastic radiation therapy: Secondary | ICD-10-CM | POA: Diagnosis not present

## 2023-05-02 DIAGNOSIS — E440 Moderate protein-calorie malnutrition: Secondary | ICD-10-CM | POA: Diagnosis not present

## 2023-05-02 LAB — RAD ONC ARIA SESSION SUMMARY
Course Elapsed Days: 8
Plan Fractions Treated to Date: 6
Plan Prescribed Dose Per Fraction: 1.8 Gy
Plan Total Fractions Prescribed: 28
Plan Total Prescribed Dose: 50.4 Gy
Reference Point Dosage Given to Date: 10.8 Gy
Reference Point Session Dosage Given: 1.8 Gy
Session Number: 6

## 2023-05-04 DIAGNOSIS — R55 Syncope and collapse: Secondary | ICD-10-CM | POA: Diagnosis not present

## 2023-05-05 ENCOUNTER — Other Ambulatory Visit: Payer: Self-pay

## 2023-05-05 ENCOUNTER — Inpatient Hospital Stay (HOSPITAL_BASED_OUTPATIENT_CLINIC_OR_DEPARTMENT_OTHER): Payer: Medicare HMO | Admitting: Hematology and Oncology

## 2023-05-05 ENCOUNTER — Ambulatory Visit
Admission: RE | Admit: 2023-05-05 | Discharge: 2023-05-05 | Disposition: A | Discharge status: Home / Self Care | Payer: Medicare HMO | Source: Ambulatory Visit | Attending: Radiation Oncology | Admitting: Radiation Oncology

## 2023-05-05 ENCOUNTER — Encounter: Payer: Self-pay | Admitting: Hematology and Oncology

## 2023-05-05 VITALS — BP 153/101 | HR 85 | Temp 97.4°F | Resp 18 | Ht 65.0 in | Wt 117.8 lb

## 2023-05-05 DIAGNOSIS — C37 Malignant neoplasm of thymus: Secondary | ICD-10-CM

## 2023-05-05 DIAGNOSIS — Z79899 Other long term (current) drug therapy: Secondary | ICD-10-CM | POA: Insufficient documentation

## 2023-05-05 DIAGNOSIS — E440 Moderate protein-calorie malnutrition: Secondary | ICD-10-CM | POA: Insufficient documentation

## 2023-05-05 DIAGNOSIS — R531 Weakness: Secondary | ICD-10-CM | POA: Insufficient documentation

## 2023-05-05 DIAGNOSIS — Z510 Encounter for antineoplastic radiation therapy: Secondary | ICD-10-CM | POA: Diagnosis not present

## 2023-05-05 LAB — RAD ONC ARIA SESSION SUMMARY
Course Elapsed Days: 11
Plan Fractions Treated to Date: 7
Plan Prescribed Dose Per Fraction: 1.8 Gy
Plan Total Fractions Prescribed: 28
Plan Total Prescribed Dose: 50.4 Gy
Reference Point Dosage Given to Date: 12.6 Gy
Reference Point Session Dosage Given: 1.8 Gy
Session Number: 7

## 2023-05-05 NOTE — Assessment & Plan Note (Signed)
 He has gained a bit of weight but is only eating 1-2 meals a day He has nutritional supplement at home but is not drinking it Recommend nutritional consult in the outpatient clinic

## 2023-05-05 NOTE — Assessment & Plan Note (Signed)
 So far, he tolerated radiation therapy well without major side effects He will continue treatment as scheduled I will see him at the end of treatment next month and I will arrange follow-up imaging

## 2023-05-05 NOTE — Progress Notes (Signed)
 err

## 2023-05-05 NOTE — Progress Notes (Signed)
 [facility] OFFICE PROGRESS NOTE  Patient Care Team: Angel Pagan, NP as PCP - General  ASSESSMENT & PLAN:  Thymic carcinoma [facility]) So far, he tolerated radiation therapy well without major side effects He will continue treatment as scheduled I will see him at the end of treatment next month and I will arrange follow-up imaging  Protein-calorie malnutrition, moderate (HCC) He has gained a bit of weight but is only eating 1-2 meals a day He has nutritional supplement at home but is not drinking it Recommend nutritional consult in the outpatient clinic  Generalized weakness His weakness has improved I have placed physical therapy referral last month but according to the patient, nobody has called We will call PT department for consult  No orders of the defined types were placed in this encounter.   All questions were answered. The patient knows to call the clinic with any problems, questions or concerns. The total time spent in the appointment was 25 minutes encounter with patients including review of chart and various tests results, discussions about plan of care and coordination of care plan   Angel Bedford, MD 05/05/2023 3:36 PM  INTERVAL HISTORY: Please see below for problem oriented charting. he returns for surveillance follow-up while undergoing radiation therapy for thymic cancer He is doing well Denies recent dysphagia He has gained weight since the last time I saw him but is still eating minimal amount of food His blood pressure is noted to be elevated but the patient has not been checking his blood pressure at home  REVIEW OF SYSTEMS:   Constitutional: Denies fevers, chills or abnormal weight loss Eyes: Denies blurriness of vision Ears, nose, mouth, throat, and face: Denies mucositis or sore throat Respiratory: Denies cough, dyspnea or wheezes Cardiovascular: Denies palpitation, chest discomfort or lower extremity swelling Gastrointestinal:  Denies nausea,  heartburn or change in bowel habits Skin: Denies abnormal skin rashes Lymphatics: Denies new lymphadenopathy or easy bruising Neurological:Denies numbness, tingling or new weaknesses Behavioral/Psych: Mood is stable, no new changes  All other systems were reviewed with the patient and are negative.  I have reviewed the past medical history, past surgical history, social history and family history with the patient and they are unchanged from previous note.  ALLERGIES:  has no allergies on file.  MEDICATIONS:  Current Outpatient Medications  Medication Sig Dispense Refill   albuterol  (VENTOLIN  HFA) 108 (90 Base) MCG/ACT inhaler Inhale 2 puffs into the lungs every 6 (six) hours as needed for wheezing or shortness of breath. 8 g 2   aspirin  EC 81 MG tablet Take 1 tablet (81 mg total) by mouth daily. Swallow whole. 150 tablet 0   Cholecalciferol  (VITAMIN D3) 50 MCG (2000 UT) TABS Take 2,000 Units by mouth daily.     clopidogrel  (PLAVIX ) 75 MG tablet Take 1 tablet (75 mg total) by mouth daily.     cyanocobalamin  (VITAMIN B12) 1000 MCG tablet Take 1 tablet (1,000 mcg total) by mouth daily. 60 tablet 0   docusate sodium  (COLACE) 100 MG capsule Take 1 capsule (100 mg total) by mouth 2 (two) times daily. 10 capsule 0   ferrous sulfate  325 (65 FE) MG EC tablet Take 1 tablet (325 mg total) by mouth 2 (two) times daily. 60 tablet 3   gabapentin  (NEURONTIN ) 300 MG capsule Take 1 capsule (300 mg total) by mouth at bedtime. 30 capsule 0   guaiFENesin  (MUCINEX ) 600 MG 12 hr tablet Take 1 tablet (600 mg total) by mouth 2 (two) times  daily. 60 tablet 2   levothyroxine  (SYNTHROID ) 88 MCG tablet Take 88 mcg by mouth daily.     losartan  (COZAAR ) 25 MG tablet Take 1 tablet by mouth in the evening 30 tablet 9   methocarbamol  (ROBAXIN ) 500 MG tablet Take 500 mg by mouth every 6 (six) hours as needed for muscle spasms.     Multiple Vitamin (MULTIVITAMIN WITH MINERALS) TABS tablet Take 1 tablet by mouth daily.  Centrum 50+     pantoprazole  (PROTONIX ) 40 MG tablet Take 1 tablet (40 mg total) by mouth 2 (two) times daily before a meal. 60 tablet 0   polyethylene glycol (MIRALAX  / GLYCOLAX ) 17 g packet Take 17 g by mouth daily. 14 each 0   rosuvastatin  (CRESTOR ) 20 MG tablet Take 1 tablet by mouth once daily 90 tablet 0   No current facility-administered medications for this visit.    SUMMARY OF ONCOLOGIC HISTORY: Oncology History Overview Note  Cancer of base of tongue, HPV negative   Primary site: Pharynx - Oropharynx   Staging method: AJCC 7th Edition   Clinical: Stage IVB (T2, N3, M0) signed by Angel Bedford, MD on 10/15/2013 10:25 AM   Summary: Stage IVB (T2, N3, M0)     Cancer of base of tongue (HCC)  09/17/2013 Imaging   CT scan of the neck showed large bilateral neck lymphadenopathy with associated tongue mass.   09/23/2013 Procedure   Fine needle aspirate of the left neck mass came back positive for squamous cell carcinoma.   10/04/2013 Surgery   Laryngoscopy showed a firm mass within the left tongue base extending past the midline and encompassing much of the tongue base.  There is extension of the mass into the vallecula and on to the lingual surface of the epiglottis.   10/04/2013 Pathology Results   Vallecula and tongue biopsy confirmed squamous cell carcinoma.   10/15/2013 Imaging   PET/CT scan showed no evidence of distant metastatic disease apart from just regional lymph node involvement.   10/25/2013 - 11/01/2013 Hospital Admission   He was admitted to the hospital due to malignant hypercalcemia and treatment was initiated.   10/27/2013 - 12/13/2013 Chemotherapy   He was given 3 cycles of induction chemo with cisplatin , taxotere , 5 FU   11/03/2013 - 11/07/2013 Hospital Admission   He was hospitalized for dehydration   12/31/2013 Procedure   PICC line was removed   01/10/2014 Imaging   PET Ct scan showed near complete response to treatment   01/26/2014 - 03/18/2014 Radiation Therapy    Treated with helical IMRT tomotherapy:  Base of tongue and bilateral neck / 70 Gy in 35 fractions to gross disease, 63 Gy in 35 fractions to high risk nodal echelons, and 56 Gy in 35 fractions to intermediate risk nodal echelons.   07/14/2014 Imaging   CT scan show near complete response to treatment but new lung nodule   09/22/2014 Imaging   CT Neck - No new adenopathy or nodes with increasing size.  No evidence for residual or recurrent primary tumor.    09/22/2014 Imaging   CT Chest - Resolution of left upper lobe nodule, likely infectious or inflammatory.  No acute process or evidence of metastatic disease in chest.    12/23/2014 Imaging   CT Neck - Extensive radiation changes in the neck are stable.  6 mm calcified left level 3 node is stable. No new adenopathy or mass.    Thymic carcinoma (HCC)  07/28/2022 Imaging   1. New 4.8 cm x  4.0 cm low-attenuation anterior mediastinal mass with an ill-defined component of mildly increased attenuation. This could reflect a complex thymic cyst, although a cystic neoplasm cannot be excluded. MRI correlation is recommended. 2. No significant extracardiac findings within the visualized chest   01/23/2023 Imaging   1. Complex 5.4 x 3.6 x 5.7 cm cystic left anterior mediastinal mass with enhancing solid 2.4 x 2.0 cm mural nodule superiorly, minimally increased in size since 07/26/2022 CT and new from 08/18/2015 CT. Cystic thymic malignancy favored. Thoracic surgical consultation advised. 2. No findings of metastatic disease in the chest. 3. One vessel coronary atherosclerosis. 4. Moderate centrilobular emphysema with diffuse bronchial wall thickening and saber sheath trachea, suggesting COPD. 5.  Emphysema (ICD10-J43.9).   03/03/2023 Pathology Results   SURGICAL PATHOLOGY CASE: [id number] PATIENT: Angel [name] Surgical Pathology Report  Clinical History: anterior mediastinal mass (cm)  FINAL MICROSCOPIC DIAGNOSIS:  A. 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 Dr. LeGolvan reviewed the case and concurs with the above diagnosis.  ONCOLOGY TABLE:  THYMUS: Resection  Procedure: Thymectomy Tumor Site: Thymus Histologic Type: Thymic squamous cell carcinoma Tumor Size: 7.3 cm Lymphovascular Invasion: Not identified Treatment Effect: No known presurgical therapy Site(s) Involved by Direct Tumor Invasion: Not applicable Margins: All margins negative for tumor Regional Lymph Nodes:      Number of Lymph Nodes with Tumor: 1      Number of Lymph Nodes Examined: 2 Distant Metastasis:      Distant Site(s) Involved: Not applicable Pathologic Stage Classification (pTNM, AJCC 8th Edition): pT1a, pN1 Ancillary Studies: Can be performed up request Representative Tumor Block: A1  (v4.1.0.1)     03/03/2023 Surgery   Operative Report    DATE OF PROCEDURE: 03/03/2023   PREOPERATIVE DIAGNOSIS:  Anterior mediastinal mass, suspect thymoma.   POSTOPERATIVE DIAGNOSIS:  Anterior mediastinal mass, suspect thymoma.   PROCEDURE:  Xi robotic-assisted left VATS,  Thymectomy.   SURGEON:  Elspeth BROCKS. Kerrin, MD   ASSISTANT:  Kyla Donald, PA.   ANESTHESIA:  General.   FINDINGS:  Large mass directly adjacent, but not invading the phrenic nerve.  Adhesions to left upper lobe necessitating en bloc wedge resection.   03/24/2023 Imaging   1. Generalized mildly prominent fluid-filled small bowel loops without discrete small bowel caliber transition. Fluid levels throughout the right, transverse and descending colon. No bowel wall thickening. Findings are nonspecific but most suggestive of mild adynamic ileus such as due to  enterocolitis. 2. Minimal sigmoid diverticulosis. 3. Chronic diffuse pancreatic duct dilation up to 0.5 cm diameter, mildly increased from 0.3 cm on 08/25/2015 CT. No discrete pancreatic mass. Suggest nonemergent outpatient MRI abdomen with MRCP without and with IV contrast for further evaluation. 4. Trace layering left pleural effusion. Mild elevation of the left hemidiaphragm with platelike mild-to-moderate left lung base atelectasis. 5.  Aortic Atherosclerosis (ICD10-I70.0).   03/25/2023 Initial Diagnosis   Thymic carcinoma (HCC)   03/25/2023 Cancer Staging   Staging form: Thymus, AJCC 8th Edition - Pathologic stage from 03/25/2023: Stage IVA (pT1a, pN1, cM0) - Signed by Lonn Hicks, MD on 03/25/2023 Stage prefix: Initial diagnosis     PHYSICAL EXAMINATION: ECOG PERFORMANCE STATUS: 1 - Symptomatic but completely ambulatory  Vitals:  05/05/23 1520  BP: (!) 153/101  Pulse: 85  Resp: 18  Temp: (!) 97.4 F (36.3 C)  SpO2: 96%   Filed Weights   05/05/23 1520  Weight: 117 lb 12.8 oz (53.4 kg)    GENERAL:alert, no distress and comfortable NEURO: alert & oriented x 3 with fluent speech, no focal motor/sensory deficits  LABORATORY DATA:  I have reviewed the data as listed    Component Value Date/Time   NA 141 03/26/2023 0448   NA 143 10/28/2022 0846   NA 140 02/14/2017 0851   K 3.6 03/26/2023 0448   K 5.1 02/14/2017 0851   CL 106 03/26/2023 0448   CO2 26 03/26/2023 0448   CO2 27 02/14/2017 0851   GLUCOSE 80 03/26/2023 0448   GLUCOSE 117 02/14/2017 0851   BUN 12 03/26/2023 0448   BUN 4 (L) 10/28/2022 0846   BUN 7.9 02/14/2017 0851   CREATININE 0.88 03/26/2023 0448   CREATININE 1.09 02/12/2018 0943   CREATININE 0.9 02/14/2017 0851   CALCIUM  8.6 (L) 03/26/2023 0448   CALCIUM  9.7 02/14/2017 0851   PROT 7.3 03/24/2023 1608   PROT 7.4 02/14/2017 0851   ALBUMIN  3.9 03/24/2023 1608   ALBUMIN  4.3 02/14/2017 0851   AST 21 03/24/2023 1608   AST 20 02/12/2018 0943   AST  18 02/14/2017 0851   ALT 14 03/24/2023 1608   ALT 12 02/12/2018 0943   ALT 9 02/14/2017 0851   ALKPHOS 59 03/24/2023 1608   ALKPHOS 59 02/14/2017 0851   BILITOT 0.6 03/24/2023 1608   BILITOT 0.5 02/12/2018 0943   BILITOT 0.33 02/14/2017 0851   GFRNONAA >60 03/26/2023 0448   GFRNONAA >60 02/12/2018 0943   GFRAA >60 08/12/2019 2107   GFRAA >60 02/12/2018 0943    No results found for: SPEP, UPEP  Lab Results  Component Value Date   WBC 8.6 04/09/2023   NEUTROABS 6.0 04/09/2023   HGB 9.3 (L) 04/09/2023   HCT 31.6 (L) 04/09/2023   MCV 79.2 (L) 04/09/2023   PLT 629 (H) 04/09/2023      Chemistry      Component Value Date/Time   NA 141 03/26/2023 0448   NA 143 10/28/2022 0846   NA 140 02/14/2017 0851   K 3.6 03/26/2023 0448   K 5.1 02/14/2017 0851   CL 106 03/26/2023 0448   CO2 26 03/26/2023 0448   CO2 27 02/14/2017 0851   BUN 12 03/26/2023 0448   BUN 4 (L) 10/28/2022 0846   BUN 7.9 02/14/2017 0851   CREATININE 0.88 03/26/2023 0448   CREATININE 1.09 02/12/2018 0943   CREATININE 0.9 02/14/2017 0851      Component Value Date/Time   CALCIUM  8.6 (L) 03/26/2023 0448   CALCIUM  9.7 02/14/2017 0851   ALKPHOS 59 03/24/2023 1608   ALKPHOS 59 02/14/2017 0851   AST 21 03/24/2023 1608   AST 20 02/12/2018 0943   AST 18 02/14/2017 0851   ALT 14 03/24/2023 1608   ALT 12 02/12/2018 0943   ALT 9 02/14/2017 0851   BILITOT 0.6 03/24/2023 1608   BILITOT 0.5 02/12/2018 0943   BILITOT 0.33 02/14/2017 9148

## 2023-05-05 NOTE — Assessment & Plan Note (Signed)
 His weakness has improved I have placed physical therapy referral last month but according to the patient, nobody has called We will call PT department for consult

## 2023-05-06 ENCOUNTER — Telehealth: Payer: Self-pay | Admitting: Nutrition

## 2023-05-06 ENCOUNTER — Ambulatory Visit
Admission: RE | Admit: 2023-05-06 | Discharge: 2023-05-06 | Disposition: A | Discharge status: Home / Self Care | Payer: Medicare HMO | Source: Ambulatory Visit | Attending: Radiation Oncology

## 2023-05-06 ENCOUNTER — Other Ambulatory Visit: Payer: Self-pay

## 2023-05-06 DIAGNOSIS — C37 Malignant neoplasm of thymus: Secondary | ICD-10-CM | POA: Diagnosis not present

## 2023-05-06 DIAGNOSIS — Z510 Encounter for antineoplastic radiation therapy: Secondary | ICD-10-CM | POA: Diagnosis not present

## 2023-05-06 LAB — RAD ONC ARIA SESSION SUMMARY
Course Elapsed Days: 12
Plan Fractions Treated to Date: 8
Plan Prescribed Dose Per Fraction: 1.8 Gy
Plan Total Fractions Prescribed: 28
Plan Total Prescribed Dose: 50.4 Gy
Reference Point Dosage Given to Date: 14.4 Gy
Reference Point Session Dosage Given: 1.8 Gy
Session Number: 8

## 2023-05-06 NOTE — Telephone Encounter (Signed)
 Correction to previous note. Unable to leave VM d/t mailbox full.

## 2023-05-06 NOTE — Telephone Encounter (Signed)
 Attempted to call pt. Phone was sent to VM. LMTCB.

## 2023-05-06 NOTE — Telephone Encounter (Signed)
 Scheduled appointment per scheduling message. Patient is aware of the made appointment and will be mailed an appointment reminder.

## 2023-05-07 ENCOUNTER — Ambulatory Visit
Admission: RE | Admit: 2023-05-07 | Discharge: 2023-05-07 | Disposition: A | Discharge status: Home / Self Care | Payer: Medicare HMO | Source: Ambulatory Visit | Attending: Radiation Oncology | Admitting: Radiation Oncology

## 2023-05-07 ENCOUNTER — Other Ambulatory Visit: Payer: Self-pay

## 2023-05-07 ENCOUNTER — Ambulatory Visit: Payer: Medicare HMO | Attending: Hematology and Oncology | Admitting: Physical Therapy

## 2023-05-07 DIAGNOSIS — C37 Malignant neoplasm of thymus: Secondary | ICD-10-CM | POA: Diagnosis not present

## 2023-05-07 DIAGNOSIS — Z510 Encounter for antineoplastic radiation therapy: Secondary | ICD-10-CM | POA: Diagnosis not present

## 2023-05-07 LAB — RAD ONC ARIA SESSION SUMMARY
Course Elapsed Days: 13
Plan Fractions Treated to Date: 9
Plan Prescribed Dose Per Fraction: 1.8 Gy
Plan Total Fractions Prescribed: 28
Plan Total Prescribed Dose: 50.4 Gy
Reference Point Dosage Given to Date: 16.2 Gy
Reference Point Session Dosage Given: 1.8 Gy
Session Number: 9

## 2023-05-07 NOTE — Telephone Encounter (Signed)
 Outreach made to Pt.  Spoke with wife.  Follow up scheduled with Dr. Jacinto Halim 05/18/2022 to discuss afib.  Soonest appt available.

## 2023-05-08 ENCOUNTER — Ambulatory Visit
Admission: RE | Admit: 2023-05-08 | Discharge: 2023-05-08 | Disposition: A | Discharge status: Home / Self Care | Payer: Medicare HMO | Source: Ambulatory Visit | Attending: Radiation Oncology | Admitting: Radiation Oncology

## 2023-05-08 ENCOUNTER — Other Ambulatory Visit: Payer: Self-pay

## 2023-05-08 DIAGNOSIS — Z510 Encounter for antineoplastic radiation therapy: Secondary | ICD-10-CM | POA: Diagnosis not present

## 2023-05-08 DIAGNOSIS — C37 Malignant neoplasm of thymus: Secondary | ICD-10-CM | POA: Diagnosis not present

## 2023-05-08 LAB — RAD ONC ARIA SESSION SUMMARY
Course Elapsed Days: 14
Plan Fractions Treated to Date: 10
Plan Prescribed Dose Per Fraction: 1.8 Gy
Plan Total Fractions Prescribed: 28
Plan Total Prescribed Dose: 50.4 Gy
Reference Point Dosage Given to Date: 18 Gy
Reference Point Session Dosage Given: 1.8 Gy
Session Number: 10

## 2023-05-09 ENCOUNTER — Ambulatory Visit: Payer: Medicare HMO

## 2023-05-12 ENCOUNTER — Ambulatory Visit
Admission: RE | Admit: 2023-05-12 | Discharge: 2023-05-12 | Disposition: A | Discharge status: Home / Self Care | Payer: Medicare HMO | Source: Ambulatory Visit | Attending: Radiation Oncology

## 2023-05-12 ENCOUNTER — Ambulatory Visit: Payer: Medicare HMO

## 2023-05-12 ENCOUNTER — Other Ambulatory Visit: Payer: Self-pay

## 2023-05-12 DIAGNOSIS — C37 Malignant neoplasm of thymus: Secondary | ICD-10-CM | POA: Diagnosis not present

## 2023-05-12 DIAGNOSIS — Z510 Encounter for antineoplastic radiation therapy: Secondary | ICD-10-CM | POA: Diagnosis not present

## 2023-05-12 LAB — RAD ONC ARIA SESSION SUMMARY
Course Elapsed Days: 18
Plan Fractions Treated to Date: 11
Plan Prescribed Dose Per Fraction: 1.8 Gy
Plan Total Fractions Prescribed: 28
Plan Total Prescribed Dose: 50.4 Gy
Reference Point Dosage Given to Date: 19.8 Gy
Reference Point Session Dosage Given: 1.8 Gy
Session Number: 11

## 2023-05-13 ENCOUNTER — Other Ambulatory Visit: Payer: Self-pay

## 2023-05-13 ENCOUNTER — Ambulatory Visit
Admission: RE | Admit: 2023-05-13 | Discharge: 2023-05-13 | Disposition: A | Discharge status: Home / Self Care | Payer: Medicare HMO | Source: Ambulatory Visit | Attending: Radiation Oncology

## 2023-05-13 DIAGNOSIS — Z510 Encounter for antineoplastic radiation therapy: Secondary | ICD-10-CM | POA: Diagnosis not present

## 2023-05-13 DIAGNOSIS — C37 Malignant neoplasm of thymus: Secondary | ICD-10-CM | POA: Diagnosis not present

## 2023-05-13 LAB — RAD ONC ARIA SESSION SUMMARY
Course Elapsed Days: 19
Plan Fractions Treated to Date: 12
Plan Prescribed Dose Per Fraction: 1.8 Gy
Plan Total Fractions Prescribed: 28
Plan Total Prescribed Dose: 50.4 Gy
Reference Point Dosage Given to Date: 21.6 Gy
Reference Point Session Dosage Given: 1.8 Gy
Session Number: 12

## 2023-05-14 ENCOUNTER — Other Ambulatory Visit: Payer: Self-pay

## 2023-05-14 ENCOUNTER — Ambulatory Visit
Admission: RE | Admit: 2023-05-14 | Discharge: 2023-05-14 | Disposition: A | Discharge status: Home / Self Care | Payer: Medicare HMO | Source: Ambulatory Visit | Attending: Radiation Oncology

## 2023-05-14 DIAGNOSIS — C37 Malignant neoplasm of thymus: Secondary | ICD-10-CM | POA: Diagnosis not present

## 2023-05-14 DIAGNOSIS — Z510 Encounter for antineoplastic radiation therapy: Secondary | ICD-10-CM | POA: Diagnosis not present

## 2023-05-14 LAB — RAD ONC ARIA SESSION SUMMARY
Course Elapsed Days: 20
Plan Fractions Treated to Date: 13
Plan Prescribed Dose Per Fraction: 1.8 Gy
Plan Total Fractions Prescribed: 28
Plan Total Prescribed Dose: 50.4 Gy
Reference Point Dosage Given to Date: 23.4 Gy
Reference Point Session Dosage Given: 1.8 Gy
Session Number: 13

## 2023-05-15 ENCOUNTER — Inpatient Hospital Stay: Payer: Medicare HMO | Admitting: Nutrition

## 2023-05-15 ENCOUNTER — Other Ambulatory Visit: Payer: Self-pay

## 2023-05-15 ENCOUNTER — Ambulatory Visit
Admission: RE | Admit: 2023-05-15 | Discharge: 2023-05-15 | Disposition: A | Discharge status: Home / Self Care | Payer: Medicare HMO | Source: Ambulatory Visit | Attending: Radiation Oncology | Admitting: Radiation Oncology

## 2023-05-15 DIAGNOSIS — Z510 Encounter for antineoplastic radiation therapy: Secondary | ICD-10-CM | POA: Diagnosis not present

## 2023-05-15 DIAGNOSIS — C37 Malignant neoplasm of thymus: Secondary | ICD-10-CM | POA: Diagnosis not present

## 2023-05-15 LAB — RAD ONC ARIA SESSION SUMMARY
Course Elapsed Days: 21
Plan Fractions Treated to Date: 14
Plan Prescribed Dose Per Fraction: 1.8 Gy
Plan Total Fractions Prescribed: 28
Plan Total Prescribed Dose: 50.4 Gy
Reference Point Dosage Given to Date: 25.2 Gy
Reference Point Session Dosage Given: 1.8 Gy
Session Number: 14

## 2023-05-15 NOTE — Progress Notes (Signed)
73 year old male diagnosed with thymic cancer and followed by Dr. Bertis Ruddy and Dr. Basilio Cairo.  He is receiving radiation therapy with final treatment scheduled on February 5.  Past medical history includes hypertension, hypothyroidism, and tongue cancer in 2015, radiation therapy.  Medications include vitamin D3, vitamin B12, Colace, ferrous sulfate, Synthroid, multivitamin, Protonix, MiraLAX.  Labs were reviewed.  Height: 65 inches. Weight: 117.8 pounds BMI: 19.6. Patient weighed 133 pounds in August, 2024.  Patient familiar from previous cancer diagnosis.  Patient presents today with his wife.  He currently denies nausea, vomiting, diarrhea, and constipation.  He feels well.  He describes early satiety.  24-hour recall reveals patient ate cereal at breakfast, cookies or crackers at lunch, and 2 peanut butter and jelly sandwiches for dinner.  He drinks coffee.  He does not care for Ensure products.  He does like Carnation breakfast essentials and has plan to begin using these more often.  He verbalizes he wants to gain weight.  Nutrition diagnosis:  Food and nutrition related knowledge deficit related to thymic cancer and associated treatments as evidenced by no prior need for nutrition related information.  Intervention: Encouraged patient to eat small amounts of foods 6 times daily. Increase Carnation breakfast essentials to 2 servings daily.  Provided samples. Reviewed ways to get increased calories and protein and provided nutrition facts sheets. Questions answered.  Monitoring, evaluation, goals: Patient will tolerate adequate calories and protein to minimize weight loss.    Follow-up is scheduled on Tuesday, January 28 after radiation therapy.  **Disclaimer: This note was dictated with voice recognition software. Similar sounding words can inadvertently be transcribed and this note may contain transcription errors which may not have been corrected upon publication of note.**

## 2023-05-16 ENCOUNTER — Ambulatory Visit
Admission: RE | Admit: 2023-05-16 | Discharge: 2023-05-16 | Disposition: A | Discharge status: Home / Self Care | Payer: Medicare HMO | Source: Ambulatory Visit | Attending: Radiation Oncology | Admitting: Radiation Oncology

## 2023-05-16 ENCOUNTER — Other Ambulatory Visit: Payer: Self-pay

## 2023-05-16 DIAGNOSIS — Z510 Encounter for antineoplastic radiation therapy: Secondary | ICD-10-CM | POA: Diagnosis not present

## 2023-05-16 DIAGNOSIS — C37 Malignant neoplasm of thymus: Secondary | ICD-10-CM | POA: Diagnosis not present

## 2023-05-16 LAB — RAD ONC ARIA SESSION SUMMARY
Course Elapsed Days: 22
Plan Fractions Treated to Date: 15
Plan Prescribed Dose Per Fraction: 1.8 Gy
Plan Total Fractions Prescribed: 28
Plan Total Prescribed Dose: 50.4 Gy
Reference Point Dosage Given to Date: 27 Gy
Reference Point Session Dosage Given: 1.8 Gy
Session Number: 15

## 2023-05-19 ENCOUNTER — Encounter: Payer: Self-pay | Admitting: Cardiology

## 2023-05-19 ENCOUNTER — Ambulatory Visit: Payer: Medicare HMO | Attending: Cardiology | Admitting: Cardiology

## 2023-05-19 ENCOUNTER — Other Ambulatory Visit: Payer: Self-pay | Admitting: Gastroenterology

## 2023-05-19 ENCOUNTER — Other Ambulatory Visit: Payer: Self-pay

## 2023-05-19 ENCOUNTER — Ambulatory Visit
Admission: RE | Admit: 2023-05-19 | Discharge: 2023-05-19 | Disposition: A | Discharge status: Home / Self Care | Payer: Medicare HMO | Source: Ambulatory Visit | Attending: Radiation Oncology

## 2023-05-19 VITALS — BP 120/74 | HR 86 | Resp 15 | Ht 65.0 in | Wt 118.0 lb

## 2023-05-19 DIAGNOSIS — I480 Paroxysmal atrial fibrillation: Secondary | ICD-10-CM

## 2023-05-19 DIAGNOSIS — I25118 Atherosclerotic heart disease of native coronary artery with other forms of angina pectoris: Secondary | ICD-10-CM

## 2023-05-19 DIAGNOSIS — I739 Peripheral vascular disease, unspecified: Secondary | ICD-10-CM | POA: Diagnosis not present

## 2023-05-19 DIAGNOSIS — C37 Malignant neoplasm of thymus: Secondary | ICD-10-CM | POA: Diagnosis not present

## 2023-05-19 DIAGNOSIS — Z510 Encounter for antineoplastic radiation therapy: Secondary | ICD-10-CM | POA: Diagnosis not present

## 2023-05-19 LAB — RAD ONC ARIA SESSION SUMMARY
Course Elapsed Days: 25
Plan Fractions Treated to Date: 16
Plan Prescribed Dose Per Fraction: 1.8 Gy
Plan Total Fractions Prescribed: 28
Plan Total Prescribed Dose: 50.4 Gy
Reference Point Dosage Given to Date: 28.8 Gy
Reference Point Session Dosage Given: 1.8 Gy
Session Number: 16

## 2023-05-19 NOTE — Patient Instructions (Signed)
Medication Instructions:  Your physician has recommended you make the following change in your medication: Stop Aspirin  *If you need a refill on your cardiac medications before your next appointment, please call your pharmacy*   Lab Work: none If you have labs (blood work) drawn today and your tests are completely normal, you will receive your results only by: MyChart Message (if you have MyChart) OR A paper copy in the mail If you have any lab test that is abnormal or we need to change your treatment, we will call you to review the results.   Testing/Procedures: Your physician has requested that you have an ankle brachial index (ABI). During this test an ultrasound and blood pressure cuff are used to evaluate the arteries that supply the arms and legs with blood. Allow thirty minutes for this exam. There are no restrictions or special instructions.  Please note: We ask at that you not bring children with you during ultrasound (echo/ vascular) testing. Due to room size and safety concerns, children are not allowed in the ultrasound rooms during exams. Our front office staff cannot provide observation of children in our lobby area while testing is being conducted. An adult accompanying a patient to their appointment will only be allowed in the ultrasound room at the discretion of the ultrasound technician under special circumstances. We apologize for any inconvenience. Scheduled for Feb 10   Follow-Up: At [facility], you and your health needs are our priority.  As part of our continuing mission to provide you with exceptional heart care, we have created designated Provider Care Teams.  These Care Teams include your primary Cardiologist (physician) and Advanced Practice Providers (APPs -  Physician Assistants and Nurse Practitioners) who all work together to provide you with the care you need, when you need it.  We recommend signing up for the patient portal called "MyChart".  Sign  up information is provided on this After Visit Summary.  MyChart is used to connect with patients for Virtual Visits (Telemedicine).  Patients are able to view lab/test results, encounter notes, upcoming appointments, etc.  Non-urgent messages can be sent to your provider as well.   To learn more about what you can do with MyChart, go to [email or web address].    Your next appointment:   6 month(s)  Provider:   [name], MD     Other Instructions

## 2023-05-19 NOTE — Progress Notes (Signed)
Cardiology Office Note:  .   Date:  05/19/2023  ID:  Angel [name], DOB May 28, 1950, MRN [id number] PCP: Linus Galas, NP  [facility] Providers Cardiologist:  [name], MD   History of Present Illness: .   Angel [name] is a 73 y.o. extremely complex patient with longtime history of tobacco use disorder quit in 2018, still uses marijuana, COPD with moderate centrilobular emphysema, recurrent syncope SP loop recorder implantation on 10/22/2022, severe peripheral arterial disease involving bilateral lower extremity left worse than the right, primary hypertension, hypercholesterolemia, chronic back pain and diagnosis of thymic cancer for which he underwent surgical resection in November 2024.  He now presents for follow-up of peripheral arterial disease, loop recorder revealing a 30 minutes of atrial fibrillation that is new.  Discussed the use of AI scribe software for clinical note transcription with the patient, who gave verbal consent to proceed.  History of Present Illness   The patient, with a history of passing out spells, atrial fibrillation (AFib), and thymus cancer, presents for a follow-up visit. The patient had an episode of AFib about three weeks ago, which lasted for thirty minutes. The patient is currently undergoing radiation therapy for the thymus cancer and has not been recommended for chemotherapy. The patient also has a history of duodenal ulcers and has been experiencing difficulty swallowing. The patient has a loop recorder implanted for monitoring of the AFib.       Labs   Lab Results  Component Value Date   CHOL 149 10/28/2022   HDL 58 10/28/2022   LDLCALC 72 10/28/2022   TRIG 102 10/28/2022   Lab Results  Component Value Date   NA 141 03/26/2023   K 3.6 03/26/2023   CO2 26 03/26/2023   GLUCOSE 80 03/26/2023   BUN 12 03/26/2023   CREATININE 0.88 03/26/2023   CALCIUM 8.6 (L) 03/26/2023   EGFR 74 10/28/2022   GFRNONAA >60 03/26/2023      Latest Ref  Rng & Units 03/26/2023    4:48 AM 03/25/2023    4:37 AM 03/24/2023    4:08 PM  BMP  Glucose 70 - 99 mg/dL 80  97  865   BUN 8 - 23 mg/dL 12  14  10    Creatinine 0.61 - 1.24 mg/dL 7.84  6.96  2.95   Sodium 135 - 145 mmol/L 141  136  135   Potassium 3.5 - 5.1 mmol/L 3.6  3.7  4.1   Chloride 98 - 111 mmol/L 106  105  104   CO2 22 - 32 mmol/L 26  25  21    Calcium 8.9 - 10.3 mg/dL 8.6  8.4  9.0       Latest Ref Rng & Units 04/09/2023   10:50 AM 04/02/2023    8:39 AM 03/26/2023    4:48 AM  CBC  WBC 4.0 - 10.5 K/uL 8.6  6.9  8.5   Hemoglobin 13.0 - 17.0 g/dL 9.3  28.4  9.2   Hematocrit 39.0 - 52.0 % 31.6  35.3  30.8   Platelets 150 - 400 K/uL 629  563  537     Review of Systems  Cardiovascular:  Negative for chest pain, dyspnea on exertion and leg swelling.    Physical Exam:   VS:  BP 120/74 (BP Location: Left Arm, Patient Position: Sitting, Cuff Size: Small)   Pulse 86   Resp 15   Ht 5\' 5"  (1.651 m)   Wt 118 lb (53.5 kg)  SpO2 98%   BMI 19.64 kg/m    Wt Readings from Last 3 Encounters:  05/19/23 118 lb (53.5 kg)  05/15/23 117 lb 12.8 oz (53.4 kg)  05/05/23 117 lb 12.8 oz (53.4 kg)     Physical Exam Neck:     Vascular: No carotid bruit or JVD.  Cardiovascular:     Rate and Rhythm: Normal rate and regular rhythm.     Pulses: Intact distal pulses.          Popliteal pulses are 1+ on the right side and 1+ on the left side.       Dorsalis pedis pulses are 0 on the right side and 0 on the left side.       Posterior tibial pulses are 1+ on the right side and 0 on the left side.     Heart sounds: Normal heart sounds. No murmur heard.    No gallop.  Pulmonary:     Effort: Pulmonary effort is normal.     Breath sounds: Normal breath sounds.  Abdominal:     General: Bowel sounds are normal.     Palpations: Abdomen is soft.  Musculoskeletal:     Right lower leg: No edema.     Left lower leg: No edema.     Studies Reviewed: Marland Kitchen    PV angiogram and left CIA stenting  (STENT VIABAHN 1O10 ) 11/29/2022    Zio Patch Extended out patient EKG monitoring 14 days starting 07/22/2022:   Predominant Rhythm : Normal sinus rhythm.  Min HR: 41 bpm at 2:15 AM. Max HR 139 bpm at 3:30 PM   Atrial arrhythmias: Brief atrial tachycardia episodes for a number.   Atrial fibrillation: There were 6 atrial fibrillation episodes, 3 episodes brief <6 minutes, longest episode 1 hour 50 minutes.  There was no postconversion pause.  Atrial fibrillation burden <1%.   Ventricular arrhythmias: Occasional PVCs and rare ventricular couplets.Marland Kitchen PVC Burden <0.1%   Heart Block: None   Symptoms: No symptoms reported  Remote ILR transmission 04/21/2023: ILR alert for AF and tachy AF event 12sec HR 163, 3 tachy EGM's, 19sec - 12sec, mean HR's 163-178 No hx of PAF  EKG:    EKG Interpretation Date/Time:  Monday May 19 2023 11:24:47 EST Ventricular Rate:  82 PR Interval:  130 QRS Duration:  74 QT Interval:  380 QTC Calculation: 443 R Axis:   -37  Text Interpretation: EKG 05/19/2023: Normal sinus rhythm at rate of 82 bpm.  No evidence of ischemia.  Compared to 02/27/2023, no significant change. Confirmed by Delrae Rend [phone]) on 05/19/2023 11:41:23 AM    Medications and allergies    Not on File   Current Outpatient Medications:    albuterol (VENTOLIN HFA) 108 (90 Base) MCG/ACT inhaler, Inhale 2 puffs into the lungs every 6 (six) hours as needed for wheezing or shortness of breath., Disp: 8 g, Rfl: 2   Cholecalciferol (VITAMIN D3) 50 MCG (2000 UT) TABS, Take 2,000 Units by mouth daily., Disp: , Rfl:    clopidogrel (PLAVIX) 75 MG tablet, Take 1 tablet (75 mg total) by mouth daily., Disp: , Rfl:    cyanocobalamin (VITAMIN B12) 1000 MCG tablet, Take 1 tablet (1,000 mcg total) by mouth daily., Disp: 60 tablet, Rfl: 0   docusate sodium (COLACE) 100 MG capsule, Take 1 capsule (100 mg total) by mouth 2 (two) times daily., Disp: 10 capsule, Rfl: 0   escitalopram  (LEXAPRO) 20 MG tablet, Take 20 mg by mouth daily., Disp: ,  Rfl:    ferrous sulfate 325 (65 FE) MG EC tablet, Take 1 tablet (325 mg total) by mouth 2 (two) times daily., Disp: 60 tablet, Rfl: 3   gabapentin (NEURONTIN) 300 MG capsule, Take 1 capsule (300 mg total) by mouth at bedtime., Disp: 30 capsule, Rfl: 0   guaiFENesin (MUCINEX) 600 MG 12 hr tablet, Take 1 tablet (600 mg total) by mouth 2 (two) times daily., Disp: 60 tablet, Rfl: 2   levothyroxine (SYNTHROID) 88 MCG tablet, Take 88 mcg by mouth daily., Disp: , Rfl:    losartan (COZAAR) 25 MG tablet, Take 1 tablet by mouth in the evening, Disp: 30 tablet, Rfl: 9   methocarbamol (ROBAXIN) 500 MG tablet, Take 500 mg by mouth every 6 (six) hours as needed for muscle spasms., Disp: , Rfl:    Multiple Vitamin (MULTIVITAMIN WITH MINERALS) TABS tablet, Take 1 tablet by mouth daily. Centrum 50+, Disp: , Rfl:    polyethylene glycol (MIRALAX / GLYCOLAX) 17 g packet, Take 17 g by mouth daily., Disp: 14 each, Rfl: 0   rosuvastatin (CRESTOR) 20 MG tablet, Take 1 tablet by mouth once daily, Disp: 90 tablet, Rfl: 0   traMADol (ULTRAM) 50 MG tablet, Take 50 mg by mouth 4 (four) times daily as needed., Disp: , Rfl:    pantoprazole (PROTONIX) 40 MG tablet, Take 1 tablet (40 mg total) by mouth 2 (two) times daily before a meal., Disp: 60 tablet, Rfl: 0   ASSESSMENT AND PLAN: .      ICD-10-CM   1. Paroxysmal atrial fibrillation (HCC)  I48.0 EKG 12-Lead    2. Claudication in peripheral vascular disease (HCC)  I73.9     3. Atherosclerosis of native coronary artery of native heart with other form of angina pectoris (HCC)  I25.118      Assessment and Plan    Atrial Fibrillation  Click Here to Calculate/Change CHADS2VASc Score The patient's CHADS2-VASc score is 3, indicating a 3.2% annual risk of stroke.  Therefore, anticoagulation is not recommended.   CHF History: No HTN History: Yes Diabetes History: No Stroke History: No Vascular Disease History: Yes   Asymptomatic, brief episode of AFib noted on loop recorder. Discussed the risk/benefit of anticoagulation. Given the brief duration of AFib, recent surgery, and ongoing cancer therapy, decision made to monitor with loop recorder and not initiate anticoagulation at this time. -Continue monitoring with loop recorder. -Stop Aspirin due to risk of bleeding ulcers. -Continue Plavix for PAD and also thrombocytosis.  Claudication.   This has improved significantly since angioplasty and stenting of left CIA, he has residual right SFA CTO being managed medically.  CAD: Patient is presently on losartan and also on Crestor high-dose 20 mg daily both for CAD and PAD.  He has elevated coronary calcium score and probably is known coronary disease but remains asymptomatic without angina.  No clinical evidence of heart failure.  Hypertension Blood pressure monitoring at home with a device that is recording higher readings than in-office measurements. -Return current blood pressure monitor and purchase an Omron device for more accurate readings at home.  Esophageal Stricture Difficulty swallowing reported. Recent endoscopy showed a non-bleeding duodenal ulcer and esophageal stricture which was dilated. -Continue current management. -Plan for repeat endoscopy in March.  Follow-up in 6 months.    Signed,  [name], MD, [facility] 05/19/2023, 1:42 PM [facility] [address] [place], [place] [address] Phone: [phone]. Fax:  [phone]

## 2023-05-20 ENCOUNTER — Ambulatory Visit: Payer: Medicare HMO

## 2023-05-20 ENCOUNTER — Other Ambulatory Visit: Payer: Self-pay

## 2023-05-20 ENCOUNTER — Ambulatory Visit
Admission: RE | Admit: 2023-05-20 | Discharge: 2023-05-20 | Disposition: A | Discharge status: Home / Self Care | Payer: Medicare HMO | Source: Ambulatory Visit | Attending: Radiation Oncology

## 2023-05-20 DIAGNOSIS — C37 Malignant neoplasm of thymus: Secondary | ICD-10-CM | POA: Diagnosis not present

## 2023-05-20 DIAGNOSIS — Z510 Encounter for antineoplastic radiation therapy: Secondary | ICD-10-CM | POA: Diagnosis not present

## 2023-05-20 LAB — RAD ONC ARIA SESSION SUMMARY
Course Elapsed Days: 26
Plan Fractions Treated to Date: 17
Plan Prescribed Dose Per Fraction: 1.8 Gy
Plan Total Fractions Prescribed: 28
Plan Total Prescribed Dose: 50.4 Gy
Reference Point Dosage Given to Date: 30.6 Gy
Reference Point Session Dosage Given: 1.8 Gy
Session Number: 17

## 2023-05-21 ENCOUNTER — Ambulatory Visit: Payer: Medicare HMO

## 2023-05-21 ENCOUNTER — Telehealth: Payer: Self-pay | Admitting: Radiation Oncology

## 2023-05-21 NOTE — Telephone Encounter (Signed)
Pt's family called to advise they would need to miss today's appt due to [employer]. Attempted to call Support RTT, no answer. Best c/b number verified with pt's family; email sent to Support RTT.

## 2023-05-22 ENCOUNTER — Other Ambulatory Visit: Payer: Self-pay

## 2023-05-22 ENCOUNTER — Ambulatory Visit
Admission: RE | Admit: 2023-05-22 | Discharge: 2023-05-22 | Disposition: A | Discharge status: Home / Self Care | Payer: Medicare HMO | Source: Ambulatory Visit | Attending: Radiation Oncology | Admitting: Radiation Oncology

## 2023-05-22 DIAGNOSIS — Z510 Encounter for antineoplastic radiation therapy: Secondary | ICD-10-CM | POA: Diagnosis not present

## 2023-05-22 DIAGNOSIS — C37 Malignant neoplasm of thymus: Secondary | ICD-10-CM | POA: Diagnosis not present

## 2023-05-22 LAB — RAD ONC ARIA SESSION SUMMARY
Course Elapsed Days: 28
Plan Fractions Treated to Date: 18
Plan Prescribed Dose Per Fraction: 1.8 Gy
Plan Total Fractions Prescribed: 28
Plan Total Prescribed Dose: 50.4 Gy
Reference Point Dosage Given to Date: 32.4 Gy
Reference Point Session Dosage Given: 1.8 Gy
Session Number: 18

## 2023-05-23 ENCOUNTER — Other Ambulatory Visit: Payer: Self-pay

## 2023-05-23 ENCOUNTER — Ambulatory Visit
Admission: RE | Admit: 2023-05-23 | Discharge: 2023-05-23 | Disposition: A | Discharge status: Home / Self Care | Payer: Medicare HMO | Source: Ambulatory Visit | Attending: Radiation Oncology

## 2023-05-23 DIAGNOSIS — C37 Malignant neoplasm of thymus: Secondary | ICD-10-CM | POA: Diagnosis not present

## 2023-05-23 DIAGNOSIS — Z510 Encounter for antineoplastic radiation therapy: Secondary | ICD-10-CM | POA: Diagnosis not present

## 2023-05-23 LAB — RAD ONC ARIA SESSION SUMMARY
Course Elapsed Days: 29
Plan Fractions Treated to Date: 19
Plan Prescribed Dose Per Fraction: 1.8 Gy
Plan Total Fractions Prescribed: 28
Plan Total Prescribed Dose: 50.4 Gy
Reference Point Dosage Given to Date: 34.2 Gy
Reference Point Session Dosage Given: 1.8 Gy
Session Number: 19

## 2023-05-26 ENCOUNTER — Ambulatory Visit: Payer: Medicare HMO

## 2023-05-26 ENCOUNTER — Other Ambulatory Visit: Payer: Self-pay

## 2023-05-26 ENCOUNTER — Ambulatory Visit
Admission: RE | Admit: 2023-05-26 | Discharge: 2023-05-26 | Disposition: A | Discharge status: Home / Self Care | Payer: Medicare HMO | Source: Ambulatory Visit | Attending: Radiation Oncology

## 2023-05-26 DIAGNOSIS — Z510 Encounter for antineoplastic radiation therapy: Secondary | ICD-10-CM | POA: Diagnosis not present

## 2023-05-26 DIAGNOSIS — C37 Malignant neoplasm of thymus: Secondary | ICD-10-CM | POA: Diagnosis not present

## 2023-05-26 LAB — RAD ONC ARIA SESSION SUMMARY
Course Elapsed Days: 32
Plan Fractions Treated to Date: 20
Plan Prescribed Dose Per Fraction: 1.8 Gy
Plan Total Fractions Prescribed: 28
Plan Total Prescribed Dose: 50.4 Gy
Reference Point Dosage Given to Date: 36 Gy
Reference Point Session Dosage Given: 1.8 Gy
Session Number: 20

## 2023-05-27 ENCOUNTER — Inpatient Hospital Stay: Payer: Medicare HMO | Admitting: Nutrition

## 2023-05-27 ENCOUNTER — Other Ambulatory Visit: Payer: Self-pay

## 2023-05-27 ENCOUNTER — Ambulatory Visit
Admission: RE | Admit: 2023-05-27 | Discharge: 2023-05-27 | Disposition: A | Discharge status: Home / Self Care | Payer: Medicare HMO | Source: Ambulatory Visit | Attending: Radiation Oncology

## 2023-05-27 ENCOUNTER — Ambulatory Visit: Payer: Medicare HMO

## 2023-05-27 DIAGNOSIS — C37 Malignant neoplasm of thymus: Secondary | ICD-10-CM | POA: Diagnosis not present

## 2023-05-27 DIAGNOSIS — Z510 Encounter for antineoplastic radiation therapy: Secondary | ICD-10-CM | POA: Diagnosis not present

## 2023-05-27 LAB — RAD ONC ARIA SESSION SUMMARY
Course Elapsed Days: 33
Plan Fractions Treated to Date: 21
Plan Prescribed Dose Per Fraction: 1.8 Gy
Plan Total Fractions Prescribed: 28
Plan Total Prescribed Dose: 50.4 Gy
Reference Point Dosage Given to Date: 37.8 Gy
Reference Point Session Dosage Given: 1.8 Gy
Session Number: 21

## 2023-05-28 ENCOUNTER — Ambulatory Visit
Admission: RE | Admit: 2023-05-28 | Discharge: 2023-05-28 | Disposition: A | Discharge status: Home / Self Care | Payer: Medicare HMO | Source: Ambulatory Visit | Attending: Radiation Oncology

## 2023-05-28 ENCOUNTER — Other Ambulatory Visit: Payer: Self-pay

## 2023-05-28 DIAGNOSIS — Z510 Encounter for antineoplastic radiation therapy: Secondary | ICD-10-CM | POA: Diagnosis not present

## 2023-05-28 DIAGNOSIS — C37 Malignant neoplasm of thymus: Secondary | ICD-10-CM | POA: Diagnosis not present

## 2023-05-28 LAB — RAD ONC ARIA SESSION SUMMARY
Course Elapsed Days: 34
Plan Fractions Treated to Date: 22
Plan Prescribed Dose Per Fraction: 1.8 Gy
Plan Total Fractions Prescribed: 28
Plan Total Prescribed Dose: 50.4 Gy
Reference Point Dosage Given to Date: 39.6 Gy
Reference Point Session Dosage Given: 1.8 Gy
Session Number: 22

## 2023-05-28 NOTE — Progress Notes (Signed)
Nutrition follow up completed with patient and wife after radiation therapy for Thymic cancer. He is followed by Dr. Bertis Ruddy and Dr. Basilio Cairo.  Patient is scheduled to complete radiation treatments on Feb 6.  No recent labs.  Weight stable at 117.6 pounds Jan 28 from 117.2# Jan 16.  Reports slight nausea. He has some balance issues. He is sleeping a lot at home and admits this causes him to miss a meal or snack. He ate a pimento cheese sandwich and cheese doodles with coffee yesterday. He still has not tried any nutrition supplements but says he likes CIB and used to drink it. He did not try the Ensure samples I provided at last visit. He agrees to try it today in the office. Reports it was ok and he will drink them.  Nutrition Diagnosis: Food and Nutrition related knowledge deficit ongoing  Intervention: Provided samples and coupons for nutrition shakes. Educated he would need to drink 5 cartons daily to meet calorie needs if he did not eat food. Encouraged increased activity per MD.   Monitoring, Evaluation, Goals: Tolerate increased calories and protein to minimize weight loss.  Next Visit: To be scheduled. Encouraged patient to check in after radiation if next week as needed.

## 2023-05-29 ENCOUNTER — Ambulatory Visit
Admission: RE | Admit: 2023-05-29 | Discharge: 2023-05-29 | Disposition: A | Discharge status: Home / Self Care | Payer: Medicare HMO | Source: Ambulatory Visit | Attending: Radiation Oncology | Admitting: Radiation Oncology

## 2023-05-29 ENCOUNTER — Other Ambulatory Visit: Payer: Self-pay

## 2023-05-29 ENCOUNTER — Ambulatory Visit: Payer: Medicare HMO

## 2023-05-29 DIAGNOSIS — Z510 Encounter for antineoplastic radiation therapy: Secondary | ICD-10-CM | POA: Diagnosis not present

## 2023-05-29 DIAGNOSIS — C37 Malignant neoplasm of thymus: Secondary | ICD-10-CM | POA: Diagnosis not present

## 2023-05-29 LAB — RAD ONC ARIA SESSION SUMMARY
Course Elapsed Days: 35
Plan Fractions Treated to Date: 23
Plan Prescribed Dose Per Fraction: 1.8 Gy
Plan Total Fractions Prescribed: 28
Plan Total Prescribed Dose: 50.4 Gy
Reference Point Dosage Given to Date: 41.4 Gy
Reference Point Session Dosage Given: 1.8 Gy
Session Number: 23

## 2023-05-30 ENCOUNTER — Ambulatory Visit: Payer: Medicare HMO

## 2023-05-30 ENCOUNTER — Other Ambulatory Visit: Payer: Self-pay

## 2023-05-30 ENCOUNTER — Ambulatory Visit
Admission: RE | Admit: 2023-05-30 | Discharge: 2023-05-30 | Disposition: A | Discharge status: Home / Self Care | Payer: Medicare HMO | Source: Ambulatory Visit | Attending: Radiation Oncology

## 2023-05-30 DIAGNOSIS — R55 Syncope and collapse: Secondary | ICD-10-CM

## 2023-05-30 DIAGNOSIS — C37 Malignant neoplasm of thymus: Secondary | ICD-10-CM | POA: Diagnosis not present

## 2023-05-30 DIAGNOSIS — Z510 Encounter for antineoplastic radiation therapy: Secondary | ICD-10-CM | POA: Diagnosis not present

## 2023-05-30 LAB — CUP PACEART REMOTE DEVICE CHECK
Date Time Interrogation Session: 20250129080055
Pulse Gen Serial Number: 511029626

## 2023-05-30 LAB — RAD ONC ARIA SESSION SUMMARY
Course Elapsed Days: 36
Plan Fractions Treated to Date: 24
Plan Prescribed Dose Per Fraction: 1.8 Gy
Plan Total Fractions Prescribed: 28
Plan Total Prescribed Dose: 50.4 Gy
Reference Point Dosage Given to Date: 43.2 Gy
Reference Point Session Dosage Given: 1.8 Gy
Session Number: 24

## 2023-05-30 NOTE — Addendum Note (Signed)
Addended by: Elease Etienne A on: 05/30/2023 09:55 AM   Modules accepted: Orders

## 2023-05-30 NOTE — Progress Notes (Signed)
 [facility] Summary Report / Loop Recorder

## 2023-06-02 ENCOUNTER — Other Ambulatory Visit: Payer: Self-pay

## 2023-06-02 ENCOUNTER — Ambulatory Visit: Payer: Medicare HMO

## 2023-06-02 ENCOUNTER — Ambulatory Visit
Admission: RE | Admit: 2023-06-02 | Discharge: 2023-06-02 | Disposition: A | Discharge status: Home / Self Care | Payer: Medicare HMO | Source: Ambulatory Visit | Attending: Radiation Oncology | Admitting: Radiation Oncology

## 2023-06-02 DIAGNOSIS — C01 Malignant neoplasm of base of tongue: Secondary | ICD-10-CM | POA: Diagnosis not present

## 2023-06-02 DIAGNOSIS — Z79899 Other long term (current) drug therapy: Secondary | ICD-10-CM | POA: Diagnosis not present

## 2023-06-02 DIAGNOSIS — E440 Moderate protein-calorie malnutrition: Secondary | ICD-10-CM | POA: Insufficient documentation

## 2023-06-02 DIAGNOSIS — Z87891 Personal history of nicotine dependence: Secondary | ICD-10-CM | POA: Diagnosis not present

## 2023-06-02 DIAGNOSIS — C37 Malignant neoplasm of thymus: Secondary | ICD-10-CM | POA: Insufficient documentation

## 2023-06-02 DIAGNOSIS — Z510 Encounter for antineoplastic radiation therapy: Secondary | ICD-10-CM | POA: Insufficient documentation

## 2023-06-02 LAB — RAD ONC ARIA SESSION SUMMARY
Course Elapsed Days: 39
Plan Fractions Treated to Date: 25
Plan Prescribed Dose Per Fraction: 1.8 Gy
Plan Total Fractions Prescribed: 28
Plan Total Prescribed Dose: 50.4 Gy
Reference Point Dosage Given to Date: 45 Gy
Reference Point Session Dosage Given: 1.8 Gy
Session Number: 25

## 2023-06-03 ENCOUNTER — Telehealth: Payer: Self-pay

## 2023-06-03 ENCOUNTER — Ambulatory Visit
Admission: RE | Admit: 2023-06-03 | Discharge: 2023-06-03 | Disposition: A | Discharge status: Home / Self Care | Payer: Medicare HMO | Source: Ambulatory Visit | Attending: Radiation Oncology | Admitting: Radiation Oncology

## 2023-06-03 ENCOUNTER — Encounter: Payer: Self-pay | Admitting: Hematology and Oncology

## 2023-06-03 ENCOUNTER — Other Ambulatory Visit: Payer: Self-pay

## 2023-06-03 ENCOUNTER — Ambulatory Visit: Payer: Medicare HMO

## 2023-06-03 ENCOUNTER — Inpatient Hospital Stay (HOSPITAL_BASED_OUTPATIENT_CLINIC_OR_DEPARTMENT_OTHER): Payer: Medicare HMO | Admitting: Hematology and Oncology

## 2023-06-03 VITALS — BP 132/78 | HR 82 | Temp 97.8°F | Resp 18 | Ht 65.0 in | Wt 118.0 lb

## 2023-06-03 DIAGNOSIS — Z79899 Other long term (current) drug therapy: Secondary | ICD-10-CM | POA: Insufficient documentation

## 2023-06-03 DIAGNOSIS — C01 Malignant neoplasm of base of tongue: Secondary | ICD-10-CM | POA: Diagnosis not present

## 2023-06-03 DIAGNOSIS — C37 Malignant neoplasm of thymus: Secondary | ICD-10-CM | POA: Insufficient documentation

## 2023-06-03 DIAGNOSIS — R1310 Dysphagia, unspecified: Secondary | ICD-10-CM | POA: Diagnosis not present

## 2023-06-03 DIAGNOSIS — E440 Moderate protein-calorie malnutrition: Secondary | ICD-10-CM | POA: Insufficient documentation

## 2023-06-03 DIAGNOSIS — Z510 Encounter for antineoplastic radiation therapy: Secondary | ICD-10-CM | POA: Diagnosis not present

## 2023-06-03 DIAGNOSIS — Z87891 Personal history of nicotine dependence: Secondary | ICD-10-CM | POA: Diagnosis not present

## 2023-06-03 LAB — RAD ONC ARIA SESSION SUMMARY
Course Elapsed Days: 40
Plan Fractions Treated to Date: 26
Plan Prescribed Dose Per Fraction: 1.8 Gy
Plan Total Fractions Prescribed: 28
Plan Total Prescribed Dose: 50.4 Gy
Reference Point Dosage Given to Date: 46.8 Gy
Reference Point Session Dosage Given: 1.8 Gy
Session Number: 26

## 2023-06-03 NOTE — Progress Notes (Signed)
 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 this week I plan to order CT imaging in 2 months to assess response to therapy  Protein-calorie malnutrition, moderate (HCC) He has significant difficulties gaining weight He had dysphagia CT imaging from last year show abnormalities in his abdomen including a pancreatic lesion I recommend CT imaging of the abdomen and pelvis for further assessment  Orders Placed This Encounter  Procedures   CT CHEST ABDOMEN PELVIS W CONTRAST    Standing Status:   Future    Expected Date:   08/11/2023    Expiration Date:   06/02/2024    If indicated for the ordered procedure, I authorize the administration of contrast media per Radiology protocol:   Yes    Does the patient have a contrast media/X-ray dye allergy?:   No    Preferred imaging location?:   [facility]    If indicated for the ordered procedure, I authorize the administration of oral contrast media per Radiology protocol:   Yes   Ferritin    Standing Status:   Future    Expected Date:   08/11/2023    Expiration Date:   06/02/2024   Iron and Iron Binding Capacity ([facility] only)    Standing Status:   Future    Expected Date:   08/11/2023    Expiration Date:   06/02/2024   CMP (Cancer Center only)    Standing Status:   Future    Expected Date:   08/11/2023    Expiration Date:   06/02/2024   TSH    Standing Status:   Future    Expected Date:   08/11/2023    Expiration Date:   06/02/2024    All questions were answered. The patient knows to call the clinic with any problems, questions or concerns. The total time spent in the appointment was 30 minutes encounter with patients including review of chart and various tests results, discussions about plan of care and coordination of care plan   Angel Bedford,  MD 06/03/2023 3:30 PM  INTERVAL HISTORY: Please see below for problem oriented charting. he returns for surveillance follow-up He will be finishing radiation treatment soon He have difficulties gaining weight He has dysphagia and early satiety He denies recent changes in bowel habits He is scheduled for EGD and evaluation by GI next month I reviewed his CT imaging from November which showed dilated loops of bowel and pancreatic lesion  REVIEW OF SYSTEMS:   Constitutional: Denies fevers, chills or abnormal weight loss Eyes: Denies blurriness of vision Ears, nose, mouth, throat, and face: Denies mucositis or sore throat Respiratory: Denies cough, dyspnea or wheezes Cardiovascular: Denies palpitation, chest discomfort or lower extremity swelling Gastrointestinal:  Denies nausea, heartburn or change in bowel habits Skin: Denies abnormal skin rashes Lymphatics: Denies new lymphadenopathy or easy bruising Neurological:Denies numbness, tingling or new weaknesses Behavioral/Psych: Mood is stable, no new changes  All other systems were reviewed with the patient and are negative.  I have reviewed the past medical history, past surgical history, social history and family history with the patient and they are unchanged from previous note.  ALLERGIES:  has no allergies on file.  MEDICATIONS:  Current Outpatient Medications  Medication Sig Dispense Refill   albuterol  (VENTOLIN  HFA) 108 (90 Base) MCG/ACT inhaler Inhale 2 puffs  into the lungs every 6 (six) hours as needed for wheezing or shortness of breath. 8 g 2   Cholecalciferol  (VITAMIN D3) 50 MCG (2000 UT) TABS Take 2,000 Units by mouth daily.     clopidogrel  (PLAVIX ) 75 MG tablet Take 1 tablet (75 mg total) by mouth daily.     cyanocobalamin  (VITAMIN B12) 1000 MCG tablet Take 1 tablet (1,000 mcg total) by mouth daily. 60 tablet 0   docusate sodium  (COLACE) 100 MG capsule Take 1 capsule (100 mg total) by mouth 2 (two) times daily. 10 capsule  0   escitalopram  (LEXAPRO ) 20 MG tablet Take 20 mg by mouth daily.     ferrous sulfate  325 (65 FE) MG EC tablet Take 1 tablet (325 mg total) by mouth 2 (two) times daily. 60 tablet 3   gabapentin  (NEURONTIN ) 300 MG capsule Take 1 capsule (300 mg total) by mouth at bedtime. 30 capsule 0   guaiFENesin  (MUCINEX ) 600 MG 12 hr tablet Take 1 tablet (600 mg total) by mouth 2 (two) times daily. 60 tablet 2   levothyroxine  (SYNTHROID ) 88 MCG tablet Take 88 mcg by mouth daily.     losartan  (COZAAR ) 25 MG tablet Take 1 tablet by mouth in the evening 30 tablet 9   methocarbamol  (ROBAXIN ) 500 MG tablet Take 500 mg by mouth every 6 (six) hours as needed for muscle spasms.     Multiple Vitamin (MULTIVITAMIN WITH MINERALS) TABS tablet Take 1 tablet by mouth daily. Centrum 50+     pantoprazole  (PROTONIX ) 40 MG tablet Take 1 tablet (40 mg total) by mouth 2 (two) times daily before a meal. 60 tablet 0   polyethylene glycol (MIRALAX  / GLYCOLAX ) 17 g packet Take 17 g by mouth daily. 14 each 0   rosuvastatin  (CRESTOR ) 20 MG tablet Take 1 tablet by mouth once daily 90 tablet 0   traMADol  (ULTRAM ) 50 MG tablet Take 50 mg by mouth 4 (four) times daily as needed.     No current facility-administered medications for this visit.    SUMMARY OF ONCOLOGIC HISTORY: Oncology History Overview Note  Cancer of base of tongue, HPV negative   Primary site: Pharynx - Oropharynx   Staging method: AJCC 7th Edition   Clinical: Stage IVB (T2, N3, M0) signed by Angel Bedford, MD on 10/15/2013 10:25 AM   Summary: Stage IVB (T2, N3, M0)     Cancer of base of tongue (HCC)  09/17/2013 Imaging   CT scan of the neck showed large bilateral neck lymphadenopathy with associated tongue mass.   09/23/2013 Procedure   Fine needle aspirate of the left neck mass came back positive for squamous cell carcinoma.   10/04/2013 Surgery   Laryngoscopy showed a firm mass within the left tongue base extending past the midline and encompassing much of the  tongue base.  There is extension of the mass into the vallecula and on to the lingual surface of the epiglottis.   10/04/2013 Pathology Results   Vallecula and tongue biopsy confirmed squamous cell carcinoma.   10/15/2013 Imaging   PET/CT scan showed no evidence of distant metastatic disease apart from just regional lymph node involvement.   10/25/2013 - 11/01/2013 Hospital Admission   He was admitted to the hospital due to malignant hypercalcemia and treatment was initiated.   10/27/2013 - 12/13/2013 Chemotherapy   He was given 3 cycles of induction chemo with cisplatin , taxotere , 5 FU   11/03/2013 - 11/07/2013 Hospital Admission   He was hospitalized for dehydration   12/31/2013  Procedure   PICC line was removed   01/10/2014 Imaging   PET Ct scan showed near complete response to treatment   01/26/2014 - 03/18/2014 Radiation Therapy   Treated with helical IMRT tomotherapy:  Base of tongue and bilateral neck / 70 Gy in 35 fractions to gross disease, 63 Gy in 35 fractions to high risk nodal echelons, and 56 Gy in 35 fractions to intermediate risk nodal echelons.   07/14/2014 Imaging   CT scan show near complete response to treatment but new lung nodule   09/22/2014 Imaging   CT Neck - No new adenopathy or nodes with increasing size.  No evidence for residual or recurrent primary tumor.    09/22/2014 Imaging   CT Chest - Resolution of left upper lobe nodule, likely infectious or inflammatory.  No acute process or evidence of metastatic disease in chest.    12/23/2014 Imaging   CT Neck - Extensive radiation changes in the neck are stable.  6 mm calcified left level 3 node is stable. No new adenopathy or mass.    Thymic carcinoma (HCC)  07/28/2022 Imaging   1. New 4.8 cm x 4.0 cm low-attenuation anterior mediastinal mass with an ill-defined component of mildly increased attenuation. This could reflect a complex thymic cyst, although a cystic neoplasm cannot be excluded. MRI correlation is  recommended. 2. No significant extracardiac findings within the visualized chest   01/23/2023 Imaging   1. Complex 5.4 x 3.6 x 5.7 cm cystic left anterior mediastinal mass with enhancing solid 2.4 x 2.0 cm mural nodule superiorly, minimally increased in size since 07/26/2022 CT and new from 08/18/2015 CT. Cystic thymic malignancy favored. Thoracic surgical consultation advised. 2. No findings of metastatic disease in the chest. 3. One vessel coronary atherosclerosis. 4. Moderate centrilobular emphysema with diffuse bronchial wall thickening and saber sheath trachea, suggesting COPD. 5.  Emphysema (ICD10-J43.9).   03/03/2023 Pathology Results   SURGICAL PATHOLOGY CASE: [id number] PATIENT: Angel [name] Surgical Pathology Report  Clinical History: anterior mediastinal mass (cm)  FINAL MICROSCOPIC DIAGNOSIS:  A. 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 Dr. LeGolvan reviewed the case and concurs with the above diagnosis.  ONCOLOGY TABLE:  THYMUS: Resection  Procedure: Thymectomy Tumor Site: Thymus Histologic Type: Thymic squamous cell carcinoma Tumor Size: 7.3 cm Lymphovascular Invasion: Not identified Treatment Effect: No known presurgical therapy Site(s) Involved by Direct Tumor Invasion: Not applicable Margins: All margins negative for tumor Regional Lymph Nodes:      Number of Lymph Nodes with Tumor: 1      Number of Lymph Nodes Examined: 2 Distant Metastasis:      Distant Site(s) Involved: Not applicable Pathologic Stage Classification (pTNM, AJCC 8th Edition): pT1a, pN1 Ancillary Studies: Can be performed up request Representative Tumor Block:  A1  (v4.1.0.1)     03/03/2023 Surgery   Operative Report    DATE OF PROCEDURE: 03/03/2023   PREOPERATIVE DIAGNOSIS:  Anterior mediastinal mass, suspect thymoma.   POSTOPERATIVE DIAGNOSIS:  Anterior mediastinal mass, suspect thymoma.   PROCEDURE:  Xi robotic-assisted left VATS,  Thymectomy.   SURGEON:  Elspeth BROCKS. Kerrin, MD   ASSISTANT:  Kyla Donald, PA.   ANESTHESIA:  General.   FINDINGS:  Large  mass directly adjacent, but not invading the phrenic nerve.  Adhesions to left upper lobe necessitating en bloc wedge resection.   03/24/2023 Imaging   1. Generalized mildly prominent fluid-filled small bowel loops without discrete small bowel caliber transition. Fluid levels throughout the right, transverse and descending colon. No bowel wall thickening. Findings are nonspecific but most suggestive of mild adynamic ileus such as due to enterocolitis. 2. Minimal sigmoid diverticulosis. 3. Chronic diffuse pancreatic duct dilation up to 0.5 cm diameter, mildly increased from 0.3 cm on 08/25/2015 CT. No discrete pancreatic mass. Suggest nonemergent outpatient MRI abdomen with MRCP without and with IV contrast for further evaluation. 4. Trace layering left pleural effusion. Mild elevation of the left hemidiaphragm with platelike mild-to-moderate left lung base atelectasis. 5.  Aortic Atherosclerosis (ICD10-I70.0).   03/25/2023 Initial Diagnosis   Thymic carcinoma (HCC)   03/25/2023 Cancer Staging   Staging form: Thymus, AJCC 8th Edition - Pathologic stage from 03/25/2023: Stage IVA (pT1a, pN1, cM0) - Signed by Lonn Hicks, MD on 03/25/2023 Stage prefix: Initial diagnosis     PHYSICAL EXAMINATION: ECOG PERFORMANCE STATUS: 1 - Symptomatic but completely ambulatory  Vitals:   06/03/23 1436  BP: 132/78  Pulse: 82  Resp: 18  Temp: 97.8 F (36.6 C)  SpO2: 97%   Filed Weights   06/03/23 1436  Weight: 118 lb (53.5 kg)    GENERAL:alert, no distress and  comfortable  LABORATORY DATA:  I have reviewed the data as listed    Component Value Date/Time   NA 141 03/26/2023 0448   NA 143 10/28/2022 0846   NA 140 02/14/2017 0851   K 3.6 03/26/2023 0448   K 5.1 02/14/2017 0851   CL 106 03/26/2023 0448   CO2 26 03/26/2023 0448   CO2 27 02/14/2017 0851   GLUCOSE 80 03/26/2023 0448   GLUCOSE 117 02/14/2017 0851   BUN 12 03/26/2023 0448   BUN 4 (L) 10/28/2022 0846   BUN 7.9 02/14/2017 0851   CREATININE 0.88 03/26/2023 0448   CREATININE 1.09 02/12/2018 0943   CREATININE 0.9 02/14/2017 0851   CALCIUM  8.6 (L) 03/26/2023 0448   CALCIUM  9.7 02/14/2017 0851   PROT 7.3 03/24/2023 1608   PROT 7.4 02/14/2017 0851   ALBUMIN  3.9 03/24/2023 1608   ALBUMIN  4.3 02/14/2017 0851   AST 21 03/24/2023 1608   AST 20 02/12/2018 0943   AST 18 02/14/2017 0851   ALT 14 03/24/2023 1608   ALT 12 02/12/2018 0943   ALT 9 02/14/2017 0851   ALKPHOS 59 03/24/2023 1608   ALKPHOS 59 02/14/2017 0851   BILITOT 0.6 03/24/2023 1608   BILITOT 0.5 02/12/2018 0943   BILITOT 0.33 02/14/2017 0851   GFRNONAA >60 03/26/2023 0448   GFRNONAA >60 02/12/2018 0943   GFRAA >60 08/12/2019 2107   GFRAA >60 02/12/2018 0943    No results found for: SPEP, UPEP  Lab Results  Component Value Date   WBC 8.6 04/09/2023   NEUTROABS 6.0 04/09/2023   HGB 9.3 (L) 04/09/2023   HCT 31.6 (L) 04/09/2023   MCV 79.2 (L) 04/09/2023   PLT 629 (H) 04/09/2023      Chemistry      Component Value Date/Time   NA 141 03/26/2023 0448   NA 143 10/28/2022 0846   NA 140 02/14/2017 0851   K 3.6 03/26/2023 0448   K 5.1 02/14/2017 0851   CL 106 03/26/2023 0448   CO2 26 03/26/2023 0448   CO2 27 02/14/2017 0851   BUN 12 03/26/2023 0448  BUN 4 (L) 10/28/2022 0846   BUN 7.9 02/14/2017 0851   CREATININE 0.88 03/26/2023 0448   CREATININE 1.09 02/12/2018 0943   CREATININE 0.9 02/14/2017 0851      Component Value Date/Time   CALCIUM  8.6 (L) 03/26/2023 0448   CALCIUM  9.7 02/14/2017 0851    ALKPHOS 59 03/24/2023 1608   ALKPHOS 59 02/14/2017 0851   AST 21 03/24/2023 1608   AST 20 02/12/2018 0943   AST 18 02/14/2017 0851   ALT 14 03/24/2023 1608   ALT 12 02/12/2018 0943   ALT 9 02/14/2017 0851   BILITOT 0.6 03/24/2023 1608   BILITOT 0.5 02/12/2018 0943   BILITOT 0.33 02/14/2017 9148

## 2023-06-03 NOTE — Telephone Encounter (Signed)
Called and given appt to wife for CT on 4/14, arrive at 1015 for lab then arrive to radiology at 1030 to drink oral contrast. CT at [facility] at 1230. Printed out a schedule and wife will pick up tomorrow after radiation appt.

## 2023-06-03 NOTE — Assessment & Plan Note (Signed)
He has significant difficulties gaining weight He had dysphagia CT imaging from last year show abnormalities in his abdomen including a pancreatic lesion I recommend CT imaging of the abdomen and pelvis for further assessment

## 2023-06-03 NOTE — Assessment & Plan Note (Signed)
The patient will complete radiation therapy this week I plan to order CT imaging in 2 months to assess response to therapy

## 2023-06-04 ENCOUNTER — Ambulatory Visit: Payer: Medicare HMO

## 2023-06-04 ENCOUNTER — Other Ambulatory Visit: Payer: Self-pay

## 2023-06-04 ENCOUNTER — Ambulatory Visit
Admission: RE | Admit: 2023-06-04 | Discharge: 2023-06-04 | Disposition: A | Discharge status: Home / Self Care | Payer: Medicare HMO | Source: Ambulatory Visit | Attending: Radiation Oncology | Admitting: Radiation Oncology

## 2023-06-04 DIAGNOSIS — Z87891 Personal history of nicotine dependence: Secondary | ICD-10-CM | POA: Diagnosis not present

## 2023-06-04 DIAGNOSIS — E440 Moderate protein-calorie malnutrition: Secondary | ICD-10-CM | POA: Diagnosis not present

## 2023-06-04 DIAGNOSIS — C37 Malignant neoplasm of thymus: Secondary | ICD-10-CM | POA: Diagnosis not present

## 2023-06-04 DIAGNOSIS — Z510 Encounter for antineoplastic radiation therapy: Secondary | ICD-10-CM | POA: Diagnosis not present

## 2023-06-04 DIAGNOSIS — Z79899 Other long term (current) drug therapy: Secondary | ICD-10-CM | POA: Diagnosis not present

## 2023-06-04 DIAGNOSIS — C01 Malignant neoplasm of base of tongue: Secondary | ICD-10-CM | POA: Diagnosis not present

## 2023-06-04 LAB — RAD ONC ARIA SESSION SUMMARY
Course Elapsed Days: 41
Plan Fractions Treated to Date: 27
Plan Prescribed Dose Per Fraction: 1.8 Gy
Plan Total Fractions Prescribed: 28
Plan Total Prescribed Dose: 50.4 Gy
Reference Point Dosage Given to Date: 48.6 Gy
Reference Point Session Dosage Given: 1.8 Gy
Session Number: 27

## 2023-06-05 ENCOUNTER — Ambulatory Visit
Admission: RE | Admit: 2023-06-05 | Discharge: 2023-06-05 | Disposition: A | Discharge status: Home / Self Care | Payer: Medicare HMO | Source: Ambulatory Visit | Attending: Radiation Oncology | Admitting: Radiation Oncology

## 2023-06-05 ENCOUNTER — Other Ambulatory Visit: Payer: Self-pay

## 2023-06-05 DIAGNOSIS — C01 Malignant neoplasm of base of tongue: Secondary | ICD-10-CM | POA: Diagnosis not present

## 2023-06-05 DIAGNOSIS — Z87891 Personal history of nicotine dependence: Secondary | ICD-10-CM | POA: Diagnosis not present

## 2023-06-05 DIAGNOSIS — E440 Moderate protein-calorie malnutrition: Secondary | ICD-10-CM | POA: Diagnosis not present

## 2023-06-05 DIAGNOSIS — Z510 Encounter for antineoplastic radiation therapy: Secondary | ICD-10-CM | POA: Diagnosis not present

## 2023-06-05 DIAGNOSIS — C37 Malignant neoplasm of thymus: Secondary | ICD-10-CM | POA: Diagnosis not present

## 2023-06-05 DIAGNOSIS — Z79899 Other long term (current) drug therapy: Secondary | ICD-10-CM | POA: Diagnosis not present

## 2023-06-05 LAB — RAD ONC ARIA SESSION SUMMARY
Course Elapsed Days: 42
Plan Fractions Treated to Date: 28
Plan Prescribed Dose Per Fraction: 1.8 Gy
Plan Total Fractions Prescribed: 28
Plan Total Prescribed Dose: 50.4 Gy
Reference Point Dosage Given to Date: 50.4 Gy
Reference Point Session Dosage Given: 1.8 Gy
Session Number: 28

## 2023-06-06 NOTE — Radiation Completion Notes (Signed)
 Patient Name: Angel [name], Angel [name] MRN: [id number] Date of Birth: 01/27/1951 Referring Physician: IZETTA CORK, M.D. Date of Service: 2023-06-06 Radiation Oncologist: Lauraine Golden, M.D. [facility] [place]                             RADIATION ONCOLOGY END OF TREATMENT NOTE     Diagnosis: C37 Malignant neoplasm of thymus Staging on 2013-10-05: Cancer of base of tongue (HCC) T=T2, N=N3, M=M0 Staging on 2023-03-25: Thymic carcinoma (HCC) T=pT1a, N=pN1, M=cM0 Intent: Curative     ==========DELIVERED PLANS==========  First Treatment Date: 2023-04-24 Last Treatment Date: 2023-06-05   Plan Name: Chest_Thymic Site: Thymus Technique: IMRT Mode: Photon Dose Per Fraction: 1.8 Gy Prescribed Dose (Delivered / Prescribed): 50.4 Gy / 50.4 Gy Prescribed Fxs (Delivered / Prescribed): 28 / 28     ==========ON TREATMENT VISIT DATES========== 2023-04-25, 2023-05-05, 2023-05-12, 2023-05-20, 2023-05-27, 2023-06-02, 2023-06-04     ==========UPCOMING VISITS==========       ==========APPENDIX - ON TREATMENT VISIT NOTES==========   See weekly On Treatment Notes in Epic for details in the Media tab (listed as Progress notes on the On Treatment Visit Dates listed above).

## 2023-06-09 ENCOUNTER — Ambulatory Visit (HOSPITAL_COMMUNITY)
Admission: RE | Admit: 2023-06-09 | Payer: Medicare HMO | Source: Ambulatory Visit | Attending: Cardiology | Admitting: Cardiology

## 2023-06-09 ENCOUNTER — Other Ambulatory Visit: Payer: Medicare HMO

## 2023-06-19 ENCOUNTER — Ambulatory Visit: Payer: Self-pay | Admitting: Cardiology

## 2023-06-20 ENCOUNTER — Ambulatory Visit: Payer: Medicare HMO | Admitting: Cardiology

## 2023-06-25 ENCOUNTER — Telehealth: Payer: Self-pay

## 2023-06-25 DIAGNOSIS — I480 Paroxysmal atrial fibrillation: Secondary | ICD-10-CM

## 2023-06-25 DIAGNOSIS — I739 Peripheral vascular disease, unspecified: Secondary | ICD-10-CM

## 2023-06-25 NOTE — Progress Notes (Signed)
 Mr. Angel [name] presents today for a one month follow up. He completed radiation treatment for thymic cancer. His treatment was from 04/24/2023 to 06/05/2023.   Pain issues, if any: Patient denies Using a feeding tube?: Patient denies Weight changes, if any: Weight has stayed stable. He eats one meal a day, doesn't feel hungry often. Swallowing issues, if any: Patient was having trouble swallowing,  upper endoscopy in March 2025 with Dr. Elnoria Howard. Patient now has no trouble swallowing now. Smoking or chewing tobacco? None Using fluoride toothpaste daily? None Last ENT visit was on:  Other notable issues, if any:  None. Patient having fatigue        Wt Readings from Last 3 Encounters:  07/18/23 115 lb (52.2 kg)  07/04/23 125 lb (56.7 kg)  06/03/23 118 lb (53.5 kg)

## 2023-06-25 NOTE — Telephone Encounter (Signed)
 Attempted outreach to Pt.  Call went to VM.  Unable to leave a message d/t mailbox was full.

## 2023-06-25 NOTE — Telephone Encounter (Signed)
 Alert received from [employer]:  Alert remote transmission:  1 tachy event 2/25 @ 17:15, EGM c/w 22 beats WCT, HR's 193-210 - route to triage 1 symptom activation, NSR with PVC's  Outreach made to Pt.

## 2023-06-26 NOTE — Telephone Encounter (Signed)
 Spoke to patients wife Tamela Oddi, who states patient is asleep and states she will have patient call when he wakes up.

## 2023-06-26 NOTE — Telephone Encounter (Signed)
 Wife Tamela Oddi) returned RN's Call.

## 2023-06-26 NOTE — Telephone Encounter (Signed)
 Wife returned call who had patient with her while on phone. Patient denies any symptoms during time of alert. Compliant with meds. Patient advised to call in the future if he has further symptoms.

## 2023-06-27 ENCOUNTER — Encounter (HOSPITAL_COMMUNITY): Payer: Self-pay | Admitting: Gastroenterology

## 2023-06-27 NOTE — Progress Notes (Signed)
Attempted to obtain medical history via telephone, unable to reach at this time. Unable to leave voicemail to return pre surgical testing department's phone call,due to mailbox full.  

## 2023-07-03 NOTE — Anesthesia Preprocedure Evaluation (Addendum)
 Anesthesia Evaluation  Patient identified by MRN, date of birth, ID band Patient awake    Reviewed: Allergy & Precautions, NPO status , Patient's Chart, lab work & pertinent test results  History of Anesthesia Complications Negative for: history of anesthetic complications  Airway Mallampati: III  TM Distance: >3 FB Neck ROM: Full    Dental  (+) Missing   Pulmonary Current Smoker and Patient abstained from smoking.   Pulmonary exam normal        Cardiovascular hypertension, Pt. on medications Normal cardiovascular exam     Neuro/Psych  Headaches  negative psych ROS   GI/Hepatic negative GI ROS,,,(+)     substance abuse  marijuana use  Endo/Other  Hypothyroidism    Renal/GU negative Renal ROS     Musculoskeletal negative musculoskeletal ROS (+)    Abdominal   Peds  Hematology  On plavix    Anesthesia Other Findings Base of tongue malignancy s/p radiation. Hx mandible surgery   Reproductive/Obstetrics                             Anesthesia Physical Anesthesia Plan  ASA: 3  Anesthesia Plan: MAC   Post-op Pain Management: Minimal or no pain anticipated   Induction:   PONV Risk Score and Plan: 1 and Propofol infusion and Treatment may vary due to age or medical condition  Airway Management Planned: Nasal Cannula and Natural Airway  Additional Equipment: None  Intra-op Plan:   Post-operative Plan:   Informed Consent: I have reviewed the patients History and Physical, chart, labs and discussed the procedure including the risks, benefits and alternatives for the proposed anesthesia with the patient or authorized representative who has indicated his/her understanding and acceptance.       Plan Discussed with: CRNA and Anesthesiologist  Anesthesia Plan Comments:        Anesthesia Quick Evaluation

## 2023-07-04 ENCOUNTER — Telehealth: Payer: Self-pay | Admitting: Radiation Oncology

## 2023-07-04 ENCOUNTER — Other Ambulatory Visit: Payer: Self-pay

## 2023-07-04 ENCOUNTER — Encounter (HOSPITAL_COMMUNITY)
Admission: RE | Disposition: A | Discharge status: Home / Self Care | Payer: Self-pay | Source: Ambulatory Visit | Attending: Gastroenterology

## 2023-07-04 ENCOUNTER — Ambulatory Visit (HOSPITAL_COMMUNITY): Payer: Self-pay | Admitting: Anesthesiology

## 2023-07-04 ENCOUNTER — Ambulatory Visit
Admission: RE | Admit: 2023-07-04 | Discharge: 2023-07-04 | Disposition: A | Discharge status: Home / Self Care | Payer: Self-pay | Source: Ambulatory Visit | Attending: Radiation Oncology | Admitting: Radiation Oncology

## 2023-07-04 ENCOUNTER — Encounter (HOSPITAL_COMMUNITY): Payer: Self-pay | Admitting: Gastroenterology

## 2023-07-04 ENCOUNTER — Ambulatory Visit (HOSPITAL_COMMUNITY)
Admission: RE | Admit: 2023-07-04 | Discharge: 2023-07-04 | Disposition: A | Discharge status: Home / Self Care | Payer: Medicare HMO | Source: Ambulatory Visit | Attending: Gastroenterology | Admitting: Gastroenterology

## 2023-07-04 ENCOUNTER — Ambulatory Visit (INDEPENDENT_AMBULATORY_CARE_PROVIDER_SITE_OTHER): Payer: Medicare HMO

## 2023-07-04 DIAGNOSIS — I10 Essential (primary) hypertension: Secondary | ICD-10-CM | POA: Insufficient documentation

## 2023-07-04 DIAGNOSIS — K8689 Other specified diseases of pancreas: Secondary | ICD-10-CM | POA: Insufficient documentation

## 2023-07-04 DIAGNOSIS — K311 Adult hypertrophic pyloric stenosis: Secondary | ICD-10-CM | POA: Insufficient documentation

## 2023-07-04 DIAGNOSIS — K222 Esophageal obstruction: Secondary | ICD-10-CM

## 2023-07-04 DIAGNOSIS — Z9889 Other specified postprocedural states: Secondary | ICD-10-CM | POA: Diagnosis not present

## 2023-07-04 DIAGNOSIS — Z85810 Personal history of malignant neoplasm of tongue: Secondary | ICD-10-CM | POA: Diagnosis not present

## 2023-07-04 DIAGNOSIS — Z7902 Long term (current) use of antithrombotics/antiplatelets: Secondary | ICD-10-CM | POA: Insufficient documentation

## 2023-07-04 DIAGNOSIS — Z923 Personal history of irradiation: Secondary | ICD-10-CM | POA: Diagnosis not present

## 2023-07-04 DIAGNOSIS — K861 Other chronic pancreatitis: Secondary | ICD-10-CM | POA: Diagnosis not present

## 2023-07-04 DIAGNOSIS — K869 Disease of pancreas, unspecified: Secondary | ICD-10-CM | POA: Diagnosis not present

## 2023-07-04 DIAGNOSIS — Z87891 Personal history of nicotine dependence: Secondary | ICD-10-CM | POA: Diagnosis not present

## 2023-07-04 DIAGNOSIS — R933 Abnormal findings on diagnostic imaging of other parts of digestive tract: Secondary | ICD-10-CM | POA: Diagnosis not present

## 2023-07-04 DIAGNOSIS — R55 Syncope and collapse: Secondary | ICD-10-CM | POA: Diagnosis not present

## 2023-07-04 DIAGNOSIS — E039 Hypothyroidism, unspecified: Secondary | ICD-10-CM | POA: Diagnosis not present

## 2023-07-04 HISTORY — PX: EUS: SHX5427

## 2023-07-04 HISTORY — PX: ESOPHAGOGASTRODUODENOSCOPY: SHX5428

## 2023-07-04 SURGERY — EGD (ESOPHAGOGASTRODUODENOSCOPY)
Anesthesia: Monitor Anesthesia Care

## 2023-07-04 MED ORDER — PROPOFOL 1000 MG/100ML IV EMUL
INTRAVENOUS | Status: AC
  Filled 2023-07-04: qty 200

## 2023-07-04 MED ORDER — PROPOFOL 1000 MG/100ML IV EMUL
INTRAVENOUS | Status: AC
  Filled 2023-07-04: qty 100

## 2023-07-04 MED ORDER — LIDOCAINE 2% (20 MG/ML) 5 ML SYRINGE
INTRAMUSCULAR | Status: DC | PRN
  Administered 2023-07-04: 40 mg via INTRAVENOUS

## 2023-07-04 MED ORDER — METOPROLOL SUCCINATE ER 25 MG PO TB24: 25.0000 mg | ORAL_TABLET | Freq: Every day | ORAL | 11 refills | Status: DC

## 2023-07-04 MED ORDER — PROPOFOL 10 MG/ML IV BOLUS
INTRAVENOUS | Status: DC | PRN
  Administered 2023-07-04 (×2): 20 mg via INTRAVENOUS

## 2023-07-04 MED ORDER — PROPOFOL 500 MG/50ML IV EMUL
INTRAVENOUS | Status: DC | PRN
  Administered 2023-07-04: 180 ug/kg/min via INTRAVENOUS

## 2023-07-04 MED ORDER — SODIUM CHLORIDE 0.9 % IV SOLN
INTRAVENOUS | Status: AC | PRN
  Administered 2023-07-04: 500 mL via INTRAMUSCULAR

## 2023-07-04 NOTE — Telephone Encounter (Signed)
 Spoke to pt's wife and r/s today's f/u appt at her request

## 2023-07-04 NOTE — Transfer of Care (Signed)
 Immediate Anesthesia Transfer of Care Note  Patient: Angel [name]  Procedure(s) Performed: UPPER ENDOSCOPIC ULTRASOUND (EUS) LINEAR EGD (ESOPHAGOGASTRODUODENOSCOPY)  Patient Location: Endoscopy Unit  Anesthesia Type:MAC  Level of Consciousness: sedated  Airway & Oxygen Therapy: Patient Spontanous Breathing and Patient connected to nasal cannula oxygen  Post-op Assessment: Report given to RN and Post -op Vital signs reviewed and stable  Post vital signs: Reviewed and stable  Last Vitals:  Vitals Value Taken Time  BP 100/66 07/04/23 0806  Temp    Pulse 91 07/04/23 0807  Resp 23 07/04/23 0807  SpO2 99 % 07/04/23 0807  Vitals shown include unfiled device data.  Last Pain:  Vitals:   07/04/23 0702  TempSrc: Tympanic  PainSc: 0-No pain         Complications: No notable events documented.

## 2023-07-04 NOTE — Progress Notes (Signed)
 [facility] Summary Report / Loop Recorder

## 2023-07-04 NOTE — Telephone Encounter (Addendum)
 Spoke with patient's wife, Tamela Oddi. She verbalizes understanding of instructions to have pt start metoprolol succinate 25mg  daily. 30-day supply with refills sent electronically to preferred pharmacy. Reviewed signs/symptoms of hypotension; Tamela Oddi agrees to call if any noted. Reviewed upcoming appointments; pt has a recall for 10/2023 with Dr. Jacinto Halim. Noted pt no showed ABI appt on 06/09/23. Tamela Oddi agrees to have reschedule, states she didn't have this appt on her calendar.   Spoke with Dr. Jacinto Halim, who agreed to reorder ABIs (original order expired). Order placed. Will forward to scheduling team for assistance.
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
## 2023-07-04 NOTE — Discharge Instructions (Signed)
YOU HAD AN ENDOSCOPIC PROCEDURE TODAY: Refer to the procedure report and other information in the discharge instructions given to you for any specific questions about what was found during the examination. If this information does not answer your questions, please call [facility] at [phone] to clarify.  ? ?YOU SHOULD EXPECT: Some feelings of bloating in the abdomen. Passage of more gas than usual. Walking can help get rid of the air that was put into your GI tract during the procedure and reduce the bloating. ? ?DIET: Your first meal following the procedure should be a light meal and then it is ok to progress to your normal diet. A half-sandwich or bowl of soup is an example of a good first meal. Heavy or fried foods are harder to digest and may make you feel nauseous or bloated. Drink plenty of fluids but you should avoid alcoholic beverages for 24 hours.  ? ?ACTIVITY: Your care partner should take you home directly after the procedure. You should plan to take it easy, moving slowly for the rest of the day. You can resume normal activity the day after the procedure however YOU SHOULD NOT DRIVE, use power tools, machinery or perform tasks that involve climbing or major physical exertion for 24 hours (because of the sedation medicines used during the test).  ? ?SYMPTOMS TO REPORT IMMEDIATELY: ?A gastroenterologist can be reached at any hour. Please call [phone]  for any of the following symptoms:  ? ?Following upper endoscopy (EGD, EUS, ERCP, esophageal dilation) ?Vomiting of blood or coffee ground material  ?New, significant abdominal pain  ?New, significant chest pain or pain under the shoulder blades  ?Painful or persistently difficult swallowing  ?New shortness of breath  ?Black, tarry-looking or red, bloody stools ? ?FOLLOW UP:  ?If any biopsies were taken you will be contacted by phone or by letter within the next 1-3 weeks. Call [phone]  if you have not heard about the biopsies in 3  weeks.  ?Please also call with any specific questions about appointments or follow up tests.  ?

## 2023-07-04 NOTE — Op Note (Signed)
 [facility] Patient Name: Angel [name] Procedure Date: 07/04/2023 MRN: [id number] Attending MD: Jeani Hawking , MD, [id number] Date of Birth: April 12, 1951 CSN: [id number] Age: 73 Admit Type: Outpatient Procedure:                Upper EUS Indications:              Dilated pancreatic duct on CT scan Providers:                Jeani Hawking, MD, Stephens Shire RN, RN, Rhodia Albright,                            Technician Referring MD:              Medicines:                Propofol per Anesthesia Complications:            No immediate complications. Estimated Blood Loss:     Estimated blood loss: none. Procedure:                Pre-Anesthesia Assessment:                           - Prior to the procedure, a History and Physical                            was performed, and patient medications and                            allergies were reviewed. The patient's tolerance of                            previous anesthesia was also reviewed. The risks                            and benefits of the procedure and the sedation                            options and risks were discussed with the patient.                            All questions were answered, and informed consent                            was obtained. Prior Anticoagulants: The patient has                            taken Plavix (clopidogrel), last dose was 1 day                            prior to procedure. ASA Grade Assessment: III - A                            patient with severe systemic disease. After  reviewing the risks and benefits, the patient was                            deemed in satisfactory condition to undergo the                            procedure.                           - Sedation was administered by an anesthesia                            professional. Deep sedation was attained.                           After obtaining informed consent, the endoscope was                             passed under direct vision. Throughout the                            procedure, the patient's blood pressure, pulse, and                            oxygen saturations were monitored continuously. The                            [id number] ([id number]) Olympus linear ultrasound scope                            was introduced through the mouth, and advanced to                            the second part of duodenum. The GIF-H190 ([id number])                            Olympus endoscope was introduced through the mouth,                            and advanced to the second part of duodenum. The                            upper EUS was accomplished without difficulty. The                            patient tolerated the procedure well. Scope In: Scope Out: Findings:      ENDOSCOPIC FINDING: :      One benign-appearing, intrinsic mild stenosis was found in the lower       third of the esophagus. This stenosis measured 1 cm (inner diameter) x       less than one cm (in length). The stenosis was traversed.      A benign-appearing, intrinsic mild stenosis was found at the pylorus.       This was traversed.      ENDOSONOGRAPHIC  FINDING: :      The pancreatic duct had a dilated endosonographic appearance in the       entire pancreas. The pancreatic duct measured up to 4 mm in diameter.      Pancreatic parenchymal abnormalities were noted in the pancreatic head.       These consisted of lobularity.      With the history of the distal esophageal stricture an EGD was       performed. There was a mild stenosis and this was disrupted with focused       cold biopsies. This method was used as he only held his Plavix one day       before the procedure. This released the stenosis. No excessive bleeding       was noted. The EUS scope was then used and with gentle forward pressure       the echoencoscope slowly passed through the stenosis. A pyloric channel       stenosis was identified only after  passage of the echoendoscope as it       dilated this region. The pancreas was identified with ultrasound and the       PD ranged from 3 mm - 4 mm. The area of greatest dilation was in the PD       neck. In the had of the pancreas there was evidence of chronic       pancreatitis with lobularity. No masses or cysts were found. The CBD was       normal in caliber from 3-5 mm and the gallbladder was normal. No stones       or sludge was identified. Impression:               - Benign-appearing esophageal stenosis.                           - Gastric stenosis was found at the pylorus.                           - Chronic pancreatitis in the head of the pancreas.                           - The pancreatic duct had a dilated endosonographic                            appearance in the entire pancreas. The pancreatic                            duct measured up to 4 mm in diameter.                           - Pancreatic parenchymal abnormalities consisting                            of lobularity were noted in the pancreatic head.                           - No specimens collected. Moderate Sedation:      Not Applicable - Patient had care per Anesthesia. Recommendation:           -  Patient has a contact number available for                            emergencies. The signs and symptoms of potential                            delayed complications were discussed with the                            patient. Return to normal activities tomorrow.                            Written discharge instructions were provided to the                            patient.                           - Resume regular diet.                           - Resume Plavix tomorrow. Procedure Code(s):        --- Professional ---                           [id number], Esophagogastroduodenoscopy, flexible,                            transoral; with endoscopic ultrasound examination                            limited to the esophagus,  stomach or duodenum, and                            adjacent structures Diagnosis Code(s):        --- Professional ---                           K22.2, Esophageal obstruction                           K31.1, Adult hypertrophic pyloric stenosis                           K86.9, Disease of pancreas, unspecified                           K86.89, Other specified diseases of pancreas                           R93.3, Abnormal findings on diagnostic imaging of                            other parts of digestive tract CPT copyright 2022 [employer]. All rights reserved. The codes documented in this report are preliminary and upon coder review may  be revised to meet current compliance requirements. Jeani Hawking, MD  Jeani Hawking, MD 07/04/2023 8:17:07 AM This report has been signed electronically. Number of Addenda: 0

## 2023-07-04 NOTE — Anesthesia Postprocedure Evaluation (Signed)
 Anesthesia Post Note  Patient: Patricia Nettle  Procedure(s) Performed: UPPER ENDOSCOPIC ULTRASOUND (EUS) LINEAR EGD (ESOPHAGOGASTRODUODENOSCOPY)     Patient location during evaluation: PACU Anesthesia Type: MAC Level of consciousness: awake and alert Pain management: pain level controlled Vital Signs Assessment: post-procedure vital signs reviewed and stable Respiratory status: spontaneous breathing, nonlabored ventilation and respiratory function stable Cardiovascular status: blood pressure returned to baseline Anesthetic complications: no   No notable events documented.  Last Vitals:  Vitals:   07/04/23 0832 07/04/23 0835  BP: (!) 169/113 (!) 184/103  Pulse: 84 85  Resp: (!) 21 19  Temp:    SpO2: 95% 93%    Last Pain:  Vitals:   07/04/23 0832  TempSrc:   PainSc: 0-No pain                 Beryle Lathe

## 2023-07-06 ENCOUNTER — Encounter (HOSPITAL_COMMUNITY): Payer: Self-pay | Admitting: Gastroenterology

## 2023-07-06 LAB — CUP PACEART REMOTE DEVICE CHECK
Date Time Interrogation Session: 20250307021332
Implantable Pulse Generator Implant Date: 20240625
Pulse Gen Model: 5000
Pulse Gen Serial Number: 511029626

## 2023-07-08 DIAGNOSIS — R630 Anorexia: Secondary | ICD-10-CM | POA: Diagnosis not present

## 2023-07-08 DIAGNOSIS — E039 Hypothyroidism, unspecified: Secondary | ICD-10-CM | POA: Diagnosis not present

## 2023-07-08 DIAGNOSIS — E559 Vitamin D deficiency, unspecified: Secondary | ICD-10-CM | POA: Diagnosis not present

## 2023-07-08 DIAGNOSIS — E785 Hyperlipidemia, unspecified: Secondary | ICD-10-CM | POA: Diagnosis not present

## 2023-07-15 ENCOUNTER — Telehealth: Payer: Self-pay

## 2023-07-15 ENCOUNTER — Other Ambulatory Visit: Payer: Self-pay

## 2023-07-15 DIAGNOSIS — E785 Hyperlipidemia, unspecified: Secondary | ICD-10-CM | POA: Diagnosis not present

## 2023-07-15 DIAGNOSIS — R931 Abnormal findings on diagnostic imaging of heart and coronary circulation: Secondary | ICD-10-CM | POA: Diagnosis not present

## 2023-07-15 DIAGNOSIS — E039 Hypothyroidism, unspecified: Secondary | ICD-10-CM | POA: Diagnosis not present

## 2023-07-15 DIAGNOSIS — Z0000 Encounter for general adult medical examination without abnormal findings: Secondary | ICD-10-CM | POA: Diagnosis not present

## 2023-07-15 DIAGNOSIS — F5104 Psychophysiologic insomnia: Secondary | ICD-10-CM | POA: Diagnosis not present

## 2023-07-15 DIAGNOSIS — F418 Other specified anxiety disorders: Secondary | ICD-10-CM | POA: Diagnosis not present

## 2023-07-15 DIAGNOSIS — W19XXXA Unspecified fall, initial encounter: Secondary | ICD-10-CM

## 2023-07-15 DIAGNOSIS — R630 Anorexia: Secondary | ICD-10-CM | POA: Diagnosis not present

## 2023-07-15 DIAGNOSIS — J432 Centrilobular emphysema: Secondary | ICD-10-CM | POA: Diagnosis not present

## 2023-07-15 DIAGNOSIS — R634 Abnormal weight loss: Secondary | ICD-10-CM | POA: Diagnosis not present

## 2023-07-15 NOTE — Telephone Encounter (Signed)
 Alert received from [employer] for 1 Symptom event on 07/13/23 at 09:05 for c/o dizzy and fainted, ECG c/w SB/SR with PAC ectopy; routed to clinic for symptom of fainted with implant indication of syncope.

## 2023-07-15 NOTE — Telephone Encounter (Signed)
Attempted to contact patient. No answer, left message to call back

## 2023-07-16 NOTE — Telephone Encounter (Signed)
 Patient's wife pushed the symptom notification on his APP after he lost his balance, fell and hit his head.   She states that patient did not have any symptoms at time of event/fall: no dizziness and he DID NOT pass out as was initially reported.  He is doing well since event, labs and vital signs stable per wife at OV with PCP.     Since event on 3/16, patient has been seen by his PCP and a CT scan has been ordered.   I did share with wife that patient had some noted bradycardia (SR/SB with ectopy) at time of event, but as he was not symptomatic and he lost his balance, not sure that it is related.  Glad he is getting a CT scan and will forward to Dr. Jimmey Ralph to review.   They are aware if event happens again with falling/head injury they should go to the ER. Patient is on Plavix.

## 2023-07-17 ENCOUNTER — Ambulatory Visit
Admission: RE | Admit: 2023-07-17 | Discharge: 2023-07-17 | Disposition: A | Discharge status: Home / Self Care | Source: Ambulatory Visit

## 2023-07-17 DIAGNOSIS — R42 Dizziness and giddiness: Secondary | ICD-10-CM | POA: Diagnosis not present

## 2023-07-17 DIAGNOSIS — I6782 Cerebral ischemia: Secondary | ICD-10-CM | POA: Diagnosis not present

## 2023-07-17 DIAGNOSIS — W19XXXA Unspecified fall, initial encounter: Secondary | ICD-10-CM

## 2023-07-18 ENCOUNTER — Ambulatory Visit
Admission: RE | Admit: 2023-07-18 | Discharge: 2023-07-18 | Disposition: A | Discharge status: Home / Self Care | Source: Ambulatory Visit | Attending: Radiation Oncology | Admitting: Radiation Oncology

## 2023-07-18 ENCOUNTER — Encounter: Payer: Self-pay | Admitting: Radiation Oncology

## 2023-07-18 VITALS — BP 124/87 | HR 65 | Temp 97.4°F | Resp 20 | Ht 65.0 in | Wt 115.0 lb

## 2023-07-18 DIAGNOSIS — C37 Malignant neoplasm of thymus: Secondary | ICD-10-CM

## 2023-07-18 DIAGNOSIS — T148XXA Other injury of unspecified body region, initial encounter: Secondary | ICD-10-CM

## 2023-07-18 DIAGNOSIS — C01 Malignant neoplasm of base of tongue: Secondary | ICD-10-CM

## 2023-07-18 NOTE — Progress Notes (Signed)
 Radiation Oncology         [phone] ________________________________  Name: Angel [name] MRN: [id number]  Date: 07/18/2023  DOB: 1951/01/25  Follow-Up Visit Note  Outpatient  CC: Angel Galas, NP  Angel Galas, NP  Diagnosis and Prior Radiotherapy: C37   ICD-10-CM   1. Cancer of base of tongue (HCC)  C01     2. Thymic carcinoma (HCC)  C37      Cancer Staging  Cancer of base of tongue (HCC) Staging form: Pharynx - Oropharynx, AJCC 7th Edition - Clinical: Stage IVB (T2, N3, M0, Free text: HPV negative) - Signed by Artis Delay, MD on 01/01/2014 Cancer stage: HPV negative  Thymic carcinoma (HCC) Staging form: Thymus, AJCC 8th Edition - Pathologic stage from 03/25/2023: Stage IVA (pT1a, pN1, cM0) - Signed by Artis Delay, MD on 03/25/2023 Stage prefix: Initial diagnosis   First Treatment Date: 2023-04-24 Last Treatment Date: 2023-06-05 Plan Name: Chest_Thymic Site: Thymus Technique: IMRT Mode: Photon Dose Per Fraction: 1.8 Gy Prescribed Dose (Delivered / Prescribed): 50.4 Gy / 50.4 Gy Prescribed Fxs (Delivered / Prescribed): 28 / 28    Diagnosis: T2N3M0 IVB squamous cell carcinoma, base of tongue Indication for treatment:  Curative, following induction chemotherapy      Radiation treatment dates:   01/26/2014-03/18/2014 Site/dose:   Base of tongue and bilateral neck / 70 Gy in 35 fractions to gross disease, 63 Gy in 35 fractions to high risk nodal echelons, and 56 Gy in 35 fractions to intermediate risk nodal echelons Beams/energy:   Helical IMRT / 6 MV photons CHIEF COMPLAINT: Here for follow-up and surveillance of thymic cancer  Narrative:  The patient returns today for routine follow-up.  The patient, with a history of radiation therapy most recently for thymic carcinoma, presents with ongoing fatigue and poor appetite. He reports that his appetite issues are similar to those experienced prior to radiation therapy. Despite these issues, his swallowing has improved.   He  has a scheduled scan in April and see Dr Bertis Ruddy soon after. He also reports some skin darkening and dryness in front of his sternum, which could be radiation effects.                              ALLERGIES:  has no allergies on file.  Meds: Current Outpatient Medications  Medication Sig Dispense Refill   albuterol (VENTOLIN HFA) 108 (90 Base) MCG/ACT inhaler Inhale 2 puffs into the lungs every 6 (six) hours as needed for wheezing or shortness of breath. 8 g 2   Cholecalciferol (VITAMIN D3) 50 MCG (2000 UT) TABS Take 2,000 Units by mouth daily.     clopidogrel (PLAVIX) 75 MG tablet Take 1 tablet (75 mg total) by mouth daily.     cyanocobalamin (VITAMIN B12) 1000 MCG tablet Take 1 tablet (1,000 mcg total) by mouth daily. 60 tablet 0   docusate sodium (COLACE) 100 MG capsule Take 1 capsule (100 mg total) by mouth 2 (two) times daily. 10 capsule 0   escitalopram (LEXAPRO) 20 MG tablet Take 20 mg by mouth daily.     ferrous sulfate 325 (65 FE) MG EC tablet Take 1 tablet (325 mg total) by mouth 2 (two) times daily. 60 tablet 3   gabapentin (NEURONTIN) 300 MG capsule Take 1 capsule (300 mg total) by mouth at bedtime. 30 capsule 0   guaiFENesin (MUCINEX) 600 MG 12 hr tablet Take 1 tablet (600 mg total) by mouth 2 (  two) times daily. 60 tablet 2   levothyroxine (SYNTHROID) 88 MCG tablet Take 88 mcg by mouth daily.     losartan (COZAAR) 25 MG tablet Take 1 tablet by mouth in the evening 30 tablet 9   methocarbamol (ROBAXIN) 500 MG tablet Take 500 mg by mouth every 6 (six) hours as needed for muscle spasms.     metoprolol succinate (TOPROL XL) 25 MG 24 hr tablet Take 1 tablet (25 mg total) by mouth daily. 30 tablet 11   Multiple Vitamin (MULTIVITAMIN WITH MINERALS) TABS tablet Take 1 tablet by mouth daily. Centrum 50+     pantoprazole (PROTONIX) 40 MG tablet Take 1 tablet (40 mg total) by mouth 2 (two) times daily before a meal. 60 tablet 0   polyethylene glycol (MIRALAX / GLYCOLAX) 17 g packet Take 17 g  by mouth daily. 14 each 0   rosuvastatin (CRESTOR) 20 MG tablet Take 1 tablet by mouth once daily 90 tablet 0   traMADol (ULTRAM) 50 MG tablet Take 50 mg by mouth 4 (four) times daily as needed.     No current facility-administered medications for this encounter.    Physical Findings: The patient is in no acute distress. Patient is alert and oriented.  height is 5\' 5"  (1.651 m) and weight is 115 lb (52.2 kg). His temperature is 97.4 F (36.3 C) (abnormal). His blood pressure is 124/87 and his pulse is 65. His respiration is 20 and oxygen saturation is 98%. Marland Kitchen     HEENT: Upper throat without lesions, mucosa moist. Oral cavity clear. NECK: No palpable masses. Telangiectasias over left lower neck. CHEST: Lungs clear to auscultation bilaterally. CARDIOVASCULAR: Heart regular rate and rhythm. SKIN: Darkening and dryness in skin over sternum.  Lab Findings: Lab Results  Component Value Date   WBC 8.6 04/09/2023   HGB 9.3 (L) 04/09/2023   HCT 31.6 (L) 04/09/2023   MCV 79.2 (L) 04/09/2023   PLT 629 (H) 04/09/2023    Radiographic Findings: CT HEAD WO CONTRAST ( ) Result Date: 07/17/2023 CLINICAL DATA:  Injury due to fall, hitting the back of the head 1 week ago. Vertigo. EXAM: CT HEAD WITHOUT CONTRAST TECHNIQUE: Contiguous axial images were obtained from the base of the skull through the vertex without intravenous contrast. RADIATION DOSE REDUCTION: This exam was performed according to the departmental dose-optimization program which includes automated exposure control, adjustment of the mA and/or kV according to patient size and/or use of iterative reconstruction technique. COMPARISON:  Head CT 03/24/2022 FINDINGS: Brain: There is no evidence of an acute infarct, intracranial hemorrhage, mass, midline shift, or extra-axial fluid collection. Confluent hypodensities in the cerebral white matter bilaterally are similar to the prior CT and are nonspecific but compatible with severe chronic small  vessel ischemic disease. Chronic lacunar infarcts are again seen in the thalami and basal ganglia bilaterally. Ventriculomegaly is unchanged, and while this may be secondary to advanced cerebral atrophy, a component of normal pressure hydrocephalus is also possible given an acute callosal angle and gyral crowding at the vertex. Vascular: Calcified atherosclerosis at the skull base. No hyperdense vessel. Skull: No acute fracture or suspicious lesion. Sinuses/Orbits: Remote medial right orbital fracture. The included paranasal sinuses are well aerated. Trace right mastoid effusion. Other: None. IMPRESSION: 1. No evidence of acute intracranial abnormality. 2. Severe chronic small vessel ischemic disease. 3. Unchanged ventriculomegaly which may be secondary to advanced cerebral atrophy although a component of normal pressure hydrocephalus is also possible. Electronically Signed   By: Jolaine Click.D.  On: 07/17/2023 12:01   CUP PACEART REMOTE DEVICE CHECK Result Date: 07/04/2023 ILR summary report received. Battery status OK. Normal device function. No new symptom, tachy, brady, or pause episodes. No new AF episodes. Monthly summary reports and ROV/PRN ML, CVRS ILR summary report received. Battery status OK. Normal device function. No new symptom, brady, or pause episodes. No new AF episodes. Previous tachy and AF episodes were viewed and reviewed.   Monthly summary reports and ROV/PRN ML, CVRS   Impression/Plan:   Cancer of base of tongue (HCC)  Thymic carcinoma (HCC)  (BOT cancer is cured; recently completed treatment for Thymic carcinoma)  Radiation dermatitis Darkening and dryness over sternum due to radiation. No severe skin changes. - Apply emollient or petrolatum to affected area twice daily.  Fatigue Ongoing fatigue post-radiation, not worsening.  Decreased appetite Persistent decreased appetite since before radiation. Swallowing improved. Push calories as tolerated  Follow-up Scheduled  scans in April per med/onc and follow-ups to monitor remission and surgical outcomes.   - Ensure follow-up with Dr. Dorris Fetch in June. Wife will call his office. - Attend scan and follow-up with Dr. Bertis Ruddy in April. - Attend head and neck survivorship celebration on April 22nd if possible. - Rad onc will see him back PRN.     On date of service, in total, I spent 25 minutes on this encounter. Patient was seen in person.  _____________________________________   Lonie Peak, MD

## 2023-07-18 NOTE — Progress Notes (Addendum)
 Mr. Tramayne Sebesta presents today for a one month follow up. He completed radiation treatment for thymic cancer. His treatment was from 04/24/2023 to 06/05/2023.     Pain issues, if any: Patient denies Using a feeding tube?: Patient denies Weight changes, if any: Weight has stayed stable. He eats one meal a day, doesn't feel hungry often. Swallowing issues, if any: Patient was having trouble swallowing,  upper endoscopy in March 2025 with Dr. Elnoria Howard. Patient now has no trouble swallowing now. Smoking or chewing tobacco? None Using fluoride toothpaste daily? None Last ENT visit was on: Patient not sure Other notable issues, if any:  Patient having fatigue, encouraged to get enough sleep and increase his nutrition levels. Temp 97.4  B/P 124/87,   Pulse 65,    Spo2 98%    Respirations 20

## 2023-07-22 ENCOUNTER — Other Ambulatory Visit: Payer: Self-pay | Admitting: Cardiology

## 2023-07-22 DIAGNOSIS — E7800 Pure hypercholesterolemia, unspecified: Secondary | ICD-10-CM

## 2023-07-31 ENCOUNTER — Telehealth: Payer: Self-pay

## 2023-07-31 NOTE — Telephone Encounter (Signed)
 Alert remote transmission:  symptom activation 4/2 @ 12:16 EGM c/w SR with PVC's  LM with wife to have him call back. States usually she is the one to hit the sx button - this may have been in error. She will have him call us back after he wakes up.

## 2023-07-31 NOTE — Telephone Encounter (Signed)
 Patient states that he pressed it by accident. No symptoms.

## 2023-08-07 NOTE — Addendum Note (Signed)
 Addended by: Elease Etienne A on: 08/07/2023 10:15 AM   Modules accepted: Orders

## 2023-08-07 NOTE — Progress Notes (Signed)
 [facility] Summary Report / Loop Recorder

## 2023-08-08 ENCOUNTER — Ambulatory Visit (INDEPENDENT_AMBULATORY_CARE_PROVIDER_SITE_OTHER): Payer: Medicare HMO

## 2023-08-08 DIAGNOSIS — R55 Syncope and collapse: Secondary | ICD-10-CM | POA: Diagnosis not present

## 2023-08-09 LAB — CUP PACEART REMOTE DEVICE CHECK
Date Time Interrogation Session: 20250411070204
Implantable Pulse Generator Implant Date: 20240625
Pulse Gen Model: 5000
Pulse Gen Serial Number: 511029626

## 2023-08-11 ENCOUNTER — Encounter (HOSPITAL_COMMUNITY): Payer: Self-pay

## 2023-08-11 ENCOUNTER — Ambulatory Visit (HOSPITAL_COMMUNITY)
Admission: RE | Admit: 2023-08-11 | Discharge: 2023-08-11 | Disposition: A | Discharge status: Home / Self Care | Payer: Medicare HMO | Source: Ambulatory Visit | Attending: Hematology and Oncology | Admitting: Hematology and Oncology

## 2023-08-11 ENCOUNTER — Inpatient Hospital Stay: Payer: Medicare HMO | Attending: Hematology and Oncology

## 2023-08-11 DIAGNOSIS — Z923 Personal history of irradiation: Secondary | ICD-10-CM | POA: Insufficient documentation

## 2023-08-11 DIAGNOSIS — C37 Malignant neoplasm of thymus: Secondary | ICD-10-CM

## 2023-08-11 DIAGNOSIS — Z85810 Personal history of malignant neoplasm of tongue: Secondary | ICD-10-CM | POA: Insufficient documentation

## 2023-08-11 DIAGNOSIS — K8689 Other specified diseases of pancreas: Secondary | ICD-10-CM | POA: Diagnosis not present

## 2023-08-11 DIAGNOSIS — E440 Moderate protein-calorie malnutrition: Secondary | ICD-10-CM

## 2023-08-11 DIAGNOSIS — Z9221 Personal history of antineoplastic chemotherapy: Secondary | ICD-10-CM | POA: Insufficient documentation

## 2023-08-11 DIAGNOSIS — R1310 Dysphagia, unspecified: Secondary | ICD-10-CM | POA: Insufficient documentation

## 2023-08-11 DIAGNOSIS — I7091 Generalized atherosclerosis: Secondary | ICD-10-CM | POA: Insufficient documentation

## 2023-08-11 DIAGNOSIS — J432 Centrilobular emphysema: Secondary | ICD-10-CM | POA: Diagnosis not present

## 2023-08-11 DIAGNOSIS — D539 Nutritional anemia, unspecified: Secondary | ICD-10-CM

## 2023-08-11 LAB — CBC WITH DIFFERENTIAL/PLATELET
Abs Immature Granulocytes: 0.05 10*3/uL (ref 0.00–0.07)
Basophils Absolute: 0.1 10*3/uL (ref 0.0–0.1)
Basophils Relative: 2 %
Eosinophils Absolute: 0.3 10*3/uL (ref 0.0–0.5)
Eosinophils Relative: 5 %
HCT: 42.9 % (ref 39.0–52.0)
Hemoglobin: 13.7 g/dL (ref 13.0–17.0)
Immature Granulocytes: 1 %
Lymphocytes Relative: 9 %
Lymphs Abs: 0.5 10*3/uL — ABNORMAL LOW (ref 0.7–4.0)
MCH: 28.3 pg (ref 26.0–34.0)
MCHC: 31.9 g/dL (ref 30.0–36.0)
MCV: 88.6 fL (ref 80.0–100.0)
Monocytes Absolute: 0.6 10*3/uL (ref 0.1–1.0)
Monocytes Relative: 11 %
Neutro Abs: 4.1 10*3/uL (ref 1.7–7.7)
Neutrophils Relative %: 72 %
Platelets: 333 10*3/uL (ref 150–400)
RBC: 4.84 MIL/uL (ref 4.22–5.81)
RDW: 18.8 % — ABNORMAL HIGH (ref 11.5–15.5)
WBC: 5.7 10*3/uL (ref 4.0–10.5)
nRBC: 0 % (ref 0.0–0.2)

## 2023-08-11 LAB — CMP (CANCER CENTER ONLY)
ALT: 14 U/L (ref 0–44)
AST: 28 U/L (ref 15–41)
Albumin: 4.7 g/dL (ref 3.5–5.0)
Alkaline Phosphatase: 54 U/L (ref 38–126)
Anion gap: 6 (ref 5–15)
BUN: 10 mg/dL (ref 8–23)
CO2: 30 mmol/L (ref 22–32)
Calcium: 10.2 mg/dL (ref 8.9–10.3)
Chloride: 105 mmol/L (ref 98–111)
Creatinine: 1.08 mg/dL (ref 0.61–1.24)
GFR, Estimated: >60 mL/min (ref >60)
Glucose, Bld: 105 mg/dL — ABNORMAL HIGH (ref 70–99)
Potassium: 4.8 mmol/L (ref 3.5–5.1)
Sodium: 141 mmol/L (ref 135–145)
Total Bilirubin: 0.4 mg/dL (ref 0.0–1.2)
Total Protein: 7.4 g/dL (ref 6.5–8.1)

## 2023-08-11 LAB — TSH: TSH: 3.98 u[IU]/mL (ref 0.350–4.500)

## 2023-08-11 LAB — IRON AND IRON BINDING CAPACITY (CC-WL,HP ONLY)
Iron: 72 ug/dL (ref 45–182)
Saturation Ratios: 16 % — ABNORMAL LOW (ref 17.9–39.5)
TIBC: 438 ug/dL (ref 250–450)
UIBC: 366 ug/dL (ref 117–376)

## 2023-08-11 LAB — FERRITIN: Ferritin: 9 ng/mL — ABNORMAL LOW (ref 24–336)

## 2023-08-11 MED ORDER — IOHEXOL 300 MG/ML  SOLN
100.0000 mL | Freq: Once | INTRAMUSCULAR | Status: AC | PRN
  Administered 2023-08-11: 100 mL via INTRAVENOUS

## 2023-08-11 MED ORDER — IOHEXOL 9 MG/ML PO SOLN
1000.0000 mL | ORAL | Status: AC
  Administered 2023-08-11: 1000 mL via ORAL

## 2023-08-11 MED ORDER — SODIUM CHLORIDE (PF) 0.9 % IJ SOLN
INTRAMUSCULAR | Status: AC
  Filled 2023-08-11: qty 50

## 2023-08-11 MED ORDER — IOHEXOL 9 MG/ML PO SOLN
ORAL | Status: AC
  Filled 2023-08-11: qty 1000

## 2023-08-19 ENCOUNTER — Telehealth: Payer: Self-pay

## 2023-08-19 ENCOUNTER — Inpatient Hospital Stay: Payer: Medicare HMO | Admitting: Hematology and Oncology

## 2023-08-19 NOTE — Telephone Encounter (Signed)
 Called regarding missing today's appt. Rescheduled to 5/1 at 1200, wife is aware of appt.

## 2023-08-28 ENCOUNTER — Encounter: Payer: Self-pay | Admitting: Hematology and Oncology

## 2023-08-28 ENCOUNTER — Inpatient Hospital Stay: Attending: Radiation Oncology | Admitting: Hematology and Oncology

## 2023-08-28 VITALS — BP 117/8 | HR 69 | Temp 99.2°F | Resp 18 | Ht 65.0 in | Wt 114.2 lb

## 2023-08-28 DIAGNOSIS — C37 Malignant neoplasm of thymus: Secondary | ICD-10-CM

## 2023-08-28 DIAGNOSIS — R636 Underweight: Secondary | ICD-10-CM

## 2023-08-28 DIAGNOSIS — Z85810 Personal history of malignant neoplasm of tongue: Secondary | ICD-10-CM | POA: Diagnosis not present

## 2023-08-28 DIAGNOSIS — D509 Iron deficiency anemia, unspecified: Secondary | ICD-10-CM | POA: Diagnosis not present

## 2023-08-28 DIAGNOSIS — Z923 Personal history of irradiation: Secondary | ICD-10-CM | POA: Diagnosis not present

## 2023-08-28 DIAGNOSIS — K869 Disease of pancreas, unspecified: Secondary | ICD-10-CM

## 2023-08-28 DIAGNOSIS — Z87891 Personal history of nicotine dependence: Secondary | ICD-10-CM | POA: Diagnosis not present

## 2023-08-28 DIAGNOSIS — E039 Hypothyroidism, unspecified: Secondary | ICD-10-CM

## 2023-08-28 NOTE — Assessment & Plan Note (Addendum)
 He has no further weight loss but he is not gaining weight We discussed importance of adequate nutritional intake and regular exercise

## 2023-08-28 NOTE — Assessment & Plan Note (Addendum)
 The patient has remote history of stage IV tongue cancer that was treated with chemoradiation therapy He was subsequently diagnosed with stage IV thymic carcinoma status post surgery followed by adjuvant radiation treatment Pathology: Thymic squamous cell carcinoma  I reviewed imaging studies with the patient and his wife He has no signs of cancer recurrence I plan to repeat imaging study again in 6 months for further follow-up In the meantime, we discussed importance of supportive care

## 2023-08-28 NOTE — Assessment & Plan Note (Addendum)
 He is no longer anemic but has persistent iron deficiency I recommend the patient to continue on oral iron supplement

## 2023-08-28 NOTE — Assessment & Plan Note (Addendum)
 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 

## 2023-08-28 NOTE — Assessment & Plan Note (Addendum)
 We will continue monitoring Plan to order CT imaging of the chest, abdomen and pelvis to assess in 6 months

## 2023-08-28 NOTE — Progress Notes (Signed)
 [facility] OFFICE PROGRESS NOTE  Patient Care Team: Yvonnie Heritage, NP as PCP - General Knox Perl, MD as PCP - Cardiology (Cardiology)  Assessment & Plan Thymic carcinoma [facility]) The patient has remote history of stage IV tongue cancer that was treated with chemoradiation therapy He was subsequently diagnosed with stage IV thymic carcinoma status post surgery followed by adjuvant radiation treatment Pathology: Thymic squamous cell carcinoma  I reviewed imaging studies with the patient and his wife He has no signs of cancer recurrence I plan to repeat imaging study again in 6 months for further follow-up In the meantime, we discussed importance of supportive care Iron deficiency anemia, unspecified iron deficiency anemia type He is no longer anemic but has persistent iron deficiency I recommend the patient to continue on oral iron supplement Acquired hypothyroidism 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 His recent TSH was normal He will continue current prescription Synthroid  Underweight He has no further weight loss but he is not gaining weight We discussed importance of adequate nutritional intake and regular exercise Lesion of pancreas We will continue monitoring Plan to order CT imaging of the chest, abdomen and pelvis to assess in 6 months  Orders Placed This Encounter  Procedures   CT CHEST ABDOMEN PELVIS W CONTRAST    Standing Status:   Future    Expected Date:   02/28/2024    Expiration Date:   08/27/2024    If indicated for the ordered procedure, I authorize the administration of contrast media per Radiology protocol:   Yes    Does the patient have a contrast media/X-ray dye allergy?:   No    Preferred imaging location?:   [facility]    If indicated for the ordered procedure, I authorize the administration of oral contrast media per Radiology protocol:   Yes   CMP (Cancer Center only)    Standing Status:   Future    Expiration Date:    08/27/2024   CBC with Differential (Cancer Center Only)    Standing Status:   Future    Expiration Date:   08/27/2024   Iron and Iron Binding Capacity ([facility] only)    Standing Status:   Future    Expiration Date:   08/27/2024   Ferritin    Standing Status:   Future    Expiration Date:   08/27/2024     Almeda Jacobs, MD  INTERVAL HISTORY: he returns for surveillance follow-up review of test results He is here accompanied by his wife He has not gained any weight since last time I saw him He is eating better and has more energy No recent smoking I reviewed his medication list and discussed blood work and imaging study results I reviewed multiple imaging studies with him and his wife  PHYSICAL EXAMINATION: ECOG PERFORMANCE STATUS: 0 - Asymptomatic  Vitals:   08/28/23 1209  BP: (!) 117/8  Pulse: 69  Resp: 18  Temp: 99.2 F (37.3 C)  SpO2: 95%   Filed Weights   08/28/23 1209  Weight: 114 lb 3.2 oz (51.8 kg)    Relevant data reviewed during this visit included CBC, CMP, TSH, iron studies, CT imaging of the chest, abdomen and pelvis from April 2025 in comparison to September 2024 imaging studies

## 2023-09-01 DIAGNOSIS — E785 Hyperlipidemia, unspecified: Secondary | ICD-10-CM | POA: Diagnosis not present

## 2023-09-01 DIAGNOSIS — E039 Hypothyroidism, unspecified: Secondary | ICD-10-CM | POA: Diagnosis not present

## 2023-09-05 DIAGNOSIS — R630 Anorexia: Secondary | ICD-10-CM | POA: Diagnosis not present

## 2023-09-05 DIAGNOSIS — Z85238 Personal history of other malignant neoplasm of thymus: Secondary | ICD-10-CM | POA: Diagnosis not present

## 2023-09-05 DIAGNOSIS — R634 Abnormal weight loss: Secondary | ICD-10-CM | POA: Diagnosis not present

## 2023-09-05 DIAGNOSIS — E039 Hypothyroidism, unspecified: Secondary | ICD-10-CM | POA: Diagnosis not present

## 2023-09-11 ENCOUNTER — Telehealth: Payer: Self-pay

## 2023-09-11 NOTE — Telephone Encounter (Addendum)
 Alert received from [employer]:  Alert remote transmission: Tachy detections. 2 Tachy episodes recorded on 09/09/23 at 3:31 and 3:33 pm, longest 46 sec, EGMs c/w NCT, avg V rates 202-215 bpm; routed to clinic for review.   Loop placed for syncope.  Attempted to contact Pt to assess for symptoms.  Call went to VM.  VM is full.

## 2023-09-12 ENCOUNTER — Ambulatory Visit (INDEPENDENT_AMBULATORY_CARE_PROVIDER_SITE_OTHER): Payer: Medicare HMO

## 2023-09-12 DIAGNOSIS — R55 Syncope and collapse: Secondary | ICD-10-CM

## 2023-09-12 LAB — CUP PACEART REMOTE DEVICE CHECK
Date Time Interrogation Session: 20250516050236
Implantable Pulse Generator Implant Date: 20240625
Pulse Gen Model: 5000
Pulse Gen Serial Number: 511029626

## 2023-09-12 NOTE — Telephone Encounter (Addendum)
 Patient called back and denied experiencing any symptoms such as SOB, palpitations, chest pain, or dizziness during time of event  Patient confirmed that he has been compliant with all of his prescribed medications Patient instructed to call device clinic if he has any symptoms previously discussed. Patient was agreeable to this. Requested that patient tell his wife to clean out her voice mailbox so we can leave messages in the future Patient's personal mobile phone number added to EHR

## 2023-09-12 NOTE — Telephone Encounter (Signed)
 Attempted to contact primary number in chart. No answer. [facility].  Spoke with secondary contact Debria Fang) who stated she would contact the patient and have them call us  back. Device clinic number provided.

## 2023-09-15 NOTE — Addendum Note (Signed)
 Addended by: Lott Rouleau A on: 09/15/2023 09:14 AM   Modules accepted: Orders

## 2023-09-15 NOTE — Progress Notes (Signed)
 [facility] Summary Report / Loop Recorder

## 2023-09-24 ENCOUNTER — Ambulatory Visit: Payer: Self-pay | Admitting: Cardiology

## 2023-09-26 ENCOUNTER — Other Ambulatory Visit: Payer: Self-pay | Admitting: Cardiology

## 2023-09-26 DIAGNOSIS — I739 Peripheral vascular disease, unspecified: Secondary | ICD-10-CM

## 2023-09-26 DIAGNOSIS — I10 Essential (primary) hypertension: Secondary | ICD-10-CM

## 2023-10-02 ENCOUNTER — Encounter (HOSPITAL_COMMUNITY): Payer: Self-pay

## 2023-10-08 ENCOUNTER — Encounter: Payer: Self-pay | Admitting: Nurse Practitioner

## 2023-10-08 ENCOUNTER — Ambulatory Visit: Attending: Cardiovascular Disease | Admitting: Nurse Practitioner

## 2023-10-08 VITALS — BP 122/70 | HR 63 | Ht 65.0 in | Wt 118.6 lb

## 2023-10-08 DIAGNOSIS — Z87898 Personal history of other specified conditions: Secondary | ICD-10-CM

## 2023-10-08 DIAGNOSIS — I10 Essential (primary) hypertension: Secondary | ICD-10-CM | POA: Diagnosis not present

## 2023-10-08 DIAGNOSIS — I2510 Atherosclerotic heart disease of native coronary artery without angina pectoris: Secondary | ICD-10-CM | POA: Diagnosis not present

## 2023-10-08 DIAGNOSIS — I739 Peripheral vascular disease, unspecified: Secondary | ICD-10-CM

## 2023-10-08 DIAGNOSIS — I480 Paroxysmal atrial fibrillation: Secondary | ICD-10-CM

## 2023-10-08 DIAGNOSIS — E039 Hypothyroidism, unspecified: Secondary | ICD-10-CM

## 2023-10-08 DIAGNOSIS — E785 Hyperlipidemia, unspecified: Secondary | ICD-10-CM
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
## 2023-10-08 NOTE — Patient Instructions (Signed)
 Medication Instructions:  Your physician recommends that you continue on your current medications as directed. Please refer to the Current Medication list given to you today.  *If you need a refill on your cardiac medications before your next appointment, please call your pharmacy*  Lab Work: NONE ordered at this time of appointment   Testing/Procedures: NONE ordered at this time of appointment   Follow-Up: At [facility], you and your health needs are our priority.  As part of our continuing mission to provide you with exceptional heart care, our providers are all part of one team.  This team includes your primary Cardiologist (physician) and Advanced Practice Providers or APPs (Physician Assistants and Nurse Practitioners) who all work together to provide you with the care you need, when you need it.  Your next appointment:   6 month(s)  Provider:   Knox Perl, MD    We recommend signing up for the patient portal called MyChart.  Sign up information is provided on this After Visit Summary.  MyChart is used to connect with patients for Virtual Visits (Telemedicine).  Patients are able to view lab/test results, encounter notes, upcoming appointments, etc.  Non-urgent messages can be sent to your provider as well.   To learn more about what you can do with MyChart, go to [email or web address].

## 2023-10-10 ENCOUNTER — Encounter: Payer: Self-pay | Admitting: Nurse Practitioner

## 2023-10-13 ENCOUNTER — Ambulatory Visit

## 2023-10-13 DIAGNOSIS — Z87898 Personal history of other specified conditions: Secondary | ICD-10-CM

## 2023-10-13 DIAGNOSIS — R55 Syncope and collapse: Secondary | ICD-10-CM | POA: Diagnosis not present

## 2023-10-13 LAB — CUP PACEART REMOTE DEVICE CHECK
Date Time Interrogation Session: 20250616050058
Implantable Pulse Generator Implant Date: 20240625
Pulse Gen Model: 5000
Pulse Gen Serial Number: 511029626

## 2023-10-16 NOTE — Progress Notes (Signed)
 [facility] Summary Report / Loop Recorder

## 2023-10-17 ENCOUNTER — Other Ambulatory Visit: Payer: Self-pay | Admitting: Thoracic Surgery (Cardiothoracic Vascular Surgery)

## 2023-10-17 DIAGNOSIS — J9859 Other diseases of mediastinum, not elsewhere classified: Secondary | ICD-10-CM

## 2023-10-19 ENCOUNTER — Ambulatory Visit: Payer: Self-pay | Admitting: Cardiology

## 2023-10-21 ENCOUNTER — Ambulatory Visit (HOSPITAL_COMMUNITY)

## 2023-10-21 ENCOUNTER — Ambulatory Visit: Admitting: Thoracic Surgery (Cardiothoracic Vascular Surgery)

## 2023-10-22 ENCOUNTER — Encounter: Payer: Self-pay | Admitting: Thoracic Surgery (Cardiothoracic Vascular Surgery)

## 2023-11-13 ENCOUNTER — Ambulatory Visit (INDEPENDENT_AMBULATORY_CARE_PROVIDER_SITE_OTHER)

## 2023-11-13 DIAGNOSIS — Z87898 Personal history of other specified conditions: Secondary | ICD-10-CM

## 2023-11-13 DIAGNOSIS — R55 Syncope and collapse: Secondary | ICD-10-CM | POA: Diagnosis not present

## 2023-11-13 LAB — CUP PACEART REMOTE DEVICE CHECK
Date Time Interrogation Session: 20250717050045
Implantable Pulse Generator Implant Date: 20240625
Pulse Gen Model: 5000
Pulse Gen Serial Number: 511029626

## 2023-11-14 ENCOUNTER — Other Ambulatory Visit: Payer: Self-pay | Admitting: Cardiology

## 2023-11-14 DIAGNOSIS — I739 Peripheral vascular disease, unspecified: Secondary | ICD-10-CM

## 2023-11-16 ENCOUNTER — Ambulatory Visit: Payer: Self-pay | Admitting: Cardiology

## 2023-11-20 NOTE — Progress Notes (Signed)
 Merlin [employer]

## 2023-11-24 ENCOUNTER — Ambulatory Visit (HOSPITAL_COMMUNITY): Admission: RE | Admit: 2023-11-24 | Source: Ambulatory Visit

## 2023-11-27 ENCOUNTER — Encounter (HOSPITAL_COMMUNITY): Payer: Self-pay

## 2023-12-12 DIAGNOSIS — E039 Hypothyroidism, unspecified: Secondary | ICD-10-CM | POA: Diagnosis not present

## 2023-12-12 DIAGNOSIS — E785 Hyperlipidemia, unspecified: Secondary | ICD-10-CM | POA: Diagnosis not present

## 2023-12-12 DIAGNOSIS — E559 Vitamin D deficiency, unspecified: Secondary | ICD-10-CM | POA: Diagnosis not present

## 2023-12-12 DIAGNOSIS — R630 Anorexia: Secondary | ICD-10-CM | POA: Diagnosis not present

## 2023-12-12 DIAGNOSIS — Z85238 Personal history of other malignant neoplasm of thymus: Secondary | ICD-10-CM | POA: Diagnosis not present

## 2023-12-14 ENCOUNTER — Ambulatory Visit (INDEPENDENT_AMBULATORY_CARE_PROVIDER_SITE_OTHER)

## 2023-12-14 DIAGNOSIS — R55 Syncope and collapse: Secondary | ICD-10-CM | POA: Diagnosis not present

## 2023-12-16 DIAGNOSIS — F418 Other specified anxiety disorders: Secondary | ICD-10-CM | POA: Diagnosis not present

## 2023-12-16 DIAGNOSIS — E039 Hypothyroidism, unspecified: Secondary | ICD-10-CM | POA: Diagnosis not present

## 2023-12-16 DIAGNOSIS — R634 Abnormal weight loss: Secondary | ICD-10-CM | POA: Diagnosis not present

## 2023-12-16 DIAGNOSIS — Z85238 Personal history of other malignant neoplasm of thymus: Secondary | ICD-10-CM | POA: Diagnosis not present

## 2023-12-16 LAB — CUP PACEART REMOTE DEVICE CHECK
Date Time Interrogation Session: 20250817050220
Implantable Pulse Generator Implant Date: 20240625
Pulse Gen Model: 5000
Pulse Gen Serial Number: 511029626

## 2023-12-22 ENCOUNTER — Ambulatory Visit: Payer: Self-pay | Admitting: Cardiology

## 2024-01-14 ENCOUNTER — Encounter

## 2024-01-14 ENCOUNTER — Ambulatory Visit (INDEPENDENT_AMBULATORY_CARE_PROVIDER_SITE_OTHER)

## 2024-01-14 DIAGNOSIS — R55 Syncope and collapse: Secondary | ICD-10-CM | POA: Diagnosis not present

## 2024-01-15 LAB — CUP PACEART REMOTE DEVICE CHECK
Date Time Interrogation Session: 20250917050359
Implantable Pulse Generator Implant Date: 20240625
Pulse Gen Model: 5000
Pulse Gen Serial Number: 511029626

## 2024-01-18 ENCOUNTER — Ambulatory Visit: Payer: Self-pay | Admitting: Cardiology

## 2024-01-20 NOTE — Progress Notes (Signed)
 Remote Loop Recorder Transmission

## 2024-01-21 NOTE — Progress Notes (Signed)
 Remote Loop Recorder Transmission

## 2024-02-03 NOTE — Progress Notes (Signed)
 Remote Loop Recorder Transmission

## 2024-02-14 ENCOUNTER — Encounter

## 2024-02-16 ENCOUNTER — Encounter

## 2024-02-22 ENCOUNTER — Encounter (HOSPITAL_COMMUNITY): Payer: Self-pay | Admitting: Internal Medicine

## 2024-02-22 ENCOUNTER — Other Ambulatory Visit: Payer: Self-pay

## 2024-02-22 ENCOUNTER — Emergency Department (HOSPITAL_COMMUNITY)

## 2024-02-22 ENCOUNTER — Inpatient Hospital Stay (HOSPITAL_COMMUNITY)
Admission: EM | Admit: 2024-02-22 | Discharge: 2024-02-26 | DRG: 682 | Disposition: A | Discharge status: Home / Self Care | Attending: Internal Medicine | Admitting: Internal Medicine

## 2024-02-22 DIAGNOSIS — R404 Transient alteration of awareness: Principal | ICD-10-CM

## 2024-02-22 DIAGNOSIS — I6782 Cerebral ischemia: Secondary | ICD-10-CM | POA: Diagnosis not present

## 2024-02-22 DIAGNOSIS — E785 Hyperlipidemia, unspecified: Secondary | ICD-10-CM | POA: Diagnosis present

## 2024-02-22 DIAGNOSIS — J449 Chronic obstructive pulmonary disease, unspecified: Secondary | ICD-10-CM | POA: Diagnosis present

## 2024-02-22 DIAGNOSIS — J984 Other disorders of lung: Secondary | ICD-10-CM | POA: Diagnosis not present

## 2024-02-22 DIAGNOSIS — E876 Hypokalemia: Secondary | ICD-10-CM | POA: Diagnosis present

## 2024-02-22 DIAGNOSIS — Z79890 Hormone replacement therapy (postmenopausal): Secondary | ICD-10-CM | POA: Diagnosis not present

## 2024-02-22 DIAGNOSIS — E43 Unspecified severe protein-calorie malnutrition: Secondary | ICD-10-CM | POA: Diagnosis present

## 2024-02-22 DIAGNOSIS — Z681 Body mass index (BMI) 19 or less, adult: Secondary | ICD-10-CM | POA: Diagnosis not present

## 2024-02-22 DIAGNOSIS — I959 Hypotension, unspecified: Secondary | ICD-10-CM | POA: Diagnosis present

## 2024-02-22 DIAGNOSIS — I480 Paroxysmal atrial fibrillation: Secondary | ICD-10-CM | POA: Diagnosis not present

## 2024-02-22 DIAGNOSIS — G9341 Metabolic encephalopathy: Secondary | ICD-10-CM | POA: Diagnosis not present

## 2024-02-22 DIAGNOSIS — I2510 Atherosclerotic heart disease of native coronary artery without angina pectoris: Secondary | ICD-10-CM | POA: Diagnosis present

## 2024-02-22 DIAGNOSIS — G8929 Other chronic pain: Secondary | ICD-10-CM | POA: Diagnosis present

## 2024-02-22 DIAGNOSIS — R062 Wheezing: Secondary | ICD-10-CM | POA: Diagnosis not present

## 2024-02-22 DIAGNOSIS — E860 Dehydration: Secondary | ICD-10-CM | POA: Diagnosis not present

## 2024-02-22 DIAGNOSIS — E538 Deficiency of other specified B group vitamins: Secondary | ICD-10-CM | POA: Diagnosis present

## 2024-02-22 DIAGNOSIS — J432 Centrilobular emphysema: Secondary | ICD-10-CM | POA: Diagnosis not present

## 2024-02-22 DIAGNOSIS — Z95828 Presence of other vascular implants and grafts: Secondary | ICD-10-CM

## 2024-02-22 DIAGNOSIS — M7989 Other specified soft tissue disorders: Secondary | ICD-10-CM | POA: Diagnosis not present

## 2024-02-22 DIAGNOSIS — Z630 Problems in relationship with spouse or partner: Secondary | ICD-10-CM

## 2024-02-22 DIAGNOSIS — R000 Tachycardia, unspecified: Secondary | ICD-10-CM | POA: Diagnosis not present

## 2024-02-22 DIAGNOSIS — I499 Cardiac arrhythmia, unspecified: Secondary | ICD-10-CM | POA: Diagnosis not present

## 2024-02-22 DIAGNOSIS — E039 Hypothyroidism, unspecified: Secondary | ICD-10-CM | POA: Diagnosis not present

## 2024-02-22 DIAGNOSIS — I739 Peripheral vascular disease, unspecified: Secondary | ICD-10-CM | POA: Diagnosis present

## 2024-02-22 DIAGNOSIS — R627 Adult failure to thrive: Secondary | ICD-10-CM | POA: Diagnosis present

## 2024-02-22 DIAGNOSIS — Z1152 Encounter for screening for COVID-19: Secondary | ICD-10-CM | POA: Diagnosis not present

## 2024-02-22 DIAGNOSIS — Z79899 Other long term (current) drug therapy: Secondary | ICD-10-CM

## 2024-02-22 DIAGNOSIS — A419 Sepsis, unspecified organism: Secondary | ICD-10-CM | POA: Diagnosis not present

## 2024-02-22 DIAGNOSIS — Z7902 Long term (current) use of antithrombotics/antiplatelets: Secondary | ICD-10-CM | POA: Diagnosis not present

## 2024-02-22 DIAGNOSIS — M79672 Pain in left foot: Secondary | ICD-10-CM | POA: Diagnosis not present

## 2024-02-22 DIAGNOSIS — F0393 Unspecified dementia, unspecified severity, with mood disturbance: Secondary | ICD-10-CM | POA: Diagnosis not present

## 2024-02-22 DIAGNOSIS — M85872 Other specified disorders of bone density and structure, left ankle and foot: Secondary | ICD-10-CM | POA: Diagnosis not present

## 2024-02-22 DIAGNOSIS — F32A Depression, unspecified: Secondary | ICD-10-CM | POA: Diagnosis present

## 2024-02-22 DIAGNOSIS — R55 Syncope and collapse: Secondary | ICD-10-CM | POA: Diagnosis not present

## 2024-02-22 DIAGNOSIS — Z87891 Personal history of nicotine dependence: Secondary | ICD-10-CM

## 2024-02-22 DIAGNOSIS — Z95818 Presence of other cardiac implants and grafts: Secondary | ICD-10-CM

## 2024-02-22 DIAGNOSIS — G319 Degenerative disease of nervous system, unspecified: Secondary | ICD-10-CM | POA: Diagnosis not present

## 2024-02-22 DIAGNOSIS — Z85238 Personal history of other malignant neoplasm of thymus: Secondary | ICD-10-CM

## 2024-02-22 DIAGNOSIS — Z9221 Personal history of antineoplastic chemotherapy: Secondary | ICD-10-CM

## 2024-02-22 DIAGNOSIS — Z8249 Family history of ischemic heart disease and other diseases of the circulatory system: Secondary | ICD-10-CM

## 2024-02-22 DIAGNOSIS — N179 Acute kidney failure, unspecified: Principal | ICD-10-CM | POA: Diagnosis present

## 2024-02-22 DIAGNOSIS — K219 Gastro-esophageal reflux disease without esophagitis: Secondary | ICD-10-CM | POA: Diagnosis present

## 2024-02-22 DIAGNOSIS — R7402 Elevation of levels of lactic acid dehydrogenase (LDH): Secondary | ICD-10-CM | POA: Diagnosis not present

## 2024-02-22 DIAGNOSIS — R918 Other nonspecific abnormal finding of lung field: Secondary | ICD-10-CM | POA: Diagnosis not present

## 2024-02-22 DIAGNOSIS — Z91148 Patient's other noncompliance with medication regimen for other reason: Secondary | ICD-10-CM

## 2024-02-22 DIAGNOSIS — Z8589 Personal history of malignant neoplasm of other organs and systems: Secondary | ICD-10-CM

## 2024-02-22 DIAGNOSIS — R7989 Other specified abnormal findings of blood chemistry: Secondary | ICD-10-CM

## 2024-02-22 DIAGNOSIS — I10 Essential (primary) hypertension: Secondary | ICD-10-CM | POA: Diagnosis present

## 2024-02-22 DIAGNOSIS — Z923 Personal history of irradiation: Secondary | ICD-10-CM

## 2024-02-22 DIAGNOSIS — Z85810 Personal history of malignant neoplasm of tongue: Secondary | ICD-10-CM

## 2024-02-22 DIAGNOSIS — I700 Atherosclerosis of aorta: Secondary | ICD-10-CM | POA: Diagnosis not present

## 2024-02-22 DIAGNOSIS — R4182 Altered mental status, unspecified: Secondary | ICD-10-CM | POA: Diagnosis not present

## 2024-02-22 LAB — COMPREHENSIVE METABOLIC PANEL WITH GFR
ALT: 12 U/L (ref 0–44)
AST: 24 U/L (ref 15–41)
Albumin: 3.5 g/dL (ref 3.5–5.0)
Alkaline Phosphatase: 54 U/L (ref 38–126)
Anion gap: 14 (ref 5–15)
BUN: 15 mg/dL (ref 8–23)
CO2: 22 mmol/L (ref 22–32)
Calcium: 8.7 mg/dL — ABNORMAL LOW (ref 8.9–10.3)
Chloride: 102 mmol/L (ref 98–111)
Creatinine, Ser: 1.41 mg/dL — ABNORMAL HIGH (ref 0.61–1.24)
GFR, Estimated: 53 mL/min — ABNORMAL LOW (ref >60)
Glucose, Bld: 147 mg/dL — ABNORMAL HIGH (ref 70–99)
Potassium: 3.8 mmol/L (ref 3.5–5.1)
Sodium: 138 mmol/L (ref 135–145)
Total Bilirubin: 0.9 mg/dL (ref 0.0–1.2)
Total Protein: 6.5 g/dL (ref 6.5–8.1)

## 2024-02-22 LAB — RESPIRATORY PANEL BY PCR

## 2024-02-22 LAB — RAPID URINE DRUG SCREEN, HOSP PERFORMED
Amphetamines: NOT DETECTED
Barbiturates: NOT DETECTED
Benzodiazepines: NOT DETECTED
Cocaine: NOT DETECTED
Opiates: NOT DETECTED
Tetrahydrocannabinol: POSITIVE — AB

## 2024-02-22 LAB — URINALYSIS, W/ REFLEX TO CULTURE (INFECTION SUSPECTED)
Bacteria, UA: NONE SEEN
Bilirubin Urine: NEGATIVE
Glucose, UA: NEGATIVE mg/dL
Ketones, ur: NEGATIVE mg/dL
Leukocytes,Ua: NEGATIVE
Nitrite: NEGATIVE
Protein, ur: NEGATIVE mg/dL
Specific Gravity, Urine: 1.033 — ABNORMAL HIGH (ref 1.005–1.030)
pH: 6 (ref 5.0–8.0)

## 2024-02-22 LAB — I-STAT CG4 LACTIC ACID, ED
Lactic Acid, Venous: 2.1 mmol/L (ref 0.5–1.9)
Lactic Acid, Venous: 2.9 mmol/L (ref 0.5–1.9)
Lactic Acid, Venous: 1.3 mmol/L (ref 0.5–1.9)
Lactic Acid, Venous: 0.7 mmol/L (ref 0.5–1.9)

## 2024-02-22 LAB — PROTIME-INR
INR: 1 (ref 0.8–1.2)
Prothrombin Time: 14.1 s (ref 11.4–15.2)

## 2024-02-22 LAB — CBC
HCT: 40.4 % (ref 39.0–52.0)
Hemoglobin: 13.5 g/dL (ref 13.0–17.0)
MCH: 30.9 pg (ref 26.0–34.0)
MCHC: 33.4 g/dL (ref 30.0–36.0)
MCV: 92.4 fL (ref 80.0–100.0)
Platelets: 285 K/uL (ref 150–400)
RBC: 4.37 MIL/uL (ref 4.22–5.81)
RDW: 15.6 % — ABNORMAL HIGH (ref 11.5–15.5)
WBC: 11 K/uL — ABNORMAL HIGH (ref 4.0–10.5)
nRBC: 0 % (ref 0.0–0.2)

## 2024-02-22 LAB — RESP PANEL BY RT-PCR (RSV, FLU A&B, COVID)  RVPGX2
Influenza A by PCR: NEGATIVE
Influenza B by PCR: NEGATIVE
Resp Syncytial Virus by PCR: NEGATIVE
SARS Coronavirus 2 by RT PCR: NEGATIVE

## 2024-02-22 LAB — TSH: TSH: 17.16 u[IU]/mL — ABNORMAL HIGH (ref 0.350–4.500)

## 2024-02-22 LAB — CBG MONITORING, ED: Glucose-Capillary: 156 mg/dL — ABNORMAL HIGH (ref 70–99)

## 2024-02-22 LAB — HEMOGLOBIN A1C
Hgb A1c MFr Bld: 5.3 % (ref 4.8–5.6)
Mean Plasma Glucose: 105.41 mg/dL

## 2024-02-22 LAB — T4, FREE: Free T4: 0.7 ng/dL (ref 0.61–1.12)

## 2024-02-22 MED ORDER — ROSUVASTATIN CALCIUM 20 MG PO TABS
20.0000 mg | ORAL_TABLET | Freq: Every day | ORAL | Status: DC
  Administered 2024-02-22 – 2024-02-26 (×5): 20 mg via ORAL
  Filled 2024-02-22 (×5): qty 1

## 2024-02-22 MED ORDER — VANCOMYCIN HCL 1250 MG/250ML IV SOLN
1250.0000 mg | Freq: Once | INTRAVENOUS | Status: AC
  Administered 2024-02-22: 1250 mg via INTRAVENOUS
  Filled 2024-02-22: qty 250

## 2024-02-22 MED ORDER — LACTATED RINGERS IV SOLN: INTRAVENOUS | Status: DC

## 2024-02-22 MED ORDER — VANCOMYCIN HCL IN DEXTROSE 1-5 GM/200ML-% IV SOLN: 1000.0000 mg | Freq: Once | INTRAVENOUS | Status: DC

## 2024-02-22 MED ORDER — ENOXAPARIN SODIUM 40 MG/0.4ML IJ SOSY
40.0000 mg | PREFILLED_SYRINGE | INTRAMUSCULAR | Status: DC
  Administered 2024-02-22 – 2024-02-26 (×5): 40 mg via SUBCUTANEOUS
  Filled 2024-02-22 (×6): qty 0.4

## 2024-02-22 MED ORDER — ESCITALOPRAM OXALATE 20 MG PO TABS
20.0000 mg | ORAL_TABLET | Freq: Every day | ORAL | Status: DC
  Administered 2024-02-22 – 2024-02-26 (×5): 20 mg via ORAL
  Filled 2024-02-22 (×3): qty 1
  Filled 2024-02-22: qty 2
  Filled 2024-02-22: qty 1

## 2024-02-22 MED ORDER — LEVOTHYROXINE SODIUM 100 MCG PO TABS
100.0000 ug | ORAL_TABLET | Freq: Every day | ORAL | Status: DC
  Administered 2024-02-23 – 2024-02-26 (×4): 100 ug via ORAL
  Filled 2024-02-22 (×4): qty 1

## 2024-02-22 MED ORDER — LACTATED RINGERS IV BOLUS
1000.0000 mL | Freq: Once | INTRAVENOUS | Status: AC
  Administered 2024-02-22: 1000 mL via INTRAVENOUS

## 2024-02-22 MED ORDER — CLOPIDOGREL BISULFATE 75 MG PO TABS
75.0000 mg | ORAL_TABLET | Freq: Every day | ORAL | Status: DC
Start: 2024-02-22 — End: 2024-02-26
  Administered 2024-02-22 – 2024-02-26 (×5): 75 mg via ORAL
  Filled 2024-02-22 (×5): qty 1

## 2024-02-22 MED ORDER — SODIUM CHLORIDE 0.9 % IV SOLN
2.0000 g | Freq: Once | INTRAVENOUS | Status: AC
  Administered 2024-02-22: 2 g via INTRAVENOUS
  Filled 2024-02-22: qty 12.5

## 2024-02-22 MED ORDER — METRONIDAZOLE 500 MG/100ML IV SOLN: 500.0000 mg | Freq: Once | INTRAVENOUS | Status: DC

## 2024-02-22 MED ORDER — IOHEXOL 350 MG/ML SOLN
75.0000 mL | Freq: Once | INTRAVENOUS | Status: AC | PRN
  Administered 2024-02-22: 75 mL via INTRAVENOUS

## 2024-02-22 NOTE — ED Notes (Signed)
 RN assisted pt to side of bed to eat lunch

## 2024-02-22 NOTE — ED Notes (Signed)
 Wife Angel [name] [phone] would like an update asap

## 2024-02-22 NOTE — ED Provider Notes (Addendum)
 [facility] Provider Note   CSN: [id number] Arrival date & time: 02/22/24  0126     Patient presents with: Altered Mental Status   Angel [name] is a 73 y.o. male with a hx of CAD, Paroxysmal afib, recurrent syncope s/p ILR implantation, PAD with left CIA stenting, HTN, hyperlipidema, COPD, thymic cancer s/p resection in 6/24 via VATS.  He presents today via EMS due to altered mental status at home.  Patient's wife states that he has not gotten out of bed for the last 48 hours.  GCS was 13 time of EMS arrival but BP was low with systolic of 86, warm to the touch per EMS.  Patient denies any recent symptoms aside from foot pain which he states is worsened from his baseline today making him swell from to walk.  He does state that for the last few days he feels like he has been unable to get out of bed and will fall asleep without warning, says he does not remember interacting with his wife for last 2 days.  I reviewed his medical record and not anticoagulated but he is on aspirin  and Plavix . Level 5 caveat due to acuity of presentation on arrival.    HPI     Prior to Admission medications   Medication Sig Start Date End Date Taking? Authorizing Provider  albuterol  (VENTOLIN  HFA) 108 (90 Base) MCG/ACT inhaler Inhale 2 puffs into the lungs every 6 (six) hours as needed for wheezing or shortness of breath. 03/26/23   Tobie Yetta HERO, MD  Cholecalciferol  (VITAMIN D3) 50 MCG (2000 UT) TABS Take 2,000 Units by mouth daily.    [provider]  clopidogrel  (PLAVIX ) 75 MG tablet TAKE 1 TABLET EVERY DAY 11/14/23   Ladona Heinz, MD  cyanocobalamin (VITAMIN B12) 1000 MCG tablet Take 1 tablet (1,000 mcg total) by mouth daily. 03/26/23   Tobie Yetta HERO, MD  docusate sodium  (COLACE) 100 MG capsule Take 1 capsule (100 mg total) by mouth 2 (two) times daily. 03/26/23   Tobie Yetta HERO, MD  escitalopram  (LEXAPRO ) 20 MG tablet Take 20 mg by mouth daily.  04/16/23   [provider]  ferrous sulfate  325 (65 FE) MG EC tablet Take 1 tablet (325 mg total) by mouth 2 (two) times daily. 03/26/23 03/25/24  Tobie Yetta HERO, MD  levothyroxine  (SYNTHROID ) 88 MCG tablet Take 88 mcg by mouth daily. 06/10/19   [provider]  methocarbamol  (ROBAXIN ) 500 MG tablet Take 500 mg by mouth every 6 (six) hours as needed for muscle spasms.    [provider]  metoprolol  succinate (TOPROL  XL) 25 MG 24 hr tablet Take 1 tablet (25 mg total) by mouth daily. 07/04/23   Kennyth Chew, MD  Multiple Vitamin (MULTIVITAMIN WITH MINERALS) TABS tablet Take 1 tablet by mouth daily. Centrum 50+    [provider]  pantoprazole  (PROTONIX ) 40 MG tablet Take 1 tablet (40 mg total) by mouth 2 (two) times daily before a meal. 03/26/23 04/25/23  Tobie Yetta HERO, MD  polyethylene glycol (MIRALAX  / GLYCOLAX ) 17 g packet Take 17 g by mouth daily. 03/27/23   Tobie Yetta HERO, MD  rosuvastatin  (CRESTOR ) 20 MG tablet Take 1 tablet by mouth once daily 07/23/23   Ganji, Jay, MD  traMADol  (ULTRAM ) 50 MG tablet Take 50 mg by mouth 4 (four) times daily as needed. 04/08/23   [provider]    Allergies: Patient has no known allergies.    Review  of Systems  Unable to perform ROS: Mental status change    Updated Vital Signs BP (!) 147/76   Pulse 81   Temp 98.6 F (37 C) (Oral)   Resp (!) 22   Ht 5' 5 (1.651 m)   Wt 53.8 kg   SpO2 99%   BMI 19.74 kg/m   Physical Exam Vitals and nursing note reviewed.  Constitutional:      Appearance: He is ill-appearing. He is not toxic-appearing.  HENT:     Head: Normocephalic and atraumatic.     Mouth/Throat:     Mouth: Mucous membranes are moist.     Pharynx: No oropharyngeal exudate or posterior oropharyngeal erythema.  Eyes:     General:        Right eye: No discharge.        Left eye: No discharge.     Conjunctiva/sclera: Conjunctivae normal.  Cardiovascular:     Rate and Rhythm: Regular rhythm.  Tachycardia present.     Pulses: Normal pulses.     Heart sounds: Normal heart sounds. No murmur heard.    No gallop.     Comments: Very faint but palpable Bilateral pedal pulses. Feet warm to touch, pink, with normal cap refill in all digits of the feet bilaterally.  Pulmonary:     Effort: Pulmonary effort is normal. Tachypnea present. No respiratory distress.     Breath sounds: Normal breath sounds. No wheezing or rales.  Abdominal:     General: Bowel sounds are normal. There is no distension.     Palpations: Abdomen is soft.     Tenderness: There is no abdominal tenderness. There is no guarding or rebound.  Musculoskeletal:        General: No deformity.     Cervical back: Neck supple.     Right lower leg: No edema.     Left lower leg: No edema.  Skin:    General: Skin is warm and dry.     Capillary Refill: Capillary refill takes less than 2 seconds.  Neurological:     Mental Status: He is alert and oriented to person, place, and time.  Psychiatric:        Mood and Affect: Mood normal.     (all labs ordered are listed, but only abnormal results are displayed) Labs Reviewed  COMPREHENSIVE METABOLIC PANEL WITH GFR - Abnormal; Notable for the following components:      Result Value   Glucose, Bld 147 (*)    Creatinine, Ser 1.41 (*)    Calcium  8.7 (*)    GFR, Estimated 53 (*)    All other components within normal limits  CBC - Abnormal; Notable for the following components:   WBC 11.0 (*)    RDW 15.6 (*)    All other components within normal limits  URINALYSIS, W/ REFLEX TO CULTURE (INFECTION SUSPECTED) - Abnormal; Notable for the following components:   Specific Gravity, Urine 1.033 (*)    Hgb urine dipstick SMALL (*)    All other components within normal limits  RAPID URINE DRUG SCREEN, HOSP PERFORMED - Abnormal; Notable for the following components:   Tetrahydrocannabinol POSITIVE (*)    All other components within normal limits  CBG MONITORING, ED - Abnormal; Notable  for the following components:   Glucose-Capillary 156 (*)    All other components within normal limits  I-STAT CG4 LACTIC ACID, ED - Abnormal; Notable for the following components:   Lactic Acid, Venous 2.1 (*)    All other  components within normal limits  I-STAT CG4 LACTIC ACID, ED - Abnormal; Notable for the following components:   Lactic Acid, Venous 2.9 (*)    All other components within normal limits  CULTURE, BLOOD (ROUTINE X 2)  CULTURE, BLOOD (ROUTINE X 2)  RESP PANEL BY RT-PCR (RSV, FLU A&B, COVID)  RVPGX2  PROTIME-INR  TSH    EKG: EKG Interpretation Date/Time:  Sunday February 22 2024 01:32:46 EDT Ventricular Rate:  107 PR Interval:  122 QRS Duration:  82 QT Interval:  368 QTC Calculation: 491 R Axis:   -44  Text Interpretation: Sinus tachycardia with Fusion complexes Left axis deviation ST & T wave abnormality, consider lateral ischemia Abnormal ECG No significant change since last tracing Confirmed by Emil Share [phone]) on 02/22/2024 2:57:12 AM  Radiology: CT CHEST ABDOMEN PELVIS W CONTRAST Result Date: 02/22/2024 EXAM: CT CHEST, ABDOMEN AND PELVIS WITH CONTRAST 02/22/2024 03:22:29 AM TECHNIQUE: CT of the chest, abdomen and pelvis was performed with the administration of 75 mL of iohexol  (OMNIPAQUE ) 350 MG/ML injection. Multiplanar reformatted images are provided for review. Automated exposure control, iterative reconstruction, and/or weight based adjustment of the mA/kV was utilized to reduce the radiation dose to as low as reasonably achievable. COMPARISON: 08/11/2023 CLINICAL HISTORY: Sepsis. sepsis. FINDINGS: CHEST: MEDIASTINUM AND LYMPH NODES: Heart and pericardium are unremarkable. Mild 3-vessel coronary atherosclerosis. The central airways are clear. Postprocedural changes along the anterior mediastinum related to prior thymic carcinoma resection, with stable residual complex postsurgical fluid/seroma. Mild thoracic atherosclerosis. No mediastinal, hilar or axillary  lymphadenopathy. LUNGS AND PLEURA: Mild centrilobular emphysematous changes. Mild scarring/radiation changes in the anterior left upper lobe. Mild scarring/atelectasis in the left lower lobe. No focal consolidation or pulmonary edema. No pleural effusion or pneumothorax. ABDOMEN AND PELVIS: LIVER: The liver is unremarkable. GALLBLADDER AND BILE DUCTS: Gallbladder is unremarkable. No biliary ductal dilatation. SPLEEN: No acute abnormality. PANCREAS: No acute abnormality. ADRENAL GLANDS: No acute abnormality. KIDNEYS, URETERS AND BLADDER: No stones in the kidneys or ureters. No hydronephrosis. No perinephric or periureteral stranding. Urinary bladder is unremarkable. GI AND BOWEL: Stomach demonstrates no acute abnormality. There is no bowel obstruction. Normal appendix (image 92). REPRODUCTIVE ORGANS: The prostate is unremarkable. PERITONEUM AND RETROPERITONEUM: No ascites. No free air. VASCULATURE: Aorta is normal in caliber. Atherosclerotic calcifications of the abdominal aorta and branch vessels, although patent. Occluded right SFA, chronic. ABDOMINAL AND PELVIS LYMPH NODES: No lymphadenopathy. BONES AND SOFT TISSUES: Degenerative changes of the visualized thoracolumbar spine. No acute osseous abnormality. No focal soft tissue abnormality. Post-surgical changes beneath the left anterior abdominal wall. Post-surgical changes related to prior left inguinal hernia repair. IMPRESSION: 1. No acute findings. 2. Stable postsurgical changes related to prior thymic carcinoma resection, as above. 3. Additional stable ancillary findings, as above. Electronically signed by: Pinkie Pebbles MD 02/22/2024 03:29 AM EDT RP Workstation: [id number]   DG Chest Port 1 View Result Date: 02/22/2024 EXAM: 1 VIEW XRAY OF THE CHEST 02/22/2024 02:30:00 AM COMPARISON: Comparison made to chest radiograph of April 21, 2023. CLINICAL HISTORY: Questionable sepsis - evaluate for abnormality. Encounter for Questionable sepsis - evaluate  for abnormality FINDINGS: LUNGS AND PLEURA: Left lung volume loss. Heterogeneous opacities overlying left mid lung zone, favored to represent combination of scarring and surgical staples. Surgical staples overlying left mid lung zone. No focal pulmonary opacity. No pulmonary edema. No pleural effusion. No pneumothorax. HEART AND MEDIASTINUM: Cardiac loop recorder overlying left chest wall. Aortic arch calcifications. No acute abnormality of the cardiac and mediastinal silhouettes. BONES AND SOFT  TISSUES: No acute osseous abnormality. IMPRESSION: 1. Left lung volume loss with heterogeneous opacities in the left mid lung zone, favored to represent combination of scarring and surgical staples. Electronically signed by: Dorethia Molt MD 02/22/2024 02:40 AM EDT RP Workstation: [id number]     Procedures   Medications Ordered in the ED  ceFEPIme  (MAXIPIME ) 2 g in sodium chloride  0.9 % 100 mL IVPB (has no administration in time range)  vancomycin  (VANCOCIN ) IVPB 1000 mg/200 mL premix (has no administration in time range)  lactated ringers  bolus 1,000 mL (0 mLs Intravenous Stopped 02/22/24 0408)  iohexol  (OMNIPAQUE ) 350 MG/ML injection 75 mL (75 mLs Intravenous Contrast Given 02/22/24 0323)  lactated ringers  bolus 1,000 mL (1,000 mLs Intravenous New Bag/Given 02/22/24 0548)    Clinical Course as of 02/22/24 0646  Sun Feb 22, 2024  0645 Consult to Dr. Segars, hospitalist who requests orders for [facility] cefepime  for empiric coverage and is amenable to adding this patient to be admitted as a carryover this morning.  I appreciate his collaboration of care of this patient. [RS]    Clinical Course User Index [RS] Essance Gatti, Pleasant SAUNDERS PA-C                                 Medical Decision Making 73 y/o male with AMS.   Tachypneic tachypneic on intake.  Physical exam otherwise reassuring.  The differential diagnosis for AMS is extensive and includes, but is not limited to:  Drug overdose - opioids,  alcohol , sedatives, antipsychotics, drug withdrawal, others Metabolic: hypoxia, hypoglycemia, hyperglycemia, hypercalcemia, hypernatremia, hyponatremia, uremia, hepatic encephalopathy, hypothyroidism, hyperthyroidism, vitamin B12 or thiamine deficiency, carbon monoxide poisoning, Wilson's disease, Lactic acidosis, DKA/HHOS Infectious: meningitis, encephalitis, bacteremia/sepsis, urinary tract infection, pneumonia, neurosyphilis Structural: Space-occupying lesion, (brain tumor, subdural hematoma, hydrocephalus,) Vascular: stroke, subarachnoid hemorrhage, coronary ischemia, hypertensive encephalopathy, CNS vasculitis, thrombotic thrombocytopenic purpura, disseminated intravascular coagulation, hyperviscosity Psychiatric: Schizophrenia, depression; Other: Seizure, hypothermia, heat stroke, ICU psychosis, dementia -sundowning.    Amount and/or Complexity of Data Reviewed Labs: ordered.    Details: CBC with mild leukocytosis of 11,000, CMP with AKI with creatinine of 1.4 increased from patient's baseline of 0.8.  Lactic uptrending from 2.1-2.9.  UA not convincing for infection, UDS with THC. Radiology: ordered.    Details:   Chest x-ray is without acute cardiopulmonary finding CT chest abdomen pelvis unremarkable  Risk Prescription drug management. Decision regarding hospitalization.   Unclear etiology for patient's hypersomnolence and uptrending troponin despite greater than 30 cc/kg bolus of LR.  Do feel he would benefit from mission to the hospital for further evaluation and stabilization.  Consult to hospitalist at above. Zohair voiced understanding of his medical evaluation and treatment plan. Each of their questions answered to their expressed satisfaction.   This chart was dictated using voice recognition software, Dragon. Despite the best efforts of this provider to proofread and correct errors, errors may still occur which can change documentation meaning.    Final diagnoses:   Transient alteration of awareness  Elevated lactic acid level    ED Discharge Orders     None          Bobette Pleasant SAUNDERS, PA-C 02/22/24 0639    Emil Share, DO 02/22/24 0639    Dalton Mille, Pleasant SAUNDERS, PA-C 02/22/24 0646    Emil Share, DO 02/22/24 [phone]
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
## 2024-02-22 NOTE — ED Triage Notes (Addendum)
 Pt bib ems from home. Pt has not been oob in two days. When wife tried to wake pt up and he was unarousable. Ems arrived and pt had GCS 13 bp 86/56 pt hot to touch hr 80. Pt has not had recent illness. Pt is A&OX4 now but does not recall ems being at his house. Vs at this time bp 132/76, hr 100 rr 28, 97% 8 lnc. Had atrovent/albuterol  with ems

## 2024-02-22 NOTE — ED Notes (Signed)
Pt used urinal  

## 2024-02-22 NOTE — ED Notes (Signed)
Pt's wife called and updated

## 2024-02-22 NOTE — ED Notes (Signed)
 RN spoke with pt wife. Pt participated in the conversation.

## 2024-02-22 NOTE — ED Notes (Signed)
Pt's wife updated on pt status  

## 2024-02-22 NOTE — ED Notes (Signed)
 CCMD called.

## 2024-02-23 ENCOUNTER — Inpatient Hospital Stay (HOSPITAL_COMMUNITY)

## 2024-02-23 DIAGNOSIS — I6782 Cerebral ischemia: Secondary | ICD-10-CM | POA: Diagnosis not present

## 2024-02-23 DIAGNOSIS — G319 Degenerative disease of nervous system, unspecified: Secondary | ICD-10-CM | POA: Diagnosis not present

## 2024-02-23 DIAGNOSIS — R404 Transient alteration of awareness: Secondary | ICD-10-CM

## 2024-02-23 DIAGNOSIS — R7989 Other specified abnormal findings of blood chemistry: Secondary | ICD-10-CM

## 2024-02-23 DIAGNOSIS — R627 Adult failure to thrive: Secondary | ICD-10-CM

## 2024-02-23 DIAGNOSIS — N179 Acute kidney failure, unspecified: Secondary | ICD-10-CM | POA: Diagnosis not present

## 2024-02-23 DIAGNOSIS — R4182 Altered mental status, unspecified: Secondary | ICD-10-CM | POA: Diagnosis not present

## 2024-02-23 LAB — CBC
HCT: 35.5 % — ABNORMAL LOW (ref 39.0–52.0)
Hemoglobin: 11.8 g/dL — ABNORMAL LOW (ref 13.0–17.0)
MCH: 30.6 pg (ref 26.0–34.0)
MCHC: 33.2 g/dL (ref 30.0–36.0)
MCV: 92 fL (ref 80.0–100.0)
Platelets: 283 K/uL (ref 150–400)
RBC: 3.86 MIL/uL — ABNORMAL LOW (ref 4.22–5.81)
RDW: 15.8 % — ABNORMAL HIGH (ref 11.5–15.5)
WBC: 7.8 K/uL (ref 4.0–10.5)
nRBC: 0 % (ref 0.0–0.2)

## 2024-02-23 LAB — VITAMIN B12: Vitamin B-12: 290 pg/mL (ref 180–914)

## 2024-02-23 LAB — BASIC METABOLIC PANEL WITH GFR
Anion gap: 14 (ref 5–15)
BUN: 9 mg/dL (ref 8–23)
CO2: 24 mmol/L (ref 22–32)
Calcium: 8.9 mg/dL (ref 8.9–10.3)
Chloride: 102 mmol/L (ref 98–111)
Creatinine, Ser: 1.05 mg/dL (ref 0.61–1.24)
GFR, Estimated: >60 mL/min (ref >60)
Glucose, Bld: 98 mg/dL (ref 70–99)
Potassium: 3.9 mmol/L (ref 3.5–5.1)
Sodium: 140 mmol/L (ref 135–145)

## 2024-02-23 LAB — T3: T3, Total: 65 ng/dL — ABNORMAL LOW (ref 71–180)

## 2024-02-23 LAB — AMMONIA: Ammonia: 18 umol/L (ref 9–35)

## 2024-02-23 MED ORDER — ACETAMINOPHEN 325 MG PO TABS
650.0000 mg | ORAL_TABLET | Freq: Four times a day (QID) | ORAL | Status: DC | PRN
  Administered 2024-02-23 – 2024-02-25 (×3): 650 mg via ORAL
  Filled 2024-02-23 (×3): qty 2

## 2024-02-23 MED ORDER — ENSURE PLUS HIGH PROTEIN PO LIQD
237.0000 mL | Freq: Two times a day (BID) | ORAL | Status: DC
  Administered 2024-02-23 – 2024-02-25 (×5): 237 mL via ORAL

## 2024-02-23 MED ORDER — ONDANSETRON HCL 4 MG/2ML IJ SOLN
4.0000 mg | Freq: Four times a day (QID) | INTRAMUSCULAR | Status: DC | PRN
  Administered 2024-02-25: 4 mg via INTRAVENOUS
  Filled 2024-02-23: qty 2

## 2024-02-23 MED ORDER — CYANOCOBALAMIN 1000 MCG/ML IJ SOLN
1000.0000 ug | Freq: Every day | INTRAMUSCULAR | Status: DC
Start: 2024-02-23 — End: 2024-03-01
  Administered 2024-02-23 – 2024-02-25 (×3): 1000 ug via SUBCUTANEOUS
  Filled 2024-02-23 (×3): qty 1

## 2024-02-23 MED ORDER — IPRATROPIUM-ALBUTEROL 0.5-2.5 (3) MG/3ML IN SOLN: 3.0000 mL | Freq: Four times a day (QID) | RESPIRATORY_TRACT | Status: DC | PRN

## 2024-02-23 MED ORDER — TRAZODONE HCL 50 MG PO TABS
50.0000 mg | ORAL_TABLET | Freq: Every day | ORAL | Status: DC
  Administered 2024-02-23 – 2024-02-25 (×3): 50 mg via ORAL
  Filled 2024-02-23 (×3): qty 1

## 2024-02-23 NOTE — Evaluation (Addendum)
 Occupational Therapy Evaluation Patient Details Name: Angel [name] MRN: [id number] DOB: [birth year] Today's Date: 02/23/2024   History of Present Illness   73 y.o.  male who presented with failure to thrive/confusion for at least 2-3 days prior to this hospitalization-on day of hospitalization- he had decreased consciousness-and was difficult to arouse, He was subsequently brought to the hospital-where he was found to have AKI.PMH of tongue cancer-s/p left neck dissection chemo/radiation in 2015-currently in remission-recent thymic cancer-s/p thymectomy in 2024-PAD, CAD, brief episode of PAF (not on anticoagulation) COPD-     Clinical Impressions Pt resting in bed comfortably, no complaints. Pt reports he lives with wife who is available 24/7, wife can assist with light ADLs, does cooking/cleaning. PLOF Pt mod I, helps with some cooking/cleaning. Pt currently requires min A for LB ADLs, CGA for mobility and verbal cues for RW placement. Pt had no overt LOB. Pt had loose stools and required increased time and min A for bathing due to all the lines/leads, but likely can manage with set up/supervision without hospital equipment and IV in the way. Pt would benefit from postacute rehab <3hrs/day to maximize safety with mobility and independence with ADLs, Pt reports they have all DME needed at home to remain safe/independent, will continue to follow acutely to progress as able.      If plan is discharge home, recommend the following:   A little help with walking and/or transfers;A little help with bathing/dressing/bathroom;Assistance with cooking/housework;Assist for transportation;Help with stairs or ramp for entrance     Functional Status Assessment   Patient has had a recent decline in their functional status and demonstrates the ability to make significant improvements in function in a reasonable and predictable amount of time.     Equipment Recommendations   None recommended by OT      Recommendations for Other Services         Precautions/Restrictions   Precautions Precautions: Fall Recall of Precautions/Restrictions: Intact Restrictions Weight Bearing Restrictions Per Provider Order: No     Mobility Bed Mobility Overal bed mobility: Modified Independent                  Transfers Overall transfer level: Needs assistance Equipment used: Rolling walker (2 wheels) Transfers: Sit to/from Stand, Bed to chair/wheelchair/BSC Sit to Stand: Contact guard assist     Step pivot transfers: Contact guard assist     General transfer comment: CGA for safety, Pt pushes RW far ahead of him, does not keep it close despite verbal cues      Balance Overall balance assessment: Needs assistance Sitting-balance support: No upper extremity supported, Feet supported Sitting balance-Leahy Scale: Fair     Standing balance support: Single extremity supported, During functional activity Standing balance-Leahy Scale: Fair Standing balance comment: able to stand unsupported, requires assistance for dynamic balance                           ADL either performed or assessed with clinical judgement   ADL Overall ADL's : Needs assistance/impaired Eating/Feeding: Independent   Grooming: Contact guard assist;Standing   Upper Body Bathing: Set up;Sitting   Lower Body Bathing: Minimal assistance;Sitting/lateral leans;Sit to/from stand   Upper Body Dressing : Set up;Sitting   Lower Body Dressing: Minimal assistance;Sitting/lateral leans;Sit to/from stand   Toilet Transfer: Contact guard assist;Rolling walker (2 wheels)   Toileting- Clothing Manipulation and Hygiene: Minimal assistance;Sitting/lateral lean;Sit to/from stand       Functional mobility during  ADLs: Contact guard assist;Rolling walker (2 wheels) General ADL Comments: min A for LB ADLs and cleaning loose stools, overall doing well. set up/CGA for UB ADLs and mobilty with RW      Vision Baseline Vision/History: 1 Wears glasses Ability to See in Adequate Light: 0 Adequate Patient Visual Report: No change from baseline       Perception         Praxis         Pertinent Vitals/Pain Pain Assessment Pain Assessment: No/denies pain     Extremity/Trunk Assessment Upper Extremity Assessment Upper Extremity Assessment: Defer to OT evaluation   Lower Extremity Assessment Lower Extremity Assessment: Generalized weakness   Cervical / Trunk Assessment Cervical / Trunk Assessment: Kyphotic   Communication Communication Communication: No apparent difficulties   Cognition Arousal: Alert Behavior During Therapy: WFL for tasks assessed/performed Cognition: No family/caregiver present to determine baseline             OT - Cognition Comments: grossly WFLs, A/O x4, at times had difficulty using RW safely                 Following commands: Impaired Following commands impaired: Follows one step commands inconsistently     Cueing  General Comments   Cueing Techniques: Verbal cues      Exercises     Shoulder Instructions      Home Living Family/patient expects to be discharged to:: Private residence Living Arrangements: Spouse/significant other Available Help at Discharge: Family;Available 24 hours/day Type of Home: House Home Access: Stairs to enter [employer] of Steps: 4 Entrance Stairs-Rails: Left Home Layout: One level     Bathroom Shower/Tub: Tub/shower unit         Home Equipment: Agricultural Consultant (2 wheels);BSC/3in1;Wheelchair - manual;Shower seat   Additional Comments: Pt reports he lives with wife      Prior Functioning/Environment Prior Level of Function : Independent/Modified Independent             Mobility Comments: He was independent with ambulation. ADLs Comments: He was independent with ADLs and he shared cooking and cleaning with spouse.    OT Problem List: Decreased strength;Decreased  activity tolerance;Impaired balance (sitting and/or standing)   OT Treatment/Interventions: Self-care/ADL training;Therapeutic exercise;Energy conservation;DME and/or AE instruction;Therapeutic activities;Patient/family education;Balance training      OT Goals(Current goals can be found in the care plan section)   Acute Rehab OT Goals Patient Stated Goal: to return home OT Goal Formulation: With patient Time For Goal Achievement: 03/08/24 Potential to Achieve Goals: Good ADL Goals Pt Will Perform Upper Body Dressing: with modified independence Pt Will Perform Lower Body Dressing: with modified independence Pt Will Transfer to Toilet: with modified independence Pt Will Perform Toileting - Clothing Manipulation and hygiene: with modified independence   OT Frequency:  Min 2X/week    Co-evaluation              AM-PAC OT 6 Clicks Daily Activity     Outcome Measure Help from another person eating meals?: None Help from another person taking care of personal grooming?: A Little Help from another person toileting, which includes using toliet, bedpan, or urinal?: A Little Help from another person bathing (including washing, rinsing, drying)?: A Little Help from another person to put on and taking off regular upper body clothing?: A Little Help from another person to put on and taking off regular lower body clothing?: A Little 6 Click Score: 19   End of Session Equipment Utilized During Treatment: Gait  belt;Rolling walker (2 wheels) Nurse Communication: Mobility status  Activity Tolerance: Patient tolerated treatment well Patient left: in bed;with call bell/phone within reach;with bed alarm set  OT Visit Diagnosis: Unsteadiness on feet (R26.81);Other abnormalities of gait and mobility (R26.89);Muscle weakness (generalized) (M62.81)                Time: 8859-8773 OT Time Calculation (min): 46 min Charges:  OT General Charges $OT Visit: 1 Visit OT Evaluation $OT Eval  Moderate Complexity: 1 Mod OT Treatments $Self Care/Home Management : 23-37 mins  [address], OTR/L   Angel [name] 02/23/2024, 3:11 PM

## 2024-02-23 NOTE — Progress Notes (Signed)
 PROGRESS NOTE        PATIENT DETAILS Name: Angel [name] Age: 73 y.o. Sex: male Date of Birth: [birth year] Admit Date: 02/22/2024 Admitting Physician Dorn Dawson, MD ERE:Ujuz, Izetta, NP  Brief Summary: Patient is a 73 y.o.  male with history of tongue cancer-s/p left neck dissection chemo/radiation in 2015-currently in remission-recent thymic cancer-s/p thymectomy in 2024-PAD, CAD, brief episode of PAF (not on anticoagulation) COPD-who presented with failure to thrive/confusion for at least 2-3 days prior to this hospitalization-on day of hospitalization- he had decreased consciousness-and was difficult to arouse.  He was subsequently brought to the hospital-where he was found to have AKI-and subsequently admitted to the hospitalist service.  Significant events: 10/26>> admit to [facility].  Significant studies: 10/26>> CT chest/abdomen/pelvis: No acute findings.  Significant microbiology data: 10/26>> COVID/influenza/RSV PCR: Negative 10/26>> respiratory virus panel: Negative 10/26>> blood culture: No growth  Procedures: None  Consults: None  Subjective: Waking up while I walked in-did not know he was in the hospital-does not know if he lives alone or who does he lives with.  After repeated questioning-he still does not recollect why he is in the hospital or who brought him here.  He does acknowledge that he has been lying in bed for the past several days with very poor appetite.  No family at bedside-but spoke with his spouse over the phone-apparently for the past 2-3 days-he has not been eating or drinking-and is lying in bed.  No fever, nausea, vomiting or diarrhea.  She is not sure if she has been taking his medications as prescribed.  Apparently they have a lot of marital stress and have not been getting along-she is inquiring whether this could be causing some of his presenting symptoms.  Objective: Vitals: Blood pressure 118/76, pulse 70, temperature  98.1 F (36.7 C), temperature source Axillary, resp. rate 20, height 5' 5 (1.651 m), weight 53.8 kg, SpO2 92%.   Exam: Gen Exam:Alert awake-not in any distress HEENT:atraumatic, normocephalic Chest: B/L clear to auscultation anteriorly CVS:S1S2 regular Abdomen:soft non tender, non distended Extremities:no edema Neurology: Non focal Skin: no rash  Pertinent Labs/Radiology:    Latest Ref Rng & Units 02/23/2024    3:23 AM 02/22/2024    1:47 AM 08/11/2023   10:30 AM  CBC  WBC 4.0 - 10.5 K/uL 7.8  11.0  5.7   Hemoglobin 13.0 - 17.0 g/dL 88.1  86.4  86.2   Hematocrit 39.0 - 52.0 % 35.5  40.4  42.9   Platelets 150 - 400 K/uL 283  285  333     Lab Results  Component Value Date   NA 140 02/23/2024   K 3.9 02/23/2024   CL 102 02/23/2024   CO2 24 02/23/2024      Assessment/Plan: Acute metabolic encephalopathy Probably multifactorial-dehydration/failure to thrive syndrome playing a major role-unclear if there is some psychogenic component (marital stress at home) Although awake-answer some questions appropriately-still confused (at baseline no cognitive issues per spouse) Continue to treat dehydration with IV fluids Although I do not suspect any organic CNS issues-Will check MRI brain given his history of malignancy.  Check B12/B1/ammonia levels as well.   Hypotension BP in the 80s systolic when EMS presented at home Etiology likely secondary to dehydration No indication of infection or sepsis physiology so far (ruled out) BP has now stabilized with IVF. Continue to monitor off  antibiotics Continue to follow cultures.  Failure to thrive syndrome/dehydration Unclear etiology-significant marital stressors at home (per spouse-they have not been getting along for a while)-suspect some element of hypothyroidism due to noncompliance to Synthroid  (spouse not sure if he is taking his medications as prescribed)  Oral intake remains poor here Resume IVF Continue Synthroid  Mobilize  with PT/OT Nutrition evaluation AM cortisol with am labs  AKI Likely hemodynamically mediated Resolved  Depression Per spouse-significant stressors at home-she suspects noncompliance with Lexapro -and that patient's depression may be playing a role in his symptoms For now-continue with Lexapro -resume trazodone-await further workup-to rule out organic causes before involving psychiatry.  Hypothyroidism TSH elevated-some suspicion for noncompliance-see discussion above Continue Synthroid  Recheck TSH in 4 to 6 weeks  Nonobstructive CAD No anginal symptoms Continue Plavix /statin  PAD-s/p PCI to left CIA August 2024 Poor historian but this appears stable Continue Plavix /Crestor .  HTN BP stable Metoprolol  on hold-resume when able  HLD Crestor   COPD Stable-not in exacerbation As needed bronchodilators  History of thymic carcinoma-s/p resection/adjuvant radiation 2024 History of stage IV tongue cancer-s/p chemoradiation in 2015 Apparently in remission Follows with oncology.  Chronic diffuse pancreatic duct dilatation On outpatient surveillance imaging per oncology.   Code status:   Code Status: Full Code   DVT Prophylaxis: enoxaparin  (LOVENOX ) injection 40 mg Start: 02/22/24 0900   Family Communication: Larenz Frasier [phone] updated over the phone on 10/27   Disposition Plan: Status is: Inpatient Remains inpatient appropriate because: Severity of illness   Planned Discharge Destination:Skilled nursing facility   Diet: Diet Order             Diet regular Room service appropriate? Yes; Fluid consistency: Thin  Diet effective now                     Antimicrobial agents: Anti-infectives (From admission, onward)    Start     Dose/Rate Route Frequency Ordered Stop   02/22/24 0700  ceFEPIme  (MAXIPIME ) 2 g in sodium chloride  0.9 % 100 mL IVPB        2 g 200 mL/hr over 30 Minutes Intravenous  Once 02/22/24 0645 02/22/24 0730   02/22/24 0700   metroNIDAZOLE  (FLAGYL ) IVPB 500 mg  Status:  Discontinued        500 mg 100 mL/hr over 60 Minutes Intravenous  Once 02/22/24 0645 02/22/24 0645   02/22/24 0700  vancomycin  (VANCOCIN ) IVPB 1000 mg/200 mL premix  Status:  Discontinued        1,000 mg 200 mL/hr over 60 Minutes Intravenous  Once 02/22/24 0645 02/22/24 0646   02/22/24 0700  vancomycin  (VANCOREADY) IVPB 1250 mg/250 mL        1,250 mg 166.7 mL/hr over 90 Minutes Intravenous  Once 02/22/24 0646 02/22/24 0904        MEDICATIONS: Scheduled Meds:  clopidogrel   75 mg Oral Daily   enoxaparin  (LOVENOX ) injection  40 mg Subcutaneous Q24H   escitalopram   20 mg Oral Daily   levothyroxine   100 mcg Oral Q0600   rosuvastatin   20 mg Oral Daily   Continuous Infusions:  lactated ringers  100 mL/hr at 02/22/24 1601   PRN Meds:.acetaminophen    I have personally reviewed following labs and imaging studies  LABORATORY DATA: CBC: Recent Labs  Lab 02/22/24 0147 02/23/24 0323  WBC 11.0* 7.8  HGB 13.5 11.8*  HCT 40.4 35.5*  MCV 92.4 92.0  PLT 285 283    Basic Metabolic Panel: Recent Labs  Lab 02/22/24 0147 02/23/24 0323  NA  138 140  K 3.8 3.9  CL 102 102  CO2 22 24  GLUCOSE 147* 98  BUN 15 9  CREATININE 1.41* 1.05  CALCIUM  8.7* 8.9    GFR: Estimated Creatinine Clearance: 49.1 mL/min (by C-G formula based on SCr of 1.05 mg/dL).  Liver Function Tests: Recent Labs  Lab 02/22/24 0147  AST 24  ALT 12  ALKPHOS 54  BILITOT 0.9  PROT 6.5  ALBUMIN  3.5   No results for input(s): LIPASE, AMYLASE in the last 168 hours. No results for input(s): AMMONIA in the last 168 hours.  Coagulation Profile: Recent Labs  Lab 02/22/24 0221  INR 1.0    Cardiac Enzymes: No results for input(s): CKTOTAL, CKMB, CKMBINDEX, TROPONINI in the last 168 hours.  BNP (last 3 results) No results for input(s): PROBNP in the last 8760 hours.  Lipid Profile: No results for input(s): CHOL, HDL, LDLCALC, TRIG,  CHOLHDL, LDLDIRECT in the last 72 hours.  Thyroid  Function Tests: Recent Labs    02/22/24 0147 02/22/24 1322  TSH 17.160*  --   FREET4  --  0.70    Anemia Panel: No results for input(s): VITAMINB12, FOLATE, FERRITIN, TIBC, IRON, RETICCTPCT in the last 72 hours.  Urine analysis:    Component Value Date/Time   COLORURINE YELLOW 02/22/2024 0549   APPEARANCEUR CLEAR 02/22/2024 0549   LABSPEC 1.033 (H) 02/22/2024 0549   PHURINE 6.0 02/22/2024 0549   GLUCOSEU NEGATIVE 02/22/2024 0549   HGBUR SMALL (A) 02/22/2024 0549   BILIRUBINUR NEGATIVE 02/22/2024 0549   KETONESUR NEGATIVE 02/22/2024 0549   PROTEINUR NEGATIVE 02/22/2024 0549   UROBILINOGEN 0.2 11/03/2013 2303   NITRITE NEGATIVE 02/22/2024 0549   LEUKOCYTESUR NEGATIVE 02/22/2024 0549    Sepsis Labs: Lactic Acid, Venous    Component Value Date/Time   LATICACIDVEN 0.7 02/22/2024 1633    MICROBIOLOGY: Recent Results (from the past 240 hours)  Blood Culture (routine x 2)     Status: None (Preliminary result)   Collection Time: 02/22/24  2:09 AM   Specimen: BLOOD  Result Value Ref Range Status   Specimen Description BLOOD RIGHT ANTECUBITAL  Final   Special Requests   Final    BOTTLES DRAWN AEROBIC AND ANAEROBIC Blood Culture adequate volume   Culture   Final    NO GROWTH < 24 HOURS Performed at [facility], [address] [place], [place] [address]    Report Status PENDING  Incomplete  Blood Culture (routine x 2)     Status: None (Preliminary result)   Collection Time: 02/22/24  2:21 AM   Specimen: BLOOD LEFT ARM  Result Value Ref Range Status   Specimen Description BLOOD LEFT ARM  Final   Special Requests   Final    BOTTLES DRAWN AEROBIC AND ANAEROBIC Blood Culture adequate volume   Culture   Final    NO GROWTH 1 DAY Performed at [facility], [address] [place], [place] [address]    Report Status PENDING  Incomplete  Resp panel by RT-PCR (RSV, Flu A&B, Covid) Anterior Nasal  Swab     Status: None   Collection Time: 02/22/24  5:50 AM   Specimen: Anterior Nasal Swab  Result Value Ref Range Status   SARS Coronavirus 2 by RT PCR NEGATIVE NEGATIVE Final   Influenza A by PCR NEGATIVE NEGATIVE Final   Influenza B by PCR NEGATIVE NEGATIVE Final    Comment: (NOTE) The Xpert Xpress SARS-CoV-2/FLU/RSV plus assay is intended as an aid in the diagnosis  of influenza from Nasopharyngeal swab specimens and should not be used as a sole basis for treatment. Nasal washings and aspirates are unacceptable for Xpert Xpress SARS-CoV-2/FLU/RSV testing.  Fact Sheet for Patients: bloggercourse.com  Fact Sheet for Healthcare Providers: seriousbroker.it  This test is not yet approved or cleared by the [place]  FDA and has been authorized for detection and/or diagnosis of SARS-CoV-2 by FDA under an Emergency Use Authorization (EUA). This EUA will remain in effect (meaning this test can be used) for the duration of the COVID-19 declaration under Section 564(b)(1) of the Act, 21 U.S.C. section 360bbb-3(b)(1), unless the authorization is terminated or revoked.     Resp Syncytial Virus by PCR NEGATIVE NEGATIVE Final    Comment: (NOTE) Fact Sheet for Patients: bloggercourse.com  Fact Sheet for Healthcare Providers: seriousbroker.it  This test is not yet approved or cleared by the [place]  FDA and has been authorized for detection and/or diagnosis of SARS-CoV-2 by FDA under an Emergency Use Authorization (EUA). This EUA will remain in effect (meaning this test can be used) for the duration of the COVID-19 declaration under Section 564(b)(1) of the Act, 21 U.S.C. section 360bbb-3(b)(1), unless the authorization is terminated or revoked.  Performed at [facility], [address] [place], [place] [address]   Respiratory (~20 pathogens) panel by PCR     Status:  None   Collection Time: 02/22/24 12:45 PM   Specimen: Nasopharyngeal Swab; Respiratory  Result Value Ref Range Status   Adenovirus NOT DETECTED NOT DETECTED Final   Coronavirus 229E NOT DETECTED NOT DETECTED Final    Comment: (NOTE) The Coronavirus on the Respiratory Panel, DOES NOT test for the novel  Coronavirus (2019 nCoV)    Coronavirus HKU1 NOT DETECTED NOT DETECTED Final   Coronavirus NL63 NOT DETECTED NOT DETECTED Final   Coronavirus OC43 NOT DETECTED NOT DETECTED Final   Metapneumovirus NOT DETECTED NOT DETECTED Final   Rhinovirus / Enterovirus NOT DETECTED NOT DETECTED Final   Influenza A NOT DETECTED NOT DETECTED Final   Influenza B NOT DETECTED NOT DETECTED Final   Parainfluenza Virus 1 NOT DETECTED NOT DETECTED Final   Parainfluenza Virus 2 NOT DETECTED NOT DETECTED Final   Parainfluenza Virus 3 NOT DETECTED NOT DETECTED Final   Parainfluenza Virus 4 NOT DETECTED NOT DETECTED Final   Respiratory Syncytial Virus NOT DETECTED NOT DETECTED Final   Bordetella pertussis NOT DETECTED NOT DETECTED Final   Bordetella Parapertussis NOT DETECTED NOT DETECTED Final   Chlamydophila pneumoniae NOT DETECTED NOT DETECTED Final   Mycoplasma pneumoniae NOT DETECTED NOT DETECTED Final    Comment: Performed at [facility], [address], [place], [place] [address]    RADIOLOGY STUDIES/RESULTS: CT CHEST ABDOMEN PELVIS W CONTRAST Result Date: 02/22/2024 EXAM: CT CHEST, ABDOMEN AND PELVIS WITH CONTRAST 02/22/2024 03:22:29 AM TECHNIQUE: CT of the chest, abdomen and pelvis was performed with the administration of 75 mL of iohexol  (OMNIPAQUE ) 350 MG/ML injection. Multiplanar reformatted images are provided for review. Automated exposure control, iterative reconstruction, and/or weight based adjustment of the mA/kV was utilized to reduce the radiation dose to as low as reasonably achievable. COMPARISON: 08/11/2023 CLINICAL HISTORY: Sepsis. sepsis. FINDINGS: CHEST: MEDIASTINUM AND LYMPH  NODES: Heart and pericardium are unremarkable. Mild 3-vessel coronary atherosclerosis. The central airways are clear. Postprocedural changes along the anterior mediastinum related to prior thymic carcinoma resection, with stable residual complex postsurgical fluid/seroma. Mild thoracic atherosclerosis. No mediastinal, hilar or axillary lymphadenopathy. LUNGS AND PLEURA: Mild centrilobular emphysematous changes. Mild  scarring/radiation changes in the anterior left upper lobe. Mild scarring/atelectasis in the left lower lobe. No focal consolidation or pulmonary edema. No pleural effusion or pneumothorax. ABDOMEN AND PELVIS: LIVER: The liver is unremarkable. GALLBLADDER AND BILE DUCTS: Gallbladder is unremarkable. No biliary ductal dilatation. SPLEEN: No acute abnormality. PANCREAS: No acute abnormality. ADRENAL GLANDS: No acute abnormality. KIDNEYS, URETERS AND BLADDER: No stones in the kidneys or ureters. No hydronephrosis. No perinephric or periureteral stranding. Urinary bladder is unremarkable. GI AND BOWEL: Stomach demonstrates no acute abnormality. There is no bowel obstruction. Normal appendix (image 92). REPRODUCTIVE ORGANS: The prostate is unremarkable. PERITONEUM AND RETROPERITONEUM: No ascites. No free air. VASCULATURE: Aorta is normal in caliber. Atherosclerotic calcifications of the abdominal aorta and branch vessels, although patent. Occluded right SFA, chronic. ABDOMINAL AND PELVIS LYMPH NODES: No lymphadenopathy. BONES AND SOFT TISSUES: Degenerative changes of the visualized thoracolumbar spine. No acute osseous abnormality. No focal soft tissue abnormality. Post-surgical changes beneath the left anterior abdominal wall. Post-surgical changes related to prior left inguinal hernia repair. IMPRESSION: 1. No acute findings. 2. Stable postsurgical changes related to prior thymic carcinoma resection, as above. 3. Additional stable ancillary findings, as above. Electronically signed by: Pinkie Pebbles MD  02/22/2024 03:29 AM EDT RP Workstation: [id number]   DG Chest Port 1 View Result Date: 02/22/2024 EXAM: 1 VIEW XRAY OF THE CHEST 02/22/2024 02:30:00 AM COMPARISON: Comparison made to chest radiograph of April 21, 2023. CLINICAL HISTORY: Questionable sepsis - evaluate for abnormality. Encounter for Questionable sepsis - evaluate for abnormality FINDINGS: LUNGS AND PLEURA: Left lung volume loss. Heterogeneous opacities overlying left mid lung zone, favored to represent combination of scarring and surgical staples. Surgical staples overlying left mid lung zone. No focal pulmonary opacity. No pulmonary edema. No pleural effusion. No pneumothorax. HEART AND MEDIASTINUM: Cardiac loop recorder overlying left chest wall. Aortic arch calcifications. No acute abnormality of the cardiac and mediastinal silhouettes. BONES AND SOFT TISSUES: No acute osseous abnormality. IMPRESSION: 1. Left lung volume loss with heterogeneous opacities in the left mid lung zone, favored to represent combination of scarring and surgical staples. Electronically signed by: Dorethia Molt MD 02/22/2024 02:40 AM EDT RP Workstation: [id number]     LOS: 1 day   Donalda Applebaum, MD  [facility]    To contact the attending provider between 7A-7P or the covering provider during after hours 7P-7A, please log into the web site [email or web address] and access using universal [place] password for that web site. If you do not have the password, please call the hospital operator.  02/23/2024, 9:35 AM

## 2024-02-23 NOTE — Evaluation (Signed)
 Physical Therapy Evaluation Patient Details Name: Angel [name] MRN: [id number] DOB: [birth year] Today's Date: 02/23/2024  History of Present Illness  73 y.o.  male who presented with failure to thrive/confusion for at least 2-3 days prior to this hospitalization-on day of hospitalization- he had decreased consciousness-and was difficult to arouse, He was subsequently brought to the hospital-where he was found to have AKI.PMH of tongue cancer-s/p left neck dissection chemo/radiation in 2015-currently in remission-recent thymic cancer-s/p thymectomy in 2024-PAD, CAD, brief episode of PAF (not on anticoagulation) COPD-  Clinical Impression  Pt is presenting at Mod I for bed mobility, CGA for sit to stand with RW and CGA to Mod A for gait. Pt relies heavily on AD for balance during gait and is at a high risk for falls. Due to pt current functional status, home set up and available assistance at home recommending skilled physical therapy services < 3 hours/day in order to address strength, balance and functional mobility to decrease risk for falls, injury, immobility, skin break down and re-hospitalization.          If plan is discharge home, recommend the following: Assistance with cooking/housework;Assist for transportation;Help with stairs or ramp for entrance   Can travel by private vehicle   Yes    Equipment Recommendations BSC/3in1     Functional Status Assessment Patient has had a recent decline in their functional status and demonstrates the ability to make significant improvements in function in a reasonable and predictable amount of time.     Precautions / Restrictions Precautions Precautions: Fall Recall of Precautions/Restrictions: Intact Restrictions Weight Bearing Restrictions Per Provider Order: No      Mobility  Bed Mobility Overal bed mobility: Modified Independent    Transfers Overall transfer level: Needs assistance Equipment used: Rolling walker (2  wheels) Transfers: Sit to/from Stand, Bed to chair/wheelchair/BSC Sit to Stand: Contact guard assist   Step pivot transfers: Contact guard assist       General transfer comment: CGA for safety, Pt pushes RW far ahead of him, does not keep it close despite heavy multi modal cues    Ambulation/Gait Ambulation/Gait assistance: Contact guard assist, Mod assist Gait Distance (Feet): 60 Feet Assistive device: Rolling walker (2 wheels) Gait Pattern/deviations: Step-through pattern, Trunk flexed, Wide base of support, Decreased stride length Gait velocity: decreased Gait velocity interpretation: <1.8 ft/sec, indicate of risk for recurrent falls   General Gait Details: Attempted to ambulate without RW at Mod A, Min A to CGA for gait with very anterior COM over BOS with brief correction each time pt was provided with multi modal cues.     Balance Overall balance assessment: Needs assistance Sitting-balance support: No upper extremity supported, Feet supported Sitting balance-Leahy Scale: Fair     Standing balance support: During functional activity, Reliant on assistive device for balance, Bilateral upper extremity supported, No upper extremity supported Standing balance-Leahy Scale: Poor Standing balance comment: Mod A to prevent fall when trying to ambulate without an AD.       Pertinent Vitals/Pain Pain Assessment Pain Assessment: No/denies pain    Home Living Family/patient expects to be discharged to:: Private residence Living Arrangements: Spouse/significant other Available Help at Discharge: Family;Available 24 hours/day Type of Home: House Home Access: Stairs to enter Entrance Stairs-Rails: Left Entrance Stairs-Number of Steps: 4   Home Layout: One level Home Equipment: Agricultural [employer]ant (2 wheels);BSC/3in1;Wheelchair - manual;Shower seat Additional Comments: Pt reports he lives with wife    Prior Function Prior Level of Function : Independent/Modified Independent  Mobility Comments: He was independent with ambulation. ADLs Comments: He was independent with ADLs and he shared cooking and cleaning with spouse.     Extremity/Trunk Assessment   Upper Extremity Assessment Upper Extremity Assessment: Defer to OT evaluation    Lower Extremity Assessment Lower Extremity Assessment: Generalized weakness    Cervical / Trunk Assessment Cervical / Trunk Assessment: Kyphotic  Communication   Communication Communication: No apparent difficulties    Cognition Arousal: Alert Behavior During Therapy: WFL for tasks assessed/performed   PT - Cognitive impairments: Safety/Judgement, Problem solving, Sequencing, Attention     Following commands: Impaired Following commands impaired: Follows one step commands inconsistently     Cueing Cueing Techniques: Verbal cues     General Comments General comments (skin integrity, edema, etc.): no signs/symptoms of cardiac/respiratory distress during session        Assessment/Plan    PT Assessment Patient needs continued PT services  PT Problem List Decreased strength;Decreased activity tolerance;Decreased balance;Decreased mobility;Decreased safety awareness;Decreased knowledge of use of DME       PT Treatment Interventions DME instruction;Balance training;Gait training;Stair training;Functional mobility training;Therapeutic activities;Therapeutic exercise;Patient/family education    PT Goals (Current goals can be found in the Care Plan section)  Acute Rehab PT Goals Patient Stated Goal: to improve mobility PT Goal Formulation: With patient Time For Goal Achievement: 03/08/24 Potential to Achieve Goals: Good    Frequency Min 2X/week        AM-PAC PT 6 Clicks Mobility  Outcome Measure Help needed turning from your back to your side while in a flat bed without using bedrails?: None Help needed moving from lying on your back to sitting on the side of a flat bed without using  bedrails?: None Help needed moving to and from a bed to a chair (including a wheelchair)?: A Little Help needed standing up from a chair using your arms (e.g., wheelchair or bedside chair)?: A Little Help needed to walk in hospital room?: A Lot Help needed climbing 3-5 steps with a railing? : A Lot 6 Click Score: 18    End of Session Equipment Utilized During Treatment: Gait belt Activity Tolerance: Patient tolerated treatment well Patient left: in bed;with call bell/phone within reach;with bed alarm set Nurse Communication: Mobility status PT Visit Diagnosis: Unsteadiness on feet (R26.81);Other abnormalities of gait and mobility (R26.89);Muscle weakness (generalized) (M62.81)    Time: 8594-8576 PT Time Calculation (min) (ACUTE ONLY): 18 min   Charges:   PT Evaluation $PT Eval Low Complexity: 1 Low   PT General Charges $$ ACUTE PT VISIT: 1 Visit    Dorothyann Maier, DPT, CLT  Acute Rehabilitation Services Office: [phone] (Secure chat preferred)   Dorothyann VEAR Maier 02/23/2024, 3:18 PM

## 2024-02-23 NOTE — TOC Initial Note (Signed)
 Transition of Care [facility]) - Initial/Assessment Note    Patient Details  Name: Angel [name] MRN: [id number] Date of Birth: August 26, 1950  Transition [facility] Care [facility]) CM/SW Contact:    Inocente GORMAN Kindle, LCSW Phone Number: 02/23/2024, 5:24 PM  Clinical Narrative:                 CSW received consult for possible SNF placement at time of discharge. CSW spoke with patient. Patient reported that he lives at home with spouse. Patient expressed understanding of PT recommendation but would like to consult with his wife first. He provided verbal consent for patient to contact his wife. CSW discussed insurance authorization process and will provide Medicare SNF ratings list. CSW attempted to call his wife but voicemail was full.     Skilled Nursing Rehab Facilities-   [email or web address]   Ratings out of 5 stars (5 the highest)  Name Address  Phone # Quality Care Staffing Health Inspection Overall  [facility] [address] [phone] 2 1 2 1   [facility] [address], [place] [phone] 5 2 4 5   [facility] [address], [place] [phone] 2 1 1 1   [facility] [address], [place] [phone] 4 3 4 4   [facility]  [address], [place] [phone] 4 3 5 5   [facility] [address] [place] [facility] [phone] 5 3 2 3   [facility] [address] [place] [phone] 5 1 2 2   [facility] [address], [place] [phone] 3 4 4 4   [address] ([facility]) [address], [place] [phone] 1 3 3 2   [facility] [address] [place], [place] [phone] 4 2 2 2   [facility] ([place]) [address], [place] [phone] 2 1 1 1   Clotilda Pereyra [address] [place] [phone] 2 4 4 4   [facility] [address] [place] [phone] 3 1 3 2   [facility]) [address]  [phone] 1 2 4 3           [facility] [address] [place] [phone] 3 1 5 4   [facility] [address], [place] [phone] 4 2 1 1   [facility]  [address] [place] [phone] 2 4 1 1   [facility] [address] [phone] 2 2 5 5   [facility] [address] [place] [address], [place] [phone] 2 2 3 3           [facility] [address] [place] [phone] 2 1 2 1   [facility] (No [facility]) [address] [place], [place] [phone] 4 3 4 4   [facility] [address], [place] [facility] [phone] 3  5 5   [facility] [address], [place] [phone] 4 2 1 1   [facility] [address] [place] [phone] 3 1 2 1   [facility] [address], [place] [phone] 1 3 3 2   [facility] [address] [place] [phone] 2 2 3 3   [facility] [address] [place], [place] [phone] 2 1 4 3   [facility] [address], [facility] [phone] 2 1 1 1   [facility] [address] [place], [place] [phone] 2 1 2 1   [facility] [address] [place] [phone] 3 1 2 1           [facility] [address] [place] [phone] 4  1 3 2   [facility] [address] [place]  [phone] 2 4 4 4   [facility] (No [employer]) [address], [place] [phone] 3 2 5 5   [facility]) [address], [place] [phone] 3 2 3 3   [facility] [address] [place] [phone] 5 3 5 5   [facility] [address], [place] [phone] 2 1 1 1   [facility] [address] [place], [place] [phone] [facility] [address] [place], [place] [phone] 5 4 5 5   [facility])  [address], [place] [phone] 1 1 2 1   [facility]) [address] [place], [place] [phone]  2 4 4   [facility] [address] [place] [phone] 3 5 5 5   [address] [place], [place] [phone] 4 2 2 2   [facility] [address] [place] [phone] 1 1 3 1   [facility] [address], [place] [phone] 2 2 2 2        Barriers to Discharge: Continued Medical Work up   Patient Goals and CMS Choice   CMS Medicare.gov Compare Post Acute Care list provided to:: Patient Choice offered to / list presented to : Patient [place] ownership interest in [facility].provided to:: Patient    Expected Discharge Plan and Services In-house Referral: Clinical Social Work     Living arrangements for the past 2 months: Single Family Home                                      Prior Living Arrangements/Services Living arrangements for the past 2 months: Single Family Home Lives with:: Spouse Patient language and need for interpreter reviewed:: Yes Do you feel safe going back to the place where you live?: Yes      Need for Family Participation in Patient Care: Yes (Comment) Care giver support system in place?: No (comment) Current home services: DME (walker, shower chair, BSC) Criminal Activity/Legal Involvement Pertinent to Current Situation/Hospitalization: No - Comment as needed  Activities of Daily Living      Permission Sought/Granted Permission sought to share information with : Facility Medical Sales Representative, Family Supports Permission granted to share information with : Yes, Verbal Permission Granted  Share Information with NAME: Dupee,Betsy- Spouse [phone]           Emotional Assessment Appearance:: Appears stated age Attitude/Demeanor/Rapport: Engaged Affect (typically observed): Accepting, Appropriate Orientation: : Oriented to Self, Oriented to Place, Oriented to  Time Alcohol  / Substance Use: Not Applicable Psych Involvement: No (comment)  Admission diagnosis:  Transient  alteration of awareness [R40.4] Elevated lactic acid level [R79.89] Sepsis (HCC) [A41.9] Patient Active Problem List   Diagnosis Date Noted   Sepsis (HCC) 02/22/2024   Underweight 08/28/2023   Lesion of pancreas 08/28/2023   Iron deficiency anemia 04/02/2023   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   Ileus (HCC) 03/25/2023   Dilated pancreatic duct 03/25/2023   Abdominal pain 03/24/2023   Penile ulcer 03/24/2023   Marijuana abuse 03/24/2023   Mediastinal mass 03/03/2023   Claudication in peripheral vascular disease 11/29/2022   Syncope and collapse 10/22/2022   Loop implantation:  [employer] Assert-IQ 3 Loop recorder 10/22/22 Serial # [id number] 10/22/2022   Acquired hypothyroidism 02/14/2017   Essential hypertension 02/14/2017  Lymphedema 05/05/2014   Headache 01/25/2014   Preventive measure 01/01/2014   Diarrhea 11/15/2013   Bilateral leg edema 11/15/2013   Insomnia 11/12/2013   Hypomagnesemia 11/12/2013   Generalized weakness 10/27/2013   Fall 10/27/2013   Thrombocytosis 10/27/2013   Leukocytosis 10/25/2013   Anemia 10/05/2013   Cancer of base of tongue (HCC) 09/17/2013   PCP:  Corlis Pagan, NP Pharmacy:   [facility] [address] [place] - [address] [place] [address] Phone: [phone] Fax: [phone]  [facility] [place], [place] - [address] [place] [address] Phone: [phone] Fax: [phone]     Social Drivers of Health (SDOH) Social History: SDOH Screenings   Food Insecurity: No Food Insecurity (02/22/2024)  Housing: Unknown (02/22/2024)  Transportation Needs: No Transportation Needs (02/22/2024)  Utilities: Not At Risk (02/22/2024)  Depression (PHQ2-9): Low Risk  (04/09/2023)  Social Connections: Socially Integrated (02/22/2024)  Tobacco Use: Medium Risk (02/22/2024)   SDOH Interventions:     Readmission Risk Interventions    03/07/2023    1:51 PM   Readmission Risk Prevention Plan  Transportation Screening Complete  Home Care Screening Complete  Medication Review (RN CM) Complete

## 2024-02-23 NOTE — Progress Notes (Incomplete)
 Initial Nutrition Assessment  DOCUMENTATION CODES:      INTERVENTION:  ***   NUTRITION DIAGNOSIS:     related to   as evidenced by  .  ***  GOAL:      ***  MONITOR:      REASON FOR ASSESSMENT:   Consult Assessment of nutrition requirement/status  ASSESSMENT:      ***   NUTRITION - FOCUSED PHYSICAL EXAM:  {RD Focused Exam List:21252}  Diet Order:   Diet Order             Diet regular Room service appropriate? No; Fluid consistency: Thin  Diet effective now                   EDUCATION NEEDS:      Skin:     Last BM:     Height:   Ht Readings from Last 1 Encounters:  02/22/24 5' 5 (1.651 m)    Weight:   Wt Readings from Last 1 Encounters:  02/22/24 53.8 kg    Ideal Body Weight:     BMI:  Body mass index is 19.74 kg/m.  Estimated Nutritional Needs:   Kcal:     Protein:     Fluid:       ***

## 2024-02-23 NOTE — Plan of Care (Signed)

## 2024-02-24 DIAGNOSIS — E43 Unspecified severe protein-calorie malnutrition: Secondary | ICD-10-CM | POA: Insufficient documentation

## 2024-02-24 DIAGNOSIS — R627 Adult failure to thrive: Secondary | ICD-10-CM | POA: Diagnosis not present

## 2024-02-24 DIAGNOSIS — R404 Transient alteration of awareness: Secondary | ICD-10-CM | POA: Diagnosis not present

## 2024-02-24 DIAGNOSIS — R7989 Other specified abnormal findings of blood chemistry: Secondary | ICD-10-CM | POA: Diagnosis not present

## 2024-02-24 DIAGNOSIS — N179 Acute kidney failure, unspecified: Secondary | ICD-10-CM | POA: Diagnosis not present

## 2024-02-24 LAB — CBC
HCT: 33.6 % — ABNORMAL LOW (ref 39.0–52.0)
Hemoglobin: 11 g/dL — ABNORMAL LOW (ref 13.0–17.0)
MCH: 29.7 pg (ref 26.0–34.0)
MCHC: 32.7 g/dL (ref 30.0–36.0)
MCV: 90.8 fL (ref 80.0–100.0)
Platelets: 293 K/uL (ref 150–400)
RBC: 3.7 MIL/uL — ABNORMAL LOW (ref 4.22–5.81)
RDW: 15.4 % (ref 11.5–15.5)
WBC: 6.3 K/uL (ref 4.0–10.5)
nRBC: 0 % (ref 0.0–0.2)

## 2024-02-24 LAB — BASIC METABOLIC PANEL WITH GFR
Anion gap: 11 (ref 5–15)
BUN: 9 mg/dL (ref 8–23)
CO2: 24 mmol/L (ref 22–32)
Calcium: 8.3 mg/dL — ABNORMAL LOW (ref 8.9–10.3)
Chloride: 102 mmol/L (ref 98–111)
Creatinine, Ser: 0.85 mg/dL (ref 0.61–1.24)
GFR, Estimated: >60 mL/min (ref >60)
Glucose, Bld: 100 mg/dL — ABNORMAL HIGH (ref 70–99)
Potassium: 3.2 mmol/L — ABNORMAL LOW (ref 3.5–5.1)
Sodium: 137 mmol/L (ref 135–145)

## 2024-02-24 LAB — CORTISOL-AM, BLOOD: Cortisol - AM: 9.3 ug/dL (ref 6.7–22.6)

## 2024-02-24 MED ORDER — METOPROLOL TARTRATE 12.5 MG HALF TABLET
12.5000 mg | ORAL_TABLET | Freq: Two times a day (BID) | ORAL | Status: DC
  Administered 2024-02-24 – 2024-02-25 (×4): 12.5 mg via ORAL
  Filled 2024-02-24 (×4): qty 1

## 2024-02-24 MED ORDER — POTASSIUM CHLORIDE CRYS ER 20 MEQ PO TBCR
40.0000 meq | EXTENDED_RELEASE_TABLET | Freq: Once | ORAL | Status: AC
  Administered 2024-02-24: 40 meq via ORAL
  Filled 2024-02-24: qty 2

## 2024-02-24 MED ORDER — ADULT MULTIVITAMIN W/MINERALS CH
1.0000 | ORAL_TABLET | Freq: Every day | ORAL | Status: DC
  Administered 2024-02-24 – 2024-02-26 (×3): 1 via ORAL
  Filled 2024-02-24 (×3): qty 1

## 2024-02-24 MED ORDER — PANTOPRAZOLE SODIUM 40 MG PO TBEC
40.0000 mg | DELAYED_RELEASE_TABLET | Freq: Every day | ORAL | Status: DC
  Administered 2024-02-24 – 2024-02-25 (×2): 40 mg via ORAL
  Filled 2024-02-24 (×2): qty 1

## 2024-02-24 MED ORDER — ALUM & MAG HYDROXIDE-SIMETH 200-200-20 MG/5ML PO SUSP
30.0000 mL | Freq: Four times a day (QID) | ORAL | Status: DC | PRN
  Administered 2024-02-24: 30 mL via ORAL
  Filled 2024-02-24: qty 30

## 2024-02-24 NOTE — TOC Progression Note (Signed)
 Transition of Care [facility]) - Progression Note    Patient Details  Name: Angel [name] MRN: [id number] Date of Birth: [birth year]  [facility]) CM/SW Contact  Inocente GORMAN Kindle, LCSW Phone Number: 02/24/2024, 2:21 PM  Clinical Narrative:    CSW contacted patient's spouse who confirmed the listed phone number is the only one she has. CSW explained SNF recommendation. She stated she thinks she would rather have patient come home. CSW described limitations of home health services. She stated she will speak with the patient and call CSW back but is ok if CSW sends out referrals just in case. CSW made here aware that patient is likely for ready discharge in the next day or two.      Barriers to Discharge: Continued Medical Work up, Air Traffic Controller and Services In-house Referral: Clinical Social Work     Living arrangements for the past 2 months: Single Family Home                                       Social Drivers of Health (SDOH) Interventions SDOH Screenings   Food Insecurity: No Food Insecurity (02/22/2024)  Housing: Unknown (02/22/2024)  Transportation Needs: No Transportation Needs (02/22/2024)  Utilities: Not At Risk (02/22/2024)  Depression (PHQ2-9): Low Risk  (04/09/2023)  Social Connections: Socially Integrated (02/22/2024)  Tobacco Use: Medium Risk (02/22/2024)    Readmission Risk Interventions    03/07/2023    1:51 PM  Readmission Risk Prevention Plan  Transportation Screening Complete  Home Care Screening Complete  Medication Review (RN CM) Complete

## 2024-02-24 NOTE — Progress Notes (Addendum)
 PROGRESS NOTE        PATIENT DETAILS Name: Angel [name] Age: 73 y.o. Sex: male Date of Birth: 1950-10-20 Admit Date: 02/22/2024 Admitting Physician Dorn Dawson, MD ERE:Ujuz, Izetta, NP  Brief Summary: Patient is a 73 y.o.  male with history of tongue cancer-s/p left neck dissection chemo/radiation in 2015-currently in remission-recent thymic cancer-s/p thymectomy in 2024-PAD, CAD, brief episode of PAF (not on anticoagulation) COPD-who presented with failure to thrive/confusion for at least 2-3 days prior to this hospitalization-on day of hospitalization- he had decreased consciousness-and was difficult to arouse.  He was subsequently brought to the hospital-where he was found to have AKI-and subsequently admitted to the hospitalist service.  Significant events: 10/26>> admit to [facility].  Significant studies: 10/26>> CT chest/abdomen/pelvis: No acute findings. 10/28>> MRI brain: No acute abnormality. 10/28>> vitamin B12: 290 10/28>> vitamin B1: Pending 10/29>> AM cortisol: 9.3  Significant microbiology data: 10/26>> COVID/influenza/RSV PCR: Negative 10/26>> respiratory virus panel: Negative 10/26>> blood culture: No growth  Procedures: None  Consults: None  Subjective: Per nursing staff-overnight had some episodes of confusion-this morning better than how he was yesterday-needs some gentle redirection-but relatively more awake and alert compared to how he was last morning.  Objective: Vitals: Blood pressure (!) 171/99, pulse 75, temperature 98.1 F (36.7 C), temperature source Oral, resp. rate (!) 21, height 5' 5 (1.651 m), weight 53.8 kg, SpO2 96%.   Exam: Gen Exam:Alert awake-not in any distress HEENT:atraumatic, normocephalic Chest: B/L clear to auscultation anteriorly CVS:S1S2 regular Abdomen:soft non tender, non distended Extremities:no edema Neurology: Non focal Skin: no rash  Pertinent Labs/Radiology:    Latest Ref Rng & Units  02/24/2024    3:26 AM 02/23/2024    3:23 AM 02/22/2024    1:47 AM  CBC  WBC 4.0 - 10.5 K/uL 6.3  7.8  11.0   Hemoglobin 13.0 - 17.0 g/dL 88.9  88.1  86.4   Hematocrit 39.0 - 52.0 % 33.6  35.5  40.4   Platelets 150 - 400 K/uL 293  283  285     Lab Results  Component Value Date   NA 137 02/24/2024   K 3.2 (L) 02/24/2024   CL 102 02/24/2024   CO2 24 02/24/2024      Assessment/Plan: Acute metabolic encephalopathy Probably multifactorial-dehydration/failure to thrive syndrome playing a major role-unclear if there is some psychogenic component (marital stress at home) Much better today but still needs some redirection (at baseline no cognitive issues per spouse) Volume status stable today-started to eat some-although appetite not great-stop IV fluids He does acknowledge not taking his antidepressants for several weeks at home-this has been resumed-claims his mood is stable today. B12 on the lower side-supplementation has been started. Continue delirium precautions Anticipate some amount of delirium as long as he is hospitalized.  Hypotension BP in the 80s systolic when EMS presented at home Etiology likely secondary to dehydration No indication of infection or sepsis physiology so far (ruled out) BP has now stabilized with supportive care Continue to monitor off antibiotics Continue to follow cultures.  Failure to thrive syndrome/dehydration Unclear etiology-significant marital stressors at home (per spouse-they have not been getting along for a while)-suspect some element of hypothyroidism due to noncompliance to Synthroid  (spouse not sure if he is taking his medications as prescribed)  Oral intake poor but slowly improving Stop IVF as volume status is stable Continue  Synthroid  Awaiting nutrition evaluation AM cortisol stable. Continue to mobilize with PT/OT-SNF recommended  AKI Likely hemodynamically mediated Resolved  Depression Per spouse-significant stressors at  home-she suspects noncompliance with Lexapro  (confirmed with patient on 10/27 afternoon during revisit)-and that patient's depression may be playing a role in his symptoms For now-his seems stable-Lexapro herminio has been resumed-hold off on further psychiatry evaluation and see how he does.    Hypothyroidism TSH elevated-some suspicion for noncompliance-see discussion above Continue Synthroid  Recheck TSH in 4 to 6 weeks  Vitamin B12 deficiency Begin supplementation.  Nonobstructive CAD No anginal symptoms Continue Plavix /statin  PAD-s/p PCI to left [facility] August 2024 Poor historian but this appears stable Continue Plavix /Crestor .  HTN BP creeping up Resume metoprolol  at a lower dose. Follow/optimize.  HLD Crestor   COPD Stable-not in exacerbation As needed bronchodilators  Hypokalemia Replete/recheck  GERD PPI  History of thymic carcinoma-s/p resection/adjuvant radiation 2024 History of stage IV tongue cancer-s/p chemoradiation in 2015 Apparently in remission Follows with oncology.  Chronic diffuse pancreatic duct dilatation On outpatient surveillance imaging per oncology.   Nutrition Status: Nutrition Problem: Severe Malnutrition Etiology: social / environmental circumstances Signs/Symptoms: severe muscle depletion, severe fat depletion, energy intake < or equal to 50% for > or equal to 1 month Interventions: Ensure Enlive (each supplement provides 350kcal and 20 grams of protein), MVI    Code status:   Code Status: Full Code   DVT Prophylaxis: enoxaparin  (LOVENOX ) injection 40 mg Start: 02/22/24 0900   Family Communication: Spoke-Betsy Gillean [phone] -called-10/28-unable to leave VM as mailbox full.   Disposition Plan: Status is: Inpatient Remains inpatient appropriate because: Severity of illness   Planned Discharge Destination:Skilled nursing facility   Diet: Diet Order             Diet regular Room service appropriate? No; Fluid  consistency: Thin  Diet effective now                     Antimicrobial agents: Anti-infectives (From admission, onward)    Start     Dose/Rate Route Frequency Ordered Stop   02/22/24 0700  ceFEPIme  (MAXIPIME ) 2 g in sodium chloride  0.9 % 100 mL IVPB        2 g 200 mL/hr over 30 Minutes Intravenous  Once 02/22/24 0645 02/22/24 0730   02/22/24 0700  metroNIDAZOLE  (FLAGYL ) IVPB 500 mg  Status:  Discontinued        500 mg 100 mL/hr over 60 Minutes Intravenous  Once 02/22/24 0645 02/22/24 0645   02/22/24 0700  vancomycin  (VANCOCIN ) IVPB 1000 mg/200 mL premix  Status:  Discontinued        1,000 mg 200 mL/hr over 60 Minutes Intravenous  Once 02/22/24 0645 02/22/24 0646   02/22/24 0700  vancomycin  (VANCOREADY) IVPB 1250 mg/250 mL        1,250 mg 166.7 mL/hr over 90 Minutes Intravenous  Once 02/22/24 0646 02/22/24 0904        MEDICATIONS: Scheduled Meds:  clopidogrel   75 mg Oral Daily   cyanocobalamin  1,000 mcg Subcutaneous q1800   enoxaparin  (LOVENOX ) injection  40 mg Subcutaneous Q24H   escitalopram   20 mg Oral Daily   feeding supplement  237 mL Oral BID BM   levothyroxine   100 mcg Oral Q0600   pantoprazole   40 mg Oral Q1200   rosuvastatin   20 mg Oral Daily   traZODone  50 mg Oral QHS   Continuous Infusions:   PRN Meds:.acetaminophen , alum & mag hydroxide-simeth, ipratropium-albuterol , ondansetron  (ZOFRAN ) IV  I have personally reviewed following labs and imaging studies  LABORATORY DATA: CBC: Recent Labs  Lab 02/22/24 0147 02/23/24 0323 02/24/24 0326  WBC 11.0* 7.8 6.3  HGB 13.5 11.8* 11.0*  HCT 40.4 35.5* 33.6*  MCV 92.4 92.0 90.8  PLT 285 283 293    Basic Metabolic Panel: Recent Labs  Lab 02/22/24 0147 02/23/24 0323 02/24/24 0326  NA 138 140 137  K 3.8 3.9 3.2*  CL 102 102 102  CO2 22 24 24   GLUCOSE 147* 98 100*  BUN 15 9 9   CREATININE 1.41* 1.05 0.85  CALCIUM  8.7* 8.9 8.3*    GFR: Estimated Creatinine Clearance: 60.7 mL/min (by C-G  formula based on SCr of 0.85 mg/dL).  Liver Function Tests: Recent Labs  Lab 02/22/24 0147  AST 24  ALT 12  ALKPHOS 54  BILITOT 0.9  PROT 6.5  ALBUMIN  3.5   No results for input(s): LIPASE, AMYLASE in the last 168 hours. Recent Labs  Lab 02/23/24 1028  AMMONIA 18    Coagulation Profile: Recent Labs  Lab 02/22/24 0221  INR 1.0    Cardiac Enzymes: No results for input(s): CKTOTAL, CKMB, CKMBINDEX, TROPONINI in the last 168 hours.  BNP (last 3 results) No results for input(s): PROBNP in the last 8760 hours.  Lipid Profile: No results for input(s): CHOL, HDL, LDLCALC, TRIG, CHOLHDL, LDLDIRECT in the last 72 hours.  Thyroid  Function Tests: Recent Labs    02/22/24 0147 02/22/24 1322  TSH 17.160*  --   FREET4  --  0.70    Anemia Panel: Recent Labs    02/23/24 1028  VITAMINB12 290    Urine analysis:    Component Value Date/Time   COLORURINE YELLOW 02/22/2024 0549   APPEARANCEUR CLEAR 02/22/2024 0549   LABSPEC 1.033 (H) 02/22/2024 0549   PHURINE 6.0 02/22/2024 0549   GLUCOSEU NEGATIVE 02/22/2024 0549   HGBUR SMALL (A) 02/22/2024 0549   BILIRUBINUR NEGATIVE 02/22/2024 0549   KETONESUR NEGATIVE 02/22/2024 0549   PROTEINUR NEGATIVE 02/22/2024 0549   UROBILINOGEN 0.2 11/03/2013 2303   NITRITE NEGATIVE 02/22/2024 0549   LEUKOCYTESUR NEGATIVE 02/22/2024 0549    Sepsis Labs: Lactic Acid, Venous    Component Value Date/Time   LATICACIDVEN 0.7 02/22/2024 1633    MICROBIOLOGY: Recent Results (from the past 240 hours)  Blood Culture (routine x 2)     Status: None (Preliminary result)   Collection Time: 02/22/24  2:09 AM   Specimen: BLOOD  Result Value Ref Range Status   Specimen Description BLOOD RIGHT ANTECUBITAL  Final   Special Requests   Final    BOTTLES DRAWN AEROBIC AND ANAEROBIC Blood Culture adequate volume   Culture   Final    NO GROWTH 2 DAYS Performed at [facility], [address] [place], [place]  [address]    Report Status PENDING  Incomplete  Blood Culture (routine x 2)     Status: None (Preliminary result)   Collection Time: 02/22/24  2:21 AM   Specimen: BLOOD LEFT ARM  Result Value Ref Range Status   Specimen Description BLOOD LEFT ARM  Final   Special Requests   Final    BOTTLES DRAWN AEROBIC AND ANAEROBIC Blood Culture adequate volume   Culture   Final    NO GROWTH 2 DAYS Performed at [facility], [address], [place], [place] [address]    Report Status PENDING  Incomplete  Resp panel by RT-PCR (RSV, Flu A&B, Covid) Anterior Nasal Swab  Status: None   Collection Time: 02/22/24  5:50 AM   Specimen: Anterior Nasal Swab  Result Value Ref Range Status   SARS Coronavirus 2 by RT PCR NEGATIVE NEGATIVE Final   Influenza A by PCR NEGATIVE NEGATIVE Final   Influenza B by PCR NEGATIVE NEGATIVE Final    Comment: (NOTE) The Xpert Xpress SARS-CoV-2/FLU/RSV plus assay is intended as an aid in the diagnosis of influenza from Nasopharyngeal swab specimens and should not be used as a sole basis for treatment. Nasal washings and aspirates are unacceptable for Xpert Xpress SARS-CoV-2/FLU/RSV testing.  Fact Sheet for Patients: bloggercourse.com  Fact Sheet for Healthcare Providers: seriousbroker.it  This test is not yet approved or cleared by the [place]  FDA and has been authorized for detection and/or diagnosis of SARS-CoV-2 by FDA under an Emergency Use Authorization (EUA). This EUA will remain in effect (meaning this test can be used) for the duration of the COVID-19 declaration under Section 564(b)(1) of the Act, 21 U.S.C. section 360bbb-3(b)(1), unless the authorization is terminated or revoked.     Resp Syncytial Virus by PCR NEGATIVE NEGATIVE Final    Comment: (NOTE) Fact Sheet for Patients: bloggercourse.com  Fact Sheet for Healthcare  Providers: seriousbroker.it  This test is not yet approved or cleared by the [place]  FDA and has been authorized for detection and/or diagnosis of SARS-CoV-2 by FDA under an Emergency Use Authorization (EUA). This EUA will remain in effect (meaning this test can be used) for the duration of the COVID-19 declaration under Section 564(b)(1) of the Act, 21 U.S.C. section 360bbb-3(b)(1), unless the authorization is terminated or revoked.  Performed at [facility], [address], [place], [place] [address]   Respiratory (~20 pathogens) panel by PCR     Status: None   Collection Time: 02/22/24 12:45 PM   Specimen: Nasopharyngeal Swab; Respiratory  Result Value Ref Range Status   Adenovirus NOT DETECTED NOT DETECTED Final   Coronavirus 229E NOT DETECTED NOT DETECTED Final    Comment: (NOTE) The Coronavirus on the Respiratory Panel, DOES NOT test for the novel  Coronavirus (2019 nCoV)    Coronavirus HKU1 NOT DETECTED NOT DETECTED Final   Coronavirus NL63 NOT DETECTED NOT DETECTED Final   Coronavirus OC43 NOT DETECTED NOT DETECTED Final   Metapneumovirus NOT DETECTED NOT DETECTED Final   Rhinovirus / Enterovirus NOT DETECTED NOT DETECTED Final   Influenza A NOT DETECTED NOT DETECTED Final   Influenza B NOT DETECTED NOT DETECTED Final   Parainfluenza Virus 1 NOT DETECTED NOT DETECTED Final   Parainfluenza Virus 2 NOT DETECTED NOT DETECTED Final   Parainfluenza Virus 3 NOT DETECTED NOT DETECTED Final   Parainfluenza Virus 4 NOT DETECTED NOT DETECTED Final   Respiratory Syncytial Virus NOT DETECTED NOT DETECTED Final   Bordetella pertussis NOT DETECTED NOT DETECTED Final   Bordetella Parapertussis NOT DETECTED NOT DETECTED Final   Chlamydophila pneumoniae NOT DETECTED NOT DETECTED Final   Mycoplasma pneumoniae NOT DETECTED NOT DETECTED Final    Comment: Performed at [facility], [address], [place], [place] [address]    RADIOLOGY  STUDIES/RESULTS: MR BRAIN WO CONTRAST Result Date: 02/23/2024 EXAM: MRI BRAIN WITHOUT CONTRAST 02/23/2024 06:15:21 PM TECHNIQUE: Multiplanar multisequence MRI of the head/brain was performed without the administration of intravenous contrast. COMPARISON: Prior CT from 09/16/2023. CLINICAL HISTORY: Mental status change, unknown cause. FINDINGS: BRAIN AND VENTRICLES: No acute infarct. No intracranial hemorrhage. No mass. No midline shift. No hydrocephalus. Diffuse ventricular prominence most likely related  to underlying atrophy without hydrocephalus. No extra-axial fluid collection. Generalized age-related atrophy. Patchy T2 FLAIR hyperintensity involving the periventricular and deep white matter of multiple cerebral hemispheres as well as the pons, most characteristic of chronic microvascular ischemic disease, moderate to advanced in nature. Few remote lacunar infarcts present about the deep gray nuclei. The sellar/suprasellar regions appear unremarkable. Normal flow voids. Loss of normal flow void within the right vertebral artery, which could reflect slow flow and/or occlusion. ORBITS: No acute abnormality. Probable remote posttraumatic defect at the right lamina papyracea noted. SINUSES AND MASTOIDS: Small right mastoid effusion noted, of indeterminate significance. BONES AND SOFT TISSUES: Normal marrow signal. No acute soft tissue abnormality. Degenerative spondylosis noted at C4-C5 without significant spinal stenosis. IMPRESSION: 1. No acute intracranial abnormality. 2. Atrophy with moderate to advanced chronic microvascular ischemic disease, with few remote lacunar infarcts about the deep gray nuclei. 3. Loss of normal flow void within the right vertebral artery, which could reflect slow flow or occlusion. Electronically signed by: Morene Hoard MD 02/23/2024 08:58 PM EDT RP Workstation: [id number]     LOS: 2 days   Donalda Applebaum, MD  [facility]    To contact the attending provider  between 7A-7P or the covering provider during after hours 7P-7A, please log into the web site [email or web address] and access using universal [place] password for that web site. If you do not have the password, please call the hospital operator.  02/24/2024, 9:19 AM

## 2024-02-24 NOTE — Plan of Care (Signed)
  Problem: Education: Goal: Knowledge of General Education information will improve Description: Including pain rating scale, medication(s)/side effects and non-pharmacologic comfort measures Outcome: Not Progressing   Problem: Health Behavior/Discharge Planning: Goal: Ability to manage health-related needs will improve Outcome: Not Progressing  Pt AMS, very forgetful

## 2024-02-24 NOTE — Progress Notes (Signed)
 Initial Nutrition Assessment  DOCUMENTATION CODES:   Severe malnutrition in context of social or environmental circumstances  INTERVENTION:  Daily multivitamin PO. Continue regular diet. Continue Ensure+HP BID. Encourage PO intake.   NUTRITION DIAGNOSIS:   Severe Malnutrition related to social / environmental circumstances as evidenced by severe muscle depletion, severe fat depletion, energy intake < or equal to 50% for > or equal to 1 month.   GOAL:   Patient will meet greater than or equal to 90% of their needs   MONITOR:   PO intake, Supplement acceptance, Weight trends, Labs  REASON FOR ASSESSMENT:   Consult Assessment of nutrition requirement/status  ASSESSMENT:   PMH significant for tongue cancer s/p L neck dissection, chemoradiation 2015, recent thymic cancer s/p thymectomy 2024, EGD/colonoscopy 02/2023, hernia repair 10/2021, HTN, PAD, CAD, COPD and hypothyroidism. Presented with failure to thrive/confusion X 2-3 days, difficulty to arouse and found to have AKI. Marital stress reported with wife suspecting this might be related to his depression and not eating/drinking/taking meds the past 2-3 days.  Visited the patient who was working on his breakfast but c/o severe heartburn with a bite of bacon. He states it is not normal for him to have heartburn. RN was in the room and was to inform provider. The patient started drinking an Ensure and said it helped him feel better. He has been working on one Ensure daily and he likes it. He agrees to aim for for two a day. The patient is not a great historian and seems quite confused. He tells me his UBW is 120 lbs and he has been that weight for years. He admits to going downhill since October 2024 when he was diagnosed with thymus cancer. He has been mobile up until one month ago when his legs would just give out. He mentions his wife a lot and asks me why he hasn't seen her. He cannot remember why he is at the hospital and  cannot remember if he ate yesterday. He tells me he sometimes does not eat even one meal at home. But when he does, he would have a [employer] biscuit for breakfast, a double cheeseburger for lunch and meatloaf with mashed potatoes for dinner. His wife cooks and they both share duties to obtain food which recently meant driving to a [employer] although he mentioned they both have leg issues the past month.   Scheduled Meds:  clopidogrel   75 mg Oral Daily   cyanocobalamin  1,000 mcg Subcutaneous q1800   enoxaparin  (LOVENOX ) injection  40 mg Subcutaneous Q24H   escitalopram   20 mg Oral Daily   feeding supplement  237 mL Oral BID BM   levothyroxine   100 mcg Oral Q0600   metoprolol  tartrate  12.5 mg Oral BID   multivitamin with minerals  1 tablet Oral Daily   pantoprazole   40 mg Oral Q1200   rosuvastatin   20 mg Oral Daily   traZODone  50 mg Oral QHS    Labs:     Latest Ref Rng & Units 02/24/2024    3:26 AM 02/23/2024    3:23 AM 02/22/2024    1:47 AM  CMP  Glucose 70 - 99 mg/dL 899  98  852   BUN 8 - 23 mg/dL 9  9  15    Creatinine 0.61 - 1.24 mg/dL 9.14  8.94  8.58   Sodium 135 - 145 mmol/L 137  140  138   Potassium 3.5 - 5.1 mmol/L 3.2  3.9  3.8  Chloride 98 - 111 mmol/L 102  102  102   CO2 22 - 32 mmol/L 24  24  22    Calcium  8.9 - 10.3 mg/dL 8.3  8.9  8.7   Total Protein 6.5 - 8.1 g/dL   6.5   Total Bilirubin 0.0 - 1.2 mg/dL   0.9   Alkaline Phos 38 - 126 U/L   54   AST 15 - 41 U/L   24   ALT 0 - 44 U/L   12     I/O: +3 L since admit  Meal Intake: Unable to obtain     NUTRITION - FOCUSED PHYSICAL EXAM:  Flowsheet Row Most Recent Value  Orbital Region Mild depletion  Upper Arm Region Severe depletion  Thoracic and Lumbar Region Moderate depletion  Buccal Region Severe depletion  Temple Region Severe depletion  Clavicle Bone Region Severe depletion  Clavicle and Acromion Bone Region Severe depletion  Scapular Bone Region Severe depletion  Dorsal Hand  Severe depletion  Patellar Region Severe depletion  Anterior Thigh Region Severe depletion  Posterior Calf Region Severe depletion  Edema (RD Assessment) None  Hair Reviewed  Eyes Reviewed  Mouth Reviewed  Skin Reviewed  Nails Reviewed     Diet Order             Diet regular Room service appropriate? No; Fluid consistency: Thin  Diet effective now                 E+HP BID  EDUCATION NEEDS:   Education needs have been addressed  Skin:  Skin Assessment: Reviewed RN Assessment  Last BM:  10/27 type 6  Height:   Ht Readings from Last 1 Encounters:  02/22/24 5' 5 (1.651 m)    Weight:     Ideal Body Weight:  62 kg  BMI:  Body mass index is 19.74 kg/m.  Estimated Nutritional Needs:   Kcal:  1700-1900  Protein:  95-115  Fluid:  >1700    Angel Ruth, MS, RDN, LDN [place]. [facility] See AMION for contact information

## 2024-02-24 NOTE — [place] FL2 (Signed)
 [place]  MEDICAID FL2 LEVEL OF CARE FORM     IDENTIFICATION  Patient Name: Angel [name] Birthdate: Jul 10, 1950 Sex: male Admission Date (Current Location): 02/22/2024  [facility] and [place] Number:  Producer, Television/film/video and Address:  The . [facility], [address], [place], [place] [address]      Provider Number: [id number]  Attending Physician Name and Address:  Raenelle Donalda HERO, MD  Relative Name and Phone Number:       Current Level of Care: Hospital Recommended Level of Care: Skilled Nursing Facility Prior Approval Number:    Date Approved/Denied:   PASRR Number: [id number]  Discharge Plan: SNF    Current Diagnoses: Patient Active Problem List   Diagnosis Date Noted   Protein-calorie malnutrition, severe 02/24/2024   Sepsis (HCC) 02/22/2024   Underweight 08/28/2023   Lesion of pancreas 08/28/2023   Iron deficiency anemia 04/02/2023   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   Ileus (HCC) 03/25/2023   Dilated pancreatic duct 03/25/2023   Abdominal pain 03/24/2023   Penile ulcer 03/24/2023   Marijuana abuse 03/24/2023   Mediastinal mass 03/03/2023   Claudication in peripheral vascular disease 11/29/2022   Syncope and collapse 10/22/2022   Loop implantation:  [employer] Assert-IQ 3 Loop recorder 10/22/22 Serial # [id number] 10/22/2022   Acquired hypothyroidism 02/14/2017   Essential hypertension 02/14/2017   Lymphedema 05/05/2014   Headache 01/25/2014   Preventive measure 01/01/2014   Diarrhea 11/15/2013   Bilateral leg edema 11/15/2013   Insomnia 11/12/2013   Hypomagnesemia 11/12/2013   Generalized weakness 10/27/2013   Fall 10/27/2013   Thrombocytosis 10/27/2013   Leukocytosis 10/25/2013   Anemia 10/05/2013   Cancer of base of tongue (HCC) 09/17/2013    Orientation RESPIRATION BLADDER Height & Weight     Self, Place  O2 (1L nasal cannula) Continent Weight: 118 lb 9.7 oz (53.8 kg) Height:  5' 5 (165.1 cm)  BEHAVIORAL SYMPTOMS/MOOD  NEUROLOGICAL BOWEL NUTRITION STATUS      Incontinent Diet (See dc summary)  AMBULATORY STATUS COMMUNICATION OF NEEDS Skin   Limited Assist Verbally Normal                       Personal Care Assistance Level of Assistance  Bathing, Feeding, Dressing Bathing Assistance: Limited assistance Feeding assistance: Independent Dressing Assistance: Limited assistance     Functional Limitations Info             SPECIAL CARE FACTORS FREQUENCY  PT (By licensed PT), OT (By licensed OT)     PT Frequency: 5x/week OT Frequency: 5x/week            Contractures Contractures Info: Not present    Additional Factors Info  Code Status, Allergies Code Status Info: Full Allergies Info: NKA           Current Medications (02/24/2024):  This is the current hospital active medication list Current Facility-Administered Medications  Medication Dose Route Frequency Provider Last Rate Last Admin   acetaminophen  (TYLENOL ) tablet 650 mg  650 mg Oral Q6H PRN Alfornia Madison, MD   650 mg at 02/23/24 0546   alum & mag hydroxide-simeth (MAALOX/MYLANTA) 200-200-20 MG/5ML suspension 30 mL  30 mL Oral Q6H PRN Raenelle Donalda HERO, MD   30 mL at 02/24/24 0952   clopidogrel  (PLAVIX ) tablet 75 mg  75 mg Oral Daily Georgina Basket, MD   75 mg at 02/24/24 0836   cyanocobalamin (VITAMIN B12) injection 1,000 mcg  1,000 mcg Subcutaneous q1800 Ghimire, Shanker M,  MD   1,000 mcg at 02/23/24 2127   enoxaparin  (LOVENOX ) injection 40 mg  40 mg Subcutaneous Q24H Georgina Basket, MD   40 mg at 02/24/24 9163   escitalopram  (LEXAPRO ) tablet 20 mg  20 mg Oral Daily Georgina Basket, MD   20 mg at 02/24/24 0836   feeding supplement (ENSURE PLUS HIGH PROTEIN) liquid 237 mL  237 mL Oral BID BM Raenelle Donalda HERO, MD   237 mL at 02/24/24 0916   ipratropium-albuterol  (DUONEB) 0.5-2.5 (3) MG/3ML nebulizer solution 3 mL  3 mL Nebulization Q6H PRN Raenelle Donalda HERO, MD       levothyroxine  (SYNTHROID ) tablet 100 mcg  100 mcg Oral  Q0600 Georgina Basket, MD   100 mcg at 02/24/24 9382   metoprolol  tartrate (LOPRESSOR ) tablet 12.5 mg  12.5 mg Oral BID Raenelle Donalda HERO, MD   12.5 mg at 02/24/24 9048   multivitamin with minerals tablet 1 tablet  1 tablet Oral Daily Ghimire, Shanker M, MD   1 tablet at 02/24/24 1005   ondansetron  (ZOFRAN ) injection 4 mg  4 mg Intravenous Q6H PRN Raenelle Donalda HERO, MD       pantoprazole  (PROTONIX ) EC tablet 40 mg  40 mg Oral Q1200 Ghimire, Shanker M, MD   40 mg at 02/24/24 1225   rosuvastatin  (CRESTOR ) tablet 20 mg  20 mg Oral Daily Georgina Basket, MD   20 mg at 02/24/24 0836   traZODone (DESYREL) tablet 50 mg  50 mg Oral QHS Ghimire, Shanker M, MD   50 mg at 02/23/24 2128     Discharge Medications: Please see discharge summary for a list of discharge medications.  Relevant Imaging Results:  Relevant Lab Results:   Additional Information SSN    [id number]  Inocente GORMAN Kindle, LCSW

## 2024-02-25 ENCOUNTER — Inpatient Hospital Stay (HOSPITAL_COMMUNITY)

## 2024-02-25 DIAGNOSIS — E43 Unspecified severe protein-calorie malnutrition: Secondary | ICD-10-CM | POA: Diagnosis not present

## 2024-02-25 DIAGNOSIS — R404 Transient alteration of awareness: Secondary | ICD-10-CM | POA: Diagnosis not present

## 2024-02-25 DIAGNOSIS — R7989 Other specified abnormal findings of blood chemistry: Secondary | ICD-10-CM | POA: Diagnosis not present

## 2024-02-25 LAB — MAGNESIUM: Magnesium: 2 mg/dL (ref 1.7–2.4)

## 2024-02-25 LAB — BASIC METABOLIC PANEL WITH GFR
Anion gap: 11 (ref 5–15)
BUN: 16 mg/dL (ref 8–23)
CO2: 26 mmol/L (ref 22–32)
Calcium: 8.7 mg/dL — ABNORMAL LOW (ref 8.9–10.3)
Chloride: 99 mmol/L (ref 98–111)
Creatinine, Ser: 0.94 mg/dL (ref 0.61–1.24)
GFR, Estimated: >60 mL/min (ref >60)
Glucose, Bld: 93 mg/dL (ref 70–99)
Potassium: 3.5 mmol/L (ref 3.5–5.1)
Sodium: 136 mmol/L (ref 135–145)

## 2024-02-25 MED ORDER — HYDRALAZINE HCL 20 MG/ML IJ SOLN
10.0000 mg | INTRAMUSCULAR | Status: DC | PRN
  Administered 2024-02-26: 10 mg via INTRAVENOUS
  Filled 2024-02-25: qty 1

## 2024-02-25 MED ORDER — SIMETHICONE 80 MG PO CHEW
80.0000 mg | CHEWABLE_TABLET | Freq: Four times a day (QID) | ORAL | Status: DC | PRN
  Administered 2024-02-26: 80 mg via ORAL
  Filled 2024-02-25: qty 1

## 2024-02-25 NOTE — Progress Notes (Addendum)
 PROGRESS NOTE        PATIENT DETAILS Name: Angel [name] Age: 73 y.o. Sex: male Date of Birth: 1950/09/30 Admit Date: 02/22/2024 Admitting Physician Dorn Dawson, MD ERE:Ujuz, Izetta, NP  Brief Summary: Patient is a 73 y.o.  male with history of tongue cancer-s/p left neck dissection chemo/radiation in 2015-currently in remission-recent thymic cancer-s/p thymectomy in 2024-PAD, CAD, brief episode of PAF (not on anticoagulation) COPD-who presented with failure to thrive/confusion for at least 2-3 days prior to this hospitalization-on day of hospitalization- he had decreased consciousness-and was difficult to arouse.  He was subsequently brought to the hospital-where he was found to have AKI-and subsequently admitted to the hospitalist service.  Significant events: 10/26>> admit to [facility].  Significant studies: 10/26>> CT chest/abdomen/pelvis: No acute findings. 10/28>> MRI brain: No acute abnormality. 10/28>> vitamin B12: 290 10/28>> vitamin B1: Pending 10/29>> AM cortisol: 9.3  Significant microbiology data: 10/26>> COVID/influenza/RSV PCR: Negative 10/26>> respiratory virus panel: Negative 10/26>> blood culture: No growth  Procedures: None  Consults: None  Subjective: He is now much more awake and alert-knows he is in the hospital-answering questions much more fluently and appropriately.  No complaints overnight.  He is undecided about SNF.  This morning-he acknowledges that he has not taken any of his medications for almost 3 weeks prior to this hospitalization.  Objective: Vitals: Blood pressure (!) 124/98, pulse 60, temperature 98.8 F (37.1 C), temperature source Oral, resp. rate 19, height 5' 5 (1.651 m), weight 53.8 kg, SpO2 100%.   Exam: Gen Exam:Alert awake-not in any distress HEENT:atraumatic, normocephalic Chest: B/L clear to auscultation anteriorly CVS:S1S2 regular Abdomen:soft non tender, non distended Extremities:no pedal  edema-left foot appears to be somewhat swollen-not tender Neurology: Non focal Skin: no rash  Pertinent Labs/Radiology:    Latest Ref Rng & Units 02/24/2024    3:26 AM 02/23/2024    3:23 AM 02/22/2024    1:47 AM  CBC  WBC 4.0 - 10.5 K/uL 6.3  7.8  11.0   Hemoglobin 13.0 - 17.0 g/dL 88.9  88.1  86.4   Hematocrit 39.0 - 52.0 % 33.6  35.5  40.4   Platelets 150 - 400 K/uL 293  283  285     Lab Results  Component Value Date   NA 136 02/25/2024   K 3.5 02/25/2024   CL 99 02/25/2024   CO2 26 02/25/2024      Assessment/Plan: Acute metabolic encephalopathy Probably multifactorial-dehydration/failure to thrive syndrome playing a major role-unclear if there is some psychogenic component (marital stress at home)-dehydration due to poor oral intake-and noncompliance to medications (hypothyroidism) all playing a role in his presenting complaints. Has significantly improved-no longer on IVF-back on his usual medications-suspect he is now close to baseline. B12 on the lower side-continue to supplement Supportive care  Hypotension BP in the 80s systolic when EMS presented at home Etiology likely secondary to dehydration No indication of infection or sepsis physiology so far (ruled out) BP has now stabilized with supportive care Continue to monitor off antibiotics Continue to follow cultures.  Failure to thrive syndrome/dehydration Unclear etiology-significant marital stressors at home (per spouse-they have not been getting along for a while)-suspect some element of hypothyroidism due to noncompliance to Synthroid  (spouse not sure if he is taking his medications as prescribed)  Oral intake has improved No longer on IV fluids Continue Synthroid  AM cortisol stable. Continue  to mobilize with PT/OT-SNF recommended  AKI Likely hemodynamically mediated Resolved  Depression Per spouse-significant stressors at home-she suspects noncompliance with Lexapro  (confirmed with patient on 10/27  afternoon during revisit)-and that patient's depression may be playing a role in his symptoms For now-his seems stable-claims that his mood is now much better this morning-Lexapro herminio has been resumed (confirmed that he has not taken these medications for almost 3 weeks)-since overall improved-holding off on psych consultation-this can be revisited if his symptoms worsen.   Hypothyroidism TSH elevated-some suspicion for noncompliance-see discussion above Continue Synthroid  Recheck TSH in 4 to 6 weeks  Vitamin B12 deficiency Continue supplementation-recheck B12 levels in 4 to 6 weeks.  Left Foot Swelling ?cause Per patient this is chronic Some minimal erythema-not tender Will get plain xray's  Nonobstructive CAD No anginal symptoms Continue Plavix /statin  PAD-s/p PCI to left CIA August 2024 Poor historian but this appears stable Continue Plavix /Crestor .  HTN BP stable-continue metoprolol .  HLD Crestor   COPD Stable-not in exacerbation As needed bronchodilators  Hypokalemia Repleted  GERD PPI  History of thymic carcinoma-s/p resection/adjuvant radiation 2024 History of stage IV tongue cancer-s/p chemoradiation in 2015 Apparently in remission Follows with oncology.  Chronic diffuse pancreatic duct dilatation On outpatient surveillance imaging per oncology.  Nutrition Status: Nutrition Problem: Severe Malnutrition Etiology: social / environmental circumstances Signs/Symptoms: severe muscle depletion, severe fat depletion, energy intake < or equal to 50% for > or equal to 1 month Interventions: Ensure Enlive (each supplement provides 350kcal and 20 grams of protein), MVI    Code status:   Code Status: Full Code   DVT Prophylaxis: enoxaparin  (LOVENOX ) injection 40 mg Start: 02/22/24 0900   Family Communication: Angel [name] [phone] -called-10/29   Disposition Plan: Status is: Inpatient Remains inpatient appropriate because: Severity of  illness   Planned Discharge Destination:Skilled nursing facility   Diet: Diet Order             Diet regular Room service appropriate? No; Fluid consistency: Thin  Diet effective now                     Antimicrobial agents: Anti-infectives (From admission, onward)    Start     Dose/Rate Route Frequency Ordered Stop   02/22/24 0700  ceFEPIme  (MAXIPIME ) 2 g in sodium chloride  0.9 % 100 mL IVPB        2 g 200 mL/hr over 30 Minutes Intravenous  Once 02/22/24 0645 02/22/24 0730   02/22/24 0700  metroNIDAZOLE  (FLAGYL ) IVPB 500 mg  Status:  Discontinued        500 mg 100 mL/hr over 60 Minutes Intravenous  Once 02/22/24 0645 02/22/24 0645   02/22/24 0700  vancomycin  (VANCOCIN ) IVPB 1000 mg/200 mL premix  Status:  Discontinued        1,000 mg 200 mL/hr over 60 Minutes Intravenous  Once 02/22/24 0645 02/22/24 0646   02/22/24 0700  vancomycin  (VANCOREADY) IVPB 1250 mg/250 mL        1,250 mg 166.7 mL/hr over 90 Minutes Intravenous  Once 02/22/24 0646 02/22/24 0904        MEDICATIONS: Scheduled Meds:  clopidogrel   75 mg Oral Daily   cyanocobalamin  1,000 mcg Subcutaneous q1800   enoxaparin  (LOVENOX ) injection  40 mg Subcutaneous Q24H   escitalopram   20 mg Oral Daily   feeding supplement  237 mL Oral BID BM   levothyroxine   100 mcg Oral Q0600   metoprolol  tartrate  12.5 mg Oral BID   multivitamin with minerals  1 tablet Oral Daily   pantoprazole   40 mg Oral Q1200   rosuvastatin   20 mg Oral Daily   traZODone  50 mg Oral QHS   Continuous Infusions:   PRN Meds:.acetaminophen , alum & mag hydroxide-simeth, ipratropium-albuterol , ondansetron  (ZOFRAN ) IV   I have personally reviewed following labs and imaging studies  LABORATORY DATA: CBC: Recent Labs  Lab 02/22/24 0147 02/23/24 0323 02/24/24 0326  WBC 11.0* 7.8 6.3  HGB 13.5 11.8* 11.0*  HCT 40.4 35.5* 33.6*  MCV 92.4 92.0 90.8  PLT 285 283 293    Basic Metabolic Panel: Recent Labs  Lab 02/22/24 0147  02/23/24 0323 02/24/24 0326 02/25/24 0340  NA 138 140 137 136  K 3.8 3.9 3.2* 3.5  CL 102 102 102 99  CO2 22 24 24 26   GLUCOSE 147* 98 100* 93  BUN 15 9 9 16   CREATININE 1.41* 1.05 0.85 0.94  CALCIUM  8.7* 8.9 8.3* 8.7*  MG  --   --   --  2.0    GFR: Estimated Creatinine Clearance: 54.8 mL/min (by C-G formula based on SCr of 0.94 mg/dL).  Liver Function Tests: Recent Labs  Lab 02/22/24 0147  AST 24  ALT 12  ALKPHOS 54  BILITOT 0.9  PROT 6.5  ALBUMIN  3.5   No results for input(s): LIPASE, AMYLASE in the last 168 hours. Recent Labs  Lab 02/23/24 1028  AMMONIA 18    Coagulation Profile: Recent Labs  Lab 02/22/24 0221  INR 1.0    Cardiac Enzymes: No results for input(s): CKTOTAL, CKMB, CKMBINDEX, TROPONINI in the last 168 hours.  BNP (last 3 results) No results for input(s): PROBNP in the last 8760 hours.  Lipid Profile: No results for input(s): CHOL, HDL, LDLCALC, TRIG, CHOLHDL, LDLDIRECT in the last 72 hours.  Thyroid  Function Tests: Recent Labs    02/22/24 1322  FREET4 0.70    Anemia Panel: Recent Labs    02/23/24 1028  VITAMINB12 290    Urine analysis:    Component Value Date/Time   COLORURINE YELLOW 02/22/2024 0549   APPEARANCEUR CLEAR 02/22/2024 0549   LABSPEC 1.033 (H) 02/22/2024 0549   PHURINE 6.0 02/22/2024 0549   GLUCOSEU NEGATIVE 02/22/2024 0549   HGBUR SMALL (A) 02/22/2024 0549   BILIRUBINUR NEGATIVE 02/22/2024 0549   KETONESUR NEGATIVE 02/22/2024 0549   PROTEINUR NEGATIVE 02/22/2024 0549   UROBILINOGEN 0.2 11/03/2013 2303   NITRITE NEGATIVE 02/22/2024 0549   LEUKOCYTESUR NEGATIVE 02/22/2024 0549    Sepsis Labs: Lactic Acid, Venous    Component Value Date/Time   LATICACIDVEN 0.7 02/22/2024 1633    MICROBIOLOGY: Recent Results (from the past 240 hours)  Blood Culture (routine x 2)     Status: None (Preliminary result)   Collection Time: 02/22/24  2:09 AM   Specimen: BLOOD  Result Value Ref  Range Status   Specimen Description BLOOD RIGHT ANTECUBITAL  Final   Special Requests   Final    BOTTLES DRAWN AEROBIC AND ANAEROBIC Blood Culture adequate volume   Culture   Final    NO GROWTH 3 DAYS Performed at [facility], [address] [place], [place] [address]    Report Status PENDING  Incomplete  Blood Culture (routine x 2)     Status: None (Preliminary result)   Collection Time: 02/22/24  2:21 AM   Specimen: BLOOD LEFT ARM  Result Value Ref Range Status   Specimen Description BLOOD LEFT ARM  Final   Special Requests   Final    BOTTLES  DRAWN AEROBIC AND ANAEROBIC Blood Culture adequate volume   Culture   Final    NO GROWTH 3 DAYS Performed at [facility], [address], [place], [place] [address]    Report Status PENDING  Incomplete  Resp panel by RT-PCR (RSV, Flu A&B, Covid) Anterior Nasal Swab     Status: None   Collection Time: 02/22/24  5:50 AM   Specimen: Anterior Nasal Swab  Result Value Ref Range Status   SARS Coronavirus 2 by RT PCR NEGATIVE NEGATIVE Final   Influenza A by PCR NEGATIVE NEGATIVE Final   Influenza B by PCR NEGATIVE NEGATIVE Final    Comment: (NOTE) The Xpert Xpress SARS-CoV-2/FLU/RSV plus assay is intended as an aid in the diagnosis of influenza from Nasopharyngeal swab specimens and should not be used as a sole basis for treatment. Nasal washings and aspirates are unacceptable for Xpert Xpress SARS-CoV-2/FLU/RSV testing.  Fact Sheet for Patients: bloggercourse.com  Fact Sheet for Healthcare Providers: seriousbroker.it  This test is not yet approved or cleared by the [place]  FDA and has been authorized for detection and/or diagnosis of SARS-CoV-2 by FDA under an Emergency Use Authorization (EUA). This EUA will remain in effect (meaning this test can be used) for the duration of the COVID-19 declaration under Section 564(b)(1) of the Act, 21 U.S.C. section  360bbb-3(b)(1), unless the authorization is terminated or revoked.     Resp Syncytial Virus by PCR NEGATIVE NEGATIVE Final    Comment: (NOTE) Fact Sheet for Patients: bloggercourse.com  Fact Sheet for Healthcare Providers: seriousbroker.it  This test is not yet approved or cleared by the [place]  FDA and has been authorized for detection and/or diagnosis of SARS-CoV-2 by FDA under an Emergency Use Authorization (EUA). This EUA will remain in effect (meaning this test can be used) for the duration of the COVID-19 declaration under Section 564(b)(1) of the Act, 21 U.S.C. section 360bbb-3(b)(1), unless the authorization is terminated or revoked.  Performed at [facility], [address] [place], [place] [address]   Respiratory (~20 pathogens) panel by PCR     Status: None   Collection Time: 02/22/24 12:45 PM   Specimen: Nasopharyngeal Swab; Respiratory  Result Value Ref Range Status   Adenovirus NOT DETECTED NOT DETECTED Final   Coronavirus 229E NOT DETECTED NOT DETECTED Final    Comment: (NOTE) The Coronavirus on the Respiratory Panel, DOES NOT test for the novel  Coronavirus (2019 nCoV)    Coronavirus HKU1 NOT DETECTED NOT DETECTED Final   Coronavirus NL63 NOT DETECTED NOT DETECTED Final   Coronavirus OC43 NOT DETECTED NOT DETECTED Final   Metapneumovirus NOT DETECTED NOT DETECTED Final   Rhinovirus / Enterovirus NOT DETECTED NOT DETECTED Final   Influenza A NOT DETECTED NOT DETECTED Final   Influenza B NOT DETECTED NOT DETECTED Final   Parainfluenza Virus 1 NOT DETECTED NOT DETECTED Final   Parainfluenza Virus 2 NOT DETECTED NOT DETECTED Final   Parainfluenza Virus 3 NOT DETECTED NOT DETECTED Final   Parainfluenza Virus 4 NOT DETECTED NOT DETECTED Final   Respiratory Syncytial Virus NOT DETECTED NOT DETECTED Final   Bordetella pertussis NOT DETECTED NOT DETECTED Final   Bordetella Parapertussis NOT DETECTED  NOT DETECTED Final   Chlamydophila pneumoniae NOT DETECTED NOT DETECTED Final   Mycoplasma pneumoniae NOT DETECTED NOT DETECTED Final    Comment: Performed at [facility], [address], [place], [place] [address]    RADIOLOGY STUDIES/RESULTS: MR BRAIN WO CONTRAST Result Date: 02/23/2024  EXAM: MRI BRAIN WITHOUT CONTRAST 02/23/2024 06:15:21 PM TECHNIQUE: Multiplanar multisequence MRI of the head/brain was performed without the administration of intravenous contrast. COMPARISON: Prior CT from 09/16/2023. CLINICAL HISTORY: Mental status change, unknown cause. FINDINGS: BRAIN AND VENTRICLES: No acute infarct. No intracranial hemorrhage. No mass. No midline shift. No hydrocephalus. Diffuse ventricular prominence most likely related to underlying atrophy without hydrocephalus. No extra-axial fluid collection. Generalized age-related atrophy. Patchy T2 FLAIR hyperintensity involving the periventricular and deep white matter of multiple cerebral hemispheres as well as the pons, most characteristic of chronic microvascular ischemic disease, moderate to advanced in nature. Few remote lacunar infarcts present about the deep gray nuclei. The sellar/suprasellar regions appear unremarkable. Normal flow voids. Loss of normal flow void within the right vertebral artery, which could reflect slow flow and/or occlusion. ORBITS: No acute abnormality. Probable remote posttraumatic defect at the right lamina papyracea noted. SINUSES AND MASTOIDS: Small right mastoid effusion noted, of indeterminate significance. BONES AND SOFT TISSUES: Normal marrow signal. No acute soft tissue abnormality. Degenerative spondylosis noted at C4-C5 without significant spinal stenosis. IMPRESSION: 1. No acute intracranial abnormality. 2. Atrophy with moderate to advanced chronic microvascular ischemic disease, with few remote lacunar infarcts about the deep gray nuclei. 3. Loss of normal flow void within the right vertebral artery, which  could reflect slow flow or occlusion. Electronically signed by: Morene Hoard MD 02/23/2024 08:58 PM EDT RP Workstation: [id number]     LOS: 3 days   Donalda Applebaum, MD  [facility]    To contact the attending provider between 7A-7P or the covering provider during after hours 7P-7A, please log into the web site [email or web address] and access using universal Twilight password for that web site. If you do not have the password, please call the hospital operator.  02/25/2024, 10:58 AM

## 2024-02-25 NOTE — Progress Notes (Signed)
 Occupational Therapy Treatment Patient Details Name: Angel [name] MRN: [id number] DOB: May 24, 1950 Today's Date: 02/25/2024   History of present illness 73 y.o.  male who presented with failure to thrive/confusion for at least 2-3 days prior to this hospitalization-on day of hospitalization- he had decreased consciousness-and was difficult to arouse, He was subsequently brought to the hospital-where he was found to have AKI.PMH of tongue cancer-s/p left neck dissection chemo/radiation in 2015-currently in remission-recent thymic cancer-s/p thymectomy in 2024-PAD, CAD, brief episode of PAF (not on anticoagulation) COPD-   OT comments  Pt greeted in supine, agreeable for OT visit. AOX4. Pt able to transfer in/out of bed with mod I and was no more than close supervision/CGA for functional transfers + ambulation via RW. Completed LB dressing, toileting, and grooming with close sup/CGA. Endorsed 7/10 exertion level via RPE, indicating reduced activity tolerance. Educated pt on energy conservation strategies, activity pacing, and falls prevention/safe home setup in preparation for d/c home. OT to continue to follow acutely, updating recommendation for home with HHOT. MD updated.      If plan is discharge home, recommend the following:  A little help with bathing/dressing/bathroom;Assistance with cooking/housework;Assist for transportation;Help with stairs or ramp for entrance   Equipment Recommendations  None recommended by OT    Recommendations for Other Services      Precautions / Restrictions Precautions Precautions: Fall Recall of Precautions/Restrictions: Intact Restrictions Weight Bearing Restrictions Per Provider Order: No       Mobility Bed Mobility Overal bed mobility: Modified Independent             General bed mobility comments: exited to the L side, HOB slightly elevated    Transfers Overall transfer level: Needs assistance Equipment used: Rolling walker (2  wheels) Transfers: Sit to/from Stand Sit to Stand: Supervision           General transfer comment: self-corrects on proper hand placement during transfers from various surfaces     Balance Overall balance assessment: Mild deficits observed, not formally tested                                         ADL either performed or assessed with clinical judgement   ADL Overall ADL's : Needs assistance/impaired Eating/Feeding: Independent   Grooming: Contact guard assist;Standing;Wash/dry hands               Lower Body Dressing: Contact guard assist;Sitting/lateral leans Lower Body Dressing Details (indicate cue type and reason): donned B socks and sneakers via hip hike technique Toilet Transfer: Contact guard assist;Ambulation;Regular Toilet;Rolling walker (2 wheels)   Toileting- Clothing Manipulation and Hygiene: Contact guard assist;Sitting/lateral lean;Sit to/from stand       Functional mobility during ADLs: Contact guard assist;Rolling walker (2 wheels)      Extremity/Trunk Assessment Upper Extremity Assessment Upper Extremity Assessment: Generalized weakness            Vision       Perception     Praxis     Communication Communication Communication: No apparent difficulties   Cognition Arousal: Alert Behavior During Therapy: WFL for tasks assessed/performed Cognition: No apparent impairments             OT - Cognition Comments: mildly forgetful                 Following commands: Intact        Cueing  Cueing Techniques: Verbal cues, Visual cues  Exercises      Shoulder Instructions       General Comments VSS throughout on RA    Pertinent Vitals/ Pain       Pain Assessment Pain Assessment: No/denies pain  Home Living                                          Prior Functioning/Environment              Frequency  Min 2X/week        Progress Toward Goals  OT Goals(current goals  can now be found in the care plan section)  Progress towards OT goals: Progressing toward goals     Plan      Co-evaluation                 AM-PAC OT 6 Clicks Daily Activity     Outcome Measure   Help from another person eating meals?: None Help from another person taking care of personal grooming?: A Little Help from another person toileting, which includes using toliet, bedpan, or urinal?: A Little Help from another person bathing (including washing, rinsing, drying)?: A Little Help from another person to put on and taking off regular upper body clothing?: None Help from another person to put on and taking off regular lower body clothing?: None 6 Click Score: 21    End of Session Equipment Utilized During Treatment: Rolling walker (2 wheels)  OT Visit Diagnosis: Unsteadiness on feet (R26.81);Other abnormalities of gait and mobility (R26.89);Muscle weakness (generalized) (M62.81)   Activity Tolerance Patient tolerated treatment well   Patient Left  (seated on toilet, RN aware)   Nurse Communication Mobility status        Time: 8593-8578 OT Time Calculation (min): 15 min  Charges: OT General Charges $OT Visit: 1 Visit OT Treatments $Self Care/Home Management : 8-22 mins  Little Winton D., MSOT, OTR/L Acute Rehabilitation Services [phone] Secure Chat Preferred  Rikki Milch 02/25/2024, 5:25 PM

## 2024-02-25 NOTE — Progress Notes (Signed)
 TRH night cross cover note:   I was notified by the patient's RN that the patient's blood pressure is elevated this evening, with most recent systolic blood pressures in the 190s to low 200s, with corresponding heart rates in the 60s.  Additionally, the patient is complaining of some gassy abdominal discomfort.   Per brief chart review, it appears that this patient who was admitted with suspected acute metabolic encephalopathy and was initially hypotensive earlier during this hospitalization.  I subsequently added prn iv hydralazine  for SBP greater than 180 mmHg, and also added prn simethicone  for gassy abdominal pain.     Eva Pore, DO Hospitalist

## 2024-02-25 NOTE — TOC Transition Note (Signed)
 Transition of Care [facility]) - Discharge Note   Patient Details  Name: Angel [name] MRN: [id number] Date of Birth: [birth year]  [facility] CM/SW Contact:  Marval Gell, RN Phone Number: 02/25/2024, 12:04 PM   Clinical Narrative:     Beatris w patients wife and she would like patient to DC to home w home health services. Plan for DC tomorrow. No preference for provider, [facility] able to accept, added to AVS. She states that they have RW WC and BSC at home. She states that her self or neighbor will be able to pick up, requested early DC, updated MD.      Barriers to Discharge: Continued Medical Work up, English As A Second Language Teacher   Patient Goals and CMS Choice Patient states their goals for this hospitalization and ongoing recovery are:: to go home CMS Medicare.gov Compare Post Acute Care list provided to:: Other (Comment Required) Choice offered to / list presented to : Spouse [place] ownership interest in [facility].provided to:: Patient    Discharge Placement                       Discharge Plan and Services Additional resources added to the After Visit Summary for   In-house Referral: Clinical Social Work   Post Acute Care Choice: Home Health          DME Arranged: N/A         HH Arranged: PT, OT, Nurse's Aide HH Agency: [facility] Date [facility] Agency Contacted: 02/25/24 Time HH Agency Contacted: 1204 Representative spoke with at [facility] Agency: Amy  Social Drivers of Health (SDOH) Interventions SDOH Screenings   Food Insecurity: No Food Insecurity (02/22/2024)  Housing: Unknown (02/22/2024)  Transportation Needs: No Transportation Needs (02/22/2024)  Utilities: Not At Risk (02/22/2024)  Depression (PHQ2-9): Low Risk  (04/09/2023)  Social Connections: Socially Integrated (02/22/2024)  Tobacco Use: Medium Risk (02/22/2024)     Readmission Risk Interventions    03/07/2023    1:51 PM  Readmission Risk Prevention Plan  Transportation  Screening Complete  Home Care Screening Complete  Medication Review (RN CM) Complete

## 2024-02-25 NOTE — Plan of Care (Signed)

## 2024-02-25 NOTE — Care Management Important Message (Signed)
 Important Message  Patient Details  Name: Angel [name] MRN: [id number] Date of Birth: 12-Aug-1950   Important Message Given:  Yes - Medicare IM     Claretta Deed 02/25/2024, 3:06 PM

## 2024-02-25 NOTE — Progress Notes (Signed)
 Physical Therapy Treatment Patient Details Name: Angel [name] MRN: [id number] DOB: 03-26-1951 Today's Date: 02/25/2024   History of Present Illness 73 y.o.  male who presented with failure to thrive/confusion for at least 2-3 days prior to this hospitalization-on day of hospitalization- he had decreased consciousness-and was difficult to arouse, He was subsequently brought to the hospital-where he was found to have AKI.PMH of tongue cancer-s/p left neck dissection chemo/radiation in 2015-currently in remission-recent thymic cancer-s/p thymectomy in 2024-PAD, CAD, brief episode of PAF (not on anticoagulation) COPD-   PT Comments  Pt received in supine and agreeable to PT session. Pt progressed in today's session by needing less assistance to ambulate. Pt was able to ambulate 214ft with RW and supervision/CGA for safety. Worked on LE strengthening with pt requiring B UE support for standing exercises. At this point, pt is feeling comfortable d/c home with assist from wife. Recommending return home with HHPT to address functional deficits and improve mobility. Acute PT to follow.     If plan is discharge home, recommend the following: Assistance with cooking/housework;Assist for transportation;Help with stairs or ramp for entrance   Can travel by private vehicle     Yes        Precautions / Restrictions Precautions Precautions: Fall Recall of Precautions/Restrictions: Intact Restrictions Weight Bearing Restrictions Per Provider Order: No     Mobility  Bed Mobility Overal bed mobility: Modified Independent   Transfers Overall transfer level: Needs assistance Equipment used: Rolling walker (2 wheels) Transfers: Sit to/from Stand Sit to Stand: Supervision    General transfer comment: for safety, frequent cueing for proper hand placement with poor carryover    Ambulation/Gait Ambulation/Gait assistance: Contact guard assist, Supervision Gait Distance (Feet): 200 Feet Assistive  device: Rolling walker (2 wheels) Gait Pattern/deviations: Step-through pattern, Trunk flexed, Wide base of support, Decreased stride length Gait velocity: decreased     General Gait Details: CGA/supervision when ambulating with pt preferring to look down at his feet. Cueing to look forward     Balance Overall balance assessment: Needs assistance Sitting-balance support: No upper extremity supported, Feet supported Sitting balance-Leahy Scale: Good     Standing balance support: Reliant on assistive device for balance, Bilateral upper extremity supported, During functional activity Standing balance-Leahy Scale: Poor Standing balance comment: reliant on UE support         Communication Communication Communication: No apparent difficulties  Cognition Arousal: Alert Behavior During Therapy: WFL for tasks assessed/performed   PT - Cognitive impairments: No apparent impairments    Following commands: Intact      Cueing Cueing Techniques: Verbal cues  Exercises General Exercises - Lower Extremity Straight Leg Raises: AROM, Both, Supine (2 reps) Other Exercises Other Exercises: x20 standing marches with light B UE support Other Exercises: x10 sit<>stand repetitions with B UE support        Pertinent Vitals/Pain Pain Assessment Pain Assessment: No/denies pain     PT Goals (current goals can now be found in the care plan section) Acute Rehab PT Goals Patient Stated Goal: to go home PT Goal Formulation: With patient Time For Goal Achievement: 03/08/24 Potential to Achieve Goals: Good Progress towards PT goals: Progressing toward goals    Frequency    Min 2X/week       AM-PAC PT 6 Clicks Mobility   Outcome Measure  Help needed turning from your back to your side while in a flat bed without using bedrails?: None Help needed moving from lying on your back to sitting on the  side of a flat bed without using bedrails?: None Help needed moving to and from a bed to  a chair (including a wheelchair)?: A Little Help needed standing up from a chair using your arms (e.g., wheelchair or bedside chair)?: A Little Help needed to walk in hospital room?: A Little Help needed climbing 3-5 steps with a railing? : A Little 6 Click Score: 20    End of Session Equipment Utilized During Treatment: Gait belt Activity Tolerance: Patient tolerated treatment well Patient left: in bed;with call bell/phone within reach;with bed alarm set Nurse Communication: Mobility status PT Visit Diagnosis: Unsteadiness on feet (R26.81);Other abnormalities of gait and mobility (R26.89);Muscle weakness (generalized) (M62.81)     Time: 8669-8648 PT Time Calculation (min) (ACUTE ONLY): 21 min  Charges:    $Gait Training: 8-22 mins PT General Charges $$ ACUTE PT VISIT: 1 Visit                    Kate ORN, PT, DPT Secure Chat Preferred  Rehab Office [phone]   Kate BRAVO Wendolyn 02/25/2024, 2:40 PM

## 2024-02-26 ENCOUNTER — Telehealth: Payer: Self-pay

## 2024-02-26 ENCOUNTER — Other Ambulatory Visit (HOSPITAL_COMMUNITY): Payer: Self-pay

## 2024-02-26 DIAGNOSIS — I480 Paroxysmal atrial fibrillation: Secondary | ICD-10-CM

## 2024-02-26 DIAGNOSIS — R404 Transient alteration of awareness: Secondary | ICD-10-CM | POA: Diagnosis not present

## 2024-02-26 DIAGNOSIS — R7989 Other specified abnormal findings of blood chemistry: Secondary | ICD-10-CM | POA: Diagnosis not present

## 2024-02-26 DIAGNOSIS — E43 Unspecified severe protein-calorie malnutrition: Secondary | ICD-10-CM | POA: Diagnosis not present

## 2024-02-26 LAB — VITAMIN B1: Vitamin B1 (Thiamine): 130.3 nmol/L (ref 66.5–200.0)

## 2024-02-26 MED ORDER — VITAMIN B-12 1000 MCG PO TABS
1000.0000 ug | ORAL_TABLET | Freq: Every day | ORAL | 2 refills | Status: AC
Start: 2024-02-26 — End: 2024-05-26
  Filled 2024-02-26: qty 30, 30d supply, fill #0

## 2024-02-26 MED ORDER — METOPROLOL TARTRATE 25 MG PO TABS: 25.0000 mg | ORAL_TABLET | Freq: Two times a day (BID) | ORAL | Status: DC

## 2024-02-26 MED ORDER — METOPROLOL SUCCINATE ER 50 MG PO TB24
50.0000 mg | ORAL_TABLET | Freq: Every day | ORAL | 2 refills | Status: AC
  Filled 2024-02-26: qty 30, 30d supply, fill #0

## 2024-02-26 NOTE — TOC Transition Note (Signed)
 Transition of Care [facility]) - Discharge Note   Patient Details  Name: Angel [name] MRN: [id number] Date of Birth: Apr 08, 1951  [facility]) CM/SW Contact:  Tom-Johnson, Harvest Muskrat, RN Phone Number: 02/26/2024, 9:50 AM   Clinical Narrative:     Patient is scheduled for discharge today.  Readmission Risk Assessment done. Home health info, Outpatient f/u, hospital f/u and discharge instructions on AVS. Prescriptions sent to [facility] pharmacy and patient will receive meds prior discharge. CM spoke with patient at bedside and wife, Deane via phone. Deane states she will transport patient home but if she is not able to pick him up, patient's sister Jori will transport.  No further ICM needs noted.    Final next level of care: Home w Home Health Services Barriers to Discharge: Barriers Resolved   Patient Goals and CMS Choice Patient states their goals for this hospitalization and ongoing recovery are:: To return home CMS Medicare.gov Compare Post Acute Care list provided to:: Patient Choice offered to / list presented to : Patient, Spouse [place] ownership interest in [facility].provided to:: Patient    Discharge Placement                Patient to be transferred to facility by: Wife, Dickey or sister, Jori Name of family member notified: Forensic Scientist and Services Additional resources added to the After Visit Summary for   In-house Referral: Clinical Social Work   Post Acute Care Choice: Home Health          DME Arranged: N/A         HH Arranged: PT, OT, Nurse's Aide HH Agency: [facility] Date [facility] Agency Contacted: 02/25/24 Time HH Agency Contacted: 1204 Representative spoke with at [facility] Agency: Amy  Social Drivers of Health (SDOH) Interventions SDOH Screenings   Food Insecurity: No Food Insecurity (02/22/2024)  Housing: Unknown (02/22/2024)  Transportation Needs: No Transportation Needs (02/22/2024)  Utilities: Not  At Risk (02/22/2024)  Depression (PHQ2-9): Low Risk  (04/09/2023)  Social Connections: Socially Integrated (02/22/2024)  Tobacco Use: Medium Risk (02/22/2024)     Readmission Risk Interventions    02/26/2024    9:48 AM 03/07/2023    1:51 PM  Readmission Risk Prevention Plan  Transportation Screening Complete Complete  PCP or Specialist Appt within 3-5 Days Complete   Home Care Screening  Complete  Medication Review (RN CM)  Complete  HRI or Home Care Consult Complete   Social Work Consult for Recovery Care Planning/Counseling Complete   Palliative Care Screening Not Applicable   Medication Review Oceanographer) Referral to Pharmacy

## 2024-02-26 NOTE — Telephone Encounter (Signed)
 Attempted to call both #'s listed. Voicemail full and unable to leave a message.  Wife called back and given message from Dr. Lonn that the recent CT looks fine. Wife verbalized understanding. Scheduled appts in 6 months with labs, wife aware and will mail schedule as requested. Verified address.

## 2024-02-26 NOTE — Plan of Care (Signed)

## 2024-02-26 NOTE — Discharge Summary (Signed)
 PATIENT DETAILS Name: Angel [name] Age: 73 y.o. Sex: male Date of Birth: 25-Jul-1950 MRN: [id number]. Admitting Physician: Angel Dawson, MD [name]:Ujuz, Izetta, NP  Admit Date: 02/22/2024 Discharge date: 02/26/2024  Recommendations for Outpatient Follow-up:  Follow up with PCP in 1-2 weeks Please obtain CMP/CBC in one week Repeat vitamin B12 levels in 6 weeks-if still low-will need parenteral supplementation. Repeat TSH levels in 6 weeks-see below. May need outpatient psychiatry evaluation-see below.  Admitted From:  Home  Disposition: Home health   Discharge Condition: good  CODE STATUS:   Code Status: Full Code   Diet recommendation:  Diet Order             Diet - low sodium heart healthy           Diet regular Room service appropriate? No; Fluid consistency: Thin  Diet effective now                    Brief Summary: Patient is a 73 y.o.  male with history of tongue cancer-s/p left neck dissection chemo/radiation in 2015-currently in remission-recent thymic cancer-s/p thymectomy in 2024-PAD, CAD, brief episode of PAF (not on anticoagulation) COPD-who presented with failure to thrive/confusion for at least 2-3 days prior to this hospitalization-on day of hospitalization- he had decreased consciousness-and was difficult to arouse.  He was subsequently brought to the hospital-where he was found to have AKI-and subsequently admitted to the hospitalist service.   Significant events: 10/26>> admit to [facility].   Significant studies: 10/26>> CT chest/abdomen/pelvis: No acute findings. 10/28>> MRI brain: No acute abnormality. 10/28>> vitamin B12: 290 10/28>> vitamin B1:130.3 10/29>> AM cortisol: 9.3   Significant microbiology data: 10/26>> COVID/influenza/RSV PCR: Negative 10/26>> respiratory virus panel: Negative 10/26>> blood culture: No growth  Brief Hospital Course: Acute metabolic encephalopathy Probably multifactorial-dehydration/failure to thrive  syndrome playing a major role-unclear if there is some psychogenic component (marital stress at home)-dehydration due to poor oral intake-and noncompliance to medications (hypothyroidism) all playing a role in his presenting complaints. Has significantly improved-no longer on IVF-back on his usual medications-suspect he is now close to baseline. B12 on the lower side-continue to supplement   Hypotension BP in the 80s systolic when EMS presented at home Etiology likely secondary to dehydration No indication of infection or sepsis physiology so far (ruled out) BP has now stabilized with supportive care Continue to monitor off antibiotics All cultures negative to date-see above.   Failure to thrive syndrome/dehydration Unclear etiology-significant marital stressors at home (per spouse-they have not been getting along for a while)-suspect some element of hypothyroidism due to noncompliance to Synthroid  (spouse not sure if he is taking his medications as prescribed).  He also has not been taking any of his depression medications for the past several weeks. He was admitted and given IV fluids-he was restarted on Synthroid  and his antidepression medications-with these measures-he has stabilized-eating almost all of his meals-ambulating with physical therapy.  Initially SNF was contemplated-however both patient/spouse now want to go home with home health services.   AKI Likely hemodynamically mediated Resolved   Depression Per spouse-significant stressors at home-she suspects noncompliance with Lexapro  (confirmed with patient on 10/27 afternoon during revisit-has not been taking medications for at least 3 weeks if not longer)-and that patient's depression may be playing a role in his symptoms For now-his seems stable-claims that his mood is now much better this morning-Lexapro herminio has been resumed -since overall improved-holding off on psych consultation-this can be revisited if his symptoms  worsen.  Hypothyroidism TSH elevated-some suspicion for noncompliance-see discussion above Continue Synthroid  Recheck TSH in 4 to 6 weeks   Vitamin B12 deficiency Continue supplementation-recheck B12 levels in 4 to 6 weeks.   Left Foot Swelling ?cause Per patient this is chronic Pain x-ray without any acute abnormalities Already improving with supportive care-he may have had unintentionally hit some object. Ambulating well-at baseline-none tender-minimal erythema-swelling already improved compared to what it was 2 days back.   Nonobstructive CAD No anginal symptoms Continue Plavix /statin   PAD-s/p PCI to left CIA August 2024 Poor historian but this appears stable Continue Plavix /Crestor .   HTN BP stable this morning but elevated last night-metoprolol  dosage has been adjusted.   HLD Crestor    COPD Stable-not in exacerbation As needed bronchodilators   Hypokalemia Repleted   GERD PPI   History of thymic carcinoma-s/p resection/adjuvant radiation 2024 History of stage IV tongue cancer-s/p chemoradiation in 2015 Apparently in remission Follows with oncology.   Chronic diffuse pancreatic duct dilatation On outpatient surveillance imaging per oncology.   Nutrition Status: Nutrition Problem: Severe Malnutrition Etiology: social / environmental circumstances Signs/Symptoms: severe muscle depletion, severe fat depletion, energy intake < or equal to 50% for > or equal to 1 month Interventions: Ensure Enlive (each supplement provides 350kcal and 20 grams of protein), MVI     Discharge Diagnoses:  Principal Problem:   Sepsis (HCC) Active Problems:   Protein-calorie malnutrition, severe   Discharge Instructions:  Activity:  As tolerated with Full fall precautions use walker/cane & assistance as needed  Discharge Instructions     Call MD for:  difficulty breathing, headache or visual disturbances   Complete by: As directed    Call MD for:  extreme fatigue    Complete by: As directed    Call MD for:  persistant nausea and vomiting   Complete by: As directed    Diet - low sodium heart healthy   Complete by: As directed    Discharge instructions   Complete by: As directed    Follow with Primary MD  Angel Pagan, NP in 1-2 weeks  Please get a complete blood count and chemistry panel checked by your Primary MD at your next visit, and again as instructed by your Primary MD.  Get Medicines reviewed and adjusted: Please take all your medications with you for your next visit with your Primary MD  Laboratory/radiological data: Please request your Primary MD to go over all hospital tests and procedure/radiological results at the follow up, please ask your Primary MD to get all Hospital records sent to his/her office.  In some cases, they will be blood work, cultures and biopsy results pending at the time of your discharge. Please request that your primary care M.D. follows up on these results.  Also Note the following: If you experience worsening of your admission symptoms, develop shortness of breath, life threatening emergency, suicidal or homicidal thoughts you must seek medical attention immediately by calling 911 or calling your MD immediately  if symptoms less severe.  You must read complete instructions/literature along with all the possible adverse reactions/side effects for all the Medicines you take and that have been prescribed to you. Take any new Medicines after you have completely understood and accpet all the possible adverse reactions/side effects.   Do not drive when taking Pain medications or sleeping medications (Benzodaizepines)  Do not take more than prescribed Pain, Sleep and Anxiety Medications. It is not advisable to combine anxiety,sleep and pain medications without talking with your primary care practitioner  Special Instructions: If you have smoked or chewed Tobacco  in the last 2 yrs please stop smoking, stop any regular  Alcohol   and or any Recreational drug use.  Wear Seat belts while driving.  Please note: You were cared for by a hospitalist during your hospital stay. Once you are discharged, your primary care physician will handle any further medical issues. Please note that NO REFILLS for any discharge medications will be authorized once you are discharged, as it is imperative that you return to your primary care physician (or establish a relationship with a primary care physician if you do not have one) for your post hospital discharge needs so that they can reassess your need for medications and monitor your lab values.   Increase activity slowly   Complete by: As directed       Allergies as of 02/26/2024   No Known Allergies      Medication List     TAKE these medications    albuterol  108 (90 Base) MCG/ACT inhaler Commonly known as: VENTOLIN  HFA Inhale 2 puffs into the lungs every 6 (six) hours as needed for wheezing or shortness of breath.   clopidogrel  75 MG tablet Commonly known as: PLAVIX  TAKE 1 TABLET EVERY DAY   cyanocobalamin 1000 MCG tablet Commonly known as: VITAMIN B12 Take 1 tablet (1,000 mcg total) by mouth daily.   escitalopram  20 MG tablet Commonly known as: LEXAPRO  Take 20 mg by mouth daily.   ferrous sulfate  325 (65 FE) MG EC tablet Take 1 tablet (325 mg total) by mouth 2 (two) times daily.   levothyroxine  100 MCG tablet Commonly known as: SYNTHROID  Take 100 mcg by mouth daily.   metoprolol  succinate 50 MG 24 hr tablet Commonly known as: Toprol  XL Take 1 tablet (50 mg total) by mouth daily. What changed:  medication strength how much to take   multivitamin with minerals Tabs tablet Take 1 tablet by mouth daily. Centrum 50+   rosuvastatin  20 MG tablet Commonly known as: CRESTOR  Take 1 tablet by mouth once daily   traMADol  50 MG tablet Commonly known as: ULTRAM  Take 50 mg by mouth 4 (four) times daily as needed.   traZODone 50 MG tablet Commonly  known as: DESYREL Take 50 mg by mouth at bedtime.   Vitamin D3 50 MCG (2000 UT) Tabs Take 2,000 Units by mouth daily.        Follow-up Information     [facility] Follow up.   Why: for home health services they will call in 1-2 days to set up your home health appointment Contact information: [address] [place] [address] [phone]         Angel Pagan, NP. Schedule an appointment as soon as possible for a visit in 1 week(s).   Contact information: [address] [place] [address] [place] [address] [phone]                No Known Allergies   Other Procedures/Studies: DG Foot 2 Views Left Result Date: 02/25/2024 CLINICAL DATA:  Left foot pain and swelling. EXAM: LEFT FOOT - 2 VIEW COMPARISON:  None Available. FINDINGS: No acute fracture or dislocation. The bones are osteopenic. The soft tissues are unremarkable. IMPRESSION: 1. No acute fracture or dislocation. 2. Osteopenia. Electronically Signed   By: Vanetta Chou M.D.   On: 02/25/2024 19:52   MR BRAIN WO CONTRAST Result Date: 02/23/2024 EXAM: MRI BRAIN WITHOUT CONTRAST 02/23/2024 06:15:21 PM TECHNIQUE: Multiplanar  multisequence MRI of the head/brain was performed without the administration of intravenous contrast. COMPARISON: Prior CT from 09/16/2023. CLINICAL HISTORY: Mental status change, unknown cause. FINDINGS: BRAIN AND VENTRICLES: No acute infarct. No intracranial hemorrhage. No mass. No midline shift. No hydrocephalus. Diffuse ventricular prominence most likely related to underlying atrophy without hydrocephalus. No extra-axial fluid collection. Generalized age-related atrophy. Patchy T2 FLAIR hyperintensity involving the periventricular and deep white matter of multiple cerebral hemispheres as well as the pons, most characteristic of chronic microvascular ischemic disease, moderate to advanced in nature. Few remote lacunar infarcts present about the deep gray nuclei.  The sellar/suprasellar regions appear unremarkable. Normal flow voids. Loss of normal flow void within the right vertebral artery, which could reflect slow flow and/or occlusion. ORBITS: No acute abnormality. Probable remote posttraumatic defect at the right lamina papyracea noted. SINUSES AND MASTOIDS: Small right mastoid effusion noted, of indeterminate significance. BONES AND SOFT TISSUES: Normal marrow signal. No acute soft tissue abnormality. Degenerative spondylosis noted at C4-C5 without significant spinal stenosis. IMPRESSION: 1. No acute intracranial abnormality. 2. Atrophy with moderate to advanced chronic microvascular ischemic disease, with few remote lacunar infarcts about the deep gray nuclei. 3. Loss of normal flow void within the right vertebral artery, which could reflect slow flow or occlusion. Electronically signed by: Morene Hoard MD 02/23/2024 08:58 PM EDT RP Workstation: [id number]   CT CHEST ABDOMEN PELVIS W CONTRAST Result Date: 02/22/2024 EXAM: CT CHEST, ABDOMEN AND PELVIS WITH CONTRAST 02/22/2024 03:22:29 AM TECHNIQUE: CT of the chest, abdomen and pelvis was performed with the administration of 75 mL of iohexol  (OMNIPAQUE ) 350 MG/ML injection. Multiplanar reformatted images are provided for review. Automated exposure control, iterative reconstruction, and/or weight based adjustment of the mA/kV was utilized to reduce the radiation dose to as low as reasonably achievable. COMPARISON: 08/11/2023 CLINICAL HISTORY: Sepsis. sepsis. FINDINGS: CHEST: MEDIASTINUM AND LYMPH NODES: Heart and pericardium are unremarkable. Mild 3-vessel coronary atherosclerosis. The central airways are clear. Postprocedural changes along the anterior mediastinum related to prior thymic carcinoma resection, with stable residual complex postsurgical fluid/seroma. Mild thoracic atherosclerosis. No mediastinal, hilar or axillary lymphadenopathy. LUNGS AND PLEURA: Mild centrilobular emphysematous changes. Mild  scarring/radiation changes in the anterior left upper lobe. Mild scarring/atelectasis in the left lower lobe. No focal consolidation or pulmonary edema. No pleural effusion or pneumothorax. ABDOMEN AND PELVIS: LIVER: The liver is unremarkable. GALLBLADDER AND BILE DUCTS: Gallbladder is unremarkable. No biliary ductal dilatation. SPLEEN: No acute abnormality. PANCREAS: No acute abnormality. ADRENAL GLANDS: No acute abnormality. KIDNEYS, URETERS AND BLADDER: No stones in the kidneys or ureters. No hydronephrosis. No perinephric or periureteral stranding. Urinary bladder is unremarkable. GI AND BOWEL: Stomach demonstrates no acute abnormality. There is no bowel obstruction. Normal appendix (image 92). REPRODUCTIVE ORGANS: The prostate is unremarkable. PERITONEUM AND RETROPERITONEUM: No ascites. No free air. VASCULATURE: Aorta is normal in caliber. Atherosclerotic calcifications of the abdominal aorta and branch vessels, although patent. Occluded right SFA, chronic. ABDOMINAL AND PELVIS LYMPH NODES: No lymphadenopathy. BONES AND SOFT TISSUES: Degenerative changes of the visualized thoracolumbar spine. No acute osseous abnormality. No focal soft tissue abnormality. Post-surgical changes beneath the left anterior abdominal wall. Post-surgical changes related to prior left inguinal hernia repair. IMPRESSION: 1. No acute findings. 2. Stable postsurgical changes related to prior thymic carcinoma resection, as above. 3. Additional stable ancillary findings, as above. Electronically signed by: Pinkie Pebbles MD 02/22/2024 03:29 AM EDT RP Workstation: [id number]   DG Chest Port 1 View Result Date: 02/22/2024 EXAM: 1 VIEW XRAY OF THE CHEST 02/22/2024  02:30:00 AM COMPARISON: Comparison made to chest radiograph of April 21, 2023. CLINICAL HISTORY: Questionable sepsis - evaluate for abnormality. Encounter for Questionable sepsis - evaluate for abnormality FINDINGS: LUNGS AND PLEURA: Left lung volume loss. Heterogeneous  opacities overlying left mid lung zone, favored to represent combination of scarring and surgical staples. Surgical staples overlying left mid lung zone. No focal pulmonary opacity. No pulmonary edema. No pleural effusion. No pneumothorax. HEART AND MEDIASTINUM: Cardiac loop recorder overlying left chest wall. Aortic arch calcifications. No acute abnormality of the cardiac and mediastinal silhouettes. BONES AND SOFT TISSUES: No acute osseous abnormality. IMPRESSION: 1. Left lung volume loss with heterogeneous opacities in the left mid lung zone, favored to represent combination of scarring and surgical staples. Electronically signed by: Dorethia Molt MD 02/22/2024 02:40 AM EDT RP Workstation: [id number]     TODAY-DAY OF DISCHARGE:  Subjective:   Carlin Che today has no headache,no chest abdominal pain,no new weakness tingling or numbness, feels much better wants to go home today.   Objective:   Blood pressure 104/68, pulse 68, temperature 98.5 F (36.9 C), temperature source Oral, resp. rate 18, height 5' 5 (1.651 m), weight 53.8 kg, SpO2 95%.  Intake/Output Summary (Last 24 hours) at 02/26/2024 0838 Last data filed at 02/26/2024 0600 Gross per 24 hour  Intake 150 ml  Output 500 ml  Net -350 ml   Filed Weights   02/22/24 0124  Weight: 53.8 kg    Exam: Awake Alert, Oriented *3, No new F.N deficits, Normal affect [place].AT,PERRAL Supple Neck,No JVD, No cervical lymphadenopathy appriciated.  Symmetrical Chest wall movement, Good air movement bilaterally, CTAB RRR,No Gallops,Rubs or new Murmurs, No Parasternal Heave +ve B.Sounds, Abd Soft, Non tender, No organomegaly appriciated, No rebound -guarding or rigidity. No Cyanosis, Clubbing or edema, No new Rash or bruise   PERTINENT RADIOLOGIC STUDIES: DG Foot 2 Views Left Result Date: 02/25/2024 CLINICAL DATA:  Left foot pain and swelling. EXAM: LEFT FOOT - 2 VIEW COMPARISON:  None Available. FINDINGS: No acute fracture or dislocation.  The bones are osteopenic. The soft tissues are unremarkable. IMPRESSION: 1. No acute fracture or dislocation. 2. Osteopenia. Electronically Signed   By: Vanetta Chou M.D.   On: 02/25/2024 19:52     PERTINENT LAB RESULTS: CBC: Recent Labs    02/24/24 0326  WBC 6.3  HGB 11.0*  HCT 33.6*  PLT 293   CMET CMP     Component Value Date/Time   NA 136 02/25/2024 0340   NA 143 10/28/2022 0846   NA 140 02/14/2017 0851   K 3.5 02/25/2024 0340   K 5.1 02/14/2017 0851   CL 99 02/25/2024 0340   CO2 26 02/25/2024 0340   CO2 27 02/14/2017 0851   GLUCOSE 93 02/25/2024 0340   GLUCOSE 117 02/14/2017 0851   BUN 16 02/25/2024 0340   BUN 4 (L) 10/28/2022 0846   BUN 7.9 02/14/2017 0851   CREATININE 0.94 02/25/2024 0340   CREATININE 1.08 08/11/2023 1030   CREATININE 0.9 02/14/2017 0851   CALCIUM  8.7 (L) 02/25/2024 0340   CALCIUM  9.7 02/14/2017 0851   PROT 6.5 02/22/2024 0147   PROT 7.4 02/14/2017 0851   ALBUMIN  3.5 02/22/2024 0147   ALBUMIN  4.3 02/14/2017 0851   AST 24 02/22/2024 0147   AST 28 08/11/2023 1030   AST 18 02/14/2017 0851   ALT 12 02/22/2024 0147   ALT 14 08/11/2023 1030   ALT 9 02/14/2017 0851   ALKPHOS 54 02/22/2024 0147   ALKPHOS 59 02/14/2017 0851  BILITOT 0.9 02/22/2024 0147   BILITOT 0.4 08/11/2023 1030   BILITOT 0.33 02/14/2017 0851   EGFR 74 10/28/2022 0846   GFRNONAA >60 02/25/2024 0340   GFRNONAA >60 08/11/2023 1030    GFR Estimated Creatinine Clearance: 54.8 mL/min (by C-G formula based on SCr of 0.94 mg/dL). No results for input(s): LIPASE, AMYLASE in the last 72 hours. No results for input(s): CKTOTAL, CKMB, CKMBINDEX, TROPONINI in the last 72 hours. Invalid input(s): POCBNP No results for input(s): DDIMER in the last 72 hours. No results for input(s): HGBA1C in the last 72 hours. No results for input(s): CHOL, HDL, LDLCALC, TRIG, CHOLHDL, LDLDIRECT in the last 72 hours. No results for input(s): TSH, T4TOTAL,  T3FREE, THYROIDAB in the last 72 hours.  Invalid input(s): FREET3 Recent Labs    02/23/24 1028  VITAMINB12 290   Coags: No results for input(s): INR in the last 72 hours.  Invalid input(s): PT Microbiology: Recent Results (from the past 240 hours)  Blood Culture (routine x 2)     Status: None (Preliminary result)   Collection Time: 02/22/24  2:09 AM   Specimen: BLOOD  Result Value Ref Range Status   Specimen Description BLOOD RIGHT ANTECUBITAL  Final   Special Requests   Final    BOTTLES DRAWN AEROBIC AND ANAEROBIC Blood Culture adequate volume   Culture   Final    NO GROWTH 4 DAYS Performed at [facility], [address], [place], [place] [address]    Report Status PENDING  Incomplete  Blood Culture (routine x 2)     Status: None (Preliminary result)   Collection Time: 02/22/24  2:21 AM   Specimen: BLOOD LEFT ARM  Result Value Ref Range Status   Specimen Description BLOOD LEFT ARM  Final   Special Requests   Final    BOTTLES DRAWN AEROBIC AND ANAEROBIC Blood Culture adequate volume   Culture   Final    NO GROWTH 4 DAYS Performed at [facility], [address] [place], [place] [address]    Report Status PENDING  Incomplete  Resp panel by RT-PCR (RSV, Flu A&B, Covid) Anterior Nasal Swab     Status: None   Collection Time: 02/22/24  5:50 AM   Specimen: Anterior Nasal Swab  Result Value Ref Range Status   SARS Coronavirus 2 by RT PCR NEGATIVE NEGATIVE Final   Influenza A by PCR NEGATIVE NEGATIVE Final   Influenza B by PCR NEGATIVE NEGATIVE Final    Comment: (NOTE) The Xpert Xpress SARS-CoV-2/FLU/RSV plus assay is intended as an aid in the diagnosis of influenza from Nasopharyngeal swab specimens and should not be used as a sole basis for treatment. Nasal washings and aspirates are unacceptable for Xpert Xpress SARS-CoV-2/FLU/RSV testing.  Fact Sheet for Patients: bloggercourse.com  Fact Sheet for Healthcare  Providers: seriousbroker.it  This test is not yet approved or cleared by the [place]  FDA and has been authorized for detection and/or diagnosis of SARS-CoV-2 by FDA under an Emergency Use Authorization (EUA). This EUA will remain in effect (meaning this test can be used) for the duration of the COVID-19 declaration under Section 564(b)(1) of the Act, 21 U.S.C. section 360bbb-3(b)(1), unless the authorization is terminated or revoked.     Resp Syncytial Virus by PCR NEGATIVE NEGATIVE Final    Comment: (NOTE) Fact Sheet for Patients: bloggercourse.com  Fact Sheet for Healthcare Providers: seriousbroker.it  This test is not yet approved or cleared by the [place]  FDA and has been authorized  for detection and/or diagnosis of SARS-CoV-2 by FDA under an Emergency Use Authorization (EUA). This EUA will remain in effect (meaning this test can be used) for the duration of the COVID-19 declaration under Section 564(b)(1) of the Act, 21 U.S.C. section 360bbb-3(b)(1), unless the authorization is terminated or revoked.  Performed at [facility], [address], [place], [place] [address]   Respiratory (~20 pathogens) panel by PCR     Status: None   Collection Time: 02/22/24 12:45 PM   Specimen: Nasopharyngeal Swab; Respiratory  Result Value Ref Range Status   Adenovirus NOT DETECTED NOT DETECTED Final   Coronavirus 229E NOT DETECTED NOT DETECTED Final    Comment: (NOTE) The Coronavirus on the Respiratory Panel, DOES NOT test for the novel  Coronavirus (2019 nCoV)    Coronavirus HKU1 NOT DETECTED NOT DETECTED Final   Coronavirus NL63 NOT DETECTED NOT DETECTED Final   Coronavirus OC43 NOT DETECTED NOT DETECTED Final   Metapneumovirus NOT DETECTED NOT DETECTED Final   Rhinovirus / Enterovirus NOT DETECTED NOT DETECTED Final   Influenza A NOT DETECTED NOT DETECTED Final   Influenza B NOT DETECTED  NOT DETECTED Final   Parainfluenza Virus 1 NOT DETECTED NOT DETECTED Final   Parainfluenza Virus 2 NOT DETECTED NOT DETECTED Final   Parainfluenza Virus 3 NOT DETECTED NOT DETECTED Final   Parainfluenza Virus 4 NOT DETECTED NOT DETECTED Final   Respiratory Syncytial Virus NOT DETECTED NOT DETECTED Final   Bordetella pertussis NOT DETECTED NOT DETECTED Final   Bordetella Parapertussis NOT DETECTED NOT DETECTED Final   Chlamydophila pneumoniae NOT DETECTED NOT DETECTED Final   Mycoplasma pneumoniae NOT DETECTED NOT DETECTED Final    Comment: Performed at [facility], [address] [place], [place] [address]    FURTHER DISCHARGE INSTRUCTIONS:  Get Medicines reviewed and adjusted: Please take all your medications with you for your next visit with your Primary MD  Laboratory/radiological data: Please request your Primary MD to go over all hospital tests and procedure/radiological results at the follow up, please ask your Primary MD to get all Hospital records sent to his/her office.  In some cases, they will be blood work, cultures and biopsy results pending at the time of your discharge. Please request that your primary care M.D. goes through all the records of your hospital data and follows up on these results.  Also Note the following: If you experience worsening of your admission symptoms, develop shortness of breath, life threatening emergency, suicidal or homicidal thoughts you must seek medical attention immediately by calling 911 or calling your MD immediately  if symptoms less severe.  You must read complete instructions/literature along with all the possible adverse reactions/side effects for all the Medicines you take and that have been prescribed to you. Take any new Medicines after you have completely understood and accpet all the possible adverse reactions/side effects.   Do not drive when taking Pain medications or sleeping medications (Benzodaizepines)  Do not take  more than prescribed Pain, Sleep and Anxiety Medications. It is not advisable to combine anxiety,sleep and pain medications without talking with your primary care practitioner  Special Instructions: If you have smoked or chewed Tobacco  in the last 2 yrs please stop smoking, stop any regular Alcohol   and or any Recreational drug use.  Wear Seat belts while driving.  Please note: You were cared for by a hospitalist during your hospital stay. Once you are discharged, your primary care physician will handle any further medical  issues. Please note that NO REFILLS for any discharge medications will be authorized once you are discharged, as it is imperative that you return to your primary care physician (or establish a relationship with a primary care physician if you do not have one) for your post hospital discharge needs so that they can reassess your need for medications and monitor your lab values.  Total Time spent coordinating discharge including counseling, education and face to face time equals greater than 30 minutes.  Signed: Donalda Applebaum 02/26/2024 8:38 AM

## 2024-02-26 NOTE — Telephone Encounter (Signed)
 Wife called and left a message. Desmon has been in the hospital and was discharged today. She is canceling lab appt for tomorrow and CT scheduled for tomorrow. He had labs at the hospital and CT on 10/26 while admitted. Wife asking if you can review when chart when you have time. Appts for tomorrow canceled.

## 2024-02-26 NOTE — Telephone Encounter (Signed)
 Ct looks fine Can you reschedule his appt with me for 6 months? With labs Thanks

## 2024-02-26 NOTE — Plan of Care (Signed)

## 2024-02-27 ENCOUNTER — Telehealth: Payer: Self-pay

## 2024-02-27 ENCOUNTER — Inpatient Hospital Stay

## 2024-02-27 ENCOUNTER — Ambulatory Visit (HOSPITAL_COMMUNITY)

## 2024-02-27 LAB — CULTURE, BLOOD (ROUTINE X 2)
Culture: NO GROWTH
Culture: NO GROWTH
Special Requests: ADEQUATE
Special Requests: ADEQUATE

## 2024-02-27 NOTE — Transitions of Care (Post Inpatient/ED Visit) (Signed)
   02/27/2024  Name: Angel [name] MRN: [id number] DOB: 06-01-1950  Today's TOC FU Call Status: Today's TOC FU Call Status:: Unsuccessful Call (1st Attempt) Unsuccessful Call (1st Attempt) Date: 02/27/24  Attempted to reach the patient regarding the most recent Inpatient/ED visit.  Follow Up Plan: Additional outreach attempts will be made to reach the patient to complete the Transitions of Care (Post Inpatient/ED visit) call.   Benjie Ricketson J. Kamel Haven RN, MSN [facility], [facility] RN Care Manager Direct Dial: [phone]  Fax: [phone] Website: [email or web address]

## 2024-03-01 ENCOUNTER — Telehealth: Payer: Self-pay

## 2024-03-01 NOTE — Transitions of Care (Post Inpatient/ED Visit) (Signed)
   03/01/2024  Name: Angel [name] MRN: [id number] DOB: 1950-10-06  Today's TOC FU Call Status: Today's TOC FU Call Status:: Unsuccessful Call (3rd Attempt) Unsuccessful Call (3rd Attempt) Date: 03/01/24  Attempted to reach the patient regarding the most recent Inpatient/ED visit.  Follow Up Plan: No further outreach attempts will be made at this time. We have been unable to contact the patient.  Breeann Reposa J. Novah Goza RN, MSN [facility], [facility] RN Care Manager Direct Dial: [phone]  Fax: [phone] Website: [email or web address]

## 2024-03-01 NOTE — Transitions of Care (Post Inpatient/ED Visit) (Signed)
   03/01/2024  Name: Angel [name] MRN: [id number] DOB: 1950-09-05  Today's TOC FU Call Status: Today's TOC FU Call Status:: Unsuccessful Call (2nd Attempt) Unsuccessful Call (2nd Attempt) Date: 03/01/24  Attempted to reach the patient regarding the most recent Inpatient/ED visit.  Follow Up Plan: Additional outreach attempts will be made to reach the patient to complete the Transitions of Care (Post Inpatient/ED visit) call.    Delaila Nand J. Aasia Peavler RN, MSN [facility], [facility] RN Care Manager Direct Dial: [phone]  Fax: [phone] Website: [email or web address]

## 2024-03-09 ENCOUNTER — Inpatient Hospital Stay: Admitting: Hematology and Oncology

## 2024-03-16 ENCOUNTER — Encounter

## 2024-03-17 DIAGNOSIS — Z09 Encounter for follow-up examination after completed treatment for conditions other than malignant neoplasm: Secondary | ICD-10-CM | POA: Diagnosis not present

## 2024-03-17 DIAGNOSIS — E538 Deficiency of other specified B group vitamins: Secondary | ICD-10-CM | POA: Diagnosis not present

## 2024-03-17 DIAGNOSIS — E039 Hypothyroidism, unspecified: Secondary | ICD-10-CM | POA: Diagnosis not present

## 2024-03-17 DIAGNOSIS — F418 Other specified anxiety disorders: Secondary | ICD-10-CM | POA: Diagnosis not present

## 2024-03-18 ENCOUNTER — Encounter

## 2024-04-16 ENCOUNTER — Encounter

## 2024-04-19 ENCOUNTER — Ambulatory Visit

## 2024-04-19 DIAGNOSIS — R55 Syncope and collapse: Secondary | ICD-10-CM

## 2024-04-20 LAB — CUP PACEART REMOTE DEVICE CHECK
Date Time Interrogation Session: 20251221054103
Implantable Pulse Generator Implant Date: 20240625
Pulse Gen Model: 5000
Pulse Gen Serial Number: 511029626

## 2024-04-21 NOTE — Progress Notes (Signed)
 Remote Loop Recorder Transmission

## 2024-04-25 ENCOUNTER — Ambulatory Visit: Payer: Self-pay | Admitting: Cardiology

## 2024-05-17 ENCOUNTER — Encounter

## 2024-05-20 ENCOUNTER — Ambulatory Visit

## 2024-05-20 DIAGNOSIS — R55 Syncope and collapse: Secondary | ICD-10-CM

## 2024-05-20 LAB — CUP PACEART REMOTE DEVICE CHECK
Date Time Interrogation Session: 20260121052611
Implantable Pulse Generator Implant Date: 20240625
Pulse Gen Model: 5000
Pulse Gen Serial Number: 511029626

## 2024-05-22 NOTE — Progress Notes (Signed)
 Remote Loop Recorder Transmission

## 2024-05-24 ENCOUNTER — Ambulatory Visit: Payer: Self-pay | Admitting: Cardiology

## 2024-05-26 ENCOUNTER — Other Ambulatory Visit: Payer: Self-pay

## 2024-05-26 DIAGNOSIS — E7800 Pure hypercholesterolemia, unspecified: Secondary | ICD-10-CM

## 2024-05-27 MED ORDER — ROSUVASTATIN CALCIUM 20 MG PO TABS: 20.0000 mg | ORAL_TABLET | Freq: Every day | ORAL | 1 refills | Status: AC

## 2024-06-17 ENCOUNTER — Encounter

## 2024-06-21 ENCOUNTER — Encounter

## 2024-07-18 ENCOUNTER — Encounter

## 2024-07-22 ENCOUNTER — Encounter

## 2024-08-26 ENCOUNTER — Inpatient Hospital Stay

## 2024-08-26 ENCOUNTER — Inpatient Hospital Stay: Admitting: Hematology and Oncology
